# Patient Record
Sex: Male | Born: 1935
Health system: Southern US, Community
[De-identification: ages and names within clinical notes are randomized; demographics above are authoritative.]

## PROBLEM LIST (undated history)

## (undated) DIAGNOSIS — M1990 Unspecified osteoarthritis, unspecified site: Secondary | ICD-10-CM

## (undated) DIAGNOSIS — K219 Gastro-esophageal reflux disease without esophagitis: Secondary | ICD-10-CM

## (undated) DIAGNOSIS — J45909 Unspecified asthma, uncomplicated: Secondary | ICD-10-CM

## (undated) DIAGNOSIS — J189 Pneumonia, unspecified organism: Secondary | ICD-10-CM

## (undated) DIAGNOSIS — E785 Hyperlipidemia, unspecified: Secondary | ICD-10-CM

## (undated) DIAGNOSIS — Z7901 Long term (current) use of anticoagulants: Secondary | ICD-10-CM

## (undated) DIAGNOSIS — I824Y1 Acute embolism and thrombosis of unspecified deep veins of right proximal lower extremity: Secondary | ICD-10-CM

## (undated) DIAGNOSIS — I10 Essential (primary) hypertension: Secondary | ICD-10-CM

## (undated) DIAGNOSIS — Z87442 Personal history of urinary calculi: Secondary | ICD-10-CM

## (undated) HISTORY — DX: Long term (current) use of anticoagulants: Z79.01

## (undated) HISTORY — PX: ELBOW BURSA SURGERY: SHX615

## (undated) HISTORY — DX: Hyperlipidemia, unspecified: E78.5

## (undated) HISTORY — PX: TONSILLECTOMY: SUR1361

## (undated) HISTORY — PX: KIDNEY STONE SURGERY: SHX686

## (undated) HISTORY — DX: Gastro-esophageal reflux disease without esophagitis: K21.9

## (undated) HISTORY — DX: Essential (primary) hypertension: I10

## (undated) HISTORY — PX: JOINT REPLACEMENT: SHX530

## (undated) HISTORY — DX: Acute embolism and thrombosis of unspecified deep veins of right proximal lower extremity: I82.4Y1

---

## 1999-09-02 ENCOUNTER — Ambulatory Visit (HOSPITAL_COMMUNITY): Admission: RE | Admit: 1999-09-02 | Discharge: 1999-09-02 | Payer: Self-pay | Admitting: Internal Medicine

## 1999-09-02 ENCOUNTER — Encounter: Payer: Self-pay | Admitting: Internal Medicine

## 2001-04-01 ENCOUNTER — Encounter (INDEPENDENT_AMBULATORY_CARE_PROVIDER_SITE_OTHER): Payer: Self-pay | Admitting: Specialist

## 2001-04-01 ENCOUNTER — Other Ambulatory Visit: Admission: RE | Admit: 2001-04-01 | Discharge: 2001-04-01 | Payer: Self-pay | Admitting: Gastroenterology

## 2001-10-19 ENCOUNTER — Encounter: Payer: Self-pay | Admitting: Internal Medicine

## 2001-10-19 ENCOUNTER — Encounter: Admission: RE | Admit: 2001-10-19 | Discharge: 2001-10-19 | Payer: Self-pay | Admitting: Internal Medicine

## 2005-06-23 ENCOUNTER — Inpatient Hospital Stay (HOSPITAL_COMMUNITY): Admission: RE | Admit: 2005-06-23 | Discharge: 2005-06-24 | Payer: Self-pay | Admitting: Urology

## 2005-06-23 ENCOUNTER — Encounter (INDEPENDENT_AMBULATORY_CARE_PROVIDER_SITE_OTHER): Payer: Self-pay | Admitting: *Deleted

## 2006-03-23 ENCOUNTER — Ambulatory Visit (HOSPITAL_BASED_OUTPATIENT_CLINIC_OR_DEPARTMENT_OTHER): Admission: RE | Admit: 2006-03-23 | Discharge: 2006-03-23 | Payer: Self-pay | Admitting: Urology

## 2006-06-10 ENCOUNTER — Ambulatory Visit: Payer: Self-pay | Admitting: Internal Medicine

## 2006-06-16 ENCOUNTER — Ambulatory Visit: Payer: Self-pay | Admitting: Cardiology

## 2006-06-23 ENCOUNTER — Ambulatory Visit: Payer: Self-pay | Admitting: Internal Medicine

## 2006-06-23 ENCOUNTER — Encounter (INDEPENDENT_AMBULATORY_CARE_PROVIDER_SITE_OTHER): Payer: Self-pay | Admitting: Specialist

## 2006-07-02 ENCOUNTER — Ambulatory Visit: Payer: Self-pay | Admitting: Cardiology

## 2006-07-02 ENCOUNTER — Ambulatory Visit: Payer: Self-pay

## 2006-07-02 ENCOUNTER — Encounter: Payer: Self-pay | Admitting: Cardiology

## 2006-12-31 ENCOUNTER — Encounter: Admission: RE | Admit: 2006-12-31 | Discharge: 2006-12-31 | Payer: Self-pay | Admitting: Orthopedic Surgery

## 2007-02-06 ENCOUNTER — Ambulatory Visit: Payer: Self-pay | Admitting: *Deleted

## 2007-02-06 ENCOUNTER — Inpatient Hospital Stay (HOSPITAL_COMMUNITY): Admission: AD | Admit: 2007-02-06 | Discharge: 2007-02-11 | Payer: Self-pay | Admitting: Internal Medicine

## 2007-02-07 ENCOUNTER — Ambulatory Visit: Payer: Self-pay | Admitting: Infectious Disease

## 2008-06-01 HISTORY — PX: FOOT SURGERY: SHX648

## 2008-06-07 ENCOUNTER — Ambulatory Visit: Payer: Self-pay | Admitting: Cardiology

## 2008-06-08 ENCOUNTER — Ambulatory Visit: Payer: Self-pay

## 2008-06-18 ENCOUNTER — Encounter: Admission: RE | Admit: 2008-06-18 | Discharge: 2008-06-18 | Payer: Self-pay | Admitting: Orthopedic Surgery

## 2008-06-20 ENCOUNTER — Ambulatory Visit (HOSPITAL_BASED_OUTPATIENT_CLINIC_OR_DEPARTMENT_OTHER): Admission: RE | Admit: 2008-06-20 | Discharge: 2008-06-21 | Payer: Self-pay | Admitting: Orthopedic Surgery

## 2008-09-03 ENCOUNTER — Encounter: Admission: RE | Admit: 2008-09-03 | Discharge: 2008-09-18 | Payer: Self-pay | Admitting: Orthopedic Surgery

## 2008-10-15 ENCOUNTER — Ambulatory Visit: Payer: Self-pay | Admitting: Internal Medicine

## 2008-10-30 ENCOUNTER — Ambulatory Visit: Payer: Self-pay | Admitting: Internal Medicine

## 2008-11-27 ENCOUNTER — Ambulatory Visit: Payer: Self-pay | Admitting: Internal Medicine

## 2008-12-07 ENCOUNTER — Ambulatory Visit: Payer: Self-pay | Admitting: Internal Medicine

## 2009-01-07 ENCOUNTER — Ambulatory Visit: Payer: Self-pay | Admitting: Internal Medicine

## 2009-02-26 ENCOUNTER — Ambulatory Visit: Payer: Self-pay | Admitting: Internal Medicine

## 2009-04-01 HISTORY — PX: CATARACT EXTRACTION W/ INTRAOCULAR LENS  IMPLANT, BILATERAL: SHX1307

## 2009-04-05 ENCOUNTER — Ambulatory Visit: Payer: Self-pay | Admitting: Internal Medicine

## 2009-05-13 ENCOUNTER — Ambulatory Visit: Payer: Self-pay | Admitting: Internal Medicine

## 2009-05-22 ENCOUNTER — Encounter (INDEPENDENT_AMBULATORY_CARE_PROVIDER_SITE_OTHER): Payer: Self-pay | Admitting: *Deleted

## 2009-06-06 ENCOUNTER — Ambulatory Visit: Payer: Self-pay | Admitting: Internal Medicine

## 2009-06-21 ENCOUNTER — Ambulatory Visit: Payer: Self-pay | Admitting: Internal Medicine

## 2009-07-25 ENCOUNTER — Ambulatory Visit: Payer: Self-pay | Admitting: Internal Medicine

## 2009-08-27 ENCOUNTER — Ambulatory Visit: Payer: Self-pay | Admitting: Internal Medicine

## 2009-08-27 ENCOUNTER — Encounter: Admission: RE | Admit: 2009-08-27 | Discharge: 2009-08-27 | Payer: Self-pay | Admitting: Internal Medicine

## 2009-11-12 ENCOUNTER — Ambulatory Visit: Payer: Self-pay | Admitting: Internal Medicine

## 2010-01-13 ENCOUNTER — Telehealth: Payer: Self-pay | Admitting: Internal Medicine

## 2010-03-06 ENCOUNTER — Ambulatory Visit: Payer: Self-pay | Admitting: Internal Medicine

## 2010-03-18 ENCOUNTER — Ambulatory Visit: Payer: Self-pay | Admitting: Internal Medicine

## 2010-04-01 ENCOUNTER — Ambulatory Visit: Payer: Self-pay | Admitting: Internal Medicine

## 2010-07-01 NOTE — Progress Notes (Signed)
Summary: Schedule ECL  Phone Note Outgoing Call Call back at [facility] Phone [phone]   Call placed by: Bernita Buffy CMA Deborra Medina),  January 13, 2010 1:19 PM Call placed to: Patient Summary of Call: Left message on patients machine to call back. patient is due for a ECL Initial call taken by: Bernita Buffy CMA Deborra Medina),  January 13, 2010 1:19 PM  Follow-up for Phone Call        patient states that he is going to be out of town and will call back to schedule. Follow-up by: Bernita Buffy CMA Deborra Medina),  January 20, 2010 9:25 AM

## 2010-08-11 ENCOUNTER — Encounter: Payer: Self-pay | Admitting: Internal Medicine

## 2010-09-15 LAB — BASIC METABOLIC PANEL
BUN: 19 mg/dL (ref 6–23)
CO2: 28 mEq/L (ref 19–32)
GFR calc Af Amer: >60 mL/min (ref >60)
GFR calc Af Amer: >60 mL/min (ref >60)
GFR calc non Af Amer: >60 mL/min (ref >60)
Glucose, Bld: 179 mg/dL — ABNORMAL HIGH (ref 70–99)
Sodium: 135 mEq/L (ref 135–145)

## 2010-10-14 NOTE — Assessment & Plan Note (Signed)
[facility] OFFICE NOTE   MAHDI, HALTIWANGER                     MRN:          [id number]  DATE:06/07/2008                            DOB:          [birth year]    Mr. Foutch is here for a followup.  Specifically, he was scheduled to  have right first and second transmetatarsal fusion, bunion resection,  and hammer toe correction.  This procedure was scheduled as a 2-1/2 hour  procedure.  They obtained an electrocardiogram, which demonstrated right  bundle-branch block, a new finding from before.  They asked Dr. Caryl Comes  about it, and he told them to cancel the surgery and schedule up an  appointment to see me.  Other than the foot, the patient has been  getting along well.  He has had no major symptoms.  He has not had chest  pain.  He did have a brief episode of dizziness approximately 3 days  ago, but it quickly resolved.  The exact etiology of this was unknown.  The patient has been previously   INCOMPLETE DICTATION.     Loretha Brasil. Lia Foyer, MD, [facility]     TDS/MedQ  DD: 06/14/2008  DT: 06/14/2008  Job #: [id number]

## 2010-10-14 NOTE — Op Note (Signed)
Roberto Reid, Roberto Reid              ACCOUNT NO.:  [id number]   MEDICAL RECORD NO.:  [id number]          PATIENT TYPE:  AMB   LOCATION:  [place]                          FACILITY:  [facility]   PHYSICIAN:  Weber Cooks, M.D.     DATE OF BIRTH:  Jan 31, 1933   DATE OF PROCEDURE:  06/20/2008  DATE OF DISCHARGE:                               OPERATIVE REPORT   PREOPERATIVE DIAGNOSES:  1. 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  2. Right second tarsometatarsal joint posttraumatic arthritis.  3. Right tight gastroc.  4. Right hallux valgus.  5. Right second metatarsalgia.  6. Right second hammer toe.   POSTOPERATIVE DIAGNOSES:  1. 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  2. Right second tarsometatarsal joint posttraumatic arthritis.  3. Right tight gastroc.  4. Right hallux valgus.  5. Right second metatarsalgia.  6. Right second hammer toe.   OPERATIONS:  1. Right first TMT joint fusion with osteotomy.  2. Right local bone graft.  3. Stress x-rays, right foot.  4. Right second TMT joint fusion without osteotomy.  5. Right gastroc slide.  6. Right modified McBride bunionectomy.  7. Right great toe digital nerve neurolysis.  8. Right second Weil metatarsal shortening osteotomy.  9. Right second toe MTP joint capsulotomy with collateral release.  10.Right second toe proximal phalanx head resection.  11.Right second toe FDL to proximal phalanx tendon transfer.  12.Right second toe EDB to EDL tendon transfer.   ANESTHESIA:  General.   SURGEON:  Weber Cooks, MD   ASSISTANT:  Erskine Emery, PA-C.   TOURNIQUET TIME:  Two hours.   COMPLICATIONS:  None.   DISPOSITION:  Stable to the PR.   INDICATIONS:  This is a 77 year old male with longstanding right  forefoot pain and it was interfering with his life to the point of where  he could not  do what he wanted to do after being stepped on by a horse  several years ago.  He was consented to the above procedure.  All risks  including infection, vessel injury, nonunion, malunion, hardware tissue,  hardware failure, persistent pain, worse pain, prolonged recovery,  stiffness, arthritis, recurrence of hallux valgus, development of hallux  varus and development of cock-up toe deformity were all explained.  Questions were encouraged and answered.   OPERATION:  The patient was brought to the operating room, placed in  supine position.  After adequate general endotracheal anesthesia was  administered as well as Ancef 1 g IV piggyback, the right lower  extremity was then prepped, draped in sterile manner, proximally thigh  tourniquet.  A longitudinal incision was made over the medial aspect of  gastrocnemius muscle tendinous junction.  Dissection was carried down  through skin.  Hemostasis was obtained.  Fascia was opened in line with  the incision.  Conjoined region was then developed between the gastroc  soleus.  Soft tissue was then elevated off the posterior aspect of the  gastrocnemius.  Sural nerves identified and protected posteriorly.  Gastrocnemius then released with a curved Mayo scissors.  This had  excellent release of the gastrocnemius.  The areas copiously irrigated  with normal saline.  Subcu was closed with 3-0 Vicryl.  Skin was closed  4-0 nylon.  Sterile dressing was applied.  The wound was closed with 4-0  nylon stitch.  The limb was then gravity exsanguinated.  Tourniquet  elevated to 290 mmHg and a midline incision was made medial to the right  great toe MTP joint.  Dissection was carried down through skin.  Hemostasis was obtained.  The dorsomedial digital nerve of the great toe  was then carefully dissected out and a formal digital nerve neurolysis  then performed of the nerve.  Once this was retracted of harm's way  throughout the case, an L-shaped capsulotomy  was then made.  Simple  bunionectomy was then performed with a sagittal saw.  Yvone Neu Johnson's  ridge was then rounded off with a rongeur.  Lateral capsule was then  released with a curved Beaver blade protecting the cartilaginous surface  with a Soil scientist.  This had an external release of the tight  capsule.  We then copiously irrigated the joint area with normal saline,  and we will return back later to reconstruct the capsule, once the first  and second TMT joint fusions were performed.  We then made a  longitudinal incision midline between EHL and EHB.  Dissection was  carried down through the skin.  Hemostasis was obtained.  First and  second TMT joints were entered by a soft tissue elevation off the dorsal  surface and then entering the joints each respectively.  There was gross  deformity to the first TMT joint with a dysplastic surface to the medial  cuneiform.  I removed the cartilage were both articular surfaces of the  first and second TMT joints respectively using curved corners osteotome  curette as well as synovectomy rongeur.  Once this was completely  denuded of cartilage, we then using a sagittal saw create an osteotomy  closing wedge type of the medial cuneiform.  We also did osteotomy at  the base of the first metatarsal to create congruent joint surfaces.  This was a multiplantar osteotomy not only in valgus, but also for  slight plantar flexion as well.  Once this was done, multiple 2-mm drill  holes were placed on either side of the joints of the first and second  TMT joints respectively and the base of second medially as well as the  lateral aspect of the medial cuneiform.  We then reduced the first TMT  joint with a two-point reduction clamp, and we then used a burr to  create a notch of the base of first metatarsal.  We then placed a 3.5-mm  fully threaded cortical lag screw using a 2.5 mm drill hole  respectively, this had excellent purchase maintenance of the  correction.  We then placed cramp from the base of second metatarsal to the medial  cuneiform and reduced the second TMT joint and placed this under  advanced compression.  We then placed an incision over the medial aspect  of the medial cuneiform and then placed a 3.5-mm fully-threaded cortical  set screw using a 2.5-mm drill hole respectively, this had excellent  purchase and maintenance of the compression across the second TMT joint  fusion site.  We then made another incision at the base of first  metatarsal.  We then placed a 3.5-mm fully-threaded cortical D  rotational screw/compression  screw using a 2.5-mm hole respectively  after two-point reduction clamp was applied.  This had excellent  purchase and maintenance of the compression and alignment of the  corrected first TMT joint.  We then obtained stress x-rays in AP and  lateral planes.  No gross motion, fixation, proposition, excellent  alignment as well.  We then applied stress strain relieving local bone  graft at the first and second TMT joint using a burr as well as the  graft obtained throughout the procedure including from the spurs as well  as from the drill bits.  Once we completed the first and second TMT  joint fusion and applied the graft and performed a stress x-rays, we  then made a longitudinal incision on dorsal aspect of the right second  toe.  Dissection was carried down through the skin.  Hemostasis was  obtained.  EDB and EDL tendons were identified and EDL tendon was  tenotomized proximal medial and the brevis distal lateral and retracted  out of harm's way throughout the procedure.  MTP joint dorsal  capsulotomy with collateral release was then performed with a 15-blade  scalpel protecting the soft tissues both medial and laterally.  We then  skeletonized the distal aspect of the second proximal phalanx and then  with a rongeur followed by bone cutter performed a proximal phalanx head  resection.  This cut  was made perpendicular to long axis proximal  phalanx.  We then made a longitudinal incision into the base of plantar  plate.  We then identified the FPL tendon tenotomized this as distal as  possible.  We then plantar flexed this second toe and with stacked  sagittal saw blades performed a Weil metatarsal shortening osteotomy  osteotomizing the metatarsal head parallel to the plantar aspect of the  foot.  The head was then translated 3-4 mm proximally and fixed with a  1.5-mm fully-threaded cortical set screw using 1.1-mm drill hole  respectively.  This had excellent purchase and maintenance of the  correction.  Redundant bone ledge dorsally was trimmed off with a  rongeur.  Bone wax was applied to expose bone surfaces.  We then  transferred the FDL to proximal phalanx using 3-0 Maxon stitch.  This  had an Systems analyst.  We then placed a 0.054 K-wire antegrade  through the middle distal phalanx reduced PIP joint and fired this  retrograde across the MTP joint.  This held the toe in excellent  position.  We then transferred the EDB to EDL tendon dorsally using  again 3-0 PDS stitch.  This had an Systems analyst.  Tourniquet was  applied.  Hemostasis was obtained.  Toes pinked up nicely.  Skin  relieving incisions were made on either side of K-wire.  K-wire was  bent, cut, and capped.  Subcu was closed with 3-0 Vicryl.  The wounds  were copiously with normal saline.  Subcu was closed with 3-0 Vicryl.  Skin was closed with 4-0 nylon over all wounds.  Sterile dressing  applied, modified Jones dressing was applied.  The patient was stable to  the PR.      Weber Cooks, M.D.  Electronically Signed     PB/MEDQ  D:  06/20/2008  T:  06/21/2008  Job:  [id number]

## 2010-10-14 NOTE — Assessment & Plan Note (Signed)
Roberto Reid OFFICE NOTE   ARBOR, BOLOSAN                     MRN:          [id number]  DATE:06/07/2008                            DOB:          18-Jul-1932    Roberto Reid is here.  He was scheduled to undergo foot surgery  consisting of transmetatarsal fusions, bunion and hammertoe correction.  He had an EKG done at [facility], and this was faxed to  Dr. Caryl Reid, who was doctor of the day.  He encouraged them to cancel this  procedure, and have him see me in follow-up.  I last saw Roberto Reid in  January 2008.  The patient overall has done well.  He had very brief  episode of dizziness  a couple of days ago.  But actually has been  feeling well.  Has had no real syncope or presyncope.  The EKG  shows a  right bundle branch block.  Previous tracings done here in 2007 did not  demonstrate right bundle branch block in the past.  The patient has had  previous radionuclide exercise imaging here.  This also has not  demonstrated exercise-induced bundle branch block previously.  As a  result, he was brought back in for preoperative evaluation.  His overall  ability to do things is reasonably good.  He does not have chest pain.  He has no heart failure symptoms.  He has not had a significant murmur  in the past.  In fact, the patient last had 2-D echocardiography in  February 2008.  At that time he had a mildly calcified aortic valve with  normal regional wall motion.  There was mild increase in wall thickness.  There was fibrocalcific change in the aortic root and mild mitral  annular calcification, otherwise unremarkable tracings.   PAST MEDICAL HISTORY:  Past medical history is remarkable for:  1. Hypertension.  2. Hyperlipidemia.   ALLERGIES:  SULFA.   MEDICATIONS:  1. Lipitor 40 mg daily.  2. Amlodipine 5 mg daily.  3. Zetia 10 mg daily.  4. Nexium 40 mg daily.  5. Hyzaar 100/25 daily.  6.  Potassium 2 tablets three times a day.   FAMILY HISTORY:  His family history is remarkable for diabetes in his  father.  Otherwise is negative for heart disease.   SOCIAL HISTORY:  The patient is married.  He is two children.  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  He is not a current smoker.  He does not chew tobacco.  He only drinks socially.   REVIEW OF SYSTEMS:  The patient did have some shortness of breath  approximately 2 years ago.  That has largely resolved.   PHYSICAL EXAMINATION:  GENERAL:  Alert and oriented in no distress.  VITAL SIGNS:  The blood pressure is 150/78, pulse was 89.  NECK:  The jugular veins are not distended.  The neck is supple.  Carotid upstrokes are brisk.  LUNGS:  The lungs are clear to auscultation and percussion.  CARDIAC:  PMI  is nondisplaced.  There is an S4 gallop.  EXTREMITIES:  There is no obvious extremity edema.   EKG reveals sinus rhythm with right bundle branch block.   IMPRESSION:  1. New right bundle branch block by electrocardiography.  2. History of hypertension  3. Hypercholesterolemia.   RECOMMENDATIONS:  1. There are no specific contraindications to proceeding with surgery      at the present time.  He does have a new right bundle branch block      and has some concern.  2. It would be reasonable to obtain one additional radionuclide      imaging study.  I discussed this with him in detail, telling him      about the desire to limit long-term radiation with these findings.      However, it would be reasonable to consider this and he would like      to pursue this further.  This will be arranged for the next 1-2      days.     Roberto Reid. Roberto Foyer, MD, [facility]  Electronically Signed    TDS/MedQ  DD: 06/14/2008  DT: 06/14/2008  Job #: [id number]

## 2010-10-14 NOTE — Assessment & Plan Note (Signed)
[facility] OFFICE NOTE   Roberto Reid, Roberto Reid                       MRN:          [id number]  DATE:06/08/2008                            DOB:          [birth year]    [name] underwent radionuclide imaging study.  He exercised for 5  minutes and did not have significant EKG changes.  While he does have  right bundle-branch block at baseline, there was a rare premature atrial  contraction, no significant arrhythmias, no significant focal ST  depression was noted.  Radionuclide imaging revealed ejection fraction  of 79% what was thought to be normal perfusion and mild soft tissue  attenuation in the diaphragm.  No high-grade ischemia was noted.  The  patient should be stable for proceeding with his surgery.     Loretha Brasil. Lia Foyer, MD, [facility]  Electronically Signed    TDS/MedQ  DD: 06/14/2008  DT: 06/14/2008  Job #: [id number]

## 2010-10-17 NOTE — Op Note (Signed)
Roberto Reid, Roberto Reid              ACCOUNT NO.:  [id number]   MEDICAL RECORD NO.:  [id number]          PATIENT TYPE:  AMB   LOCATION:  [facility]                         FACILITY:  [facility]   PHYSICIAN:  Marshall Cork. Jeffie Pollock, M.D.    DATE OF BIRTH:  1932-06-18   DATE OF PROCEDURE:  03/23/2006  DATE OF DISCHARGE:                                 OPERATIVE REPORT   PREOPERATIVE DIAGNOSIS:  Uric acid renal and ureteropelvic junction stones  on the right.   POSTOPERATIVE DIAGNOSIS:  Uric acid right renal and midureteral stone.   PROCEDURES:  Cystoscopy, right retrograde pyelogram with interpretation,  ureteroscopy with holmium laser tripsy and stone removal, and placement of  right double-J stent.   SURGEON:  Marshall Cork. Jeffie Pollock, M.D.   ANESTHESIA:  General.   SPECIMEN:  Stone fragments.   DRAIN:  6-French x 24 cm double-J stent.   COMPLICATIONS:  None.   INDICATIONS:  Roberto Reid is a 77 year old white male with a history of uric  acid urolithiasis, who previously undergone a percutaneous nephrolithotomy  for the uric acid stones.  He was placed on alkalinization but had rapid  recurrence with a large stone volume noted only 8 months after his previous  percutaneous procedure.  There were both stones at the right UPJ and the  right lower pole.  It was felt that ureteroscopy with laser tripsy was  indicated.   FINDINGS AND PROCEDURE:  The patient was given Cipro.  He was taken to the  operating room, where a general anesthetic was induced.  He was placed in  lithotomy position.  His perineum and genitalia were prepped with Betadine  solution and he was draped in the usual sterile fashion.  Cystoscopy was  performed using a 22-French scope and the 12 and 70 degrees lenses.  Examination revealed a normal urethra.  The external sphincter was intact.  The prostatic urethra was short with bilobar hyperplasia and mild  obstruction.  Examination of the bladder revealed minimal to mild  trabeculation.  No  tumor, stones or inflammation were noted.  Ureteral  orifices were unremarkable.   The right ureteral orifice was cannulated with a 5-French open-end catheter  and contrast was instilled.   Retrograde pyelogram demonstrated a normal ureter to the level of the  vessels, where a filling defect was noted consistent with the stone that had  previously been at the UPJ.  Contrast did not readily flow beyond the stone.   At this point a guidewire was passed to the level of the stone and was  negotiated by to the kidney.  I then attempted to pass a 12-French  introducer sheath dilator to the level the kidney, but I was unable to get  it by the meatus.   The balloon dilation catheter, 10 cm 15-French, was then inserted to the  level of the stone and inflated.  There was a waist noted at the meatus that  dissipated with pressure.  Once the distal ureter been dilated, the 6-French  short ureteroscope was then passed.  The stone was visualized readily, but I  could not get  an angle on it to treat it with lithotripsy.   At this point I replaced the 12-French dilator all the way to the kidney  level.  I then reassembled it and replaced over the wire with the access  sheath in place.  I was unable to get it all the way into the kidney, so I  removed the dilator core and placed a guidewire to the level of kidney and  dilated the UPJ with a balloon dilator.  I was then able to advance the  introducer sheath to the kidney.   At this point a 6-French flexible ureteroscope was inserted.  The stones  were readily visualized in the lower pole of the kidney.  There were some  Randall plaques in the upper pole but no free-floating stones.  There were  approximately three large stones in the lower pole of the kidney.  These  were then engaged with the 0.275 laser fiber on 0.5 watts and 5 Hz.  The  stones were then fragmented into 2-3 mm fragments over a period of  approximately 40 minutes.  Once I felt that  I had accomplished as much  fragmentation is possible, the laser fiber was removed and a nitinol basket  was used to remove several representative fragments.  Once the fragments  were removed and I determined that the size was 2-3 mm, I felt that the  patient should be able to pass the remaining material.   At this point the ureteroscope was removed under direct vision along with  the access sheath to ensure that no ureteral fragments remained; none were  noted.  The guidewire was left at the kidney level.  A cystoscope was  reinserted over the guidewire, an open-end catheter was placed over the  wire, and the wire was removed.  Contrast was instilled and an image of the  kidney was obtained.  This revealed multiple fragments in the lower pole of  the kidney.  All were felt to be in an acceptable size range.  There was a  single filling defect in the renal pelvis that was either air or possibly a  stone fragment, although I felt air was more likely.   The open-end catheter was then removed and a 6-French 24, cm double-J stent  was inserted over the wire under fluoroscopic guidance.  The wire was  removed leaving a good coil in the kidney and a good coil in the bladder.  The bladder was then drained.  The patient was taken down from lithotomy  position.  His anesthetic was reversed.  He was moved to the recovery room  in stable condition.  There were no complications.      Marshall Cork. Jeffie Pollock, M.D.  Electronically Signed     JJW/MEDQ  D:  03/23/2006  T:  03/24/2006  Job:  [id number]   cc:   Cresenciano Lick. Renold Genta, M.D.  Fax: [phone]

## 2010-10-17 NOTE — Discharge Summary (Signed)
NAMESAAKETH, GOODLET              ACCOUNT NO.:  [id number]   MEDICAL RECORD NO.:  [id number]          PATIENT TYPE:  INP   LOCATION:  1009                         FACILITY:  [facility]   PHYSICIAN:  Marshall Cork. Jeffie Pollock, M.D.    DATE OF BIRTH:  1932/06/08   DATE OF ADMISSION:  06/22/2005  DATE OF DISCHARGE:  06/24/2005                                 DISCHARGE SUMMARY   DISCHARGE DIAGNOSES:  1.  Right pelvic kidney stones.  2.  History of gout.   PROCEDURE:  Right percutaneous nephrolithotomy.   SURGEON:  Marshall Cork. Jeffie Pollock, M.D.   CONSULTS:  Venetia Night. Kathlene Cote, M.D., interventional radiology.   HOSPITAL COURSE:  Patient was admitted and taken to the operating room on  June 22, 2005.  Underwent the above-named procedure, which he tolerated  without complication.  For a complete description of these procedures,  please consult the operative note.  Postoperatively, he was taken to the  urology floor in stable condition, where he remained throughout his  hospitalization.  His urinary tract was managed with a Foley catheter, which  was removed on postoperative day #1.  He successfully passed a trial of  void.  He also had percutaneous nephrostomy tubes, which were left to  straight drainage.   On postoperative day #1, he underwent a CT stone study, which demonstrated  no residual stone fragments in his right collecting system; however, because  of some nausea and right-sided flank pain, the patient was observed until  postoperative day #2.  His course was uncomplicated and consisted of  progressive toleration of ambulation, pain and diet.   On the morning of postoperative day #2, it was determined that he was in  stable condition to be discharged home.   PHYSICAL EXAMINATION AT DISCHARGE:  VITAL SIGNS:  Temperature 97 with stable  vital signs.  GENERAL:  This is a 77 year old Caucasian male in no acute distress.  ABDOMEN:  Abdomen is soft, nondistended, and nontender to palpation without  costovertebral angle tenderness.  There is a minimal amount of right flank  pain over his percutaneous nephrostomy tube site.  These tubes are in place  and are draining well.  They are draining light pink urine.  For the  remainder of the physical exam, please consult admission H&P, as it is  unchanged.   DISPOSITION:  To home.   DISCHARGE INSTRUCTIONS:  Patient is scheduled to follow up with Dr. Jeffie Pollock  for followup and removal of his nephrostomy tubes.  He is instructed to call  or return.  If he has acute decrease in output from his tubes or if he has  any drainage around his tubes, he is given instructions for overnight bag  and leg bag.  He understands and agrees with these instructions.   DISCHARGE MEDICATIONS:  1.  Vicodin.  2.  Colace.  3.  Ciprofloxacin.     ______________________________  Guillermina City, MD      Marshall Cork. Jeffie Pollock, M.D.  Electronically Signed    MT/MEDQ  D:  06/24/2005  T:  06/24/2005  Job:  [id number]

## 2010-10-17 NOTE — Assessment & Plan Note (Signed)
[place] OFFICE NOTE   ELEAN, MITTELMAN                     MRN:          [id number]  DATE:06/16/2006                            DOB:          [birth year]    Mr. Arcuri is in for followup.  In general he has been stable.  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  He does continue to work  however.  He has had some moderate shortness of breath associated  predominantly with after eating.  He denies significant chest pain  associated with this.   MEDICATIONS:  1. Nexium 40 mg daily.  2. Zetia 10 mg daily.  3. Lipitor 40 mg daily.  4. Amlodipine 5 mg daily.  5. Potassium twice daily.   EXAMINATION:  Blood pressure 146/92, pulse 74.  Blood pressure repeat by  me was 155/90.  LUNGS:  Fields are clear.  CARDIAC:  Rhythm was regular.   EKG reveals normal sinus rhythm with an occasional premature  supraventricular beat.   RECOMMENDATIONS:  1. I would recommend a stress test associated with mycoardial      perfusion imaging to assess overall myocardial perfusion.  2. I would recommend getting a 2D echocardiographic study with his      overall LV function to assess this.  3. Would recommend a regular exercise walking program.     Loretha Brasil. Lia Foyer, MD, [facility]  Electronically Signed    TDS/MedQ  DD: 07/12/2006  DT: 07/12/2006  Job #: [id number]

## 2010-10-17 NOTE — Op Note (Signed)
Roberto Reid, Roberto Reid              ACCOUNT NO.:  [id number]   MEDICAL RECORD NO.:  [id number]          PATIENT TYPE:  INP   LOCATION:  1009                         FACILITY:  [facility]   PHYSICIAN:  Marshall Cork. Jeffie Pollock, M.D.    DATE OF BIRTH:  Dec 06, 1932   DATE OF PROCEDURE:  06/22/2005  DATE OF DISCHARGE:                                 OPERATIVE REPORT   PREOPERATIVE DIAGNOSIS:  Right kidney stones.   POSTOPERATIVE DIAGNOSIS:  Right kidney stones.   PROCEDURE:  Right PCNL.   SURGEON:  Marshall Cork. Jeffie Pollock, M.D.   ASSISTANT:  1.  Venetia Night. Kathlene Cote, M.D.  2.  Guillermina City, M.D.   SPECIMENS:  Multiple renal stone fragments.   DRAINS:  1.  Two 24-French Ainsworth catheters.  2.  Angiographic safety catheter.   DESCRIPTION OF PROCEDURE:  Preprocedure the patient was seen in the  interventional fluoroscopy suite and two access points were obtained under  ultrasound guidance. The patient was then brought to room 10 where he  received preprocedure antibiotics and was given general anesthesia. He was  prepped and draped in the usual sterile fashion for PCNL taking great care  to minimize the risk of peripheral neuropathy or compartment syndrome. Next,  Dr. Kathlene Cote was able to dilate our lower pole access point. For a complete  description of this procedures as well as the access procedure, please  consult the operative note. Next, we inserted a rigid nephroscope through  the access sheath and we immediately visualized two large pelvic stones.  Using the lithoclast ultra, we used ultrasound and pneumatic modes to  fragment the two large stones into numerous smaller pieces which were  grasped with the grasping device and removed out the sheath. Gradually, we  were able to remove the majority of these fragments except for very small  less than 1-2 mm fragments. We next consulted our preprocedure imaging and  noted there were three stones, one being in the upper pole calix, we removed  the rigid  nephroscope and inserted a flexible nephroscope and were able to  locate a large 1-2 cm stone in the upper pole. Next, we turned the procedure  back over to Dr. Kathlene Cote who dilated the access point on thus upper pole  calix and for a description of this procedure, please consult the operative  note. Next, through the access sheath, we placed our nephroscope and  immediately visualized the aforementioned stone which was again fragmented  and removed with the lithoclast ultra and the grasping device as detailed  above. Next, we thoroughly inspected the entire collecting system with a  combination of rigid and flexible scopes. The only fragments left were  several small less than 1-2 mm fragments. Using an Asepto and sterile  saline, we irrigated through the upper pole access point with irrigant  coming down the ureter and out the lower pole point, we did this several  times  and this irrigated the entire system nicely and on reinspection no  fragments were visualized. Next, we placed a 24 [place] Ainsworth catheter  over our working wire on the lower pole  access point and inverted it under  direct fluoroscopic guidance into the renal pelvis. 3 mL of sterile water  were placed through the valve stem and we reconfirmed the position under  direct fluoroscopic guidance. Next, the access sheath was backed over the  catheter, split with scissors and removed in its entirety. Next, the Foley  catheter was reconfirmed by shooting a limited antegrade nephrostogram  confirming its placement in the renal pelvis. Next, we sutured it in place  with a 2-0 silk suture. Next using, the safety wire of the same lower pole  axis, we placed a angiograph catheter under fluoroscopic guidance to the  level of the urinary bladder. The wire was removed and the angiograph  catheter was secured into place to the level of the skin with a 2-0 silk  suture. Next, we turned our attention to the upper pole access point. We   removed the safety wire using the working wire through the sheath. We placed  a 24-French Ainsworth catheter to the level of the renal pelvis. Again, we  confirmed this under direct fluoroscopic guidance. 3 mL of sterile water  were put in the valve stem. We double checked the placement in the renal  pelvis by shooting a limited antegrade nephrostogram which showed the  catheter in the renal pelvis. Next, we back loaded the sheath over the  Foley, split it with a scissor and removed it in its entirety. Next we fixed  this catheter to the skin using 2-0 silk interrupted sutures. Next, the  catheters were attached to drainage bag, safety catheter was capped,  dressings were applied. The patient was reversed from his anesthesia which  he tolerated without complication. Please note Dr. Irine Seal was present  and participated in all aspects of this case.     ______________________________  Guillermina City, MD      Marshall Cork. Jeffie Pollock, M.D.  Electronically Signed    MT/MEDQ  D:  06/22/2005  T:  06/23/2005  Job:  [id number]

## 2010-10-17 NOTE — H&P (Signed)
NAMELIPA, GLEW              ACCOUNT NO.:  [id number]   MEDICAL RECORD NO.:  [id number]          PATIENT TYPE:  INP   LOCATION:  [place]                         FACILITY:  [facility]   PHYSICIAN:  Cresenciano Lick. Renold Genta, M.D.   DATE OF BIRTH:  07/16/1932   DATE OF ADMISSION:  02/06/2007  DATE OF DISCHARGE:  02/11/2007                              HISTORY & PHYSICAL   CHIEF COMPLAINT:  Painful right foot.   HISTORY OF PRESENT ILLNESS:  This 77 year old white male returned from a  beach trip in the late afternoon of February 02, 2007 and developed pain  and swelling of the right foot and ankle to the point he really could  not bear weight. He also developed a fever of 100.5 degrees orally with  shaking chills. I saw him in my office around 9:00 p.m. on February 02, 2007. He was given 1 gram IM Rocephin and started on Levaquin orally.  The patient defervesced after 48 hours but pain still persisted and he  was unable bear weight. Shaking chills, resolved. STAT CBC revealed a  white blood cell count of 9,700 on February 02, 2007. He has a history  of adult onset diabetes mellitus, hypertension, and hyperlipidemia. His  last hemoglobin A1C was 6.5% in July of this year. He was admitted today  for IV antibiotics, MRI of the foot, and possible debridement and  incision and drainage if needed, in addition to pain control.   ALLERGIES:  SULFA.   CURRENT MEDICATIONS:  Lipitor 40 mg daily, Nexium 40 mg daily for GE  reflux, Ambien CR 12.5 mg q.h.s. p.r.n. sleep, Hyzaar 100/25 1 tablet  daily, Zetia 10 mg daily, Norvasc 5 mg daily, p.r.n. Viagra 100 mg.   PAST MEDICAL HISTORY:  The patient had a colonoscopy earlier this year.  History of kidney stones, June 22, 2005 involving the right kidney.  The patient had surgery by Dr. Irine Seal and was diagnosed with uric  acid urolithiasis. The patient had surgery on the left knee for torn  cartilage by Dr. Onnie Graham of November 2005. Had tetanus  immunization  October 2004. Pneumovax immunization March 2003. Dr. Beola Cord saw him in  July of this year for right forefoot pain. 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 He had right hyper-mobile  hallux valgus. He had right second metatarsalgia secondary to Morton's  foot and a tight gastroc in second hammer toe. Therefore, surgery had  been planned for November 2008. The patient has a remote history of  cluster headaches, seen by Dr. Erling Cruz in 2001. The patient also had a  kidney stone in 1997 with a stone basket extraction. Fractured arm in  1942 with surgery to repair that fracture. Fracture of ankle in 1953  secondary to a football injury. Fractured ribs in 1954 and 1956.   SOCIAL HISTORY:  The patient has been married twice. He is a non-smoker  but formerly smoked and quit in 1990 after having smoked a pack a day  for 32 years.  Social alcohol consumption. 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 One  adult son and daughter. No children from second marriage.   FAMILY HISTORY:  Mother died at age 43, cause unknown. Father died at  age 22 of diabetes mellitus and Parkinsonism.   PHYSICAL EXAMINATION:  VITAL SIGNS:  Temperature 98.1 degrees orally.  Blood pressure 142/70, pulse 67, respiratory rate 20, O2 saturation on  room air 98%.  HEENT:  The patient has developed a herpetic lesion above his upper lip  today, complains of sores in his mouth. Does not look like obvious  aphthous lesion inside lower lip. TM's are clear.  NECK:  Supple. No carotid bruits or JVD.  CHEST:  Clear.  CARDIAC:  Regular rate and rhythm. Normal S1 and S2. Without murmurs.  ABDOMEN:  No hepatosplenomegaly, masses, or tenderness.  EXTREMITIES:  Right leg has developed a bullous lesion along the medial  malleolus, around 2 inches in length with surrounding  dark erythema,  extending to the foot and mid lower leg with edema.  NEUROLOGIC:  Examination is intact without focal deficits.   IMPRESSION:  1. Cellulitis right foot and lower leg with bullous lesion, medial      malleolus. This is very painful and he is unable to ambulate.  2. Adult onset diabetes mellitus.  3. Hypertension.  4. Hyperlipidemia.  5. Gastroesophageal reflux.  6. History of uric acid kidney stones.   PLAN:  Dr. Onnie Graham will see patient in consultation. Orthopedic  consultation. MRI and plain films will be obtained of the right foot and  leg. The patient will be put on DVT prophylaxis. He will also have a  Doppler of the right lower extremity, Infectious Disease consultation,  or antibiotic coverage.           ______________________________  Cresenciano Lick. Renold Genta, M.D.     MJB/MEDQ  D:  03/18/2007  T:  03/20/2007  Job:  [id number]   cc:   Cresenciano Lick. Renold Genta, M.D.  Fax: [phone]

## 2010-10-17 NOTE — Assessment & Plan Note (Signed)
Roberto Reid   Roberto Reid, Roberto Reid                     MRN:          [id number]  DATE:06/10/2006                            DOB:          07/18/1932    REFERRING PHYSICIAN:  Cresenciano Lick. Renold Genta, M.D.   REASON FOR CONSULTATION:  Dyspnea after eating.   ASSESSMENT:  74. A 77 year old white man with a longstanding history of postprandial      dyspnea on exertion. He does not seem to have this problem at other      times. Etiology is unclear.  2. There is a long history of acid reflux for over 20 years recently      controlled for the past three years on Nexium without dysphagia. He      is at risk of Barrett's esophagus.  3. History of adenomatous colon polyp removed by Dr. Lucio Edward      November 2002.   PLAN:  1. Schedule upper gastrointestinal endoscopy because of the chronic      reflux.  2. Schedule colonoscopy for polyp surveillance.  3. Regarding this dyspnea on exertion after eating, it is not clear      what to do. Possible causes would include coronary ischemia. I      doubt it would be a primary lung process. He did have a negative      stress test a few years ago and is due to see Dr. Addison Lank next      week. I wonder about the utility of a post prandial stress test to      try to determine if there is some sort of steal syndrome when blood      flow to the gut occurs such that he has compromised coronary      vascular flow and develops ischemia. This certainly would be      unusual and uncommon, but it is hard for me to really understand      the pathophysiology here. He does have some abdominal distention,      but he does not really have early satiety or significant bloating      or changes like that. Whatever this is it has been going on for a      long time and appears to be benign though he does Reid it as      worsening. He would like some relief.  4. Another thought would  be to consider trying nitroglycerin during      these episodes.  5. I will await Dr. Maren Beach opinion on this. I suspect he will      undergo some sort of stress testing or functional testing of the      heart given the symptoms, but we will see. Other possibilities do      include reflux causing this though I would expect it to occur      without exertion, and it does not seem to. The possibility of food      allergy again I would expect that to occur without dyspnea as well,  and he cannot pinpoint certain foods though he does notice it more      often when he goes on vacation at resorts where are hills, etc.,      and he has to walk up a hill.   HISTORY OF PRESENT ILLNESS:  This 77 year old white man has problems as  described above. He had a colonoscopy in 2002 and had a 5 mm adenoma  removed. He has not had followup since.   LABORATORY DATA:  Recently had laboratory testing June 03, 2006.  Hemoglobin 13.7, MCV 92, platelets 245. Glucose 141, BUN 28, creatinine  1.3; otherwise, normal.  CMET, cholesterol 156, triglycerides 198, HDL  46. Hemoglobin A1c was 6.1.   He had minimal voltage criteria for LVH on an EKG in October 2007.   MEDICATIONS:  1. Nexium 40 mg daily.  2. Zetia 10 mg daily.  3. Folbic daily.  4. Lipitor 40 mg daily.  5. Amlodipine 5 mg daily.  6. Potassium 108 mg daily.  7. Naprosyn p.r.n.   DRUG ALLERGIES:  SULFA.   PAST MEDICAL HISTORY:  1. Hypertension.  2. Obesity.  3. Dyslipidemia.  4. Colon polyp.  5. Uric acid nephrolithiasis followed by Dr. Jeffie Pollock.  6. Right elbow bursitis.   FAMILY HISTORY:  Father had diabetes. No colon cancer reported.   SOCIAL HISTORY:  He is married. He is a Teacher, English as a foreign language at [employer]. One son,  one daughter. Social alcohol. He quit smoking.   REVIEW OF SYSTEMS:  Eyeglasses or contact lenses are used. Allergies. He  has had some hematuria, problems with his kidney stones. He has had the  dyspnea problems as outlined.  All other systems are negative or as  reflected in my medical history form.   PHYSICAL EXAMINATION:  VITAL SIGNS:  Height 5 feet 10-1/2 inches, weight  224 pounds, blood pressure 130/60, pulse 88 regular.  HEENT:  Eyes anicteric. ENT:  Normal mouth, nose, and pharynx.  NECK:  Supple. No thyromegaly or mass.  CHEST:  Clear.  HEART:  S1, S2. I hear no rubs, murmurs or gallops. I see no jugular  venous distention.  ABDOMEN:  Obese, soft, nontender without organomegaly or mass.  RECTAL:  Deferred.  LYMPHATIC:  No neck or supraclavicular nodes.  EXTREMITIES:  No peripheral edema.  SKIN:  There are some signs of aging and sun damage but no acute rash.  It is warm and dry, otherwise.  PSYCHIATRIC:  He is alert and oriented x3.   I have reviewed the lab data sent by Dr. Renold Genta. Reid he had a CT of the  abdomen and pelvis. Follow up uric acid stones September 28, 2005 which  showed decrease in right renal calculi, a single gallstone but no other  significant findings.     Gatha Mayer, MD,FACG  Electronically Signed    CEG/MedQ  DD: 06/10/2006  DT: 06/10/2006  Job #: [id number]   cc:   Cresenciano Lick. Renold Genta, M.D.  Loretha Brasil. Lia Foyer, MD, [facility]

## 2010-10-17 NOTE — Discharge Summary (Signed)
Roberto Reid, Roberto Reid              ACCOUNT NO.:  [id number]   MEDICAL RECORD NO.:  [id number]          PATIENT TYPE:  INP   LOCATION:  [place]                         FACILITY:  [facility]   PHYSICIAN:  Cresenciano Lick. Renold Genta, M.D.   DATE OF BIRTH:  28-Dec-1932   DATE OF ADMISSION:  02/06/2007  DATE OF DISCHARGE:  02/11/2007                               DISCHARGE SUMMARY   FINAL DIAGNOSES:  1. Cellulitis, right leg and foot, possibly methicillin-resistant      Staphylococcus aureus.  2. Reactive right ankle effusion.  3. Hypertension.  4. Hyperlipidemia.  5. Adult-onset diabetes mellitus.  6. Mild volume depletion.  7. Intractable pain secondary to cellulitis.  8. Mild elevated liver functions, etiology unclear.   CONDITION:  Improved and stable.   DISCHARGE MEDICATIONS:  1. Zetia 10 mg daily.  2. Lipitor 40 mg daily.  3. Hyzaar 100/25 daily.  4. Norvasc 5 mg daily.  5. B6 50 mg daily while taking linezolid.  6. Nexium 40 mg daily.  7. Linezolid 600 mg p.o. q.12h. for 7 days for infection.  8. Lorcet 03/649 one p.o. q.6h. p.r.n. pain.  9. Ambien 10 mg h.s. p.r.n. sleep.   The patient is to dress the ankle wound twice a day with sterile  dressings and use Bactroban cream to that area b.i.d., return to see Dr.  Renold Genta February 17, 2007.   CONSULTANTS:  1. Dr. Onnie Graham, orthopedics.  2. Dr. Tommy Medal, infectious diseases.   BRIEF HISTORY:  This 77 year old white male returned from a beach trip  in the late afternoon of February 02, 2007, and developed pain and  swelling of the right foot and ankle to the point he really could not  bear weight.  He developed a fever of 100.5 degrees orally with shaking  chills.  I saw him in the office around 9 p.m. February 02, 2007, and  gave him a gram of IM Rocephin and started him on p.o. Levaquin.  The  patient defervesced after 48 hours but his pain still persisted and he  was unable to bear weight.  The shaking chills resolved.  On September  3,  a CBC revealed a white blood cell count of 9700.  He has a history of  adult-onset diabetes mellitus, hypertension, and hyperlipidemia.  His  diabetes is fairly well controlled with last hemoglobin A1c 6.5% in July  2008.  However, wife called on February 06, 2007, and said he was not  getting a lot better and continued to have trouble bearing weight and  finally agreed to go to the hospital.  He was admitted for IV  antibiotics, MRI of the foot and ankle, and possible debridement and  incision and drainage if needed.   For past medical history, social history, family history, and details of  physical exam please see dictated history and physical exam.   HOSPITAL COURSE:  The patient was admitted to [facility]  regular medical floor and was placed on IV fluids consisting of normal  saline at [facility], Dilaudid 2 mg IV q.4h. p.r.n. pain, Cipro 400 mg IV  q.12h. after  blood cultures had been drawn.  Dr. Onnie Graham saw the patient  in consultation.  He aspirated a bullous lesion from the right ankle and  sent it for culture and sensitivity.  I spoke with infectious disease  consultants and they advised changing from Cipro to [facility] 1 g IV q.8h.  and also using vancomycin per pharmacy protocol.  The patient was also  placed on Lovenox for DVT prophylaxis.  The patient was then seen in  consultation by Dr. Tommy Medal, infectious disease consultant.  There was  concern that the patient like perhaps have MRSA.  The patient thought he  perhaps could have had a spider bite as he had seen a lot of spiders  around his home during his beach trip.  The patient had a considerable  amount of pain Dilaudid had to be increased from 2 mg to 4 mg IV every 4  hours.  Phenergan was given for nausea.  The patient was unable to eat  and IV fluids were given consisting of normal saline at 75 mL per hour.  The patient was subsequently started on doxycycline on February 08, 2007, 100 mg p.o. q.12h. by infectious  disease consultant.  Vancomycin  levels were obtained per pharmacy protocol.  The patient did improve  significantly with IV antibiotics.  The right leg wound was treated with  peroxide followed by Bactroban to be covered by sterile gauze twice a  day.  The patient also developed upper respiratory infection symptoms  which were thought to be viral in nature and the patient was given  guaifenesin 600 mg p.o. b.i.d.  On February 09, 2007, Fortaz and  doxycycline were discontinued.  On February 10, 2007, vancomycin was  discontinued and the patient was begun on linezolid 600 mg p.o. q.12h.  as recommended by infectious disease consultant.  Also, vitamin B6 50 mg  daily was started with that medication.  The patient was changed from  Dilaudid to Tylox p.o. q.6h. p.r.n. pain.  The patient was very anxious  to go home.  The patient had a maximum temperature of 101 degrees on  September 8, after which time he defervesced and remained afebrile  throughout the duration of his hospital course.  His vital signs  remained stable throughout his entire hospital course and his O2  saturations were normal.  A Doppler study was done to rule out DVT.  The  patient had no evidence of DVT.  MRI of the ankle showed marked soft  tissue swelling with mild enhancement most consistent with cellulitis of  the foot.  No evidence of osteomyelitis.  The patient was also thought  to have infectious myositis of the plantar musculature.  The patient was  noted to have osteoarthritis of the first MTP joint.  The patient had  marked soft tissue swelling and mild enhancement with cellulitis of the  right ankle.  No evidence of abscess and no evidence of osteomyelitis.  There was a large ankle effusion thought to be reactive.  The patient's  white blood cell count remained essentially within normal limits  throughout his entire hospitalization between 8100 and 9800.  His Accu-  Cheks were stable and his serum glucose  ranged from 112-131.  PT and PTT  were stable and normal.  The patient had some mild elevation of SGOT at  72 and SGPT of 62 with an alkaline phosphatase of 159.  It was not clear  what the etiology of these elevated liver functions were but also  could  be related to the musculoskeletal inflammation the patient was having at  the time, although the elevated alkaline phosphatase would not  necessarily go along with that.  By February 09, 2007, his liver  functions had improved from the above values to an SGOT of 52, SGPT of  56, and an alkaline phosphatase of 146.  The patient was found to be  mildly iron deficient with a serum iron of 11, total iron binding  capacity of 162.  B12 level was normal.  RBC folate level was normal.  He was advised to take an iron supplement upon discharge.  It is not  clear why the patient should be iron deficient at this point in time but  that will be investigated further as an outpatient.  Blood cultures grew  nothing.  The bullous fluid from the right ankle that was sent for  culture showed rare white blood cells, predominantly polys, no organisms  seen on Gram's stain and no growth after 3 days.  The infectious disease  consultant thought that the patient probably had infection with MRSA.  However, the patient was convinced that all this was due to a spider  bite.  The patient was discharged home in stable condition with  appropriate followup scheduled for February 17, 2007.           ______________________________  Cresenciano Lick. Renold Genta, M.D.     MJB/MEDQ  D:  04/21/2007  T:  04/21/2007  Job:  [id number]

## 2010-11-17 ENCOUNTER — Telehealth: Payer: Self-pay | Admitting: Internal Medicine

## 2010-11-17 NOTE — Telephone Encounter (Signed)
This will be done right away.

## 2010-11-18 NOTE — Telephone Encounter (Signed)
Received results which were WNL.  Spoke with patient regarding results.  Pt stated he would come in for an OV if flank discomfort persists.

## 2010-12-09 ENCOUNTER — Encounter: Payer: Self-pay | Admitting: Internal Medicine

## 2010-12-09 DIAGNOSIS — Z0000 Encounter for general adult medical examination without abnormal findings: Secondary | ICD-10-CM

## 2010-12-09 NOTE — Telephone Encounter (Signed)
Please call in Sterapred DS 10 mg 12 day dose pack for him. He wants a different strength of Androgel. We had to prior authorize that before. Please call local [facility] to discuss what he wants 1.62?

## 2010-12-09 NOTE — Telephone Encounter (Deleted)
Called pt and spoke with husband. Advised that she could come in for titer if she wanted.  Advised of exposure to breakout time and husband stated he would let her know.

## 2010-12-10 ENCOUNTER — Other Ambulatory Visit: Payer: Self-pay | Admitting: Internal Medicine

## 2010-12-19 ENCOUNTER — Other Ambulatory Visit: Payer: Self-pay | Admitting: Internal Medicine

## 2010-12-20 DIAGNOSIS — Z0000 Encounter for general adult medical examination without abnormal findings: Secondary | ICD-10-CM

## 2010-12-21 ENCOUNTER — Other Ambulatory Visit: Payer: Self-pay | Admitting: Internal Medicine

## 2010-12-22 ENCOUNTER — Other Ambulatory Visit: Payer: Self-pay | Admitting: Internal Medicine

## 2011-01-12 ENCOUNTER — Other Ambulatory Visit: Payer: Self-pay | Admitting: Internal Medicine

## 2011-01-15 ENCOUNTER — Other Ambulatory Visit: Payer: Self-pay | Admitting: Internal Medicine

## 2011-01-16 ENCOUNTER — Other Ambulatory Visit: Payer: Self-pay | Admitting: Internal Medicine

## 2011-01-17 ENCOUNTER — Other Ambulatory Visit: Payer: Self-pay | Admitting: Internal Medicine

## 2011-01-18 ENCOUNTER — Other Ambulatory Visit: Payer: Self-pay | Admitting: Internal Medicine

## 2011-02-03 ENCOUNTER — Telehealth: Payer: Self-pay

## 2011-02-03 NOTE — Telephone Encounter (Signed)
Patient scheduled for appt on Sept 17 and would like a lab order faxed to [facility] in [place]. [phone]. Also, wants you to know that we will be receiving reaquest for a refill on his Hydrocodone before his appt.

## 2011-02-03 NOTE — Telephone Encounter (Signed)
Please get CBC with diff, Hgb AIC, C-met, TSH, Testosterone level, Fasting lipid panel. Order written Please fax to number he requested.

## 2011-02-04 NOTE — Telephone Encounter (Signed)
Order for lab tests faxed to patient's lab of choice

## 2011-02-12 ENCOUNTER — Other Ambulatory Visit: Payer: Self-pay | Admitting: Internal Medicine

## 2011-02-12 MED ORDER — HYDROCODONE-ACETAMINOPHEN 10-650 MG PO TABS: 60.0000 | ORAL_TABLET | Freq: Four times a day (QID) | ORAL | Status: DC | PRN

## 2011-02-12 NOTE — Telephone Encounter (Signed)
Please check and see when last refilled

## 2011-02-12 NOTE — Telephone Encounter (Signed)
Call in #[phone] with no refill

## 2011-02-16 ENCOUNTER — Ambulatory Visit: Payer: Self-pay | Admitting: Internal Medicine

## 2011-02-17 ENCOUNTER — Encounter: Payer: Self-pay | Admitting: Internal Medicine

## 2011-02-18 ENCOUNTER — Encounter: Payer: Self-pay | Admitting: Internal Medicine

## 2011-02-19 ENCOUNTER — Encounter: Payer: Self-pay | Admitting: Internal Medicine

## 2011-02-19 ENCOUNTER — Ambulatory Visit (INDEPENDENT_AMBULATORY_CARE_PROVIDER_SITE_OTHER): Payer: Medicare Other | Admitting: Internal Medicine

## 2011-02-19 VITALS — BP 128/66 | HR 92 | Resp 18 | Ht 70.0 in | Wt 207.0 lb

## 2011-02-19 DIAGNOSIS — I10 Essential (primary) hypertension: Secondary | ICD-10-CM

## 2011-02-19 DIAGNOSIS — Z23 Encounter for immunization: Secondary | ICD-10-CM

## 2011-02-19 DIAGNOSIS — E785 Hyperlipidemia, unspecified: Secondary | ICD-10-CM

## 2011-02-19 DIAGNOSIS — E119 Type 2 diabetes mellitus without complications: Secondary | ICD-10-CM

## 2011-02-19 DIAGNOSIS — N529 Male erectile dysfunction, unspecified: Secondary | ICD-10-CM

## 2011-02-19 DIAGNOSIS — M1990 Unspecified osteoarthritis, unspecified site: Secondary | ICD-10-CM

## 2011-02-19 DIAGNOSIS — K219 Gastro-esophageal reflux disease without esophagitis: Secondary | ICD-10-CM

## 2011-02-26 DIAGNOSIS — I10 Essential (primary) hypertension: Secondary | ICD-10-CM | POA: Insufficient documentation

## 2011-02-26 DIAGNOSIS — K219 Gastro-esophageal reflux disease without esophagitis: Secondary | ICD-10-CM | POA: Insufficient documentation

## 2011-02-26 DIAGNOSIS — E785 Hyperlipidemia, unspecified: Secondary | ICD-10-CM | POA: Insufficient documentation

## 2011-02-26 DIAGNOSIS — N529 Male erectile dysfunction, unspecified: Secondary | ICD-10-CM | POA: Insufficient documentation

## 2011-02-26 DIAGNOSIS — M1990 Unspecified osteoarthritis, unspecified site: Secondary | ICD-10-CM | POA: Insufficient documentation

## 2011-02-26 NOTE — Progress Notes (Signed)
  Subjective:    Patient ID: Roberto Reid, male    DOB: March 28, 1933, 77 y.o.   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 History of hypertension, hyperlipidemia, adult onset diabetes, osteoarthritis and hearing loss. History of GE reflux, erectile dysfunction, low testosterone. Had Zostavax vaccine December 2010, Pneumovax vaccine March 2003, tetanus immunization May 2010. History of insomnia for which he takes Ambien. Has chronic musculoskeletal pain due to osteoarthritis and a chronic foot problem for which he takes hydrocodone/APAP 10/650 twice daily. History of cataract extraction both eyes November 2010. Left knee surgery done by Dr. supple for torn cartilage November 2005 was hospitalized in [place] September 2011 with right leg cellulitis treated with IV antibiotics. History of right kidney stones 2007. History of cellulitis right foot 2008. Colonoscopy done in 2008. History of right second metatarsalgia secondary to Morton's foot status post surgery. History of old Lisfranc fracture subluxation with instability of first and second tarsal metatarsal joints causing chronic pain. Remote history of cluster headaches seen by Dr. Erling Cruz 2001. History of fractured ankle 1953 secondary to football injury. Fractured ribs 1950 09/18/1954.  Has been married twice. He is a nonsmoker presently but formerly smoked and quit in 1990 having smoked a pack a day for 32 years. Social alcohol consumption. 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  Had allergy skin testing 2010 showing positive test to cat and some moles. Minimal reactivity to dog. Spirometry was in normal range. History of dyshidrotic hand eczema. Followed by cardiologist. Planning to have left total knee replacement in the near future.    Review of Systems     Objective:   Physical Exam neck is supple no thyromegaly or carotid bruits; chest  clear to auscultation; cardiac exam regular rate and rhythm, normal S1 and S2; extremities without edema        Assessment & Plan:  Osteoarthritis with planned left total knee replacement in near future  Hypertension  Hyperlipidemia  Adult onset diabetes mellitus  Hearing loss  GE reflux  Erectile dysfunction  History of uric acid kidney stones  History of chronic foot pain  Insomnia  Remote history of cluster headache  Patient is to return in 6 months for physical exam.

## 2011-03-06 ENCOUNTER — Telehealth: Payer: Self-pay | Admitting: Internal Medicine

## 2011-03-06 NOTE — Telephone Encounter (Signed)
Call in Avelox 400 mg daily for 7 days and have pt take Sudafed PE OTC. See Monday if no better.

## 2011-03-06 NOTE — Telephone Encounter (Signed)
Rx called in and patient advised.  Verbalized understanding that he is to take for 7 days with Sudafed PE OTC and make appt with Dr. Renold Genta upon return if no better.

## 2011-03-11 ENCOUNTER — Other Ambulatory Visit: Payer: Self-pay | Admitting: Internal Medicine

## 2011-03-13 LAB — CBC
HCT: 33 — ABNORMAL LOW
Hemoglobin: 10.1 — ABNORMAL LOW
Hemoglobin: 10.5 — ABNORMAL LOW
MCV: 93.6
MCV: 94.3
Platelets: 274
Platelets: 437 — ABNORMAL HIGH
RBC: 3.09 — ABNORMAL LOW
RBC: 3.11 — ABNORMAL LOW
RDW: 12.5
RDW: 12.7
RDW: 13.1
WBC: 8.4
WBC: 9.5

## 2011-03-13 LAB — COMPREHENSIVE METABOLIC PANEL
ALT: 56 — ABNORMAL HIGH
ALT: 62 — ABNORMAL HIGH
AST: 52 — ABNORMAL HIGH
Albumin: 2.5 — ABNORMAL LOW
Alkaline Phosphatase: 159 — ABNORMAL HIGH
BUN: 14
CO2: 29
Calcium: 8.6
Chloride: 101
Creatinine, Ser: 1.27
GFR calc Af Amer: >60
GFR calc non Af Amer: 56 — ABNORMAL LOW
Potassium: 3.8
Sodium: 137
Total Bilirubin: 0.9
Total Protein: 6.6

## 2011-03-13 LAB — URINALYSIS, ROUTINE W REFLEX MICROSCOPIC
Nitrite: NEGATIVE
Specific Gravity, Urine: 1.023
Urobilinogen, UA: 0.2
pH: 5.5

## 2011-03-13 LAB — DIFFERENTIAL
Basophils Absolute: 0
Basophils Absolute: 0
Basophils Absolute: 0
Basophils Relative: 0
Eosinophils Absolute: 0.1
Eosinophils Absolute: 0.1
Eosinophils Relative: 1
Eosinophils Relative: 1
Lymphocytes Relative: 10 — ABNORMAL LOW
Lymphs Abs: 0.7
Lymphs Abs: 0.9
Lymphs Abs: 1.2
Monocytes Absolute: 0.5
Monocytes Absolute: 0.7
Monocytes Relative: 5
Monocytes Relative: 8
Neutro Abs: 7.7
Neutro Abs: 8.5 — ABNORMAL HIGH

## 2011-03-13 LAB — BASIC METABOLIC PANEL
BUN: 15
Calcium: 8.4
Chloride: 102
GFR calc Af Amer: 57 — ABNORMAL LOW
GFR calc non Af Amer: 47 — ABNORMAL LOW
Glucose, Bld: 131 — ABNORMAL HIGH
Potassium: 3.6
Potassium: 3.9
Sodium: 134 — ABNORMAL LOW

## 2011-03-13 LAB — CULTURE, BLOOD (ROUTINE X 2): Culture: NO GROWTH

## 2011-03-13 LAB — IRON AND TIBC: Iron: 11 — ABNORMAL LOW

## 2011-03-13 LAB — CULTURE, ROUTINE-ABSCESS: Culture: NO GROWTH

## 2011-03-13 LAB — URIC ACID: Uric Acid, Serum: 6.7

## 2011-03-20 ENCOUNTER — Encounter: Payer: Self-pay | Admitting: Cardiology

## 2011-03-20 ENCOUNTER — Ambulatory Visit (INDEPENDENT_AMBULATORY_CARE_PROVIDER_SITE_OTHER): Payer: Medicare Other | Admitting: Cardiology

## 2011-03-20 DIAGNOSIS — E785 Hyperlipidemia, unspecified: Secondary | ICD-10-CM

## 2011-03-20 DIAGNOSIS — I10 Essential (primary) hypertension: Secondary | ICD-10-CM

## 2011-03-20 DIAGNOSIS — N529 Male erectile dysfunction, unspecified: Secondary | ICD-10-CM

## 2011-03-20 DIAGNOSIS — Z01810 Encounter for preprocedural cardiovascular examination: Secondary | ICD-10-CM

## 2011-03-20 NOTE — Patient Instructions (Signed)
Your physician recommends that you continue on your current medications as directed. Please refer to the Current Medication list given to you today.   Your physician recommends that you schedule a follow-up appointment as needed  

## 2011-03-20 NOTE — Progress Notes (Signed)
HPI:  He actually is here for cardiac clearance for a knee surgery.  As he does not have significant cardiac disease, not sure that this was necessary, but it has been requested.  He gets along just fine.  No limitations. He is working again  (could not stand retirement), and is commuting to [place].  Wants to work with our local [place].  Dr. Ronnie Derby is scheduled to perform a left knee surgery.  The patient has had prior nuclear imaging.  He gets no chest pain.  He is able to walk pretty much without limitation, although he does not do a lot of walking.  Denies shortness of breath to any magnitude.  He has limited his activity in part due to his knee.  When questioned, however, he thought he could do most things without problems.  Echo study in 2008 revealed normal LV systolic function, with mild fibrocalcific change of the aortic root.  There were no major abnormalities.  My note from 2008 revealed a nuclear study demonstrating 5 minutes of exercise, with RBBB.  There were no ST changes, and EF 79% with mild attenuation.  He has not had change in any symptoms since that time.   Current Outpatient Prescriptions  Medication Sig Dispense Refill  . amLODipine (NORVASC) 5 MG tablet Take 5 mg by mouth daily.        . ANDROGEL PUMP 1.25 GM/ACT (1%) GEL APPLY 6 PUMPS TO SKIN DAILY  150 g  0  . atorvastatin (LIPITOR) 80 MG tablet TAKE ONE TABLET BY MOUTH ONCE DAILY  90 tablet  PRN  . buPROPion (WELLBUTRIN XL) 300 MG 24 hr tablet Take 300 mg by mouth daily.        Marland Kitchen esomeprazole (NEXIUM) 40 MG capsule Take 40 mg by mouth 2 (two) times daily.       Marland Kitchen HYDROcodone-acetaminophen (LORCET) 10-650 MG per tablet TAKE 1 TABLET BY MOUTH EVERY 6 HOURS AS NEEDED  60 tablet  2  . losartan-hydrochlorothiazide (HYZAAR) 100-25 MG per tablet Take 1 tablet by mouth daily.        . metFORMIN (GLUCOPHAGE) 500 MG tablet TAKE 1 TABLET BY MOUTH EVERY MORNING  90 tablet  PRN  . VIAGRA 100 MG tablet TAKE 1 TABLET BY MOUTH AS DIRECTED  30  tablet  PRN  . zolpidem (AMBIEN) 10 MG tablet TAKE 1 TABLET BY MOUTH EVERY NIGHT AT BEDTIME AS NEEDED  30 tablet  5    Allergies  Allergen Reactions  . Percocet (Oxycodone-Acetaminophen)   . Sulfa Antibiotics     Past Medical History  Diagnosis Date  . GERD (gastroesophageal reflux disease)   . Hyperlipidemia   . Hypertension   . Diabetes mellitus   . Hearing loss     Past Surgical History  Procedure Date  . Eye surgery     cataract, bilateral 11/10  . Repair torn knee cartilage     No family history on file.  History   Social History  . Marital Status: Married    Spouse Name: N/A    Number of Children: N/A  . Years of Education: N/A   Occupational History  . Not on file.   Social History Main Topics  . Smoking status: Former Smoker    Types: Cigarettes  . Smokeless tobacco: Never Used  . Alcohol Use: Yes     social  . Drug Use: Not on file  . Sexually Active: Not on file   Other Topics Concern  . Not on  file   Social History Narrative  . No narrative on file    ROS: Please see the HPI.  All other systems reviewed and negative.  PHYSICAL EXAM:  BP 120/76  Pulse 86  Ht 5\' 11"  (1.803 m)  Wt 209 lb (94.802 kg)  BMI 29.15 kg/m2  General: Well developed, well nourished, in no acute distress. Head:  Normocephalic and atraumatic. Neck: no JVD Lungs: Clear to auscultation and percussion. Heart: Normal S1 and S2.  No murmur, rubs or gallops.  Abdomen:  Normal bowel sounds; soft; non tender; no organomegaly Pulses: Pulses normal in all 4 extremities. Extremities: No clubbing or cyanosis. No edema. Neurologic: Alert and oriented x 3.  EKG:  NSR.  RBBB.  ASSESSMENT AND PLAN:

## 2011-03-23 ENCOUNTER — Other Ambulatory Visit: Payer: Self-pay | Admitting: Internal Medicine

## 2011-04-09 ENCOUNTER — Encounter: Payer: Self-pay | Admitting: Internal Medicine

## 2011-04-09 ENCOUNTER — Ambulatory Visit (INDEPENDENT_AMBULATORY_CARE_PROVIDER_SITE_OTHER): Payer: Medicare Other | Admitting: Internal Medicine

## 2011-04-09 VITALS — BP 130/76 | HR 94 | Temp 97.1°F | Wt 200.0 lb

## 2011-04-09 DIAGNOSIS — L0390 Cellulitis, unspecified: Secondary | ICD-10-CM

## 2011-04-09 DIAGNOSIS — I872 Venous insufficiency (chronic) (peripheral): Secondary | ICD-10-CM

## 2011-04-09 DIAGNOSIS — I10 Essential (primary) hypertension: Secondary | ICD-10-CM

## 2011-04-09 DIAGNOSIS — S90811A Abrasion, right foot, initial encounter: Secondary | ICD-10-CM

## 2011-04-09 DIAGNOSIS — E119 Type 2 diabetes mellitus without complications: Secondary | ICD-10-CM

## 2011-04-09 DIAGNOSIS — L0291 Cutaneous abscess, unspecified: Secondary | ICD-10-CM

## 2011-04-09 LAB — CBC WITH DIFFERENTIAL/PLATELET
Basophils Absolute: 0 10*3/uL (ref 0.0–0.1)
Basophils Relative: 0 % (ref 0–1)
Eosinophils Relative: 1 % (ref 0–5)
Lymphocytes Relative: 12 % (ref 12–46)
MCV: 92.5 fL (ref 78.0–100.0)
Platelets: 219 10*3/uL (ref 150–400)
RDW: 12.5 % (ref 11.5–15.5)
WBC: 9.9 10*3/uL (ref 4.0–10.5)

## 2011-04-09 MED ORDER — CEFTRIAXONE SODIUM 1 G IJ SOLR
1.0000 g | Freq: Once | INTRAMUSCULAR | Status: AC
  Administered 2011-04-09: 1 g via INTRAMUSCULAR

## 2011-04-09 NOTE — Progress Notes (Signed)
  Subjective:    Patient ID: Roberto Reid, male    DOB: 1932-10-20, 78 y.o.   MRN: [id number]  HPI patient was on vacation in [place] October 31. He played golf and unfortunately developed a blister on right foot plantar aspect. 48-72 hours later came down with malaise fatigue, erythema right lower extremity and chills. Similar presentation that he had when he was hospitalized in [place]. Unfortunately he had left his antibiotics at home. He was not able to return home on Sunday, November 4 as he had planned because he was to deal. He did not seek medical attention there. Was able to return home a couple of days ago. He started on Avelox 400 mg daily and has improved.    Review of Systems     Objective:   Physical Exam erythema with mild amount of swelling right lower extremity extending to mid lower leg. Increased warmth right lower leg. Has open abraded area plantar aspect right foot with callus associated with it MTP joint.        Assessment & Plan:  Cellulitis right lower extremity  Abrasion right foot plantar aspect  Plan: Rocephin 1 g IM. Continue with Avelox 400 mg daily to complete a 10 day course. CBC with differential drawn. 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 Call if not better in 48-72 hours or sooner if worse

## 2011-04-09 NOTE — Patient Instructions (Addendum)
Complete a ten-day course of Avelox 400 mg daily. Keep right leg elevated and off of it as much as possible. Call if not better in 48-72 hours or sooner if worse

## 2011-04-14 ENCOUNTER — Encounter: Payer: Self-pay | Admitting: Cardiology

## 2011-04-14 NOTE — Assessment & Plan Note (Signed)
Patient is able, without difficulty, to complete four mets or greater of activity.  He has no chest pain, or major limitations.  He has had echo and nuc four years ago which were not revealing, and no interval cardiac events.  He has multiple risks for CAD, but is class I at this time.  No other preop workup is indicated at this time as he can do most things.  He is aware that there are some risks with surgery, and that his history and clinical exam do not raise him to high risk.  The nature of the procedure is of intermediate risk, certainly not high risk.  We will be glad to follow him through surgery, although he does not have defined cardiac disease.

## 2011-04-14 NOTE — Assessment & Plan Note (Signed)
Followed by Dr. Renold Genta.  Is on high dose atorvastatin.

## 2011-04-14 NOTE — Assessment & Plan Note (Signed)
Appears controlled at present.  Continues on medications.

## 2011-04-14 NOTE — Assessment & Plan Note (Signed)
multliple risk factors----on therapy prescribed by his primary MD.

## 2011-05-25 ENCOUNTER — Telehealth: Payer: Self-pay | Admitting: Internal Medicine

## 2011-05-25 MED ORDER — HYDROCODONE-HOMATROPINE 5-1.5 MG/5ML PO SYRP: 5.0000 mL | ORAL_SOLUTION | Freq: Four times a day (QID) | ORAL | Status: DC | PRN

## 2011-05-25 NOTE — Telephone Encounter (Signed)
Please call in Hycodan Syrup 8oz   One tsp po q 6 hours prn cough.

## 2011-05-29 ENCOUNTER — Other Ambulatory Visit: Payer: Self-pay | Admitting: Dermatology

## 2011-06-01 ENCOUNTER — Other Ambulatory Visit: Payer: Self-pay | Admitting: Internal Medicine

## 2011-06-03 ENCOUNTER — Ambulatory Visit
Admission: RE | Admit: 2011-06-03 | Discharge: 2011-06-03 | Disposition: A | Discharge status: Home / Self Care | Payer: Medicare Other | Source: Ambulatory Visit | Attending: Internal Medicine | Admitting: Internal Medicine

## 2011-06-03 ENCOUNTER — Telehealth: Payer: Self-pay

## 2011-06-03 ENCOUNTER — Ambulatory Visit (INDEPENDENT_AMBULATORY_CARE_PROVIDER_SITE_OTHER): Payer: Medicare Other | Admitting: Internal Medicine

## 2011-06-03 DIAGNOSIS — J40 Bronchitis, not specified as acute or chronic: Secondary | ICD-10-CM

## 2011-06-03 MED ORDER — CEFTRIAXONE SODIUM 1 G IJ SOLR
1.0000 g | Freq: Once | INTRAMUSCULAR | Status: AC
  Administered 2011-06-03: 1 g via INTRAMUSCULAR

## 2011-06-03 MED ORDER — HYDROCODONE-HOMATROPINE 5-1.5 MG/5ML PO SYRP: 5.0000 mL | ORAL_SOLUTION | Freq: Four times a day (QID) | ORAL | Status: DC | PRN

## 2011-06-03 NOTE — Telephone Encounter (Signed)
Still has an URI, wanted to come in to see you around 3:00 today, told him you are unavailable

## 2011-06-08 ENCOUNTER — Encounter: Payer: Self-pay | Admitting: Internal Medicine

## 2011-06-08 NOTE — Progress Notes (Signed)
  Subjective:    Patient ID: Roberto Reid, male    DOB: February 12, 1933, 78 y.o.   MRN: [id number]  HPI  patient had a Christmas party at his home on December 15. A number of people there came down with respiratory infection shortly thereafter. He has been ill since that time. He called requesting some hydrocodone cough syrup which we called in for him. However he has not gotten better. He's got a congested cough which is nonproductive. He is going to loss latest tomorrow to a convention. Recently completed a project for [employer] in [place] and is returned home. He was to have knee replacement surgery in January but thinks he may postpone it. Doesn't want to be house bound for the winter months. Feels a bit depressed now that he doesn't have a full-time job.    Review of Systems     Objective:   Physical Exam HEENT exam: TMs and pharynx are clear; neck supple; chest bilateral rhonchi and wheezing        Assessment & Plan:  Bronchitis  Postretirement boredom  Plan: Patient will lead to find some activity to keep him busy since he is now retired. It is likely he will find another project to work on. He finds it difficult to stay at home and not be busy. Previously we tried Wellbutrin for this and it worked well so we have started him on Wellbutrin XL 300 mg daily. Have refilled Hycodan cough syrup 8 ounces 1 teaspoon by mouth every 6 hours when necessary cough. Was given 1 g IM Rocephin since he is going out-of-town. His pulse oximetry is 96% on room air. Started on Avelox 400 mg daily for 10 days. Order chest x-ray to rule out pneumonia  Chest x-ray shows no pneumonia

## 2011-06-08 NOTE — Patient Instructions (Signed)
Start Wellbutrin XL 300 mg daily. This will help with energy and motivation. Hycodan has been refilled for cough. Start Avelox 400 mg daily for 10 days. Call if not better in one week or sooner if worse. Chest x-ray will be ordered.

## 2011-06-09 ENCOUNTER — Other Ambulatory Visit: Payer: Self-pay | Admitting: Internal Medicine

## 2011-06-15 ENCOUNTER — Telehealth: Payer: Self-pay

## 2011-06-15 MED ORDER — ZOLPIDEM TARTRATE ER 12.5 MG PO TBCR: 12.5000 mg | EXTENDED_RELEASE_TABLET | Freq: Every evening | ORAL | Status: DC | PRN

## 2011-06-15 NOTE — Telephone Encounter (Signed)
Patient requesting new rx for Ambien CR 12.5mg , i think. Says he awakens at 4:00 am when  He takes the 10 mg Ambien.

## 2011-06-18 ENCOUNTER — Other Ambulatory Visit (HOSPITAL_COMMUNITY): Payer: Medicare Other

## 2011-06-29 ENCOUNTER — Ambulatory Visit (HOSPITAL_COMMUNITY): Admission: RE | Admit: 2011-06-29 | Payer: Medicare Other | Source: Ambulatory Visit | Admitting: Orthopedic Surgery

## 2011-06-29 ENCOUNTER — Encounter (HOSPITAL_COMMUNITY): Admission: RE | Payer: Self-pay | Source: Ambulatory Visit

## 2011-06-29 SURGERY — ARTHROPLASTY, KNEE, TOTAL
Anesthesia: Regional | Laterality: Left

## 2011-07-07 ENCOUNTER — Other Ambulatory Visit: Payer: Self-pay | Admitting: Internal Medicine

## 2011-07-07 NOTE — Telephone Encounter (Signed)
Please check with drug store. i thought this was refillable. Refill for one year.

## 2011-07-21 ENCOUNTER — Other Ambulatory Visit: Payer: Self-pay | Admitting: Internal Medicine

## 2011-07-21 NOTE — Telephone Encounter (Signed)
This is a request for different strength than what was given Jan 14th. Please clarify what pt wants. On Jan 14th we filled Ambien CR 12.5 mg.

## 2011-07-23 ENCOUNTER — Other Ambulatory Visit: Payer: Self-pay

## 2011-07-23 ENCOUNTER — Other Ambulatory Visit: Payer: Self-pay | Admitting: Internal Medicine

## 2011-07-23 MED ORDER — ZOLPIDEM TARTRATE 10 MG PO TABS: 10.0000 mg | ORAL_TABLET | Freq: Every evening | ORAL | Status: DC | PRN

## 2011-08-24 DIAGNOSIS — R6882 Decreased libido: Secondary | ICD-10-CM

## 2011-09-23 ENCOUNTER — Other Ambulatory Visit: Payer: Self-pay | Admitting: Internal Medicine

## 2011-10-07 ENCOUNTER — Other Ambulatory Visit: Payer: Self-pay | Admitting: Internal Medicine

## 2011-10-07 NOTE — Telephone Encounter (Signed)
Call in one month supply with one refill please.

## 2011-11-04 ENCOUNTER — Other Ambulatory Visit: Payer: Self-pay | Admitting: Internal Medicine

## 2011-11-05 NOTE — Telephone Encounter (Signed)
Why does he need refill? Please call him.

## 2011-11-05 NOTE — Telephone Encounter (Signed)
Why does he need this?

## 2011-11-06 ENCOUNTER — Other Ambulatory Visit: Payer: Self-pay

## 2011-11-06 MED ORDER — CLINDAMYCIN HCL 300 MG PO CAPS: 300.0000 mg | ORAL_CAPSULE | Freq: Three times a day (TID) | ORAL | Status: AC

## 2011-11-07 ENCOUNTER — Other Ambulatory Visit: Payer: Self-pay | Admitting: Internal Medicine

## 2011-11-09 ENCOUNTER — Ambulatory Visit (INDEPENDENT_AMBULATORY_CARE_PROVIDER_SITE_OTHER): Payer: Medicare Other | Admitting: Internal Medicine

## 2011-11-09 ENCOUNTER — Encounter: Payer: Self-pay | Admitting: Internal Medicine

## 2011-11-09 VITALS — BP 130/80

## 2011-11-09 DIAGNOSIS — T148XXA Other injury of unspecified body region, initial encounter: Secondary | ICD-10-CM

## 2011-11-09 DIAGNOSIS — IMO0002 Reserved for concepts with insufficient information to code with codable children: Secondary | ICD-10-CM

## 2011-11-09 DIAGNOSIS — R5383 Other fatigue: Secondary | ICD-10-CM

## 2011-11-09 DIAGNOSIS — M179 Osteoarthritis of knee, unspecified: Secondary | ICD-10-CM

## 2011-11-09 DIAGNOSIS — R5381 Other malaise: Secondary | ICD-10-CM

## 2011-11-09 DIAGNOSIS — M1710 Unilateral primary osteoarthritis, unspecified knee: Secondary | ICD-10-CM

## 2011-11-09 LAB — APTT: aPTT: 31 seconds (ref 24–37)

## 2011-11-09 LAB — PROTIME-INR
INR: 1.01 (ref <1.50)
Prothrombin Time: 13.7 seconds (ref 11.6–15.2)

## 2011-11-09 NOTE — Progress Notes (Signed)
  Subjective:    Patient ID: Roberto Reid, male    DOB: [birth year], 78 y.o.   MRN: [id number]  HPI 78 year old white male who is to have left knee replacement surgery on July 15 by Dr. Evert Kohl today discuss several issues. He recently asked for refill on Cleocin. Sometimes has cellulitis of right leg related to an apparent brown  Recluse spider bite several years ago. This recurred several times with minimal trauma. He also has a right foot deformity status post surgery a few years ago. Right great toe crosses over second toe. He says he has a broken PN in that foot and that it did not heal well postoperatively. He needs a form signed to have cold therapy device for knee replacement surgery postoperatively. This was completed. He also has some concern about his testosterone level. He wonders if he should take an injection rather than using AndroGel. He wants his testosterone level checked. He has some discoloration on the dorsal aspect of his hands. Orthopedist wants PT and PTT checked.    Review of Systems     Objective:   Physical Exam chest clear to auscultation; cardiac exam regular rate and rhythm; extremities without edema; has discoloration dorsal aspect of both hands        Assessment & Plan:  Discoloration of hands  Osteoarthritis left knee  Deformity right foot  History of low testosterone and fatigue  History of hyperlipidemia  History of hypertension  Plan: Testosterone level drawn, PT and PTT drawn. May need to see urologist regarding testosterone therapy.

## 2011-11-09 NOTE — Patient Instructions (Addendum)
Knee replacement surgery has been scheduled for July 15. Return as needed. May need to see urologist regarding testosterone therapy.

## 2011-11-11 LAB — TESTOSTERONE, FREE, TOTAL, SHBG: Testosterone: 652.01 ng/dL (ref 300–890)

## 2011-11-26 ENCOUNTER — Other Ambulatory Visit: Payer: Self-pay | Admitting: Orthopedic Surgery

## 2011-12-07 ENCOUNTER — Ambulatory Visit (HOSPITAL_COMMUNITY)
Admission: RE | Admit: 2011-12-07 | Discharge: 2011-12-07 | Disposition: A | Discharge status: Home / Self Care | Payer: Medicare Other | Source: Ambulatory Visit | Attending: Orthopedic Surgery | Admitting: Orthopedic Surgery

## 2011-12-07 ENCOUNTER — Encounter (HOSPITAL_COMMUNITY): Payer: Self-pay

## 2011-12-07 ENCOUNTER — Encounter (HOSPITAL_COMMUNITY)
Admission: RE | Admit: 2011-12-07 | Discharge: 2011-12-07 | Disposition: A | Discharge status: Home / Self Care | Payer: Medicare Other | Source: Ambulatory Visit | Attending: Orthopedic Surgery | Admitting: Orthopedic Surgery

## 2011-12-07 ENCOUNTER — Encounter (HOSPITAL_COMMUNITY): Payer: Self-pay | Admitting: Respiratory Therapy

## 2011-12-07 DIAGNOSIS — I10 Essential (primary) hypertension: Secondary | ICD-10-CM | POA: Insufficient documentation

## 2011-12-07 DIAGNOSIS — I4510 Unspecified right bundle-branch block: Secondary | ICD-10-CM | POA: Insufficient documentation

## 2011-12-07 DIAGNOSIS — Z01810 Encounter for preprocedural cardiovascular examination: Secondary | ICD-10-CM | POA: Insufficient documentation

## 2011-12-07 DIAGNOSIS — Z01811 Encounter for preprocedural respiratory examination: Secondary | ICD-10-CM | POA: Insufficient documentation

## 2011-12-07 DIAGNOSIS — Z01812 Encounter for preprocedural laboratory examination: Secondary | ICD-10-CM | POA: Insufficient documentation

## 2011-12-07 HISTORY — DX: Unspecified osteoarthritis, unspecified site: M19.90

## 2011-12-07 LAB — COMPREHENSIVE METABOLIC PANEL
Alkaline Phosphatase: 41 U/L (ref 39–117)
BUN: 28 mg/dL — ABNORMAL HIGH (ref 6–23)
CO2: 28 mEq/L (ref 19–32)
Calcium: 9.7 mg/dL (ref 8.4–10.5)
GFR calc Af Amer: 59 mL/min — ABNORMAL LOW (ref >90)
GFR calc non Af Amer: 51 mL/min — ABNORMAL LOW (ref >90)
Glucose, Bld: 102 mg/dL — ABNORMAL HIGH (ref 70–99)
Potassium: 4.2 mEq/L (ref 3.5–5.1)
Total Protein: 7.2 g/dL (ref 6.0–8.3)

## 2011-12-07 LAB — URINALYSIS, ROUTINE W REFLEX MICROSCOPIC
Bilirubin Urine: NEGATIVE
Hgb urine dipstick: NEGATIVE
Ketones, ur: NEGATIVE mg/dL
Nitrite: NEGATIVE
Protein, ur: NEGATIVE mg/dL
Specific Gravity, Urine: 1.023 (ref 1.005–1.030)
Urobilinogen, UA: 0.2 mg/dL (ref 0.0–1.0)

## 2011-12-07 LAB — DIFFERENTIAL
Basophils Absolute: 0 10*3/uL (ref 0.0–0.1)
Eosinophils Absolute: 0.1 10*3/uL (ref 0.0–0.7)
Lymphs Abs: 1.8 10*3/uL (ref 0.7–4.0)
Neutrophils Relative %: 66 % (ref 43–77)

## 2011-12-07 LAB — CBC
HCT: 41.6 % (ref 39.0–52.0)
Hemoglobin: 14.4 g/dL (ref 13.0–17.0)
MCH: 32.4 pg (ref 26.0–34.0)
MCHC: 34.6 g/dL (ref 30.0–36.0)

## 2011-12-07 LAB — SURGICAL PCR SCREEN
MRSA, PCR: NEGATIVE
Staphylococcus aureus: POSITIVE — AB

## 2011-12-07 LAB — PROTIME-INR
INR: 0.99 (ref 0.00–1.49)
Prothrombin Time: 13.3 seconds (ref 11.6–15.2)

## 2011-12-07 LAB — TYPE AND SCREEN
ABO/RH(D): O POS
Antibody Screen: NEGATIVE

## 2011-12-07 NOTE — Pre-Procedure Instructions (Signed)
[place]  12/07/2011   Your procedure is scheduled on: Monday 12/14/11   Report to [facility] at 530 AM.  Call this number if you have problems the morning of surgery: [phone]   Remember:   Do not eat food OR DRINK :After Midnight.  Take these medicines the morning of surgery with A SIP OF WATER: NORVASC(AMLODIPINE), WELLBUTRIN, HYDROCODONE  IF NEEDED, NEXIUM   Do not wear jewelry, make-up or nail polish.  Do not wear lotions, powders, or perfumes. You may wear deodorant.  Do not shave 48 hours prior to surgery. Men may shave face and neck.  Do not bring valuables to the hospital.  Contacts, dentures or bridgework may not be worn into surgery.  Leave suitcase in the car. After surgery it may be brought to your room.  For patients admitted to the hospital, checkout time is 11:00 AM the day of discharge.   Patients discharged the day of surgery will not be allowed to drive home.  Name and phone number of your driver:   Special Instructions: CHG Shower Use Special Wash: 1/2 bottle night before surgery and 1/2 bottle morning of surgery.   Please read over the following fact sheets that you were given: Pain Booklet, Coughing and Deep Breathing, Blood Transfusion Information, Total Joint Packet, MRSA Information and Surgical Site Infection Prevention

## 2011-12-08 LAB — URINE CULTURE: Culture: NO GROWTH

## 2011-12-10 ENCOUNTER — Other Ambulatory Visit: Payer: Self-pay | Admitting: Internal Medicine

## 2011-12-13 MED ORDER — CEFAZOLIN SODIUM-DEXTROSE 2-3 GM-% IV SOLR
2.0000 g | INTRAVENOUS | Status: AC
  Administered 2011-12-14: 2 g via INTRAVENOUS
  Filled 2011-12-13: qty 50

## 2011-12-14 ENCOUNTER — Ambulatory Visit (HOSPITAL_COMMUNITY): Payer: Medicare Other | Admitting: Certified Registered"

## 2011-12-14 ENCOUNTER — Inpatient Hospital Stay (HOSPITAL_COMMUNITY)
Admission: RE | Admit: 2011-12-14 | Discharge: 2011-12-16 | DRG: 470 | Disposition: A | Discharge status: Home Health | Payer: Medicare Other | Source: Ambulatory Visit | Attending: Orthopedic Surgery | Admitting: Orthopedic Surgery

## 2011-12-14 ENCOUNTER — Encounter (HOSPITAL_COMMUNITY): Payer: Self-pay | Admitting: General Practice

## 2011-12-14 ENCOUNTER — Encounter (HOSPITAL_COMMUNITY): Payer: Self-pay | Admitting: Surgery

## 2011-12-14 ENCOUNTER — Encounter (HOSPITAL_COMMUNITY)
Admission: RE | Disposition: A | Discharge status: Home Health | Payer: Self-pay | Source: Ambulatory Visit | Attending: Orthopedic Surgery

## 2011-12-14 ENCOUNTER — Encounter (HOSPITAL_COMMUNITY): Payer: Self-pay | Admitting: Certified Registered"

## 2011-12-14 DIAGNOSIS — M1710 Unilateral primary osteoarthritis, unspecified knee: Principal | ICD-10-CM | POA: Diagnosis present

## 2011-12-14 DIAGNOSIS — K219 Gastro-esophageal reflux disease without esophagitis: Secondary | ICD-10-CM | POA: Diagnosis present

## 2011-12-14 DIAGNOSIS — M1712 Unilateral primary osteoarthritis, left knee: Secondary | ICD-10-CM

## 2011-12-14 DIAGNOSIS — Z79899 Other long term (current) drug therapy: Secondary | ICD-10-CM

## 2011-12-14 DIAGNOSIS — Z8701 Personal history of pneumonia (recurrent): Secondary | ICD-10-CM

## 2011-12-14 DIAGNOSIS — E119 Type 2 diabetes mellitus without complications: Secondary | ICD-10-CM | POA: Diagnosis present

## 2011-12-14 DIAGNOSIS — Z87891 Personal history of nicotine dependence: Secondary | ICD-10-CM

## 2011-12-14 DIAGNOSIS — H9190 Unspecified hearing loss, unspecified ear: Secondary | ICD-10-CM | POA: Diagnosis present

## 2011-12-14 DIAGNOSIS — I10 Essential (primary) hypertension: Secondary | ICD-10-CM | POA: Diagnosis present

## 2011-12-14 DIAGNOSIS — D62 Acute posthemorrhagic anemia: Secondary | ICD-10-CM | POA: Diagnosis not present

## 2011-12-14 DIAGNOSIS — E785 Hyperlipidemia, unspecified: Secondary | ICD-10-CM | POA: Diagnosis present

## 2011-12-14 DIAGNOSIS — Z882 Allergy status to sulfonamides status: Secondary | ICD-10-CM

## 2011-12-14 HISTORY — PX: TOTAL KNEE ARTHROPLASTY: SHX125

## 2011-12-14 HISTORY — DX: Pneumonia, unspecified organism: J18.9

## 2011-12-14 LAB — GLUCOSE, CAPILLARY

## 2011-12-14 LAB — HEMOGLOBIN A1C: Hgb A1c MFr Bld: 5.6 % (ref <5.7)

## 2011-12-14 SURGERY — ARTHROPLASTY, KNEE, TOTAL
Anesthesia: General | Site: Knee | Laterality: Left | Wound class: Clean

## 2011-12-14 MED ORDER — HYDROMORPHONE HCL PF 1 MG/ML IJ SOLN
INTRAMUSCULAR | Status: AC
  Filled 2011-12-14: qty 2

## 2011-12-14 MED ORDER — SODIUM CHLORIDE 0.9 % IR SOLN
Status: DC | PRN
  Administered 2011-12-14: 3000 mL

## 2011-12-14 MED ORDER — HYDROCODONE-ACETAMINOPHEN 10-325 MG PO TABS
1.0000 | ORAL_TABLET | ORAL | Status: DC | PRN
  Administered 2011-12-14 – 2011-12-15 (×6): 2 via ORAL
  Administered 2011-12-15: 1 via ORAL
  Administered 2011-12-16 (×4): 2 via ORAL
  Filled 2011-12-14 (×11): qty 2

## 2011-12-14 MED ORDER — INSULIN ASPART 100 UNIT/ML ~~LOC~~ SOLN: 0.0000 [IU] | Freq: Three times a day (TID) | SUBCUTANEOUS | Status: DC

## 2011-12-14 MED ORDER — PROPOFOL 10 MG/ML IV EMUL
INTRAVENOUS | Status: DC | PRN
  Administered 2011-12-14: 150 mg via INTRAVENOUS
  Administered 2011-12-14: 50 mg via INTRAVENOUS

## 2011-12-14 MED ORDER — METOCLOPRAMIDE HCL 5 MG/ML IJ SOLN: 5.0000 mg | Freq: Three times a day (TID) | INTRAMUSCULAR | Status: DC | PRN

## 2011-12-14 MED ORDER — ACETAMINOPHEN 10 MG/ML IV SOLN
1000.0000 mg | Freq: Four times a day (QID) | INTRAVENOUS | Status: DC
  Administered 2011-12-14: 1000 mg via INTRAVENOUS

## 2011-12-14 MED ORDER — MENTHOL 3 MG MT LOZG: 1.0000 | LOZENGE | OROMUCOSAL | Status: DC | PRN

## 2011-12-14 MED ORDER — HYDROMORPHONE HCL PF 1 MG/ML IJ SOLN
0.2500 mg | INTRAMUSCULAR | Status: DC | PRN
  Administered 2011-12-14 (×4): 0.5 mg via INTRAVENOUS

## 2011-12-14 MED ORDER — TESTOSTERONE 12.5 MG/ACT (1%) TD GEL: 3.0000 | Freq: Every day | TRANSDERMAL | Status: DC

## 2011-12-14 MED ORDER — BUPIVACAINE-EPINEPHRINE PF 0.25-1:200000 % IJ SOLN
INTRAMUSCULAR | Status: AC
  Filled 2011-12-14: qty 30

## 2011-12-14 MED ORDER — ACETAMINOPHEN 10 MG/ML IV SOLN
1000.0000 mg | Freq: Four times a day (QID) | INTRAVENOUS | Status: AC
  Administered 2011-12-14 (×2): 1000 mg via INTRAVENOUS
  Filled 2011-12-14 (×4): qty 100

## 2011-12-14 MED ORDER — METHOCARBAMOL 100 MG/ML IJ SOLN
500.0000 mg | Freq: Four times a day (QID) | INTRAVENOUS | Status: DC | PRN
  Filled 2011-12-14: qty 5

## 2011-12-14 MED ORDER — PANTOPRAZOLE SODIUM 40 MG PO TBEC
80.0000 mg | DELAYED_RELEASE_TABLET | Freq: Every day | ORAL | Status: DC
  Administered 2011-12-14 – 2011-12-16 (×3): 80 mg via ORAL
  Filled 2011-12-14 (×3): qty 2

## 2011-12-14 MED ORDER — ACETAMINOPHEN 10 MG/ML IV SOLN
INTRAVENOUS | Status: AC
  Filled 2011-12-14: qty 100

## 2011-12-14 MED ORDER — OXYCODONE HCL 10 MG PO TB12
10.0000 mg | ORAL_TABLET | Freq: Two times a day (BID) | ORAL | Status: DC
  Administered 2011-12-14 – 2011-12-16 (×5): 10 mg via ORAL
  Filled 2011-12-14 (×5): qty 1

## 2011-12-14 MED ORDER — AMLODIPINE BESYLATE 5 MG PO TABS
5.0000 mg | ORAL_TABLET | Freq: Every day | ORAL | Status: DC
  Administered 2011-12-14 – 2011-12-16 (×3): 5 mg via ORAL
  Filled 2011-12-14 (×3): qty 1

## 2011-12-14 MED ORDER — ONDANSETRON HCL 4 MG/2ML IJ SOLN: 4.0000 mg | Freq: Once | INTRAMUSCULAR | Status: DC | PRN

## 2011-12-14 MED ORDER — METOCLOPRAMIDE HCL 10 MG PO TABS: 5.0000 mg | ORAL_TABLET | Freq: Three times a day (TID) | ORAL | Status: DC | PRN

## 2011-12-14 MED ORDER — ENOXAPARIN SODIUM 30 MG/0.3ML ~~LOC~~ SOLN
30.0000 mg | Freq: Two times a day (BID) | SUBCUTANEOUS | Status: DC
  Administered 2011-12-15 – 2011-12-16 (×3): 30 mg via SUBCUTANEOUS
  Filled 2011-12-14 (×5): qty 0.3

## 2011-12-14 MED ORDER — BISACODYL 5 MG PO TBEC: 5.0000 mg | DELAYED_RELEASE_TABLET | Freq: Every day | ORAL | Status: DC | PRN

## 2011-12-14 MED ORDER — DIPHENHYDRAMINE HCL 12.5 MG/5ML PO ELIX: 12.5000 mg | ORAL_SOLUTION | ORAL | Status: DC | PRN

## 2011-12-14 MED ORDER — LIDOCAINE HCL (CARDIAC) 20 MG/ML IV SOLN
INTRAVENOUS | Status: DC | PRN
  Administered 2011-12-14: 100 mg via INTRAVENOUS

## 2011-12-14 MED ORDER — METHOCARBAMOL 500 MG PO TABS
500.0000 mg | ORAL_TABLET | Freq: Four times a day (QID) | ORAL | Status: DC | PRN
  Administered 2011-12-14 – 2011-12-16 (×4): 500 mg via ORAL
  Filled 2011-12-14 (×4): qty 1

## 2011-12-14 MED ORDER — SODIUM CHLORIDE 0.9 % IV SOLN
INTRAVENOUS | Status: DC
  Administered 2011-12-14 – 2011-12-15 (×2): via INTRAVENOUS

## 2011-12-14 MED ORDER — LOSARTAN POTASSIUM 50 MG PO TABS
100.0000 mg | ORAL_TABLET | Freq: Every day | ORAL | Status: DC
  Administered 2011-12-14 – 2011-12-16 (×3): 100 mg via ORAL
  Filled 2011-12-14 (×3): qty 2

## 2011-12-14 MED ORDER — BUPIVACAINE-EPINEPHRINE PF 0.5-1:200000 % IJ SOLN
INTRAMUSCULAR | Status: DC | PRN
  Administered 2011-12-14: 30 mL

## 2011-12-14 MED ORDER — ACETAMINOPHEN 650 MG RE SUPP: 650.0000 mg | Freq: Four times a day (QID) | RECTAL | Status: DC | PRN

## 2011-12-14 MED ORDER — ONDANSETRON HCL 4 MG/2ML IJ SOLN: 4.0000 mg | Freq: Four times a day (QID) | INTRAMUSCULAR | Status: DC | PRN

## 2011-12-14 MED ORDER — LACTATED RINGERS IV SOLN
INTRAVENOUS | Status: DC | PRN
  Administered 2011-12-14 (×2): via INTRAVENOUS

## 2011-12-14 MED ORDER — SENNOSIDES-DOCUSATE SODIUM 8.6-50 MG PO TABS: 1.0000 | ORAL_TABLET | Freq: Every evening | ORAL | Status: DC | PRN

## 2011-12-14 MED ORDER — MIDAZOLAM HCL 5 MG/5ML IJ SOLN
INTRAMUSCULAR | Status: DC | PRN
  Administered 2011-12-14: 2 mg via INTRAVENOUS
  Administered 2011-12-14 (×2): 1 mg via INTRAVENOUS

## 2011-12-14 MED ORDER — FENTANYL CITRATE 0.05 MG/ML IJ SOLN
INTRAMUSCULAR | Status: DC | PRN
  Administered 2011-12-14 (×4): 50 ug via INTRAVENOUS
  Administered 2011-12-14: 75 ug via INTRAVENOUS
  Administered 2011-12-14: 50 ug via INTRAVENOUS
  Administered 2011-12-14: 100 ug via INTRAVENOUS
  Administered 2011-12-14: 25 ug via INTRAVENOUS
  Administered 2011-12-14: 50 ug via INTRAVENOUS

## 2011-12-14 MED ORDER — ONDANSETRON HCL 4 MG/2ML IJ SOLN
INTRAMUSCULAR | Status: DC | PRN
  Administered 2011-12-14: 4 mg via INTRAVENOUS

## 2011-12-14 MED ORDER — ALUM & MAG HYDROXIDE-SIMETH 200-200-20 MG/5ML PO SUSP: 30.0000 mL | ORAL | Status: DC | PRN

## 2011-12-14 MED ORDER — FLEET ENEMA 7-19 GM/118ML RE ENEM: 1.0000 | ENEMA | Freq: Once | RECTAL | Status: AC | PRN

## 2011-12-14 MED ORDER — CHLORHEXIDINE GLUCONATE 4 % EX LIQD: 60.0000 mL | Freq: Once | CUTANEOUS | Status: DC

## 2011-12-14 MED ORDER — PHENOL 1.4 % MT LIQD: 1.0000 | OROMUCOSAL | Status: DC | PRN

## 2011-12-14 MED ORDER — HYDROMORPHONE HCL PF 1 MG/ML IJ SOLN
1.0000 mg | INTRAMUSCULAR | Status: DC | PRN
  Administered 2011-12-14 – 2011-12-15 (×4): 1 mg via INTRAVENOUS
  Filled 2011-12-14 (×4): qty 1

## 2011-12-14 MED ORDER — ATORVASTATIN CALCIUM 80 MG PO TABS
80.0000 mg | ORAL_TABLET | Freq: Every day | ORAL | Status: DC
  Administered 2011-12-14 – 2011-12-15 (×2): 80 mg via ORAL
  Filled 2011-12-14 (×3): qty 1

## 2011-12-14 MED ORDER — DOCUSATE SODIUM 100 MG PO CAPS
100.0000 mg | ORAL_CAPSULE | Freq: Two times a day (BID) | ORAL | Status: DC
  Administered 2011-12-14 – 2011-12-16 (×5): 100 mg via ORAL
  Filled 2011-12-14 (×7): qty 1

## 2011-12-14 MED ORDER — CEFAZOLIN SODIUM 1-5 GM-% IV SOLN
1.0000 g | Freq: Four times a day (QID) | INTRAVENOUS | Status: AC
  Administered 2011-12-14 (×2): 1 g via INTRAVENOUS
  Filled 2011-12-14 (×2): qty 50

## 2011-12-14 MED ORDER — BUPIVACAINE 0.25 % ON-Q PUMP SINGLE CATH 300ML
300.0000 mL | INJECTION | Status: DC
  Filled 2011-12-14: qty 300

## 2011-12-14 MED ORDER — ACETAMINOPHEN 325 MG PO TABS: 650.0000 mg | ORAL_TABLET | Freq: Four times a day (QID) | ORAL | Status: DC | PRN

## 2011-12-14 MED ORDER — ONDANSETRON HCL 4 MG PO TABS: 4.0000 mg | ORAL_TABLET | Freq: Four times a day (QID) | ORAL | Status: DC | PRN

## 2011-12-14 MED ORDER — BUPIVACAINE 0.25 % ON-Q PUMP SINGLE CATH 300ML
INJECTION | Status: DC | PRN
  Administered 2011-12-14: 300 mL

## 2011-12-14 MED ORDER — SODIUM CHLORIDE 0.9 % IV SOLN: INTRAVENOUS | Status: DC

## 2011-12-14 MED ORDER — ZOLPIDEM TARTRATE 5 MG PO TABS: 5.0000 mg | ORAL_TABLET | Freq: Every evening | ORAL | Status: DC | PRN

## 2011-12-14 MED ORDER — BUPIVACAINE-EPINEPHRINE 0.25% -1:200000 IJ SOLN
INTRAMUSCULAR | Status: DC | PRN
  Administered 2011-12-14: 20 mL

## 2011-12-14 MED ORDER — PHENYLEPHRINE HCL 10 MG/ML IJ SOLN
INTRAMUSCULAR | Status: DC | PRN
  Administered 2011-12-14: 80 ug via INTRAVENOUS
  Administered 2011-12-14 (×4): 40 ug via INTRAVENOUS

## 2011-12-14 MED ORDER — BUPIVACAINE ON-Q PAIN PUMP (FOR ORDER SET NO CHG)
INJECTION | Status: DC
  Filled 2011-12-14: qty 1

## 2011-12-14 SURGICAL SUPPLY — 57 items
BANDAGE ESMARK 6X9 LF (GAUZE/BANDAGES/DRESSINGS) ×1 IMPLANT
BLADE SAGITTAL 13X1.27X60 (BLADE) ×2 IMPLANT
BLADE SAW SGTL 83.5X18.5 (BLADE) ×2 IMPLANT
BNDG CMPR 9X6 STRL LF SNTH (GAUZE/BANDAGES/DRESSINGS) ×1
BNDG ESMARK 6X9 LF (GAUZE/BANDAGES/DRESSINGS) ×2
BOWL SMART MIX CTS (DISPOSABLE) ×2 IMPLANT
CATH KIT ON Q 5IN SLV (PAIN MANAGEMENT) ×2 IMPLANT
CEMENT BONE SIMPLEX SPEEDSET (Cement) ×3 IMPLANT
CLOTH BEACON ORANGE TIMEOUT ST (SAFETY) ×2 IMPLANT
COVER BACK TABLE 24X17X13 BIG (DRAPES) IMPLANT
COVER SURGICAL LIGHT HANDLE (MISCELLANEOUS) ×2 IMPLANT
CUFF TOURNIQUET SINGLE 34IN LL (TOURNIQUET CUFF) ×2 IMPLANT
DRAPE EXTREMITY T 121X128X90 (DRAPE) ×2 IMPLANT
DRAPE INCISE IOBAN 66X45 STRL (DRAPES) ×4 IMPLANT
DRAPE PROXIMA HALF (DRAPES) ×2 IMPLANT
DRAPE U-SHAPE 47X51 STRL (DRAPES) ×2 IMPLANT
DRSG ADAPTIC 3X8 NADH LF (GAUZE/BANDAGES/DRESSINGS) ×2 IMPLANT
DRSG PAD ABDOMINAL 8X10 ST (GAUZE/BANDAGES/DRESSINGS) ×2 IMPLANT
DURAPREP 26ML APPLICATOR (WOUND CARE) ×4 IMPLANT
ELECT REM PT RETURN 9FT ADLT (ELECTROSURGICAL) ×2
ELECTRODE REM PT RTRN 9FT ADLT (ELECTROSURGICAL) ×1 IMPLANT
EVACUATOR 1/8 PVC DRAIN (DRAIN) ×2 IMPLANT
GLOVE BIOGEL M 7.0 STRL (GLOVE) IMPLANT
GLOVE BIOGEL PI IND STRL 7.5 (GLOVE) IMPLANT
GLOVE BIOGEL PI IND STRL 8.5 (GLOVE) ×2 IMPLANT
GLOVE BIOGEL PI INDICATOR 7.5 (GLOVE)
GLOVE BIOGEL PI INDICATOR 8.5 (GLOVE) ×2
GLOVE SURG ORTHO 8.0 STRL STRW (GLOVE) ×4 IMPLANT
GOWN PREVENTION PLUS XLARGE (GOWN DISPOSABLE) ×4 IMPLANT
GOWN STRL NON-REIN LRG LVL3 (GOWN DISPOSABLE) ×4 IMPLANT
HANDPIECE INTERPULSE COAX TIP (DISPOSABLE) ×2
HOOD PEEL AWAY FACE SHEILD DIS (HOOD) ×8 IMPLANT
KIT BASIN OR (CUSTOM PROCEDURE TRAY) ×2 IMPLANT
KIT ROOM TURNOVER OR (KITS) ×2 IMPLANT
MANIFOLD NEPTUNE II (INSTRUMENTS) ×2 IMPLANT
NEEDLE 22X1 1/2 (OR ONLY) (NEEDLE) IMPLANT
NS IRRIG 1000ML POUR BTL (IV SOLUTION) ×2 IMPLANT
PACK TOTAL JOINT (CUSTOM PROCEDURE TRAY) ×2 IMPLANT
PAD ARMBOARD 7.5X6 YLW CONV (MISCELLANEOUS) ×4 IMPLANT
PADDING CAST COTTON 6X4 STRL (CAST SUPPLIES) ×2 IMPLANT
POSITIONER HEAD PRONE TRACH (MISCELLANEOUS) ×2 IMPLANT
SET HNDPC FAN SPRY TIP SCT (DISPOSABLE) ×1 IMPLANT
SPONGE GAUZE 4X4 12PLY (GAUZE/BANDAGES/DRESSINGS) ×2 IMPLANT
STAPLER VISISTAT 35W (STAPLE) ×2 IMPLANT
SUCTION FRAZIER TIP 10 FR DISP (SUCTIONS) ×2 IMPLANT
SUT BONE WAX W31G (SUTURE) ×2 IMPLANT
SUT VIC AB 0 CTB1 27 (SUTURE) ×4 IMPLANT
SUT VIC AB 1 CT1 27 (SUTURE) ×2
SUT VIC AB 1 CT1 27XBRD ANBCTR (SUTURE) ×1 IMPLANT
SUT VIC AB 2-0 CT1 27 (SUTURE) ×4
SUT VIC AB 2-0 CT1 TAPERPNT 27 (SUTURE) ×2 IMPLANT
SUT VLOC 180 0 24IN GS25 (SUTURE) ×2 IMPLANT
SYR CONTROL 10ML LL (SYRINGE) IMPLANT
TOWEL OR 17X24 6PK STRL BLUE (TOWEL DISPOSABLE) ×2 IMPLANT
TOWEL OR 17X26 10 PK STRL BLUE (TOWEL DISPOSABLE) ×2 IMPLANT
TRAY FOLEY CATH 14FR (SET/KITS/TRAYS/PACK) ×2 IMPLANT
WATER STERILE IRR 1000ML POUR (IV SOLUTION) ×6 IMPLANT

## 2011-12-14 NOTE — Progress Notes (Signed)
Orthopedic Tech Progress Note Patient Details:  Roberto Reid 1933-01-03 [id number]  CPM Left Knee CPM Left Knee: On Left Knee Flexion (Degrees): 90  Left Knee Extension (Degrees): 0    Betzayda Braxton T 12/14/2011, 10:07 AM

## 2011-12-14 NOTE — Anesthesia Procedure Notes (Addendum)
Anesthesia Regional Block:  Femoral nerve block  Pre-Anesthetic Checklist: ,, timeout performed, Correct Patient, Correct Site, Correct Laterality, Correct Procedure, Correct Position, site marked, Risks and benefits discussed,  Surgical consent,  Pre-op evaluation,  At surgeon's request and post-op pain management  Laterality: Left  Prep: chloraprep       Needles:  Injection technique: Single-shot  Needle Type: Echogenic Stimulator Needle     Needle Length: 5cm 5 cm     Additional Needles:  Procedures: ultrasound guided and nerve stimulator Femoral nerve block  Nerve Stimulator or Paresthesia:  Response: 0.4 mA,   Additional Responses:   Narrative:  Start time: 12/14/2011 7:10 AM End time: 12/14/2011 7:25 AM Injection made incrementally with aspirations every 5 mL.  Performed by: Personally  Anesthesiologist: Lillia Abed MD  Additional Notes: Monitors applied. Patient sedated. Sterile prep and drape,hand hygiene and sterile gloves were used. Relevant anatomy identified.Needle position confirmed.Local anesthetic injected incrementally after negative aspiration. Local anesthetic spread visualized around nerve(s). Vascular puncture avoided. No complications. Image printed for medical record.The patient tolerated the procedure well.       Femoral nerve block Procedure Name: LMA Insertion Date/Time: 12/14/2011 7:45 AM Performed by: Sampson Si E Pre-anesthesia Checklist: Patient identified, Timeout performed, Emergency Drugs available, Suction available and Patient being monitored Patient Re-evaluated:Patient Re-evaluated prior to inductionOxygen Delivery Method: Circle system utilized Preoxygenation: Pre-oxygenation with 100% oxygen Intubation Type: IV induction Ventilation: Mask ventilation without difficulty LMA: LMA inserted LMA Size: 4.0 Number of attempts: 1 Placement Confirmation: ETT inserted through vocal cords under direct vision,  breath sounds checked-  equal and bilateral and positive ETCO2 Tube secured with: Tape Dental Injury: Teeth and Oropharynx as per pre-operative assessment

## 2011-12-14 NOTE — Preoperative (Signed)
Beta Blockers   Reason not to administer Beta Blockers:Not Applicable 

## 2011-12-14 NOTE — Anesthesia Preprocedure Evaluation (Addendum)
Anesthesia Evaluation  Patient identified by MRN, date of birth, ID band Patient awake    Reviewed: Allergy & Precautions, H&P , NPO status , Patient's Chart, lab work & pertinent test results  Airway Mallampati: I TM Distance: >3 FB Neck ROM: Full    Dental  (+) Teeth Intact and Dental Advisory Given   Pulmonary asthma ,  breath sounds clear to auscultation        Cardiovascular hypertension, Pt. on medications Rhythm:Regular Rate:Normal     Neuro/Psych PSYCHIATRIC DISORDERS    GI/Hepatic GERD-  Medicated and Controlled,  Endo/Other  Type 2, Oral Hypoglycemic Agents  Renal/GU      Musculoskeletal   Abdominal   Peds  Hematology   Anesthesia Other Findings   Reproductive/Obstetrics                           Anesthesia Physical Anesthesia Plan  ASA: II  Anesthesia Plan: General   Post-op Pain Management:    Induction: Intravenous  Airway Management Planned: LMA  Additional Equipment:   Intra-op Plan:   Post-operative Plan: Extubation in OR  Informed Consent: I have reviewed the patients History and Physical, chart, labs and discussed the procedure including the risks, benefits and alternatives for the proposed anesthesia with the patient or authorized representative who has indicated his/her understanding and acceptance.     Plan Discussed with: Surgeon and CRNA  Anesthesia Plan Comments:         Anesthesia Quick Evaluation

## 2011-12-14 NOTE — Transfer of Care (Signed)
Immediate Anesthesia Transfer of Care Note  Patient: Roberto Reid  Procedure(s) Performed: Procedure(s) (LRB): TOTAL KNEE ARTHROPLASTY (Left)  Patient Location: PACU  Anesthesia Type: General  Level of Consciousness: oriented, patient cooperative and lethargic  Airway & Oxygen Therapy: Patient Spontanous Breathing and Patient connected to nasal cannula oxygen  Post-op Assessment: Report given to PACU RN  Post vital signs: Reviewed and stable  Complications: No apparent anesthesia complications

## 2011-12-14 NOTE — H&P (Signed)
Roberto Reid MRN:  [id number] DOB/SEX:  1933/02/15/male  CHIEF COMPLAINT:  Painful left Knee  HISTORY: Patient is a 78 y.o. male presented with a history of pain in the left knee. Onset of symptoms was gradual starting 9 years ago with gradually worsening course since that time. The patient noted no past surgery on the left knee. Prior procedures on the knee include arthroscopy. Patient has been treated conservatively with over-the-counter NSAIDs and activity modification. Patient currently rates pain in the knee at 9 out of 10 with activity. There is pain at night.  PAST MEDICAL HISTORY: Patient Active Problem List   Diagnosis Date Noted  . Preop cardiovascular exam 04/14/2011  . Hypertension 02/26/2011  . Hyperlipidemia 02/26/2011  . Diabetes mellitus 02/26/2011  . Osteoarthritis 02/26/2011  . GE reflux 02/26/2011  . Erectile dysfunction 02/26/2011   Past Medical History  Diagnosis Date  . GERD (gastroesophageal reflux disease)   . Hyperlipidemia   . Diabetes mellitus   . Hearing loss   . Hypertension     DR Jeoffrey Massed  . Asthma     AS CHILD ONLY   . Cellulitis   . Kidney stone   . Arthritis    Past Surgical History  Procedure Date  . Repair torn knee cartilage   . Eye surgery     cataract, bilateral 11/10  . Elbow surgery     RIGHT  . Tonsillectomy      MEDICATIONS:   Prescriptions prior to admission  Medication Sig Dispense Refill  . amLODipine (NORVASC) 5 MG tablet Take 5 mg by mouth daily.      Marland Kitchen atorvastatin (LIPITOR) 80 MG tablet Take 80 mg by mouth daily.      Marland Kitchen esomeprazole (NEXIUM) 40 MG capsule Take 40 mg by mouth daily before breakfast.      . HYDROcodone-acetaminophen (NORCO) 10-325 MG per tablet Take 1 tablet by mouth every 6 (six) hours as needed.      Marland Kitchen losartan (COZAAR) 100 MG tablet Take 100 mg by mouth daily.      . metFORMIN (GLUCOPHAGE) 500 MG tablet Take 500 mg by mouth daily with breakfast.      . NEXIUM 40 MG capsule TAKE ONE  CAPSULE BY MOUTH TWICE DAILY FOR SEVERE REFLUX  60 capsule  0  . Testosterone (ANDROGEL PUMP) 1.25 GM/ACT (1%) GEL Place 3 sprays onto the skin daily.      Marland Kitchen zolpidem (AMBIEN) 10 MG tablet Take 1 tablet (10 mg total) by mouth at bedtime as needed for sleep.  30 tablet  5  . Coenzyme Q10 (CO Q-10 PO) Take 1 tablet by mouth daily.      . sildenafil (VIAGRA) 100 MG tablet Take 100 mg by mouth daily as needed.        ALLERGIES:   Allergies  Allergen Reactions  . Percocet (Oxycodone-Acetaminophen)   . Sulfa Antibiotics     REVIEW OF SYSTEMS:  Pertinent items are noted in HPI.   FAMILY HISTORY:  History reviewed. No pertinent family history.  SOCIAL HISTORY:   History  Substance Use Topics  . Smoking status: Former Smoker    Types: Cigarettes    Quit date: 12/14/1991  . Smokeless tobacco: Never Used  . Alcohol Use: Yes     social     EXAMINATION:  Vital signs in last 24 hours: Temp:  [98.1 F (36.7 C)] 98.1 F (36.7 C) (07/15 0603) Pulse Rate:  [84] 84  (07/15 0603) Resp:  [  20] 20  (07/15 0603) BP: (133)/(81) 133/81 mmHg (07/15 0603) SpO2:  [96 %] 96 % (07/15 0603)  General appearance: alert, cooperative and no distress Lungs: clear to auscultation bilaterally Heart: regular rate and rhythm, S1, S2 normal, no murmur, click, rub or gallop Abdomen: soft, non-tender; bowel sounds normal; no masses,  no organomegaly Extremities: Homans sign is negative, no sign of DVT Pulses: 2+ and symmetric Skin: Skin color, texture, turgor normal. No rashes or lesions Neurologic: Alert and oriented X 3, normal strength and tone. Normal symmetric reflexes. Normal coordination and gait  Musculoskeletal:  ROM 0-115, Ligaments intact,  Imaging Review Plain radiographs demonstrate severe degenerative joint disease of the left knee. The overall alignment is mild valgus. The bone quality appears to be excellent for age and reported activity level.  Assessment/Plan: End stage arthritis, left  knee   The patient history, physical examination and imaging studies are consistent with advanced degenerative joint disease of the left knee. The patient has failed conservative treatment.  The clearance notes were reviewed.  After discussion with the patient it was felt that Total Knee Replacement was indicated. The procedure,  risks, and benefits of total knee arthroplasty were presented and reviewed. The risks including but not limited to aseptic loosening, infection, blood clots, vascular injury, stiffness, patella tracking problems complications among others were discussed. The patient acknowledged the explanation, agreed to proceed with the plan.  Roberto Reid 12/14/2011, 7:03 AM

## 2011-12-14 NOTE — Anesthesia Postprocedure Evaluation (Signed)
Anesthesia Post Note  Patient: Roberto Reid  Procedure(s) Performed: Procedure(s) (LRB): TOTAL KNEE ARTHROPLASTY (Left)  Anesthesia type: general  Patient location: PACU  Post pain: Pain level controlled  Post assessment: Patient's Cardiovascular Status Stable  Last Vitals:  Filed Vitals:   12/14/11 0950  BP: 159/85  Pulse: 95  Temp:   Resp: 13    Post vital signs: Reviewed and stable  Level of consciousness: sedated  Complications: No apparent anesthesia complications

## 2011-12-15 ENCOUNTER — Encounter (HOSPITAL_COMMUNITY): Payer: Self-pay | Admitting: Orthopedic Surgery

## 2011-12-15 LAB — CBC
Hemoglobin: 11 g/dL — ABNORMAL LOW (ref 13.0–17.0)
MCHC: 34 g/dL (ref 30.0–36.0)
RBC: 3.43 MIL/uL — ABNORMAL LOW (ref 4.22–5.81)

## 2011-12-15 LAB — GLUCOSE, CAPILLARY
Glucose-Capillary: 118 mg/dL — ABNORMAL HIGH (ref 70–99)
Glucose-Capillary: 115 mg/dL — ABNORMAL HIGH (ref 70–99)

## 2011-12-15 LAB — BASIC METABOLIC PANEL
GFR calc non Af Amer: 43 mL/min — ABNORMAL LOW (ref >90)
Glucose, Bld: 127 mg/dL — ABNORMAL HIGH (ref 70–99)
Potassium: 3.8 mEq/L (ref 3.5–5.1)
Sodium: 137 mEq/L (ref 135–145)

## 2011-12-15 NOTE — Op Note (Signed)
TOTAL KNEE REPLACEMENT OPERATIVE NOTE:  12/14/2011  3:59 PM  PATIENT:  Roberto Reid  78 y.o. male  PRE-OPERATIVE DIAGNOSIS:  osteoarthritis left knee  POST-OPERATIVE DIAGNOSIS:  osteoarthritis left knee  PROCEDURE:  Procedure(s): TOTAL KNEE ARTHROPLASTY  SURGEON:  Surgeon(s): Rudean Haskell, MD  PHYSICIAN ASSISTANT: Carlynn Spry, [facility]  ANESTHESIA:   general  DRAINS: Hemovac and On-Q Marcaine Pain Pump  SPECIMEN: None  COUNTS:  Correct  TOURNIQUET:   Total Tourniquet Time Documented: Thigh (Left) - 52 minutes  DICTATION:  Indication for procedure:    The patient is a 78 y.o. male who has failed conservative treatment for osteoarthritis left knee.  Informed consent was obtained prior to anesthesia. The risks versus benefits of the operation were explain and in a way the patient can, and did, understand.   Description of procedure:     The patient was taken to the operating room and placed under anesthesia.  The patient was positioned in the usual fashion taking care that all body parts were adequately padded and/or protected.  I foley catheter was not placed.  A tourniquet was applied and the leg prepped and draped in the usual sterile fashion.  The extremity was exsanguinated with the esmarch and tourniquet inflated to 350 mmHg.  Pre-operative range of motion was normal.  The knee was in 5 degree of neutral.  A midline incision approximately 6-7 inches long was made with a #10 blade.  A new blade was used to make a parapatellar arthrotomy going 2-3 cm into the quadriceps tendon, over the patella, and alongside the medial aspect of the patellar tendon.  A synovectomy was then performed with the #10 blade and forceps. I then elevated the deep MCL off the medial tibial metaphysis subperiosteally around to the semimembranosus attachment.    I everted the patella and used calipers to measure patellar thickness.  I used the reamer to ream down to appropriate thickness to  recreate the native thickness.  I then removed excess bone with the rongeur and sagittal saw.  I used the appropriately sized template and drilled the three lug holes.  I then put the trial in place and measured the thickness with the calipers to ensure recreation of the native thickness.  The trial was then removed and the patella subluxed and the knee brought into flexion.  A homan retractor was place to retract and protect the patella and lateral structures.  A Z-retractor was place medially to protect the medial structures.  The extra-medullary alignment system was used to make cut the tibial articular surface perpendicular to the anamotic axis of the tibia and in 3 degrees of posterior slope.  The cut surface and alignment jig was removed.  I then used the intramedullary alignment guide to make a 6 valgus cut on the distal femur.  I then marked out the epicondylar axis on the distal femur.  The posterior condylar axis measured 3 degrees.  I then used the anterior referencing sizer and measured the femur to be a size E.  The 4-In-1 cutting block was screwed into place in external rotation matching the posterior condylar angle, making our cuts perpendicular to the epicondylar axis.  Anterior, posterior and chamfer cuts were made with the sagittal saw.  The cutting block and cut pieces were removed.  A lamina spreader was placed in 90 degrees of flexion.  The ACL, PCL, menisci, and posterior condylar osteophytes were removed.  A 10 mm spacer blocked was found to offer good flexion  and extension gap balance after minimal in degree releasing.   The scoop retractor was then placed and the femoral finishing block was pinned in place.  The small sagittal saw was used as well as the lug drill to finish the femur.  The block and cut surfaces were removed and the medullary canal hole filled with autograft bone from the cut pieces.  The tibia was delivered forward in deep flexion and external rotation.  A size 5  tray was selected and pinned into place centered on the medial 1/3 of the tibial tubercle.  The reamer and keel was used to prepare the tibia through the tray.    I then trialed with the size E femur, size 5 tibia, a 10 mm insert and the 35 patella.  I had excellent flexion/extension gap balance, excellent patella tracking.  Flexion was full and beyond 120 degrees; extension was zero.  These components were chosen and the staff opened them to me on the back table while the knee was lavaged copiously and the cement mixed.  I cemented in the components and removed all excess cement.  The polyethylene tibial component was snapped into place and the knee placed in extension while cement was hardening.  The capsule was infilltrated with 20cc of .25% Marcaine with epinephrine.  A hemovac was place in the joint exiting superolaterally.  A pain pump was place superomedially superficial to the arthrotomy.  Once the cement was hard, the tourniquet was let down.  Hemostasis was obtained.  The arthrotomy was closed with figure-8 #1 vicryl sutures.  The deep soft tissues were closed with #0 vicryls and the subcuticular layer closed with a running #2-0 vicryl.  The skin was reapproximated and closed with skin staples.  The wound was dressed with xeroform, 4 x4's, 2 ABD sponges, a single layer of webril and a TED stocking.   The patient was then awakened, extubated, and taken to the recovery room in stable condition.  BLOOD LOSS:  300cc DRAINS: 1 hemovac, 1 pain catheter COMPLICATIONS:  None.  PLAN OF CARE: Admit to inpatient   PATIENT DISPOSITION:  PACU - hemodynamically stable.   Delay start of Pharmacological VTE agent (>24hrs) due to surgical blood loss or risk of bleeding:  not applicable  Please fax a copy of this op note to my office at [phone] (please only include page 1 and 2 of the Case Information op note)

## 2011-12-15 NOTE — Evaluation (Signed)
Physical Therapy Evaluation Patient Details Name: Roberto Reid MRN: [id number] DOB: [birth year] Today's Date: 12/15/2011 Time:  -     PT Assessment / Plan / Recommendation Clinical Impression  Pt is 78 y/o male admitted for s/p left TKA.  Pt limited by pain but able to ambulate this session.  Pt will benefit from acute PT services to improve overall mobility.    PT Assessment  Patient needs continued PT services    Follow Up Recommendations  Home health PT    Barriers to Discharge        Equipment Recommendations  None recommended by PT    Recommendations for Other Services     Frequency 7X/week    Precautions / Restrictions Restrictions Weight Bearing Restrictions: Yes LLE Weight Bearing: Weight bearing as tolerated   Pertinent Vitals/Pain 7/10 left knee pain      Mobility  Bed Mobility Bed Mobility: Supine to Sit Supine to Sit: 4: Min assist Details for Bed Mobility Assistance: (A) with Left LE OOB with cues for proper technique  Transfers Transfers: Sit to Stand;Stand to Sit Sit to Stand: 4: Min assist;From bed Stand to Sit: 4: Min assist;To chair/3-in-1 Details for Transfer Assistance: (A) to initiate and slowly descend to recliner with max cues for hand and left LE placement during transfers Ambulation/Gait Ambulation/Gait Assistance: 4: Min assist Ambulation Distance (Feet): 15 Feet Assistive device: Rolling walker Ambulation/Gait Assistance Details: (A) to maintain balance and manage RW.  VCs for proper step sequence.  Limited ambulation due to pain Gait Pattern: Step-to pattern;Decreased stride length;Shuffle;Antalgic    Exercises Total Joint Exercises Ankle Circles/Pumps: AAROM;Both;10 reps Quad Sets: AAROM;Strengthening;Left;10 reps Heel Slides: AAROM;Left;10 reps   PT Diagnosis: Difficulty walking;Generalized weakness;Acute pain  PT Problem List: Decreased strength;Decreased range of motion;Decreased activity tolerance;Decreased knowledge of use of  DME;Pain PT Treatment Interventions: DME instruction;Gait training;Stair training;Functional mobility training;Therapeutic activities;Therapeutic exercise;Patient/family education   PT Goals Acute Rehab PT Goals PT Goal Formulation: With patient Time For Goal Achievement: 12/22/11 Potential to Achieve Goals: Good Pt will go Supine/Side to Sit: with modified independence PT Goal: Supine/Side to Sit - Progress: Goal set today Pt will go Sit to Stand: with modified independence PT Goal: Sit to Stand - Progress: Goal set today Pt will go Stand to Sit: with modified independence PT Goal: Stand to Sit - Progress: Goal set today Pt will Ambulate: >150 feet;with modified independence;with rolling walker PT Goal: Ambulate - Progress: Goal set today Pt will Go Up / Down Stairs: with min assist;with least restrictive assistive device;3-5 stairs PT Goal: Up/Down Stairs - Progress: Goal set today  Visit Information  Last PT Received On: 12/15/11    Subjective Data      Prior Functioning  Home Living Lives With: Spouse Available Help at Discharge: Family Type of Home: House Home Access: Stairs to enter [employer] of Steps: 5 Entrance Stairs-Rails: Right;Left Home Layout: Two level;Full bath on main level Bathroom Shower/Tub: Walk-in shower;Door [employer] Toilet: Programmer, systems: Yes Home Adaptive Equipment: Bedside commode/3-in-1;Walker - rolling Prior Function Level of Independence: Independent Able to Take Stairs?: Yes Driving: Yes 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 difficulties Dominant Hand: Right    Cognition  Overall Cognitive Status: Appears within functional limits for tasks assessed/performed Arousal/Alertness: Awake/alert Orientation Level: Appears intact for tasks assessed Behavior During Session: [facility] for tasks performed    Extremity/Trunk Assessment Right Lower Extremity Assessment RLE ROM/Strength/Tone: Within functional  levels Left Lower Extremity Assessment LLE ROM/Strength/Tone: Unable to fully assess;Due to pain;Deficits LLE  ROM/Strength/Tone Deficits: AAROM knee flexion 5 -45 degrees   Balance    End of Session PT - End of Session Equipment Utilized During Treatment: Gait belt Activity Tolerance: Patient tolerated treatment well Patient left: in chair;with call bell/phone within reach Nurse Communication: Mobility status CPM Left Knee CPM Left Knee: Off  GP     Shilpa Bushee 12/15/2011, 10:03 AM Antoine Poche, PT DPT [phone]

## 2011-12-15 NOTE — Progress Notes (Signed)
PT progress NOTE   12/15/11 1500  PT Visit Information  Last PT Received On 12/15/11  Assistance Needed +1  PT Time Calculation  PT Start Time 1342  PT Stop Time 1409  PT Time Calculation (min) 27 min  Precautions  Precautions Knee  Restrictions  Weight Bearing Restrictions Yes  LLE Weight Bearing WBAT  Cognition  Overall Cognitive Status Appears within functional limits for tasks assessed/performed  Arousal/Alertness Awake/alert  Orientation Level Appears intact for tasks assessed  Behavior During Session [facility] for tasks performed  Bed Mobility  Bed Mobility Supine to Sit;Sit to Supine  Supine to Sit 4: Min assist;With rails;HOB elevated  Sit to Supine 4: Min assist;With rail  Details for Bed Mobility Assistance (A) with left LE OOB with cues for proper technique and (A) with LLE into bed   Transfers  Transfers Sit to Stand;Stand to Sit  Sit to Stand 4: Min assist;From bed  Stand to Sit 4: Min assist;To bed  Details for Transfer Assistance (A) to initiate and slowly descend to recliner with max cues for hand and left LE placement during transfers  Ambulation/Gait  Ambulation/Gait Assistance 4: Min assist  Ambulation Distance (Feet) 40 Feet  Assistive device Rolling walker  Ambulation/Gait Assistance Details (A) to maintain balance and cues for proper step sequence.  Cues to stand upright with proper RW placement.  Gait Pattern Step-to pattern;Decreased stride length;Shuffle;Antalgic  PT - End of Session  Equipment Utilized During Treatment Gait belt  Activity Tolerance Patient tolerated treatment well  Patient left in bed;with call bell/phone within reach  Nurse Communication Mobility status  PT - Assessment/Plan  Comments on Treatment Session Pt able to increase ambulation distance.  Continues to be limited due to pain.  Pt will need to perform stair negotiation next session.  PT Plan Discharge plan remains appropriate;Frequency remains appropriate  PT Frequency 7X/week    Follow Up Recommendations Home health PT  Equipment Recommended None recommended by PT  Acute Rehab PT Goals  PT Goal Formulation With patient  Time For Goal Achievement 12/22/11  Potential to Achieve Goals Good  Pt will go Supine/Side to Sit with modified independence  PT Goal: Supine/Side to Sit - Progress Progressing toward goal  Pt will go Sit to Stand with modified independence  PT Goal: Sit to Stand - Progress Progressing toward goal  Pt will go Stand to Sit with modified independence  PT Goal: Stand to Sit - Progress Progressing toward goal  Pt will Ambulate >150 feet;with modified independence;with rolling walker  PT Goal: Ambulate - Progress Progressing toward goal  PT General Charges  $$ ACUTE PT VISIT 1 Procedure  PT Treatments  $Gait Training 8-22 mins  $Therapeutic Activity 8-22 mins    [place], [place] DPT [phone]

## 2011-12-15 NOTE — Progress Notes (Signed)
Lara Mulch, MD   Carlynn Spry, PA-C [place], [place], [place]  [address]                             [phone]   PROGRESS NOTE  Subjective:  negative for Chest Pain  negative for Shortness of Breath  negative for Nausea/Vomiting   negative for Calf Pain  negative for Bowel Movement   Tolerating Diet: yes         Patient reports pain as 5 on 0-10 scale.    Objective: Vital signs in last 24 hours:   Patient Vitals for the past 24 hrs:  BP Temp Pulse Resp SpO2 Height Weight  12/15/11 0615 131/62 mmHg 98.4 F (36.9 C) 87  18  94 % - -  12/14/11 2054 123/53 mmHg 98.3 F (36.8 C) 88  18  92 % - -  12/14/11 2020 - - - - - 5\' 10"  (1.778 m) 87.998 kg (194 lb)  12/14/11 1538 - - - 18  - - -  12/14/11 1348 107/59 mmHg 97.8 F (36.6 C) 83  16  94 % - -  12/14/11 1200 - - - 16  - - -  12/14/11 1047 154/79 mmHg 97.5 F (36.4 C) 92  16  95 % - -  12/14/11 1030 - - 90  14  97 % - -  12/14/11 1015 - - 92  10  96 % - -  12/14/11 1005 153/82 mmHg - 93  13  92 % - -  12/14/11 1000 - - 95  18  93 % - -  12/14/11 0950 159/85 mmHg - 95  13  95 % - -  12/14/11 0948 - - 96  17  94 % - -  12/14/11 0944 - 98.1 F (36.7 C) - - - - -    @flow {1959:LAST@   Intake/Output from previous day:   07/15 0701 - 07/16 0700 In: 3040 [P.O.:840; I.V.:2200] Out: 1700 [Urine:900; Drains:750]   Intake/Output this shift:       Intake/Output      07/15 0701 - 07/16 0700 07/16 0701 - 07/17 0700   P.O. 840    I.V. (mL/kg) 2200 (25)    Total Intake(mL/kg) 3040 (34.5)    Urine (mL/kg/hr) 900 (0.4)    Drains 750    Blood 50    Total Output 1700    Net +1340            LABORATORY DATA:  Basename 12/15/11 0600  WBC 7.4  HGB 11.0*  HCT 32.4*  PLT 160    Basename 12/15/11 0600  NA 137  K 3.8  CL 103  CO2 26  BUN 22  CREATININE 1.51*  GLUCOSE 127*  CALCIUM 7.5*   Lab Results  Component Value Date   INR 0.99 12/07/2011   INR 1.01 11/09/2011   INR 1.2 02/06/2007     Examination:  General appearance: alert, cooperative and no distress Extremities: extremities normal, atraumatic, no cyanosis or edema and Homans sign is negative, no sign of DVT  Wound Exam: clean, dry, intact   Drainage:  None: wound tissue dry  Motor Exam: EHL and FHL Intact  Sensory Exam: Deep Peroneal normal  Vascular Exam:    Assessment:    1 Day Post-Op  Procedure(s) (LRB): TOTAL KNEE ARTHROPLASTY (Left)  ADDITIONAL DIAGNOSIS:  Active Problems:  * No active hospital problems. *   Acute Blood Loss  Anemia   Plan: Physical Therapy as ordered Weight Bearing as Tolerated (WBAT)  DVT Prophylaxis:  Lovenox  DISCHARGE PLAN: Home  DISCHARGE NEEDS: HHPT, CPM, Walker and 3-in-1 comode seat         Roberto Reid 12/15/2011, 8:16 AM

## 2011-12-15 NOTE — Care Management Note (Signed)
    Page 1 of 1   12/15/2011     10:01:48 AM   CARE MANAGEMENT NOTE 12/15/2011  Patient:  Roberto Reid, Roberto Reid   Account Number:  [id number]  Date Initiated:  12/14/2011  Documentation initiated by:  Rozanna Boer  Subjective/Objective Assessment:   Operative day for left TKA  needs [facility] services and DME     Action/Plan:   Home with [facility] services  Has DME at home  [facility] services arranged by MD office.   Anticipated DC Date:  12/16/2011   Anticipated DC Plan:  [place]  CM consult      Choice offered to / List presented to:             Status of service:   Medicare Important Message given?   (If response is "NO", the following Medicare IM given date fields will be blank) Date Medicare IM given:   Date Additional Medicare IM given:    Discharge Disposition:    Per UR Regulation:  Reviewed for med. necessity/level of care/duration of stay  If discussed at [place] of Stay Meetings, dates discussed:    Comments:  12/15/11   09:59  Rozanna Boer RN/CM Patient agreeable to [facility] for [facility] services Per Mr. Pridgeon he has 3n1/RW and CPM at home. Wife will assist at home.   12/14/11  15:43  Rozanna Boer RN/CM [facility] services pre-arranged by MD office with [facility].

## 2011-12-15 NOTE — Progress Notes (Signed)
Patient has refused to use the yellow foam block under his heel all night. Benefits of using this was explained to this as well as the strict use of this by Dr. Ronnie Derby. Pt continued to refuse use, even with pain meds given. Pt would only allow a thin foam under his heel. Will continue to monitor.

## 2011-12-15 NOTE — Progress Notes (Signed)
Utilization review completed. Inola Lisle, RN, BSN. 

## 2011-12-15 NOTE — Progress Notes (Signed)
Occupational Therapy Evaluation Patient Details Name: Roberto Reid MRN: [id number] DOB: [birth year] Today's Date: 12/15/2011 Time: 1422-     OT Assessment / Plan / Recommendation Clinical Impression  78 yo s/p TKA. Will see in am to continue to assess in order to increase level of independence with ADL and g=funcitonal mobility for ADL to faciliate safe D/C home. Pt's wife will be able to provide 24/7 A after D/C per pt.    OT Assessment  Patient needs continued OT Services    Follow Up Recommendations  No OT follow up    Barriers to Discharge None    Equipment Recommendations  None recommended by PT    Recommendations for Other Services    Frequency  Min 2X/week    Precautions / Restrictions Precautions Precautions: Knee Restrictions Weight Bearing Restrictions: Yes LLE Weight Bearing: Weight bearing as tolerated   Pertinent Vitals/Pain 3    ADL  Grooming: Simulated;Set up Upper Body Bathing: Simulated;Set up Where Assessed - Upper Body Bathing: Supported sitting Lower Body Bathing: Simulated;Minimal assistance Where Assessed - Lower Body Bathing: Supported sitting Upper Body Dressing: Simulated;Set up Lower Body Dressing: Simulated;Moderate assistance ADL Comments: Pt refused to get OOB to completet eval. will complete mobility section in am.    OT Diagnosis: Generalized weakness;Acute pain  OT Problem List: Decreased strength;Decreased range of motion;Decreased activity tolerance;Decreased knowledge of use of DME or AE;Decreased knowledge of precautions;Pain OT Treatment Interventions: Self-care/ADL training;Energy conservation;DME and/or AE instruction;Therapeutic activities;Patient/family education   OT Goals Acute Rehab OT Goals OT Goal Formulation: With patient Time For Goal Achievement: 12/22/11 Potential to Achieve Goals: Good ADL Goals Pt Will Perform Lower Body Bathing: with supervision;with caregiver independent in assisting;Sit to stand from  chair;Unsupported ADL Goal: Lower Body Bathing - Progress: Goal set today Pt Will Perform Lower Body Dressing: with supervision;Sit to stand from chair;Unsupported;with caregiver independent in assisting ADL Goal: Lower Body Dressing - Progress: Goal set today Pt Will Transfer to Toilet: with modified independence;Ambulation;Comfort height toilet;3-in-1 ADL Goal: Toilet Transfer - Progress: Goal set today Pt Will Perform Toileting - Clothing Manipulation: with modified independence;Standing ADL Goal: Toileting - Clothing Manipulation - Progress: Goal set today Pt Will Perform Toileting - Hygiene: with modified independence;Sit to stand from 3-in-1/toilet ADL Goal: Toileting - Hygiene - Progress: Goal set today Pt Will Perform Tub/Shower Transfer: with supervision;with caregiver independent in assisting ADL Goal: Tub/Shower Transfer - Progress: Goal set today  Visit Information  Last OT Received On: 12/15/11 Assistance Needed: +1    Subjective Data      Prior Functioning  Vision/Perception  Home Living Lives With: Spouse Available Help at Discharge: Family Type of Home: House Home Access: Stairs to enter [employer] of Steps: 5 (2 STA at back - R rail) Entrance Stairs-Rails: Right;Left Home Layout: Two level;Full bath on main level Bathroom Shower/Tub: Walk-in shower;Door [employer] Toilet: Programmer, systems: Yes How Accessible: Accessible via walker Home Adaptive Equipment: Bedside commode/3-in-1;Walker - rolling Prior Function Level of Independence: Independent Able to Take Stairs?: Yes Driving: Yes 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 difficulties Dominant Hand: Right      Cognition  Overall Cognitive Status: Appears within functional limits for tasks assessed/performed Arousal/Alertness: Awake/alert Orientation Level: Appears intact for tasks assessed Behavior During Session: [facility] for tasks performed    Extremity/Trunk  Assessment Right Upper Extremity Assessment RUE ROM/Strength/Tone: Within functional levels Left Upper Extremity Assessment LUE ROM/Strength/Tone: WFL for tasks assessed (Soreness in l shoulder) Trunk Assessment Trunk Assessment: Normal   Mobility Bed Mobility  Bed Mobility: Supine to Sit;Sit to Supine Supine to Sit: 4: Min assist;With rails;HOB elevated Sit to Supine: 4: Min assist;With rail Details for Bed Mobility Assistance: (A) with left LE OOB with cues for proper technique and (A) with LLE into bed  Transfers Sit to Stand: 4: Min assist;From bed Stand to Sit: 4: Min assist;To bed Details for Transfer Assistance: (A) to initiate and slowly descend to recliner with max cues for hand and left LE placement during transfers   Exercise    Balance    End of Session OT - End of Session Activity Tolerance: Patient tolerated treatment well Patient left: in bed;with call bell/phone within reach  GO     [facility] 12/15/2011, 3:57 PM [facility], OTR/L  [phone] 12/15/2011

## 2011-12-15 NOTE — Plan of Care (Signed)
Problem: Consults Goal: Diagnosis- Total Joint Replacement Outcome: Completed/Met Date Met:  12/15/11 Primary Total Knee

## 2011-12-16 LAB — CBC
HCT: 31.5 % — ABNORMAL LOW (ref 39.0–52.0)
MCHC: 34.6 g/dL (ref 30.0–36.0)
MCV: 94.9 fL (ref 78.0–100.0)
Platelets: 162 10*3/uL (ref 150–400)
RDW: 12.7 % (ref 11.5–15.5)

## 2011-12-16 LAB — BASIC METABOLIC PANEL
BUN: 15 mg/dL (ref 6–23)
Calcium: 7.4 mg/dL — ABNORMAL LOW (ref 8.4–10.5)
Creatinine, Ser: 1.33 mg/dL (ref 0.50–1.35)
GFR calc Af Amer: 58 mL/min — ABNORMAL LOW (ref >90)
GFR calc non Af Amer: 50 mL/min — ABNORMAL LOW (ref >90)

## 2011-12-16 LAB — GLUCOSE, CAPILLARY: Glucose-Capillary: 135 mg/dL — ABNORMAL HIGH (ref 70–99)

## 2011-12-16 MED ORDER — METHOCARBAMOL 500 MG PO TABS: 500.0000 mg | ORAL_TABLET | Freq: Four times a day (QID) | ORAL | Status: AC | PRN

## 2011-12-16 MED ORDER — ENOXAPARIN SODIUM 40 MG/0.4ML ~~LOC~~ SOLN: 40.0000 mg | Freq: Every day | SUBCUTANEOUS | Status: DC

## 2011-12-16 MED ORDER — OXYCODONE HCL 10 MG PO TB12: 10.0000 mg | ORAL_TABLET | Freq: Two times a day (BID) | ORAL | Status: DC

## 2011-12-16 MED ORDER — HYDROCODONE-ACETAMINOPHEN 10-325 MG PO TABS: 1.0000 | ORAL_TABLET | ORAL | Status: AC | PRN

## 2011-12-16 NOTE — Discharge Summary (Signed)
Lara Mulch, MD   Carlynn Spry, PA-C [place], [place], [place]  [address]                             [phone]  PATIENT ID: Roberto Reid        MRN:  [id number]          DOB/AGE: 76/20/1934 / 76 y.o.    DISCHARGE SUMMARY  ADMISSION DATE:    12/14/2011 DISCHARGE DATE:   12/16/2011   ADMISSION DIAGNOSIS: osteoarthritis left knee    DISCHARGE DIAGNOSIS:  osteoarthritis left knee    ADDITIONAL DIAGNOSIS: Active Problems:  * No active hospital problems. *   Past Medical History  Diagnosis Date  . GERD (gastroesophageal reflux disease)   . Hyperlipidemia   . Hearing loss   . Hypertension     DR Jeoffrey Massed  . Asthma     AS CHILD ONLY   . Cellulitis   . Kidney stone   . Arthritis   . Pneumonia     hx of PNA  . Diabetes mellitus     PROCEDURE: Procedure(s): TOTAL KNEE ARTHROPLASTY on 12/14/2011  CONSULTS:     HISTORY:  See H&P in chart  HOSPITAL COURSE:  JAYDYN SCHWARTZENBERGE is a 78 y.o. admitted on 12/14/2011 and found to have a diagnosis of osteoarthritis left knee.  After appropriate laboratory studies were obtained  they were taken to the operating room on 12/14/2011 and underwent Procedure(s): TOTAL KNEE ARTHROPLASTY.   They were given perioperative antibiotics:  Anti-infectives     Start     Dose/Rate Route Frequency Ordered Stop   12/14/11 1400   ceFAZolin (ANCEF) IVPB 1 g/50 mL premix        1 g 100 mL/hr over 30 Minutes Intravenous Every 6 hours 12/14/11 1053 12/14/11 2049   12/13/11 1248   ceFAZolin (ANCEF) IVPB 2 g/50 mL premix        2 g 100 mL/hr over 30 Minutes Intravenous 60 min pre-op 12/13/11 1248 12/14/11 0746        .  Tolerated the procedure well.  Placed with a foley intraoperatively.  Given Ofirmev at induction and for 48 hours.    POD #1, allowed out of bed to a chair.  PT for ambulation and exercise program.  Foley D/C'd in morning.  IV saline locked.  O2 discontionued.  POD #2, continued PT and ambulation.    Hemovac pulled. .  The remainder of the hospital course was dedicated to ambulation and strengthening.   The patient was discharged on 2 Days Post-Op in  Good condition.  Blood products given:none  DIAGNOSTIC STUDIES: Recent vital signs: Patient Vitals for the past 24 hrs:  BP Temp Pulse Resp SpO2  12/16/11 1023 141/65 mmHg - - - -  12/16/11 0621 132/52 mmHg 98.6 F (37 C) 80  20  100 %  January 12, 2012 2257 126/49 mmHg 98.5 F (36.9 C) 88  20  100 %  2012-01-12 2000 - - - 18  94 %  12-Jan-2012 1600 - - - 20  94 %       Recent laboratory studies:  Basename 12/16/11 0615 2012-01-12 0600  WBC 6.1 7.4  HGB 10.9* 11.0*  HCT 31.5* 32.4*  PLT 162 160    Basename 12/16/11 0615 01/12/2012 0600  NA 136 137  K 3.7 3.8  CL 100 103  CO2 26 26  BUN 15  22  CREATININE 1.33 1.51*  GLUCOSE 117* 127*  CALCIUM 7.4* 7.5*   Lab Results  Component Value Date   INR 0.99 12/07/2011   INR 1.01 11/09/2011   INR 1.2 02/06/2007     Recent Radiographic Studies :  Dg Chest 2 View  12/07/2011  *RADIOLOGY REPORT*  Clinical Data: 78 year old male preoperative study for knee replacement.  Hypertension.  CHEST - 2 VIEW  Comparison: 06/03/2011 and earlier.  Findings: Stable lung volumes.  Cardiac size and mediastinal contours are within normal limits.  Visualized tracheal air column is within normal limits.  No pneumothorax, pulmonary edema, pleural effusion or acute pulmonary opacity. No acute osseous abnormality identified.  IMPRESSION: No acute cardiopulmonary abnormality.  Original Report Authenticated By: Randall An, M.D.    DISCHARGE INSTRUCTIONS: Discharge Orders    Future Orders Please Complete By Expires   Diet - low sodium heart healthy      Call MD / Call 911      Comments:   If you experience chest pain or shortness of breath, CALL 911 and be transported to the hospital emergency room.  If you develope a fever above 101 F, pus (white drainage) or increased drainage or redness at the wound, or calf pain,  call your surgeon's office.   Constipation Prevention      Comments:   Drink plenty of fluids.  Prune juice may be helpful.  You may use a stool softener, such as Colace (over the counter) 100 mg twice a day.  Use MiraLax (over the counter) for constipation as needed.   Increase activity slowly as tolerated      Driving restrictions      Comments:   No driving for 6 weeks   Lifting restrictions      Comments:   No lifting for 6 weeks   CPM      Comments:   Continuous passive motion machine (CPM):      Use the CPM from 0 to 90 for 6-8 hours per day.      You may increase by 10 per day.  You may break it up into 2 or 3 sessions per day.      Use CPM for 2 weeks or until you are told to stop.   TED hose      Comments:   Use stockings (TED hose) for 3 weeks on both leg(s).  You may remove them at night for sleeping.   Change dressing      Comments:   Change dressing on thursday, then change the dressing daily with sterile 4 x 4 inch gauze dressing and apply TED hose.  You may clean the incision with alcohol prior to redressing.   Do not put a pillow under the knee. Place it under the heel.         DISCHARGE MEDICATIONS:   Medication List  As of 12/16/2011  1:59 PM   TAKE these medications         amLODipine 5 MG tablet   Commonly known as: NORVASC   Take 5 mg by mouth daily.      ANDROGEL PUMP 1.25 GM/ACT (1%) Gel   Generic drug: Testosterone   Place 3 sprays onto the skin daily.      atorvastatin 80 MG tablet   Commonly known as: LIPITOR   Take 80 mg by mouth daily.      CO Q-10 PO   Take 1 tablet by mouth daily.  enoxaparin 40 MG/0.4ML injection   Commonly known as: LOVENOX   Inject 0.4 mLs (40 mg total) into the skin daily.      NEXIUM 40 MG capsule   Generic drug: esomeprazole   TAKE ONE CAPSULE BY MOUTH TWICE DAILY FOR SEVERE REFLUX      esomeprazole 40 MG capsule   Commonly known as: NEXIUM   Take 40 mg by mouth daily before breakfast.       HYDROcodone-acetaminophen 10-325 MG per tablet   Commonly known as: NORCO   Take 1-2 tablets by mouth every 4 (four) hours as needed for pain (breakthrough pain).      losartan 100 MG tablet   Commonly known as: COZAAR   Take 100 mg by mouth daily.      metFORMIN 500 MG tablet   Commonly known as: GLUCOPHAGE   Take 500 mg by mouth daily with breakfast.      methocarbamol 500 MG tablet   Commonly known as: ROBAXIN   Take 1-2 tablets (500-1,000 mg total) by mouth every 6 (six) hours as needed.      oxyCODONE 10 MG 12 hr tablet   Commonly known as: OXYCONTIN   Take 1 tablet (10 mg total) by mouth every 12 (twelve) hours.      sildenafil 100 MG tablet   Commonly known as: VIAGRA   Take 100 mg by mouth daily as needed.      zolpidem 10 MG tablet   Commonly known as: AMBIEN   Take 1 tablet (10 mg total) by mouth at bedtime as needed for sleep.            FOLLOW UP VISIT:   Follow-up Information    Follow up with Rudean Haskell, MD. Call on 12/29/2011.   Contact information:   [place] [phone]          DISPOSITION:  Home    CONDITION:  Good   Dorlisa Savino 12/16/2011, 1:59 PM

## 2011-12-16 NOTE — Progress Notes (Signed)
  Lara Mulch, MD   Carlynn Spry, PA-C [place], [place], [place]  [address]                             [phone]   PROGRESS NOTE  Subjective:  negative for Chest Pain  negative for Shortness of Breath  negative for Nausea/Vomiting   negative for Calf Pain  negative for Bowel Movement   Tolerating Diet: yes         Patient reports pain as 4 on 0-10 scale.    Objective: Vital signs in last 24 hours:   Patient Vitals for the past 24 hrs:  BP Temp Pulse Resp SpO2  12/16/11 1023 141/65 mmHg - - - -  12/16/11 0621 132/52 mmHg 98.6 F (37 C) 80  20  100 %  12/15/11 2257 126/49 mmHg 98.5 F (36.9 C) 88  20  100 %  12/15/11 2000 - - - 18  94 %  12/15/11 1600 - - - 20  94 %    @flow {1959:LAST@   Intake/Output from previous day:   07/16 0701 - 07/17 0700 In: 720 [P.O.:720] Out: 651 [Urine:351; Drains:300]   Intake/Output this shift:       Intake/Output      07/16 0701 - 07/17 0700 07/17 0701 - 07/18 0700   P.O. 720    I.V. (mL/kg)     Total Intake(mL/kg) 720 (8.2)    Urine (mL/kg/hr) 351 (0.2)    Drains 300    Blood     Total Output 651    Net +69         Urine Occurrence 1 x       LABORATORY DATA:  Basename 12/16/11 0615 12/15/11 0600  WBC 6.1 7.4  HGB 10.9* 11.0*  HCT 31.5* 32.4*  PLT 162 160    Basename 12/16/11 0615 12/15/11 0600  NA 136 137  K 3.7 3.8  CL 100 103  CO2 26 26  BUN 15 22  CREATININE 1.33 1.51*  GLUCOSE 117* 127*  CALCIUM 7.4* 7.5*   Lab Results  Component Value Date   INR 0.99 12/07/2011   INR 1.01 11/09/2011   INR 1.2 02/06/2007    Examination:  General appearance: alert, cooperative and no distress Extremities: extremities normal, atraumatic, no cyanosis or edema and Homans sign is negative, no sign of DVT  Wound Exam: clean, dry, intact   Drainage:  None: wound tissue dry  Motor Exam: EHL and FHL Intact  Sensory Exam: Deep Peroneal normal  Vascular Exam:    Assessment:    2 Days Post-Op   Procedure(s) (LRB): TOTAL KNEE ARTHROPLASTY (Left)  ADDITIONAL DIAGNOSIS:  Active Problems:  * No active hospital problems. *   Acute Blood Loss Anemia   Plan: Physical Therapy as ordered Weight Bearing as Tolerated (WBAT)  DVT Prophylaxis:  Lovenox  DISCHARGE PLAN: Home  DISCHARGE NEEDS: HHPT, CPM, Walker and 3-in-1 comode seat         Christyanna Mckeon 12/16/2011, 1:47 PM

## 2011-12-16 NOTE — Progress Notes (Signed)
PT progress NOTE   12/16/11 1200  PT Visit Information  Last PT Received On 12/16/11  Assistance Needed +1  PT Time Calculation  PT Start Time 1117  PT Stop Time 1145  PT Time Calculation (min) 28 min  Subjective Data  Subjective "It just hurts sitting  Precautions  Precautions Knee  Restrictions  Weight Bearing Restrictions Yes  LLE Weight Bearing WBAT  Cognition  Overall Cognitive Status Appears within functional limits for tasks assessed/performed  Arousal/Alertness Awake/alert  Orientation Level Appears intact for tasks assessed  Behavior During Session [facility] for tasks performed  Bed Mobility  Bed Mobility Supine to Sit;Sit to Supine  Supine to Sit 4: Min assist;With rails;HOB elevated  Details for Bed Mobility Assistance (A) with left LE OOB with cues for proper technique pt continues to use rail to assist OOB  Transfers  Transfers Sit to Stand;Stand to Sit  Sit to Stand 4: Min guard;From bed  Stand to Sit 4: Min guard;To chair/3-in-1  Details for Transfer Assistance Minguard for safety with cues for hand placement and RW placement.  Pt tends to sit down without RW in proper placement  Ambulation/Gait  Ambulation/Gait Assistance 5: Supervision  Ambulation Distance (Feet) 40 Feet  Assistive device Rolling walker  Ambulation/Gait Assistance Details Minguard for safety with cues for proper step sequence and encourage to step through gait. Pt continues to ambulate with antalgic gait with decrease knee extension  Gait Pattern Step-to pattern;Decreased stride length;Shuffle;Antalgic  Stairs Yes  Stairs Assistance 4: Min assist  Stairs Assistance Details (indicate cue type and reason) (A) with RW management and stabilize RW during stair negotiation.  Max cues for step sequence with present and able to assist pt.  Stair Management Technique Backwards;With walker  Number of Stairs 3  (x2)  PT - End of Session  Equipment Utilized During Treatment Gait belt  Activity Tolerance  Patient tolerated treatment well  Patient left in chair;with call bell/phone within reach;with family/visitor present  Nurse Communication Mobility status  PT - Assessment/Plan  Comments on Treatment Session Pt able to perform stair negotiation with cues for proper technique.  Pt continues to be limited due to pain with antaglic gait however met most goals.  Pt safe to d/c home with family assistance.  PT Plan Discharge plan remains appropriate;Frequency remains appropriate  PT Frequency 7X/week  Follow Up Recommendations Home health PT  Equipment Recommended None recommended by PT  Acute Rehab PT Goals  PT Goal Formulation With patient  Time For Goal Achievement 12/22/11  Potential to Achieve Goals Good  Pt will go Supine/Side to Sit with modified independence  PT Goal: Supine/Side to Sit - Progress Progressing toward goal  Pt will go Sit to Stand with modified independence  PT Goal: Sit to Stand - Progress Progressing toward goal  Pt will go Stand to Sit with modified independence  PT Goal: Stand to Sit - Progress Progressing toward goal  Pt will Ambulate >150 feet;with modified independence;with rolling walker  PT Goal: Ambulate - Progress Progressing toward goal  Pt will Go Up / Down Stairs with min assist;with least restrictive assistive device;3-5 stairs  PT Goal: Up/Down Stairs - Progress Met  PT General Charges  $$ ACUTE PT VISIT 1 Procedure  PT Treatments  $Gait Training 23-37 mins    [place], [place] DPT [phone]

## 2011-12-16 NOTE — Progress Notes (Signed)
Physical Therapy Treatment Patient Details Name: Roberto Reid MRN: [id number] DOB: Nov 24, 1932 Today's Date: 12/16/2011 Time: [id number] PT Time Calculation (min): 31 min  PT Assessment / Plan / Recommendation Comments on Treatment Session  Pt able to increase ambulation distance with decrease pain.  Pt continues to be limited with knee extesion with 5 to 50 degrees knee flexion AAROM.  Pt will need to perform stair negotiation next session.      Follow Up Recommendations  Home health PT    Barriers to Discharge        Equipment Recommendations  None recommended by PT    Recommendations for Other Services    Frequency 7X/week   Plan Discharge plan remains appropriate;Frequency remains appropriate    Precautions / Restrictions Precautions Precautions: Knee Restrictions Weight Bearing Restrictions: Yes LLE Weight Bearing: Weight bearing as tolerated   Pertinent Vitals/Pain 4/10 left knee pain    Mobility  Bed Mobility Bed Mobility: Supine to Sit;Sit to Supine Supine to Sit: 4: Min assist;With rails;HOB elevated Details for Bed Mobility Assistance: (A) with left LE OOB with cues for proper technique pt continues to use rail to assist OOB Transfers Transfers: Sit to Stand;Stand to Sit Sit to Stand: 4: Min guard;From bed Stand to Sit: 4: Min guard;To chair/3-in-1 Details for Transfer Assistance: Minguard for safety with cues for hand placement and RW placement.  Pt tends to sit down without RW in proper placement Ambulation/Gait Ambulation/Gait Assistance: 4: Min guard Ambulation Distance (Feet): 60 Feet Assistive device: Rolling walker Ambulation/Gait Assistance Details: Minguard for safety with cues for proper step sequence and encourage to step through gait.  Pt continues to ambulate with antalgic gait with decrease knee extension Gait Pattern: Step-to pattern;Decreased stride length;Shuffle;Antalgic    Exercises Total Joint Exercises Ankle Circles/Pumps: AAROM;Both;10  reps Quad Sets: AAROM;Strengthening;Left;10 reps Short Arc Quad: Strengthening;Left;5 reps Heel Slides: AAROM;Left;10 reps Hip ABduction/ADduction: Left;10 reps;Strengthening Straight Leg Raises: Strengthening;5 reps;Left   PT Diagnosis:    PT Problem List:   PT Treatment Interventions:     PT Goals Acute Rehab PT Goals PT Goal Formulation: With patient Time For Goal Achievement: 12/22/11 Potential to Achieve Goals: Good Pt will go Supine/Side to Sit: with modified independence PT Goal: Supine/Side to Sit - Progress: Progressing toward goal Pt will go Sit to Stand: with modified independence PT Goal: Sit to Stand - Progress: Progressing toward goal Pt will go Stand to Sit: with modified independence PT Goal: Stand to Sit - Progress: Progressing toward goal Pt will Ambulate: >150 feet;with modified independence;with rolling walker PT Goal: Ambulate - Progress: Progressing toward goal  Visit Information  Last PT Received On: 12/16/11 Assistance Needed: +1    Subjective Data  Subjective: "I'm doing better today.  Not sure I can sit in the chair for long.  It hurts my leg."   Cognition  Overall Cognitive Status: Appears within functional limits for tasks assessed/performed Arousal/Alertness: Awake/alert Orientation Level: Appears intact for tasks assessed Behavior During Session: [place] [facility] for tasks performed    Balance     End of Session PT - End of Session Equipment Utilized During Treatment: Gait belt Activity Tolerance: Patient tolerated treatment well Patient left: in chair;with call bell/phone within reach;with family/visitor present Nurse Communication: Mobility status CPM Left Knee CPM Left Knee: On CPM Right Knee CPM Right Knee: On Right Knee Flexion (Degrees): 90  Right Knee Extension (Degrees): 0    GP     Leela Vanbrocklin 12/16/2011, 9:49 AM Antoine Poche, PT DPT [phone]

## 2012-01-05 ENCOUNTER — Ambulatory Visit: Payer: Medicare Other | Attending: Orthopedic Surgery | Admitting: Physical Therapy

## 2012-01-05 DIAGNOSIS — M25569 Pain in unspecified knee: Secondary | ICD-10-CM | POA: Insufficient documentation

## 2012-01-05 DIAGNOSIS — IMO0001 Reserved for inherently not codable concepts without codable children: Secondary | ICD-10-CM | POA: Insufficient documentation

## 2012-01-05 DIAGNOSIS — Z96659 Presence of unspecified artificial knee joint: Secondary | ICD-10-CM | POA: Insufficient documentation

## 2012-01-05 DIAGNOSIS — M25669 Stiffness of unspecified knee, not elsewhere classified: Secondary | ICD-10-CM | POA: Insufficient documentation

## 2012-01-06 ENCOUNTER — Ambulatory Visit: Payer: Medicare Other | Admitting: Physical Therapy

## 2012-01-11 ENCOUNTER — Ambulatory Visit: Payer: Medicare Other | Admitting: Physical Therapy

## 2012-01-11 ENCOUNTER — Other Ambulatory Visit: Payer: Self-pay

## 2012-01-11 MED ORDER — ESOMEPRAZOLE MAGNESIUM 40 MG PO CPDR: 40.0000 mg | DELAYED_RELEASE_CAPSULE | Freq: Two times a day (BID) | ORAL | Status: DC

## 2012-01-14 ENCOUNTER — Ambulatory Visit: Payer: Medicare Other | Admitting: Physical Therapy

## 2012-01-18 ENCOUNTER — Ambulatory Visit: Payer: Medicare Other | Admitting: Physical Therapy

## 2012-01-18 ENCOUNTER — Other Ambulatory Visit: Payer: Self-pay

## 2012-01-18 MED ORDER — SILDENAFIL CITRATE 100 MG PO TABS: 100.0000 mg | ORAL_TABLET | Freq: Every day | ORAL | Status: DC | PRN

## 2012-01-18 MED ORDER — TESTOSTERONE 12.5 MG/ACT (1%) TD GEL: 3.0000 | Freq: Every day | TRANSDERMAL | Status: DC

## 2012-01-19 ENCOUNTER — Other Ambulatory Visit: Payer: Self-pay

## 2012-01-19 MED ORDER — ZOLPIDEM TARTRATE 10 MG PO TABS: 10.0000 mg | ORAL_TABLET | Freq: Every evening | ORAL | Status: DC | PRN

## 2012-01-20 ENCOUNTER — Ambulatory Visit: Payer: Medicare Other | Admitting: Physical Therapy

## 2012-01-25 ENCOUNTER — Ambulatory Visit: Payer: Medicare Other | Admitting: Physical Therapy

## 2012-01-26 ENCOUNTER — Ambulatory Visit: Payer: Medicare Other | Admitting: Physical Therapy

## 2012-02-02 ENCOUNTER — Ambulatory Visit: Payer: Medicare Other | Admitting: Physical Therapy

## 2012-02-02 ENCOUNTER — Other Ambulatory Visit: Payer: Self-pay

## 2012-02-02 MED ORDER — HYDROCODONE-ACETAMINOPHEN 10-650 MG PO TABS: 1.0000 | ORAL_TABLET | Freq: Four times a day (QID) | ORAL | Status: DC | PRN

## 2012-02-04 ENCOUNTER — Ambulatory Visit: Payer: Medicare Other | Admitting: Physical Therapy

## 2012-02-08 ENCOUNTER — Other Ambulatory Visit: Payer: Self-pay

## 2012-02-08 MED ORDER — HYDROCODONE-HOMATROPINE 5-1.5 MG/5ML PO SYRP: 5.0000 mL | ORAL_SOLUTION | Freq: Four times a day (QID) | ORAL | Status: AC | PRN

## 2012-02-09 ENCOUNTER — Ambulatory Visit: Payer: Medicare Other | Attending: Orthopedic Surgery | Admitting: Physical Therapy

## 2012-02-09 DIAGNOSIS — IMO0001 Reserved for inherently not codable concepts without codable children: Secondary | ICD-10-CM | POA: Insufficient documentation

## 2012-02-09 DIAGNOSIS — M25569 Pain in unspecified knee: Secondary | ICD-10-CM | POA: Insufficient documentation

## 2012-02-09 DIAGNOSIS — M25669 Stiffness of unspecified knee, not elsewhere classified: Secondary | ICD-10-CM | POA: Insufficient documentation

## 2012-02-09 DIAGNOSIS — Z96659 Presence of unspecified artificial knee joint: Secondary | ICD-10-CM | POA: Insufficient documentation

## 2012-02-11 ENCOUNTER — Ambulatory Visit: Payer: Medicare Other | Admitting: Physical Therapy

## 2012-02-26 ENCOUNTER — Other Ambulatory Visit: Payer: Self-pay

## 2012-02-26 MED ORDER — ESOMEPRAZOLE MAGNESIUM 40 MG PO CPDR: 40.0000 mg | DELAYED_RELEASE_CAPSULE | Freq: Two times a day (BID) | ORAL | Status: AC

## 2012-02-27 ENCOUNTER — Other Ambulatory Visit: Payer: Self-pay | Admitting: Internal Medicine

## 2012-02-29 ENCOUNTER — Other Ambulatory Visit: Payer: Self-pay

## 2012-02-29 MED ORDER — LOSARTAN POTASSIUM 100 MG PO TABS: 100.0000 mg | ORAL_TABLET | Freq: Every day | ORAL | Status: DC

## 2012-03-10 ENCOUNTER — Telehealth: Payer: Self-pay | Admitting: Internal Medicine

## 2012-03-10 NOTE — Telephone Encounter (Signed)
Please refill as requested.

## 2012-03-11 NOTE — Telephone Encounter (Signed)
[facility] pharmacy contacted and approval given for vacation overide refill of Ambien 10mg 

## 2012-03-25 ENCOUNTER — Other Ambulatory Visit: Payer: Self-pay | Admitting: Internal Medicine

## 2012-03-25 NOTE — Telephone Encounter (Signed)
Please refill for 2 months.

## 2012-03-30 ENCOUNTER — Other Ambulatory Visit: Payer: Self-pay | Admitting: Internal Medicine

## 2012-04-25 ENCOUNTER — Other Ambulatory Visit: Payer: Self-pay | Admitting: Internal Medicine

## 2012-04-25 ENCOUNTER — Other Ambulatory Visit: Payer: Self-pay

## 2012-04-25 MED ORDER — ATORVASTATIN CALCIUM 80 MG PO TABS: 80.0000 mg | ORAL_TABLET | Freq: Every day | ORAL | Status: DC

## 2012-05-13 ENCOUNTER — Telehealth: Payer: Self-pay | Admitting: Internal Medicine

## 2012-05-13 DIAGNOSIS — J069 Acute upper respiratory infection, unspecified: Secondary | ICD-10-CM

## 2012-05-13 NOTE — Telephone Encounter (Signed)
Patient calls today with chest congestion and cough. No fever, chills or discolored drainage. Requests rx cough syrup. Ok per Dr. Renold Genta for Hycodan syrup, as dir prescription for sent to [facility] [address]

## 2012-05-19 ENCOUNTER — Other Ambulatory Visit: Payer: Self-pay | Admitting: Internal Medicine

## 2012-05-19 NOTE — Telephone Encounter (Signed)
We just called in 8 oz over the weekend? Does he need to come in?

## 2012-05-20 ENCOUNTER — Other Ambulatory Visit: Payer: Self-pay

## 2012-06-03 DIAGNOSIS — J40 Bronchitis, not specified as acute or chronic: Secondary | ICD-10-CM

## 2012-06-21 ENCOUNTER — Other Ambulatory Visit: Payer: Self-pay

## 2012-06-21 ENCOUNTER — Other Ambulatory Visit: Payer: Self-pay | Admitting: Internal Medicine

## 2012-06-21 MED ORDER — HYDROCODONE-ACETAMINOPHEN 10-325 MG PO TABS: 1.0000 | ORAL_TABLET | Freq: Three times a day (TID) | ORAL | Status: DC | PRN

## 2012-07-19 ENCOUNTER — Other Ambulatory Visit: Payer: Self-pay | Admitting: Internal Medicine

## 2012-07-20 ENCOUNTER — Other Ambulatory Visit: Payer: Self-pay

## 2012-07-20 MED ORDER — ZOLPIDEM TARTRATE 10 MG PO TABS: 10.0000 mg | ORAL_TABLET | Freq: Every evening | ORAL | Status: DC | PRN

## 2012-07-24 ENCOUNTER — Other Ambulatory Visit: Payer: Self-pay | Admitting: Internal Medicine

## 2012-07-25 ENCOUNTER — Other Ambulatory Visit: Payer: Self-pay

## 2012-07-25 MED ORDER — TESTOSTERONE 12.5 MG/ACT (1%) TD GEL: 3.0000 | Freq: Every day | TRANSDERMAL | Status: DC

## 2012-07-25 NOTE — Telephone Encounter (Signed)
Please refill for one year  

## 2012-07-26 DIAGNOSIS — Z96659 Presence of unspecified artificial knee joint: Secondary | ICD-10-CM | POA: Insufficient documentation

## 2012-08-04 ENCOUNTER — Other Ambulatory Visit: Payer: Self-pay | Admitting: Internal Medicine

## 2012-08-08 ENCOUNTER — Other Ambulatory Visit: Payer: Self-pay

## 2012-09-08 ENCOUNTER — Other Ambulatory Visit: Payer: Self-pay | Admitting: Internal Medicine

## 2012-09-08 ENCOUNTER — Other Ambulatory Visit: Payer: Self-pay

## 2012-09-08 NOTE — Telephone Encounter (Signed)
Give #100 with no refill

## 2012-09-14 ENCOUNTER — Ambulatory Visit (INDEPENDENT_AMBULATORY_CARE_PROVIDER_SITE_OTHER): Payer: Medicare Other | Admitting: Internal Medicine

## 2012-09-14 ENCOUNTER — Encounter: Payer: Self-pay | Admitting: Internal Medicine

## 2012-09-14 VITALS — BP 128/74 | HR 76 | Temp 97.7°F

## 2012-09-14 DIAGNOSIS — E119 Type 2 diabetes mellitus without complications: Secondary | ICD-10-CM

## 2012-09-14 DIAGNOSIS — L089 Local infection of the skin and subcutaneous tissue, unspecified: Secondary | ICD-10-CM

## 2012-09-14 MED ORDER — CEFTRIAXONE SODIUM 1 G IJ SOLR
1.0000 g | Freq: Once | INTRAMUSCULAR | Status: AC
  Administered 2012-09-14: 1 g via INTRAMUSCULAR

## 2012-09-14 NOTE — Progress Notes (Signed)
  Subjective:    Patient ID: Roberto Reid, male    DOB: 30-Jul-1932, 79 y.o.   MRN: [id number]  HPI Developed swelling and pain right fourth toe recently after [place]. Started himself on Clindamycin but it is causing GI side effects. Hx of diet controlled DM.    Review of Systems     Objective:   Physical Exam swollen right fourth toe tender to palpation. Calluses on feet that are not infected. No drainage.        Assessment & Plan:  Infected right fourth toe  Plan:Rocephin one gram IM. Levaquin 750 mg daily x 2 weeks. Return in one week. Consider X-ray next OV.

## 2012-09-14 NOTE — Patient Instructions (Addendum)
Take Levaquin 750 mg daily x 2 weeks. Return in one week. Given one gram IM Rocephin in office today.

## 2012-09-19 DIAGNOSIS — E291 Testicular hypofunction: Secondary | ICD-10-CM

## 2012-09-26 ENCOUNTER — Encounter: Payer: Self-pay | Admitting: Internal Medicine

## 2012-09-26 ENCOUNTER — Ambulatory Visit (INDEPENDENT_AMBULATORY_CARE_PROVIDER_SITE_OTHER): Payer: Medicare Other | Admitting: Internal Medicine

## 2012-09-26 VITALS — BP 124/78 | Temp 98.6°F | Wt 203.0 lb

## 2012-09-26 DIAGNOSIS — M2060 Acquired deformities of toe(s), unspecified, unspecified foot: Secondary | ICD-10-CM

## 2012-09-26 DIAGNOSIS — M21619 Bunion of unspecified foot: Secondary | ICD-10-CM

## 2012-09-26 DIAGNOSIS — M21611 Bunion of right foot: Secondary | ICD-10-CM

## 2012-09-26 DIAGNOSIS — M2061 Acquired deformities of toe(s), unspecified, right foot: Secondary | ICD-10-CM

## 2012-09-26 NOTE — Patient Instructions (Addendum)
Recommend consultation with foot specialist.

## 2012-09-26 NOTE — Progress Notes (Signed)
  Subjective:    Patient ID: Roberto Reid, male    DOB: [birth year], 79 y.o.   MRN: [id number]  HPI In for followup today of infected right second toe. At last visit was treated with Levaquin and has improved. Still has some pain distal toe but no evidence of drainage or pus. Spoke with him today about  deformity of the great toe and bunion. He is previously had surgery on his foot several years ago. Says there is a broken pin in right great toe. Says do to cellulitis in his right lower extremity several years ago presumably from a spider bite, foot no longer heals well do to some venous insufficiency and likely disruption of effective lymphatic drainage. He's entertained the idea of seeing a foot specialist in [place]. Told him I was concerned he could possibly have osteomyelitis in second toe. Declines x-ray today.    Review of Systems     Objective:   Physical Exam he has callus formation distal aspect of right second toe. The right second toe toe tends to curl downward. He has deformity of right great toe pointing medially towards second toe and bunion.        Assessment & Plan:  Deformity right great toe  Bunion  Deformity right second toe with callus formation status post recent distal infection which has healed with antibiotic therapy  Plan: Strongly advise him to see foot specialist for further evaluation.

## 2012-10-04 ENCOUNTER — Other Ambulatory Visit: Payer: Self-pay | Admitting: Internal Medicine

## 2012-10-04 ENCOUNTER — Other Ambulatory Visit: Payer: Self-pay

## 2012-10-04 MED ORDER — HYDROCODONE-ACETAMINOPHEN 10-325 MG PO TABS: 1.0000 | ORAL_TABLET | Freq: Two times a day (BID) | ORAL | Status: DC | PRN

## 2012-10-04 NOTE — Telephone Encounter (Signed)
Give #60 with no refill. 

## 2012-10-06 ENCOUNTER — Other Ambulatory Visit: Payer: Self-pay

## 2012-11-04 ENCOUNTER — Other Ambulatory Visit: Payer: Self-pay

## 2012-11-04 ENCOUNTER — Other Ambulatory Visit: Payer: Self-pay | Admitting: Internal Medicine

## 2012-11-04 NOTE — Telephone Encounter (Signed)
Please take care of thid refill #90 one po q 8 hours prn pain

## 2012-11-08 ENCOUNTER — Telehealth: Payer: Self-pay | Admitting: Internal Medicine

## 2012-11-08 NOTE — Telephone Encounter (Signed)
Erroneous entry...was a personal call to Dr. Renold Genta regarding business, not medical question.

## 2012-11-25 ENCOUNTER — Ambulatory Visit (INDEPENDENT_AMBULATORY_CARE_PROVIDER_SITE_OTHER): Payer: Medicare Other | Admitting: Internal Medicine

## 2012-11-25 ENCOUNTER — Encounter: Payer: Self-pay | Admitting: Internal Medicine

## 2012-11-25 DIAGNOSIS — L209 Atopic dermatitis, unspecified: Secondary | ICD-10-CM

## 2012-11-25 DIAGNOSIS — L309 Dermatitis, unspecified: Secondary | ICD-10-CM

## 2012-11-25 DIAGNOSIS — L259 Unspecified contact dermatitis, unspecified cause: Secondary | ICD-10-CM

## 2012-11-25 DIAGNOSIS — L2089 Other atopic dermatitis: Secondary | ICD-10-CM

## 2012-11-25 MED ORDER — METHYLPREDNISOLONE ACETATE 80 MG/ML IJ SUSP
80.0000 mg | Freq: Once | INTRAMUSCULAR | Status: AC
  Administered 2012-11-25: 80 mg via INTRAMUSCULAR

## 2012-11-25 NOTE — Patient Instructions (Addendum)
You have been given Depo-Medrol 80 mg IM today. Followup the dermatologist.

## 2012-11-25 NOTE — Progress Notes (Signed)
  Subjective:    Patient ID: Roberto Reid, male    DOB: 1932-09-06, 79 y.o.   MRN: [id number]  HPI  History of atopic dermatitis for which Detmatologist usually gives him Kenalog 40 mg IM injections. However, he cannot get appointment to see dermatologist for several weeks. He uses betamethasone 0.05% topical ointment topically. He says injections usually clear him up for about 3 months.  History of squamous cell carcinoma left hand 2012. History of basal cell carcinoma left lower leg May 2005.    Review of Systems     Objective:   Physical Exam irritated scaly patches and plaques both the        Assessment & Plan:  Atopic dermatitis  Plan: Depo-Medrol 80 mg IM which should temporize until he can see dermatologist. Continue betamethasone ointment twice a day

## 2012-12-08 ENCOUNTER — Other Ambulatory Visit: Payer: Self-pay | Admitting: Internal Medicine

## 2012-12-08 NOTE — Telephone Encounter (Signed)
Refill once 

## 2013-01-15 ENCOUNTER — Other Ambulatory Visit: Payer: Self-pay | Admitting: Internal Medicine

## 2013-01-15 NOTE — Telephone Encounter (Signed)
Please refill x 6 months 

## 2013-01-16 ENCOUNTER — Other Ambulatory Visit: Payer: Self-pay

## 2013-01-16 MED ORDER — ZOLPIDEM TARTRATE 10 MG PO TABS: 10.0000 mg | ORAL_TABLET | Freq: Every evening | ORAL | Status: DC | PRN

## 2013-01-18 ENCOUNTER — Other Ambulatory Visit: Payer: Self-pay

## 2013-01-18 MED ORDER — HYDROCODONE-ACETAMINOPHEN 10-325 MG PO TABS: 1.0000 | ORAL_TABLET | Freq: Three times a day (TID) | ORAL | Status: DC | PRN

## 2013-01-23 ENCOUNTER — Other Ambulatory Visit: Payer: Self-pay | Admitting: Internal Medicine

## 2013-01-23 NOTE — Telephone Encounter (Signed)
I thought this had been refilled and prior auth obtained

## 2013-02-16 ENCOUNTER — Other Ambulatory Visit: Payer: Self-pay

## 2013-02-27 ENCOUNTER — Other Ambulatory Visit: Payer: Self-pay

## 2013-02-27 MED ORDER — HYDROCODONE-ACETAMINOPHEN 10-325 MG PO TABS: 1.0000 | ORAL_TABLET | Freq: Two times a day (BID) | ORAL | Status: DC | PRN

## 2013-03-28 ENCOUNTER — Telehealth: Payer: Self-pay | Admitting: Internal Medicine

## 2013-03-28 ENCOUNTER — Other Ambulatory Visit: Payer: Self-pay | Admitting: *Deleted

## 2013-03-28 MED ORDER — HYDROCODONE-ACETAMINOPHEN 10-325 MG PO TABS: 1.0000 | ORAL_TABLET | Freq: Two times a day (BID) | ORAL | Status: DC | PRN

## 2013-03-28 NOTE — Telephone Encounter (Signed)
Print up # separate Rxs 10/325 #100 each dated 30 days apart

## 2013-03-28 NOTE — Telephone Encounter (Signed)
[employer].

## 2013-04-30 ENCOUNTER — Other Ambulatory Visit: Payer: Self-pay | Admitting: Internal Medicine

## 2013-05-08 ENCOUNTER — Other Ambulatory Visit: Payer: Self-pay | Admitting: Internal Medicine

## 2013-05-30 ENCOUNTER — Other Ambulatory Visit: Payer: Self-pay

## 2013-05-30 MED ORDER — AMLODIPINE BESYLATE 5 MG PO TABS: 5.0000 mg | ORAL_TABLET | Freq: Every day | ORAL | Status: DC

## 2013-05-30 MED ORDER — ATORVASTATIN CALCIUM 80 MG PO TABS: 80.0000 mg | ORAL_TABLET | Freq: Every day | ORAL | Status: DC

## 2013-06-15 ENCOUNTER — Other Ambulatory Visit: Payer: Self-pay

## 2013-07-11 ENCOUNTER — Other Ambulatory Visit: Payer: Self-pay | Admitting: Internal Medicine

## 2013-07-11 ENCOUNTER — Other Ambulatory Visit: Payer: Self-pay

## 2013-07-11 NOTE — Telephone Encounter (Signed)
Please refill x 6 months 

## 2013-07-25 ENCOUNTER — Telehealth: Payer: Self-pay

## 2013-08-07 ENCOUNTER — Telehealth: Payer: Self-pay | Admitting: Internal Medicine

## 2013-08-07 NOTE — Telephone Encounter (Signed)
Please refill.

## 2013-08-08 ENCOUNTER — Other Ambulatory Visit: Payer: Self-pay

## 2013-08-08 MED ORDER — HYDROCODONE-ACETAMINOPHEN 10-325 MG PO TABS: 1.0000 | ORAL_TABLET | Freq: Two times a day (BID) | ORAL | Status: DC | PRN

## 2013-08-25 ENCOUNTER — Other Ambulatory Visit: Payer: Self-pay | Admitting: Internal Medicine

## 2013-08-25 MED ORDER — TESTOSTERONE 12.5 MG/ACT (1%) TD GEL: 3.0000 | Freq: Every day | TRANSDERMAL | Status: DC

## 2013-08-25 NOTE — Telephone Encounter (Signed)
Please call pharmacy. Pt wants to take 5- 20 mg generic tabs  instead of 100 mg Viagra. I doubt it will be as effective but i guess he can try it.

## 2013-08-25 NOTE — Telephone Encounter (Signed)
I thought we had this refillable for one year.

## 2013-10-02 ENCOUNTER — Other Ambulatory Visit: Payer: Self-pay

## 2013-10-02 MED ORDER — ATORVASTATIN CALCIUM 80 MG PO TABS: 80.0000 mg | ORAL_TABLET | Freq: Every day | ORAL | Status: DC

## 2013-10-19 ENCOUNTER — Other Ambulatory Visit: Payer: Self-pay | Admitting: Internal Medicine

## 2013-10-30 ENCOUNTER — Telehealth: Payer: Self-pay | Admitting: Internal Medicine

## 2013-10-30 ENCOUNTER — Other Ambulatory Visit: Payer: Self-pay

## 2013-10-30 MED ORDER — HYDROCODONE-ACETAMINOPHEN 10-325 MG PO TABS: 1.0000 | ORAL_TABLET | Freq: Two times a day (BID) | ORAL | Status: DC | PRN

## 2013-10-30 NOTE — Telephone Encounter (Signed)
Please refill for 90 days

## 2013-11-27 ENCOUNTER — Other Ambulatory Visit: Payer: Self-pay

## 2013-11-27 ENCOUNTER — Ambulatory Visit (INDEPENDENT_AMBULATORY_CARE_PROVIDER_SITE_OTHER): Payer: Medicare Other | Admitting: Internal Medicine

## 2013-11-27 ENCOUNTER — Encounter: Payer: Self-pay | Admitting: Internal Medicine

## 2013-11-27 VITALS — BP 124/72 | HR 80 | Temp 97.9°F | Wt 201.0 lb

## 2013-11-27 DIAGNOSIS — S8010XA Contusion of unspecified lower leg, initial encounter: Secondary | ICD-10-CM

## 2013-11-27 DIAGNOSIS — S8011XA Contusion of right lower leg, initial encounter: Secondary | ICD-10-CM

## 2013-11-27 MED ORDER — LEVOFLOXACIN 500 MG PO TABS: 500.0000 mg | ORAL_TABLET | Freq: Every day | ORAL | Status: DC

## 2013-11-27 MED ORDER — ZOLPIDEM TARTRATE 10 MG PO TABS: 10.0000 mg | ORAL_TABLET | Freq: Every evening | ORAL | Status: DC | PRN
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
## 2013-11-27 NOTE — Patient Instructions (Signed)
Keep appointment with orthopedist to evaluate for possible incision and drainage. Keep leg elevated and keep ice applied. Take Levaquin 500 milligrams daily for 10 days

## 2013-12-12 ENCOUNTER — Ambulatory Visit (INDEPENDENT_AMBULATORY_CARE_PROVIDER_SITE_OTHER): Payer: Medicare Other | Admitting: Internal Medicine

## 2013-12-12 ENCOUNTER — Encounter: Payer: Self-pay | Admitting: Internal Medicine

## 2013-12-12 VITALS — BP 118/66 | Temp 99.0°F | Wt 196.0 lb

## 2013-12-12 DIAGNOSIS — L02419 Cutaneous abscess of limb, unspecified: Secondary | ICD-10-CM

## 2013-12-12 DIAGNOSIS — L03119 Cellulitis of unspecified part of limb: Secondary | ICD-10-CM

## 2013-12-12 DIAGNOSIS — S8011XA Contusion of right lower leg, initial encounter: Secondary | ICD-10-CM

## 2013-12-12 DIAGNOSIS — L03116 Cellulitis of left lower limb: Secondary | ICD-10-CM

## 2013-12-12 MED ORDER — LEVOFLOXACIN 500 MG PO TABS: 500.0000 mg | ORAL_TABLET | Freq: Every day | ORAL | Status: DC

## 2013-12-12 MED ORDER — CEFTRIAXONE SODIUM 1 G IJ SOLR
1.0000 g | Freq: Once | INTRAMUSCULAR | Status: AC
  Administered 2013-12-12: 1 g via INTRAMUSCULAR

## 2013-12-12 NOTE — Patient Instructions (Addendum)
Levaquin 500 milligrams daily for an additional 10 days. Given Rocephin 1 g IM.

## 2014-01-23 ENCOUNTER — Other Ambulatory Visit: Payer: Self-pay

## 2014-01-23 MED ORDER — HYDROCODONE-ACETAMINOPHEN 10-325 MG PO TABS: 1.0000 | ORAL_TABLET | Freq: Two times a day (BID) | ORAL | Status: DC | PRN

## 2014-01-24 ENCOUNTER — Other Ambulatory Visit: Payer: Self-pay

## 2014-01-24 MED ORDER — TESTOSTERONE 12.5 MG/ACT (1%) TD GEL: 3.0000 | Freq: Every day | TRANSDERMAL | Status: DC

## 2014-02-17 ENCOUNTER — Encounter: Payer: Self-pay | Admitting: Internal Medicine

## 2014-02-17 NOTE — Progress Notes (Signed)
   Subjective:    Patient ID: Roberto Reid, male    DOB: Apr 04, 1933, 80 y.o.   MRN: [id number]  HPI  Was in June 29 with acute traumatic hematoma right lower leg. Was referred to orthopedist to incise and drain the hematoma. It is healing up fairly well but patient has noticed some surrounding redness and tenderness recently and he is getting ready to go out-of-town .  He is concerned about recurrent infection.    Review of Systems     Objective:   Physical Exam  Mild erythema about healing area of hematoma. Tender to touch.      Assessment & Plan:  Resolving traumatic hematoma with possible early bacterial infection  Plan: Rocephin 1 g IM. Levaquin 500 milligrams daily for an additional 10 days.

## 2014-02-23 ENCOUNTER — Telehealth: Payer: Self-pay

## 2014-02-23 MED ORDER — LEVOFLOXACIN 500 MG PO TABS: 500.0000 mg | ORAL_TABLET | Freq: Every day | ORAL | Status: DC

## 2014-02-23 NOTE — Telephone Encounter (Signed)
Patients wife called regarding patient.  She would like an antibiotic for him.  Patient had fever and chills last night.  They are out of town and would like something called into [facility] [phone].  Advised her she should take him to urgent care but she wanted to know what provider wanted her to do.

## 2014-02-23 NOTE — Telephone Encounter (Signed)
Per Daleen Snook patients leg does not feel warmer than normal.  Advised her levaquin was sent to pharmacy.  Dr Renold Genta aware.

## 2014-03-08 ENCOUNTER — Encounter: Payer: Self-pay | Admitting: Internal Medicine

## 2014-03-08 ENCOUNTER — Ambulatory Visit (INDEPENDENT_AMBULATORY_CARE_PROVIDER_SITE_OTHER): Payer: Medicare Other | Admitting: Internal Medicine

## 2014-03-08 VITALS — BP 122/80 | HR 75 | Temp 97.2°F | Ht 70.5 in | Wt 193.0 lb

## 2014-03-08 DIAGNOSIS — Z23 Encounter for immunization: Secondary | ICD-10-CM

## 2014-03-08 DIAGNOSIS — L03115 Cellulitis of right lower limb: Secondary | ICD-10-CM

## 2014-03-08 LAB — CBC WITH DIFFERENTIAL/PLATELET
BASOS ABS: 0.1 10*3/uL (ref 0.0–0.1)
Basophils Relative: 1 % (ref 0–1)
Eosinophils Absolute: 0.1 10*3/uL (ref 0.0–0.7)
Eosinophils Relative: 1 % (ref 0–5)
HCT: 40.3 % (ref 39.0–52.0)
Hemoglobin: 14.1 g/dL (ref 13.0–17.0)
Lymphocytes Relative: 21 % (ref 12–46)
Lymphs Abs: 1.6 10*3/uL (ref 0.7–4.0)
MCH: 32.6 pg (ref 26.0–34.0)
MCHC: 35 g/dL (ref 30.0–36.0)
MCV: 93.3 fL (ref 78.0–100.0)
MONOS PCT: 7 % (ref 3–12)
Monocytes Absolute: 0.5 10*3/uL (ref 0.1–1.0)
Neutro Abs: 5.4 10*3/uL (ref 1.7–7.7)
Neutrophils Relative %: 70 % (ref 43–77)
Platelets: 278 10*3/uL (ref 150–400)
RBC: 4.32 MIL/uL (ref 4.22–5.81)
RDW: 13.8 % (ref 11.5–15.5)
WBC: 7.7 10*3/uL (ref 4.0–10.5)

## 2014-03-09 ENCOUNTER — Telehealth: Payer: Self-pay

## 2014-03-09 NOTE — Telephone Encounter (Signed)
Message copied by Amado Coe on Fri Mar 09, 2014 10:29 AM ------      Message from: Elby Showers      Created: Fri Mar 09, 2014 10:22 AM       Call patient WBC is  Normal. ------

## 2014-03-09 NOTE — Telephone Encounter (Signed)
Patient informed of normal WBC.

## 2014-03-14 ENCOUNTER — Other Ambulatory Visit: Payer: Self-pay

## 2014-03-14 MED ORDER — METFORMIN HCL 500 MG PO TABS: ORAL_TABLET | ORAL | Status: DC

## 2014-03-14 MED ORDER — LOSARTAN POTASSIUM 100 MG PO TABS: ORAL_TABLET | ORAL | Status: DC

## 2014-04-02 ENCOUNTER — Telehealth: Payer: Self-pay | Admitting: Internal Medicine

## 2014-04-02 MED ORDER — HYDROCODONE-ACETAMINOPHEN 10-325 MG PO TABS: 1.0000 | ORAL_TABLET | Freq: Three times a day (TID) | ORAL | Status: DC

## 2014-04-02 NOTE — Telephone Encounter (Signed)
New Norco 10/325 rx printed and increased to TID.  Patient aware.

## 2014-04-02 NOTE — Telephone Encounter (Signed)
Patient is calling for refill on his Norco 10-325.  Currently takes 1 by mouth 2 times daily as needed for pain.  States that "he needs to up his dosage by 1 more pill."  States that when he plays golf, his foot bothers him and he needs to take a 3rd pill on those days.  Advised I wasn't sure if you would go for that without seeing him, but I would certainly put the request in.    Please call patient when prescription is ready for pick up.

## 2014-04-02 NOTE — Telephone Encounter (Signed)
Increase to tid. Give one month supply

## 2014-04-17 ENCOUNTER — Telehealth: Payer: Self-pay | Admitting: Internal Medicine

## 2014-04-17 MED ORDER — LEVOFLOXACIN 500 MG PO TABS: 500.0000 mg | ORAL_TABLET | Freq: Every day | ORAL | Status: DC

## 2014-04-17 NOTE — Telephone Encounter (Signed)
Call in [place] 500 mg daily x 10 days

## 2014-04-17 NOTE — Telephone Encounter (Signed)
Patient states that you have given him an antibiotic in the past to take with him out of town while traveling in the event that he gets sick.  He wants to know if you will renew that Rx for him?     [facility] is who they use.

## 2014-04-17 NOTE — Telephone Encounter (Signed)
Rx sent to pharmacy. Patient aware. 

## 2014-04-23 ENCOUNTER — Other Ambulatory Visit: Payer: Self-pay

## 2014-04-23 MED ORDER — AMLODIPINE BESYLATE 5 MG PO TABS: 5.0000 mg | ORAL_TABLET | Freq: Every day | ORAL | Status: DC

## 2014-04-28 NOTE — Progress Notes (Signed)
   Subjective:    Patient ID: Roberto Reid, male    DOB: December 15, 1932, 80 y.o.   MRN: [id number]  HPI  Patient here today to follow-up on cellulitis of right leg. He has had a recurrence recently on a trip. History of recurrent cellulitis in that leg ever since he was hospitalized for saline as related possible spider bite. He was away on September 25 and antibiotic had to be called in for him. He needs to keep Levaquin on hand because he travels out of town frequently.   Review of Systems     Objective:   Physical Exam  Stasis changes and right lower extremity. No evidence of cellulitis today.      Assessment & Plan:  Recurrent cellulitis right lower extremity  Plan: Make sure patient has Levaquin 500 milligrams daily for 10 days on hand to take with him when he travels

## 2014-04-28 NOTE — Patient Instructions (Signed)
Keep refill on Levaquin 500 milligrams daily  for 10 days on hand and take this medication with you when you travel

## 2014-05-03 ENCOUNTER — Telehealth: Payer: Self-pay | Admitting: Internal Medicine

## 2014-05-03 MED ORDER — HYDROCODONE-ACETAMINOPHEN 10-325 MG PO TABS: 1.0000 | ORAL_TABLET | Freq: Three times a day (TID) | ORAL | Status: DC

## 2014-05-03 NOTE — Telephone Encounter (Signed)
Patient would like refill on his Norco Rx 10-325 mg.  Would like to pick up today.  Please call him when ready.

## 2014-05-03 NOTE — Telephone Encounter (Signed)
Rx Refill Printed & Patient Notified

## 2014-05-29 ENCOUNTER — Other Ambulatory Visit: Payer: Self-pay | Admitting: Internal Medicine

## 2014-06-04 ENCOUNTER — Other Ambulatory Visit: Payer: Self-pay | Admitting: *Deleted

## 2014-06-04 ENCOUNTER — Telehealth: Payer: Self-pay | Admitting: Internal Medicine

## 2014-06-04 MED ORDER — HYDROCODONE-ACETAMINOPHEN 10-325 MG PO TABS: 1.0000 | ORAL_TABLET | Freq: Three times a day (TID) | ORAL | Status: DC

## 2014-06-04 NOTE — Telephone Encounter (Signed)
Patient would like his Norco refilled.  10-325.  Last filled 05/03/14.  Please call patient when ready to pick up.    Thanks.

## 2014-06-04 NOTE — Telephone Encounter (Signed)
Please refill for 30 days 

## 2014-06-04 NOTE — Telephone Encounter (Signed)
Patient notified Norco script is ready for pick up

## 2014-06-05 ENCOUNTER — Other Ambulatory Visit: Payer: Self-pay | Admitting: *Deleted

## 2014-06-05 MED ORDER — ZOLPIDEM TARTRATE 10 MG PO TABS: 10.0000 mg | ORAL_TABLET | Freq: Every evening | ORAL | Status: DC | PRN

## 2014-07-04 ENCOUNTER — Telehealth: Payer: Self-pay | Admitting: *Deleted

## 2014-07-04 NOTE — Telephone Encounter (Signed)
Spoke with patient regarding pre authorization of his AndroGel patient has not had recent evaluation of testosterone level. Patient advised to schedule appt with urology for evaluation and need for AndoGel.

## 2014-07-05 ENCOUNTER — Other Ambulatory Visit: Payer: Self-pay | Admitting: *Deleted

## 2014-07-05 ENCOUNTER — Telehealth: Payer: Self-pay | Admitting: Internal Medicine

## 2014-07-05 MED ORDER — HYDROCODONE-ACETAMINOPHEN 10-325 MG PO TABS: 1.0000 | ORAL_TABLET | Freq: Three times a day (TID) | ORAL | Status: DC

## 2014-07-05 NOTE — Telephone Encounter (Signed)
Please see if time for refill and advise me

## 2014-07-05 NOTE — Telephone Encounter (Signed)
Script for hydrocodone printed for Dr Renold Genta to sign

## 2014-07-05 NOTE — Telephone Encounter (Signed)
Notified patient script for hydrocodone ready for pick up

## 2014-07-05 NOTE — Telephone Encounter (Signed)
Pt called would like refill on HYDROcodone-acetaminophen (NORCO) 10-325 MG per tablet Best number to call pt [phone] / lt

## 2014-07-18 ENCOUNTER — Other Ambulatory Visit: Payer: Self-pay | Admitting: *Deleted

## 2014-07-18 ENCOUNTER — Telehealth: Payer: Self-pay | Admitting: *Deleted

## 2014-07-18 MED ORDER — TESTOSTERONE 20.25 MG/ACT (1.62%) TD GEL: TRANSDERMAL | Status: DC

## 2014-07-18 NOTE — Telephone Encounter (Signed)
Patient called and states his appt for Urology isn't till middle of March wants to know if you will refill his Andro Gel for 1 more month.Please advise

## 2014-07-18 NOTE — Telephone Encounter (Signed)
Please refill. It may require prior authorization

## 2014-07-18 NOTE — Telephone Encounter (Signed)
Patient given 1 refill on Androgel . Explained to patient this might require prior authorization from his insurance company which is why he needs to follow up with urology for current information to get this approved . Explained to patient he may have to pay out of pocket for this medication.

## 2014-08-03 ENCOUNTER — Telehealth: Payer: Self-pay | Admitting: *Deleted

## 2014-08-03 MED ORDER — HYDROCODONE-ACETAMINOPHEN 10-325 MG PO TABS: 1.0000 | ORAL_TABLET | Freq: Three times a day (TID) | ORAL | Status: DC

## 2014-08-03 NOTE — Telephone Encounter (Signed)
So give him one Rx dated March 4 and another one dated April 4 for 30 days each of Norco. He needs CPE

## 2014-08-03 NOTE — Telephone Encounter (Signed)
Patient would like his Hydrocodone refilled to pick up today also he states he would like a script printed for April because he is leaving out of the country 09/01/2014 and needs to get it filled before he leaves. Please advise.

## 2014-08-07 ENCOUNTER — Other Ambulatory Visit: Payer: Medicare Other | Admitting: Internal Medicine

## 2014-08-07 DIAGNOSIS — R5383 Other fatigue: Secondary | ICD-10-CM

## 2014-08-07 DIAGNOSIS — E119 Type 2 diabetes mellitus without complications: Secondary | ICD-10-CM

## 2014-08-07 DIAGNOSIS — E785 Hyperlipidemia, unspecified: Secondary | ICD-10-CM

## 2014-08-07 DIAGNOSIS — R7989 Other specified abnormal findings of blood chemistry: Secondary | ICD-10-CM

## 2014-08-07 DIAGNOSIS — Z79899 Other long term (current) drug therapy: Secondary | ICD-10-CM

## 2014-08-07 DIAGNOSIS — Z125 Encounter for screening for malignant neoplasm of prostate: Secondary | ICD-10-CM

## 2014-08-07 LAB — COMPLETE METABOLIC PANEL WITH GFR
ALBUMIN: 3.7 g/dL (ref 3.5–5.2)
ALT: 17 U/L (ref 0–53)
AST: 16 U/L (ref 0–37)
Alkaline Phosphatase: 27 U/L — ABNORMAL LOW (ref 39–117)
BUN: 23 mg/dL (ref 6–23)
CALCIUM: 9 mg/dL (ref 8.4–10.5)
CHLORIDE: 104 meq/L (ref 96–112)
CO2: 26 meq/L (ref 19–32)
Creat: 1.32 mg/dL (ref 0.50–1.35)
GFR, Est African American: 58 mL/min — ABNORMAL LOW
GFR, Est Non African American: 51 mL/min — ABNORMAL LOW
Glucose, Bld: 127 mg/dL — ABNORMAL HIGH (ref 70–99)
POTASSIUM: 4.5 meq/L (ref 3.5–5.3)
Sodium: 141 mEq/L (ref 135–145)
Total Bilirubin: 1.2 mg/dL (ref 0.2–1.2)
Total Protein: 6 g/dL (ref 6.0–8.3)

## 2014-08-07 LAB — LIPID PANEL
Cholesterol: 120 mg/dL (ref 0–200)
HDL: 41 mg/dL (ref >=40)
LDL Cholesterol: 50 mg/dL (ref 0–99)
Total CHOL/HDL Ratio: 2.9 Ratio
Triglycerides: 145 mg/dL (ref <150)
VLDL: 29 mg/dL (ref 0–40)

## 2014-08-07 LAB — CBC WITH DIFFERENTIAL/PLATELET
Basophils Absolute: 0 10*3/uL (ref 0.0–0.1)
Basophils Relative: 1 % (ref 0–1)
Eosinophils Absolute: 0.1 10*3/uL (ref 0.0–0.7)
Eosinophils Relative: 2 % (ref 0–5)
HCT: 39.2 % (ref 39.0–52.0)
HEMOGLOBIN: 13 g/dL (ref 13.0–17.0)
LYMPHS ABS: 1.1 10*3/uL (ref 0.7–4.0)
LYMPHS PCT: 25 % (ref 12–46)
MCH: 32 pg (ref 26.0–34.0)
MCHC: 33.2 g/dL (ref 30.0–36.0)
MCV: 96.6 fL (ref 78.0–100.0)
MPV: 9.8 fL (ref 8.6–12.4)
Monocytes Absolute: 0.4 10*3/uL (ref 0.1–1.0)
Monocytes Relative: 10 % (ref 3–12)
NEUTROS PCT: 62 % (ref 43–77)
Neutro Abs: 2.7 10*3/uL (ref 1.7–7.7)
PLATELETS: 216 10*3/uL (ref 150–400)
RBC: 4.06 MIL/uL — AB (ref 4.22–5.81)
RDW: 14.6 % (ref 11.5–15.5)
WBC: 4.4 10*3/uL (ref 4.0–10.5)

## 2014-08-07 NOTE — Addendum Note (Signed)
Addended by: Leota Jacobsen on: 08/07/2014 10:12 AM   Modules accepted: Orders

## 2014-08-07 NOTE — Addendum Note (Signed)
Addended by: Leota Jacobsen on: 08/07/2014 10:17 AM   Modules accepted: Orders

## 2014-08-08 LAB — HEMOGLOBIN A1C
Hgb A1c MFr Bld: 6.4 % — ABNORMAL HIGH (ref <5.7)
Mean Plasma Glucose: 137 mg/dL — ABNORMAL HIGH (ref <117)

## 2014-08-08 LAB — PSA, MEDICARE: PSA: 0.5 ng/mL (ref <=4.00)

## 2014-08-08 LAB — TESTOSTERONE: TESTOSTERONE: 454 ng/dL (ref 300–890)

## 2014-08-17 ENCOUNTER — Other Ambulatory Visit: Payer: Self-pay | Admitting: Internal Medicine

## 2014-08-20 ENCOUNTER — Ambulatory Visit
Admission: RE | Admit: 2014-08-20 | Discharge: 2014-08-20 | Disposition: A | Discharge status: Home / Self Care | Payer: Medicare Other | Source: Ambulatory Visit | Attending: Internal Medicine | Admitting: Internal Medicine

## 2014-08-20 ENCOUNTER — Ambulatory Visit (INDEPENDENT_AMBULATORY_CARE_PROVIDER_SITE_OTHER): Payer: Medicare Other | Admitting: Internal Medicine

## 2014-08-20 ENCOUNTER — Telehealth: Payer: Self-pay | Admitting: *Deleted

## 2014-08-20 ENCOUNTER — Encounter: Payer: Self-pay | Admitting: Internal Medicine

## 2014-08-20 VITALS — BP 142/80 | HR 84 | Temp 97.6°F | Wt 200.0 lb

## 2014-08-20 DIAGNOSIS — Z720 Tobacco use: Secondary | ICD-10-CM

## 2014-08-20 DIAGNOSIS — H903 Sensorineural hearing loss, bilateral: Secondary | ICD-10-CM

## 2014-08-20 DIAGNOSIS — L089 Local infection of the skin and subcutaneous tissue, unspecified: Secondary | ICD-10-CM

## 2014-08-20 DIAGNOSIS — N529 Male erectile dysfunction, unspecified: Secondary | ICD-10-CM

## 2014-08-20 DIAGNOSIS — Z87891 Personal history of nicotine dependence: Secondary | ICD-10-CM

## 2014-08-20 DIAGNOSIS — F5104 Psychophysiologic insomnia: Secondary | ICD-10-CM

## 2014-08-20 DIAGNOSIS — L03115 Cellulitis of right lower limb: Secondary | ICD-10-CM

## 2014-08-20 DIAGNOSIS — S90414A Abrasion, right lesser toe(s), initial encounter: Secondary | ICD-10-CM

## 2014-08-20 DIAGNOSIS — E785 Hyperlipidemia, unspecified: Secondary | ICD-10-CM

## 2014-08-20 DIAGNOSIS — E291 Testicular hypofunction: Secondary | ICD-10-CM | POA: Diagnosis not present

## 2014-08-20 DIAGNOSIS — Z86010 Personal history of colonic polyps: Secondary | ICD-10-CM | POA: Diagnosis not present

## 2014-08-20 DIAGNOSIS — M79671 Pain in right foot: Secondary | ICD-10-CM

## 2014-08-20 DIAGNOSIS — Z8719 Personal history of other diseases of the digestive system: Secondary | ICD-10-CM

## 2014-08-20 DIAGNOSIS — Z0000 Encounter for general adult medical examination without abnormal findings: Secondary | ICD-10-CM | POA: Diagnosis not present

## 2014-08-20 DIAGNOSIS — G8929 Other chronic pain: Secondary | ICD-10-CM

## 2014-08-20 DIAGNOSIS — G4700 Insomnia, unspecified: Secondary | ICD-10-CM

## 2014-08-20 DIAGNOSIS — K219 Gastro-esophageal reflux disease without esophagitis: Secondary | ICD-10-CM | POA: Diagnosis not present

## 2014-08-20 DIAGNOSIS — E119 Type 2 diabetes mellitus without complications: Secondary | ICD-10-CM | POA: Diagnosis not present

## 2014-08-20 DIAGNOSIS — Z87442 Personal history of urinary calculi: Secondary | ICD-10-CM

## 2014-08-20 DIAGNOSIS — R7989 Other specified abnormal findings of blood chemistry: Secondary | ICD-10-CM

## 2014-08-20 LAB — POCT URINALYSIS DIPSTICK
Bilirubin, UA: NEGATIVE
Blood, UA: NEGATIVE
Glucose, UA: NEGATIVE
Ketones, UA: NEGATIVE
LEUKOCYTES UA: NEGATIVE
Nitrite, UA: NEGATIVE
PROTEIN UA: NEGATIVE
SPEC GRAV UA: 1.01
Urobilinogen, UA: NEGATIVE
pH, UA: 6

## 2014-08-20 MED ORDER — CEFTRIAXONE SODIUM 1 G IJ SOLR
1.0000 g | Freq: Once | INTRAMUSCULAR | Status: AC
  Administered 2014-08-20: 1 g via INTRAMUSCULAR

## 2014-08-20 NOTE — Telephone Encounter (Signed)
Reviewed x ray results with patient

## 2014-08-25 NOTE — Progress Notes (Signed)
Subjective:    Patient ID: Roberto Reid, male    DOB: Oct 07, 1932, 81 y.o.   MRN: [id number]  HPI  81 year old White male in today for health maintenance exam and evaluation of medical issues. New issue today is fever and apparent infection of right third toe. Seemed to start after [place].  History of hypertension, controlled type 2 diabetes mellitus, low testosterone, erectile dysfunction. History of left knee replacement July 2013. History of recurrent cellulitis right leg related to an apparent spider bite 2008. Right toe crosses over second toe. He says he has a broken pin and in that foot that did not heal well postoperatively.  History of right second metatarsalgia secondary to Morton's foot.History of old Lisfranc fracture subluxation with instability of first and second tarsal metatarsal joints causing chronic pain. Had consultation in [place] regarding further surgery on foot and none was recommended.   Fractured ankle 1953 secondary to football injury. Fractured ribs 1950 and 1956.  Remote history of cluster headaches seen by Dr. Erling Cruz 2001.  Allergy skin testing done 2010 showing positive test to cat and some molds. Minimal reactivity to dog. Spirometry was normal.  History of dyshidrotic hand eczema.  Has been evaluated in the past by Dr. Lia Foyer, cardiologist. Cardiolite study 2008 negative.  History of hearing loss. Hearing tested in 2010 revealing mild to moderate high frequency hearing loss bilaterally.  History of Barrett's esophagus and adenomatous colon polyp 2008. Right kidney stones 2007 and 1997. Surgery  To repair fractured arm 1942. Kidney stone removed in 1997. Reportedly history of uric acid stones 2007 treated by Dr. Jeffie Pollock.  History of GE reflux, osteoarthritis, hearing loss. He has chronic musculoskeletal pain due to osteoarthritis and chronic foot problem treated with hydrocodone/APAP 10/650 twice a day.  Has had cataract  extraction both eyes November 2010. Left knee surgery done by Dr. supple for torn cartilage November 2005. He was hospitalized in [place] September 2011 with right leg cellulitis and treated with IV antibiotics.  Social history: He has been married twice. He is a nonsmoker presently but formerly smoked and quit in 1990 having smoked a pack a day for 32 years. Social alcohol consumption. 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  Family history: Father died at age 30 with Parkinson's disease. Father had history of diabetes. Mother died at age 82 of unknown causes. 2 sisters in good health. A son and a daughter in good health.  Review of Systems Issue today is infection of right third toe. Chronic foot pain requiring narcotic pain medication. Recurrent cellulitis right leg. Controls diabetes with diet. Plays golf for exercise.    Objective:   Physical Exam  Constitutional: He is oriented to person, place, and time. He appears well-developed and well-nourished. No distress.  HENT:  Head: Normocephalic and atraumatic.  Right Ear: External ear normal.  Left Ear: External ear normal.  Mouth/Throat: Oropharynx is clear and moist. No oropharyngeal exudate.  Eyes: Conjunctivae and EOM are normal. Pupils are equal, round, and reactive to light. Right eye exhibits no discharge. Left eye exhibits no discharge.  Neck: Neck supple. No JVD present. No thyromegaly present.  Cardiovascular: Normal rate, regular rhythm, normal heart sounds and intact distal pulses.   No murmur heard. Pulmonary/Chest: Effort normal and breath sounds normal. No respiratory distress. He has no wheezes. He has no rales. He exhibits no tenderness.  Abdominal: Soft. Bowel sounds are normal. He exhibits no distension  and no mass. There is no tenderness. There is no rebound and no guarding.  Musculoskeletal: Normal range of motion. He exhibits no edema.  Erythema and swelling right third toe. Right foot  deformity unchanged. Postinflammatory hyperpigmentation right lower leg  Lymphadenopathy:    He has no cervical adenopathy.  Neurological: He is alert and oriented to person, place, and time. He has normal reflexes. No cranial nerve deficit. Coordination normal.  Skin: Skin is warm and dry. No rash noted. He is not diaphoretic.  Psychiatric: He has a normal mood and affect. His behavior is normal. Judgment and thought content normal.  Vitals reviewed.         Assessment & Plan:  Infected right third toe-C treatment below  Chronic recurrent cellulitis right lower extremity probably related to some lymphatic insufficiency after initial episode 2008  Essential hypertension  Hyperlipidemia  Type 2 diabetes mellitus without complications  History of kidney stones  Chronic foot deformity requiring chronic narcotic pain medication  Hearing loss  GE reflux  History of Barrett's esophagus  History of adenomatous colon polyps   Chronic insomnia for which he takes Ambien nightly  Plan: Continue same medications and return in 6 months. Diabetes control with metformin at present time. 1 g IM Rocephin given today in the office. Restart Levaquin 500 milligrams daily for 10 days for toe infection.    Subjective:   Patient presents for Medicare Annual/Subsequent preventive examination.  Review Past Medical/Family/Social:   Risk Factors  Current exercise habits:  Dietary issues discussed:   Cardiac risk factors:  Depression Screen  (Note: if answer to either of the following is "Yes", a more complete depression screening is indicated)   Over the past two weeks, have you felt down, depressed or hopeless? No  Over the past two weeks, have you felt little interest or pleasure in doing things? No Have you lost interest or pleasure in daily life? No Do you often feel hopeless? No Do you cry easily over simple problems? No   Activities of Daily Living  In your present state of  health, do you have any difficulty performing the following activities?:   Driving? No  Managing money? No  Feeding yourself? No  Getting from bed to chair? No  Climbing a flight of stairs? No  Preparing food and eating?: No  Bathing or showering? No  Getting dressed: No  Getting to the toilet? No  Using the toilet:No  Moving around from place to place: No  In the past year have you fallen or had a near fall?:No  Are you sexually active? yes Do you have more than one partner? No   Hearing Difficulties: No  Do you often ask people to speak up or repeat themselves? No  Do you experience ringing or noises in your ears? No  Do you have difficulty understanding soft or whispered voices? yes Do you feel that you have a problem with memory? occasional Do you often misplace items? No    Home Safety:  Do you have a smoke alarm at your residence? Yes Do you have grab bars in the bathroom? no Do you have throw rugs in your house? yes   Cognitive Testing  Alert? Yes Normal Appearance?Yes  Oriented to person? Yes Place? Yes  Time? Yes  Recall of three objects? Yes  Can perform simple calculations? Yes  Displays appropriate judgment?Yes  Can read the correct time from a watch face?Yes   List the Names of Other Physician/Practitioners you currently use:  See referral list for the physicians patient is currently seeing.  Dr. Jeffie Pollock, urologist Dr. Ronnie Derby orthopedist   Review of Systems: See above   Objective:     General appearance: Appears stated age and mildly obese  Head: Normocephalic, without obvious abnormality, atraumatic  Eyes: conj clear, EOMi PEERLA  Ears: normal TM's and external ear canals both ears  Nose: Nares normal. Septum midline. Mucosa normal. No drainage or sinus tenderness.  Throat: lips, mucosa, and tongue normal; teeth and gums normal  Neck: no adenopathy, no carotid bruit, no JVD, supple, symmetrical, trachea midline and thyroid not enlarged, symmetric,  no tenderness/mass/nodules  No CVA tenderness.  Lungs: clear to auscultation bilaterally  Breasts: normal appearance, no masses or tenderness Heart: regular rate and rhythm, S1, S2 normal, no murmur, click, rub or gallop  Abdomen: soft, non-tender; bowel sounds normal; no masses, no organomegaly  Musculoskeletal: ROM normal in all joints, no crepitus, no deformity, Normal muscle strengthen. Back  is symmetric, no curvature. Skin: Skin color, texture, turgor normal. No rashes or lesions  Lymph nodes: Cervical, supraclavicular, and axillary nodes normal.  Neurologic: CN 2 -12 Normal, Normal symmetric reflexes. Normal coordination and gait  Psych: Alert & Oriented x 3, Mood appear stable.    Assessment:    Annual wellness medicare exam   Plan:    During the course of the visit the patient was educated and counseled about appropriate screening and preventive services including:  Prevnar 13-discussed  Tetanus immunization is up-to-date      Patient Instructions (the written plan) was given to the patient.  Medicare Attestation  I have personally reviewed:  The patient's medical and social history  Their use of alcohol, tobacco or illicit drugs  Their current medications and supplements  The patient's functional ability including ADLs,fall risks, home safety risks, cognitive, and hearing and visual impairment  Diet and physical activities  Evidence for depression or mood disorders  The patient's weight, height, BMI, and visual acuity have been recorded in the chart. I have made referrals, counseling, and provided education to the patient based on review of the above and I have provided the patient with a written personalized care plan for preventive services.

## 2014-09-24 ENCOUNTER — Ambulatory Visit: Payer: Medicare Other | Admitting: Internal Medicine

## 2014-09-26 ENCOUNTER — Telehealth: Payer: Self-pay | Admitting: Internal Medicine

## 2014-09-26 NOTE — Telephone Encounter (Signed)
Refill x 6 months 

## 2014-09-27 ENCOUNTER — Other Ambulatory Visit (HOSPITAL_COMMUNITY): Payer: Self-pay | Admitting: Orthopedic Surgery

## 2014-09-27 ENCOUNTER — Other Ambulatory Visit: Payer: Self-pay | Admitting: *Deleted

## 2014-09-27 ENCOUNTER — Other Ambulatory Visit: Payer: Self-pay | Admitting: Orthopedic Surgery

## 2014-09-27 DIAGNOSIS — M2560 Stiffness of unspecified joint, not elsewhere classified: Secondary | ICD-10-CM

## 2014-09-27 DIAGNOSIS — M25561 Pain in right knee: Secondary | ICD-10-CM

## 2014-09-27 DIAGNOSIS — M25569 Pain in unspecified knee: Secondary | ICD-10-CM | POA: Insufficient documentation

## 2014-09-27 MED ORDER — ZOLPIDEM TARTRATE 10 MG PO TABS: 10.0000 mg | ORAL_TABLET | Freq: Every evening | ORAL | Status: DC | PRN

## 2014-09-27 NOTE — Telephone Encounter (Signed)
ambien refill called into pharmacy

## 2014-09-28 ENCOUNTER — Other Ambulatory Visit: Payer: Self-pay | Admitting: *Deleted

## 2014-09-28 MED ORDER — HYDROCODONE-ACETAMINOPHEN 10-325 MG PO TABS: 1.0000 | ORAL_TABLET | Freq: Three times a day (TID) | ORAL | Status: DC

## 2014-09-28 NOTE — Telephone Encounter (Signed)
Script for hydrocodone printed for Dr Renold Genta to sign

## 2014-09-28 NOTE — Telephone Encounter (Signed)
Pt called and would like refill on HYDROcodone-acetaminophen (NORCO) 10-325 MG per tablet.  Best number to call pt is [phone] / lt

## 2014-09-28 NOTE — Telephone Encounter (Signed)
Give #90 with no refill 

## 2014-09-30 ENCOUNTER — Ambulatory Visit
Admission: RE | Admit: 2014-09-30 | Discharge: 2014-09-30 | Disposition: A | Discharge status: Home / Self Care | Payer: Medicare Other | Source: Ambulatory Visit | Attending: Orthopedic Surgery | Admitting: Orthopedic Surgery

## 2014-09-30 DIAGNOSIS — M25561 Pain in right knee: Secondary | ICD-10-CM

## 2014-09-30 DIAGNOSIS — M2560 Stiffness of unspecified joint, not elsewhere classified: Secondary | ICD-10-CM

## 2014-10-02 ENCOUNTER — Other Ambulatory Visit: Payer: Self-pay | Admitting: Orthopedic Surgery

## 2014-10-04 ENCOUNTER — Encounter (HOSPITAL_COMMUNITY)
Admission: RE | Admit: 2014-10-04 | Discharge: 2014-10-04 | Disposition: A | Discharge status: Home / Self Care | Payer: Medicare Other | Source: Ambulatory Visit | Attending: Orthopedic Surgery | Admitting: Orthopedic Surgery

## 2014-10-04 ENCOUNTER — Other Ambulatory Visit (HOSPITAL_COMMUNITY): Payer: Self-pay

## 2014-10-04 ENCOUNTER — Encounter (HOSPITAL_COMMUNITY): Payer: Self-pay

## 2014-10-04 DIAGNOSIS — S76111A Strain of right quadriceps muscle, fascia and tendon, initial encounter: Secondary | ICD-10-CM | POA: Diagnosis not present

## 2014-10-04 DIAGNOSIS — K219 Gastro-esophageal reflux disease without esophagitis: Secondary | ICD-10-CM | POA: Diagnosis not present

## 2014-10-04 DIAGNOSIS — E785 Hyperlipidemia, unspecified: Secondary | ICD-10-CM | POA: Diagnosis not present

## 2014-10-04 DIAGNOSIS — E119 Type 2 diabetes mellitus without complications: Secondary | ICD-10-CM | POA: Diagnosis not present

## 2014-10-04 DIAGNOSIS — I10 Essential (primary) hypertension: Secondary | ICD-10-CM | POA: Diagnosis not present

## 2014-10-04 DIAGNOSIS — Z87891 Personal history of nicotine dependence: Secondary | ICD-10-CM | POA: Diagnosis not present

## 2014-10-04 DIAGNOSIS — J45909 Unspecified asthma, uncomplicated: Secondary | ICD-10-CM | POA: Diagnosis not present

## 2014-10-04 DIAGNOSIS — S83241A Other tear of medial meniscus, current injury, right knee, initial encounter: Secondary | ICD-10-CM | POA: Diagnosis not present

## 2014-10-04 DIAGNOSIS — Z96652 Presence of left artificial knee joint: Secondary | ICD-10-CM | POA: Diagnosis not present

## 2014-10-04 DIAGNOSIS — W19XXXA Unspecified fall, initial encounter: Secondary | ICD-10-CM | POA: Diagnosis not present

## 2014-10-04 LAB — BASIC METABOLIC PANEL
ANION GAP: 9 (ref 5–15)
BUN: 19 mg/dL (ref 6–20)
CO2: 27 mmol/L (ref 22–32)
CREATININE: 1.32 mg/dL — AB (ref 0.61–1.24)
Calcium: 8.9 mg/dL (ref 8.9–10.3)
Chloride: 103 mmol/L (ref 101–111)
GFR calc Af Amer: 57 mL/min — ABNORMAL LOW (ref >60)
GFR calc non Af Amer: 49 mL/min — ABNORMAL LOW (ref >60)
GLUCOSE: 116 mg/dL — AB (ref 70–99)
Potassium: 4.4 mmol/L (ref 3.5–5.1)
Sodium: 139 mmol/L (ref 135–145)

## 2014-10-04 LAB — CBC
HCT: 41.5 % (ref 39.0–52.0)
HEMOGLOBIN: 14.1 g/dL (ref 13.0–17.0)
MCH: 32.3 pg (ref 26.0–34.0)
MCHC: 34 g/dL (ref 30.0–36.0)
MCV: 95.2 fL (ref 78.0–100.0)
Platelets: 270 10*3/uL (ref 150–400)
RBC: 4.36 MIL/uL (ref 4.22–5.81)
RDW: 12.4 % (ref 11.5–15.5)
WBC: 9.3 10*3/uL (ref 4.0–10.5)

## 2014-10-04 MED ORDER — CHLORHEXIDINE GLUCONATE 4 % EX LIQD
60.0000 mL | Freq: Once | CUTANEOUS | Status: DC
  Filled 2014-10-04: qty 60

## 2014-10-04 NOTE — Progress Notes (Signed)
This patient scored at an elevated risk for obstructive sleep apnea using the STOP BANG TOOL during a pre surgical testing 

## 2014-10-04 NOTE — Progress Notes (Signed)
Pt and spouse stated BP meds not taken today.  Both instructed to be sure to take as directed and AM of surgery. Pt and spouse verbalize understanding

## 2014-10-04 NOTE — Pre-Procedure Instructions (Addendum)
Roberto Reid  10/04/2014   Your procedure is scheduled on:  Oct 08, 2014  Report to [facility] Admitting at 9:10 AM.  Call this number if you have problems the morning of surgery: [phone]   Remember:   Do not eat food or drink liquids after midnight.   Take these medicines the morning of surgery with A SIP OF WATER: amLODipine (NORVASC) esomeprazole (NEXIUM)  STOP ASPIRIN, NSAIDS (ALEVE, ADVIL, IBUPROFEN), FISH OIL, HERBAL SUPPLEMENTS   Do not wear jewelry.  Do not wear lotions, powders, or colognes. You may wear deodorant.  Men may shave face and neck.  Do not bring valuables to the hospital.  [facility] is not responsible for any belongings or valuables.               Contacts, dentures or bridgework may not be worn into surgery.  Leave suitcase in the car. After surgery it may be brought to your room.  For patients admitted to the hospital, discharge time is determined by your                treatment team.               Patients discharged the day of surgery will not be allowed to drive  home.  Name and phone number of your driver: family/friend  Special Instructions: "PREPARING FOR SURGERY"   Please read over the following fact sheets that you were given: Pain Booklet, Coughing and Deep Breathing and Surgical Site Infection Prevention

## 2014-10-05 MED ORDER — CEFAZOLIN SODIUM-DEXTROSE 2-3 GM-% IV SOLR
2.0000 g | INTRAVENOUS | Status: AC
  Administered 2014-10-08: 2 g via INTRAVENOUS
  Filled 2014-10-05: qty 50

## 2014-10-05 NOTE — Progress Notes (Signed)
Anesthesia Chart Review:  Pt is 81 year old male scheduled for R knee arthroscopy on 10/08/2014 with Dr. Ronnie Derby.   PMH includes: HTN, hyperlipidemia, DM, asthma, GERD. Former smoker. BMI 27. S/p L TKA 12/14/11.   BP at PAT was 188/98. Pt reportedly did not take his BP meds that day. RN instructed pt to take meds as directed including DOS.   EKG: Sinus rhythm with Premature supraventricular complexes. RBBB.   Echo 07/02/2006: - Overall left ventricular systolic function was normal. There were no left ventricular regional wall motion abnormalities. Left ventricular wall thickness was mildly increased. - The aortic valve was mildly calcified. - There was mild fibrocalcific change of the aortic root. - There was mild mitral annular calcification. - Left atrial size was at the upper limits of normal.  If BP acceptable DOS, I anticipate pt can proceed as scheduled.   Willeen Cass, FNP-BC [facility]/Anesthesiology Phone: [phone] 10/05/2014 2:00 PM

## 2014-10-08 ENCOUNTER — Telehealth: Payer: Self-pay | Admitting: Internal Medicine

## 2014-10-08 ENCOUNTER — Ambulatory Visit (HOSPITAL_COMMUNITY)
Admission: RE | Admit: 2014-10-08 | Discharge: 2014-10-08 | Disposition: A | Discharge status: Home / Self Care | Payer: Medicare Other | Source: Ambulatory Visit | Attending: Orthopedic Surgery | Admitting: Orthopedic Surgery

## 2014-10-08 ENCOUNTER — Ambulatory Visit (HOSPITAL_COMMUNITY): Payer: Medicare Other | Admitting: Anesthesiology

## 2014-10-08 ENCOUNTER — Encounter (HOSPITAL_COMMUNITY)
Admission: RE | Disposition: A | Discharge status: Home / Self Care | Payer: Self-pay | Source: Ambulatory Visit | Attending: Orthopedic Surgery

## 2014-10-08 ENCOUNTER — Ambulatory Visit (HOSPITAL_COMMUNITY): Payer: Medicare Other | Admitting: Emergency Medicine

## 2014-10-08 ENCOUNTER — Encounter (HOSPITAL_COMMUNITY): Payer: Self-pay | Admitting: *Deleted

## 2014-10-08 DIAGNOSIS — E119 Type 2 diabetes mellitus without complications: Secondary | ICD-10-CM | POA: Insufficient documentation

## 2014-10-08 DIAGNOSIS — Z87891 Personal history of nicotine dependence: Secondary | ICD-10-CM | POA: Insufficient documentation

## 2014-10-08 DIAGNOSIS — S76111A Strain of right quadriceps muscle, fascia and tendon, initial encounter: Secondary | ICD-10-CM | POA: Diagnosis not present

## 2014-10-08 DIAGNOSIS — S83241A Other tear of medial meniscus, current injury, right knee, initial encounter: Secondary | ICD-10-CM | POA: Insufficient documentation

## 2014-10-08 DIAGNOSIS — E785 Hyperlipidemia, unspecified: Secondary | ICD-10-CM | POA: Diagnosis not present

## 2014-10-08 DIAGNOSIS — J45909 Unspecified asthma, uncomplicated: Secondary | ICD-10-CM | POA: Insufficient documentation

## 2014-10-08 DIAGNOSIS — Z96652 Presence of left artificial knee joint: Secondary | ICD-10-CM | POA: Insufficient documentation

## 2014-10-08 DIAGNOSIS — I10 Essential (primary) hypertension: Secondary | ICD-10-CM | POA: Diagnosis not present

## 2014-10-08 DIAGNOSIS — W19XXXA Unspecified fall, initial encounter: Secondary | ICD-10-CM | POA: Insufficient documentation

## 2014-10-08 DIAGNOSIS — K219 Gastro-esophageal reflux disease without esophagitis: Secondary | ICD-10-CM | POA: Insufficient documentation

## 2014-10-08 HISTORY — PX: QUADRICEPS TENDON REPAIR: SHX756

## 2014-10-08 HISTORY — PX: KNEE ARTHROSCOPY: SHX127

## 2014-10-08 LAB — GLUCOSE, CAPILLARY
Glucose-Capillary: 120 mg/dL — ABNORMAL HIGH (ref 70–99)
Glucose-Capillary: 128 mg/dL — ABNORMAL HIGH (ref 70–99)

## 2014-10-08 SURGERY — ARTHROSCOPY, KNEE
Anesthesia: General | Site: Knee | Laterality: Right

## 2014-10-08 MED ORDER — HYDROMORPHONE HCL 1 MG/ML IJ SOLN
0.5000 mg | INTRAMUSCULAR | Status: AC | PRN
  Administered 2014-10-08 (×4): 0.5 mg via INTRAVENOUS

## 2014-10-08 MED ORDER — METHOCARBAMOL 500 MG PO TABS
ORAL_TABLET | ORAL | Status: AC
  Filled 2014-10-08: qty 2

## 2014-10-08 MED ORDER — PROPOFOL 10 MG/ML IV BOLUS
INTRAVENOUS | Status: DC | PRN
Start: 2014-10-08 — End: 2014-10-08
  Administered 2014-10-08: 20 mg via INTRAVENOUS
  Administered 2014-10-08: 170 mg via INTRAVENOUS

## 2014-10-08 MED ORDER — HYDROMORPHONE HCL 1 MG/ML IJ SOLN
INTRAMUSCULAR | Status: AC
  Administered 2014-10-08: 0.5 mg via INTRAVENOUS
  Filled 2014-10-08: qty 1

## 2014-10-08 MED ORDER — IBUPROFEN 800 MG PO TABS: 800.0000 mg | ORAL_TABLET | Freq: Three times a day (TID) | ORAL | Status: DC | PRN

## 2014-10-08 MED ORDER — HYDROCODONE-ACETAMINOPHEN 5-325 MG PO TABS
ORAL_TABLET | ORAL | Status: AC
  Administered 2014-10-08: 2 via ORAL
  Filled 2014-10-08: qty 2

## 2014-10-08 MED ORDER — ONDANSETRON HCL 4 MG/2ML IJ SOLN: 4.0000 mg | Freq: Once | INTRAMUSCULAR | Status: DC | PRN

## 2014-10-08 MED ORDER — SUCCINYLCHOLINE CHLORIDE 20 MG/ML IJ SOLN
INTRAMUSCULAR | Status: DC | PRN
  Administered 2014-10-08: 80 mg via INTRAVENOUS

## 2014-10-08 MED ORDER — FENTANYL CITRATE (PF) 100 MCG/2ML IJ SOLN
INTRAMUSCULAR | Status: AC
  Administered 2014-10-08: 100 ug via INTRAVENOUS
  Filled 2014-10-08: qty 2

## 2014-10-08 MED ORDER — FENTANYL CITRATE (PF) 100 MCG/2ML IJ SOLN
INTRAMUSCULAR | Status: DC | PRN
  Administered 2014-10-08 (×4): 25 ug via INTRAVENOUS
  Administered 2014-10-08: 100 ug via INTRAVENOUS
  Administered 2014-10-08 (×4): 25 ug via INTRAVENOUS
  Administered 2014-10-08: 50 ug via INTRAVENOUS

## 2014-10-08 MED ORDER — HYDROCODONE-ACETAMINOPHEN 5-325 MG PO TABS
2.0000 | ORAL_TABLET | Freq: Once | ORAL | Status: AC
  Administered 2014-10-08: 2 via ORAL

## 2014-10-08 MED ORDER — FENTANYL CITRATE (PF) 100 MCG/2ML IJ SOLN
50.0000 ug | INTRAMUSCULAR | Status: DC | PRN
  Administered 2014-10-08: 100 ug via INTRAVENOUS

## 2014-10-08 MED ORDER — ONDANSETRON HCL 4 MG/2ML IJ SOLN
INTRAMUSCULAR | Status: DC | PRN
  Administered 2014-10-08: 4 mg via INTRAVENOUS

## 2014-10-08 MED ORDER — LIDOCAINE HCL 4 % MT SOLN
OROMUCOSAL | Status: DC | PRN
  Administered 2014-10-08: 4 mL via TOPICAL

## 2014-10-08 MED ORDER — SODIUM CHLORIDE 0.9 % IR SOLN
Status: DC | PRN
  Administered 2014-10-08: 3000 mL

## 2014-10-08 MED ORDER — LACTATED RINGERS IV SOLN
INTRAVENOUS | Status: DC
  Administered 2014-10-08 (×3): via INTRAVENOUS

## 2014-10-08 MED ORDER — FENTANYL CITRATE (PF) 250 MCG/5ML IJ SOLN
INTRAMUSCULAR | Status: AC
  Filled 2014-10-08: qty 5

## 2014-10-08 MED ORDER — LIDOCAINE HCL (CARDIAC) 20 MG/ML IV SOLN
INTRAVENOUS | Status: DC | PRN
  Administered 2014-10-08: 85 mg via INTRAVENOUS

## 2014-10-08 MED ORDER — METHOCARBAMOL 500 MG PO TABS
1000.0000 mg | ORAL_TABLET | Freq: Once | ORAL | Status: AC
  Administered 2014-10-08: 1000 mg via ORAL

## 2014-10-08 MED ORDER — ARTIFICIAL TEARS OP OINT
TOPICAL_OINTMENT | OPHTHALMIC | Status: DC | PRN
Start: 2014-10-08 — End: 2014-10-08
  Administered 2014-10-08: 1 via OPHTHALMIC

## 2014-10-08 MED ORDER — PHENYLEPHRINE HCL 10 MG/ML IJ SOLN
INTRAMUSCULAR | Status: DC | PRN
  Administered 2014-10-08 (×3): 40 ug via INTRAVENOUS

## 2014-10-08 MED ORDER — MIDAZOLAM HCL 2 MG/2ML IJ SOLN
INTRAMUSCULAR | Status: AC
  Filled 2014-10-08: qty 2

## 2014-10-08 MED ORDER — HYDROCODONE-ACETAMINOPHEN 5-325 MG PO TABS: 1.0000 | ORAL_TABLET | Freq: Four times a day (QID) | ORAL | Status: DC | PRN

## 2014-10-08 SURGICAL SUPPLY — 64 items
BANDAGE ELASTIC 4 VELCRO ST LF (GAUZE/BANDAGES/DRESSINGS) ×1 IMPLANT
BANDAGE ELASTIC 6 VELCRO ST LF (GAUZE/BANDAGES/DRESSINGS) ×2 IMPLANT
BANDAGE ESMARK 6X9 LF (GAUZE/BANDAGES/DRESSINGS) IMPLANT
BLADE GREAT WHITE 4.2 (BLADE) ×2 IMPLANT
BNDG CMPR 9X6 STRL LF SNTH (GAUZE/BANDAGES/DRESSINGS)
BNDG ESMARK 6X9 LF (GAUZE/BANDAGES/DRESSINGS)
CUFF TOURNIQUET SINGLE 34IN LL (TOURNIQUET CUFF) IMPLANT
DRAPE ARTHROSCOPY W/POUCH 114 (DRAPES) ×2 IMPLANT
DRAPE PROXIMA HALF (DRAPES) ×2 IMPLANT
DRAPE U-SHAPE 47X51 STRL (DRAPES) ×2 IMPLANT
DRSG ADAPTIC 3X8 NADH LF (GAUZE/BANDAGES/DRESSINGS) ×1 IMPLANT
DRSG PAD ABDOMINAL 8X10 ST (GAUZE/BANDAGES/DRESSINGS) ×1 IMPLANT
ELECT CAUTERY BLADE 6.4 (BLADE) IMPLANT
ELECT REM PT RETURN 9FT ADLT (ELECTROSURGICAL) ×2
ELECTRODE REM PT RTRN 9FT ADLT (ELECTROSURGICAL) IMPLANT
GAUZE SPONGE 4X4 12PLY STRL (GAUZE/BANDAGES/DRESSINGS) ×2 IMPLANT
GAUZE XEROFORM 1X8 LF (GAUZE/BANDAGES/DRESSINGS) ×2 IMPLANT
GLOVE BIOGEL PI IND STRL 6.5 (GLOVE) IMPLANT
GLOVE BIOGEL PI IND STRL 7.5 (GLOVE) IMPLANT
GLOVE BIOGEL PI IND STRL 8.5 (GLOVE) ×2 IMPLANT
GLOVE BIOGEL PI INDICATOR 6.5 (GLOVE) ×1
GLOVE BIOGEL PI INDICATOR 7.5 (GLOVE)
GLOVE BIOGEL PI INDICATOR 8.5 (GLOVE) ×2
GLOVE ECLIPSE 6.5 STRL STRAW (GLOVE) ×1 IMPLANT
GLOVE SURG ORTHO 7.0 STRL STRW (GLOVE) IMPLANT
GLOVE SURG ORTHO 8.0 STRL STRW (GLOVE) ×4 IMPLANT
GOWN STRL REUS W/ TWL LRG LVL3 (GOWN DISPOSABLE) ×3 IMPLANT
GOWN STRL REUS W/TWL LRG LVL3 (GOWN DISPOSABLE) ×6
GOWN STRL REUS W/TWL XL LVL3 (GOWN DISPOSABLE) ×4 IMPLANT
IMMOBILIZER KNEE 22 UNIV (SOFTGOODS) ×1 IMPLANT
KIT ROOM TURNOVER OR (KITS) ×2 IMPLANT
MANIFOLD NEPTUNE II (INSTRUMENTS) ×2 IMPLANT
PACK ARTHROSCOPY DSU (CUSTOM PROCEDURE TRAY) ×2 IMPLANT
PACK ORTHO EXTREMITY (CUSTOM PROCEDURE TRAY) ×2 IMPLANT
PAD ARMBOARD 7.5X6 YLW CONV (MISCELLANEOUS) ×4 IMPLANT
PADDING CAST COTTON 6X4 STRL (CAST SUPPLIES) ×1 IMPLANT
PASSER SUT SWANSON 36MM LOOP (INSTRUMENTS) IMPLANT
PENCIL BUTTON HOLSTER BLD 10FT (ELECTRODE) ×1 IMPLANT
SET ARTHROSCOPY TUBING (MISCELLANEOUS) ×2
SET ARTHROSCOPY TUBING LN (MISCELLANEOUS) ×1 IMPLANT
SPONGE GAUZE 4X4 12PLY STER LF (GAUZE/BANDAGES/DRESSINGS) ×1 IMPLANT
SPONGE LAP 4X18 X RAY DECT (DISPOSABLE) ×2 IMPLANT
STAPLER VISISTAT 35W (STAPLE) ×1 IMPLANT
SUT ETHILON 4 0 PS 2 18 (SUTURE) ×2 IMPLANT
SUT FIBERWIRE #2 38 REV NDL BL (SUTURE) ×8
SUT FIBERWIRE #2 38 T-5 BLUE (SUTURE)
SUT MNCRL AB 3-0 PS2 18 (SUTURE) IMPLANT
SUT VIC AB 0 CT1 27 (SUTURE) ×2
SUT VIC AB 0 CT1 27XBRD ANBCTR (SUTURE) ×1 IMPLANT
SUT VIC AB 1 CT1 27 (SUTURE) ×6
SUT VIC AB 1 CT1 27XBRD ANBCTR (SUTURE) IMPLANT
SUT VIC AB 2-0 CT1 27 (SUTURE) ×2
SUT VIC AB 2-0 CT1 TAPERPNT 27 (SUTURE) ×1 IMPLANT
SUTURE FIBERWR #2 38 T-5 BLUE (SUTURE) IMPLANT
SUTURE FIBERWR#2 38 REV NDL BL (SUTURE) IMPLANT
SYR 30ML LL (SYRINGE) ×4 IMPLANT
SYR BULB IRRIGATION 50ML (SYRINGE) ×1 IMPLANT
TAPE FIBER 2MM 7IN #2 BLUE (SUTURE) ×2 IMPLANT
TOWEL OR 17X24 6PK STRL BLUE (TOWEL DISPOSABLE) ×2 IMPLANT
TOWEL OR 17X26 10 PK STRL BLUE (TOWEL DISPOSABLE) ×2 IMPLANT
TUBE CONNECTING 12X1/4 (SUCTIONS) ×2 IMPLANT
WAND HAND CNTRL MULTIVAC 90 (MISCELLANEOUS) IMPLANT
WATER STERILE IRR 1000ML POUR (IV SOLUTION) ×2 IMPLANT
YANKAUER SUCT BULB TIP NO VENT (SUCTIONS) ×4 IMPLANT

## 2014-10-08 NOTE — Anesthesia Postprocedure Evaluation (Signed)
  Anesthesia Post-op Note  Patient: Roberto Reid  Procedure(s) Performed: Procedure(s): ARTHROSCOPY KNEE (Right) REPAIR QUADRICEP TENDON (Right)  Patient Location: PACU  Anesthesia Type:General and GA combined with regional for post-op pain  Level of Consciousness: awake, alert , oriented and patient cooperative  Airway and Oxygen Therapy: Patient Spontanous Breathing  Post-op Pain: mild  Post-op Assessment: Post-op Vital signs reviewed, Patient's Cardiovascular Status Stable, Respiratory Function Stable, Patent Airway, No signs of Nausea or vomiting and Pain level controlled  Post-op Vital Signs: stable  Last Vitals:  Filed Vitals:   10/08/14 1325  BP: 157/78  Pulse: 89  Temp:   Resp: 12    Complications: No apparent anesthesia complications

## 2014-10-08 NOTE — Telephone Encounter (Signed)
Pt's wife called, patient fell last week and "tore his quad tendon" which was repaired today 10/08/14 by Dr. Ronnie Derby.   They have followup appointments with Dr. Ronnie Derby and have not been released as of yet.  Best number to call spouse if needed is [phone]/lt

## 2014-10-08 NOTE — Anesthesia Preprocedure Evaluation (Signed)
Anesthesia Evaluation  Patient identified by MRN, date of birth, ID band Patient awake    Reviewed: Allergy & Precautions, NPO status , Patient's Chart, lab work & pertinent test results  Airway Mallampati: I       Dental   Pulmonary former smoker,    Pulmonary exam normal       Cardiovascular hypertension, Rhythm:Regular Rate:Normal     Neuro/Psych    GI/Hepatic GERD-  ,  Endo/Other  diabetes, Type 2  Renal/GU Renal disease     Musculoskeletal  (+) Arthritis -,   Abdominal   Peds  Hematology   Anesthesia Other Findings   Reproductive/Obstetrics                             Anesthesia Physical Anesthesia Plan  ASA: III  Anesthesia Plan: General   Post-op Pain Management:    Induction: Intravenous  Airway Management Planned: Oral ETT  Additional Equipment:   Intra-op Plan:   Post-operative Plan: Extubation in OR  Informed Consent: I have reviewed the patients History and Physical, chart, labs and discussed the procedure including the risks, benefits and alternatives for the proposed anesthesia with the patient or authorized representative who has indicated his/her understanding and acceptance.     Plan Discussed with: CRNA, Anesthesiologist and Surgeon  Anesthesia Plan Comments:         Anesthesia Quick Evaluation

## 2014-10-08 NOTE — Discharge Instructions (Signed)
Diet: As you were doing prior to hospitalization   Activity:  Increase activity slowly as tolerated                  No lifting or driving for 6 weeks  Shower:  May shower without a dressing once there is no drainage from your wound.                 Do NOT wash over the wound.                 Dressing:  You may change your dressing on Wednesday                    Then change the dressing daily with sterile 4"x4"s gauze dressing                     And TED hose for knees.  Weight Bearing:  Weight bearing as tolerated as taught in physical therapy.  Use a                                walker or Crutches as instructed.  To prevent constipation: you may use a stool softener such as -               Colace ( over the counter) 100 mg by mouth twice a day                Drink plenty of fluids ( prune juice may be helpful) and high fiber foods                Miralax ( over the counter) for constipation as needed.    Precautions:  If you experience chest pain or shortness of breath - call 911 immediately               For transfer to the hospital emergency department!!               If you develop a fever greater that 101 F, purulent drainage from wound,                             increased redness or drainage from wound, or calf pain -- Call the office.  Follow- Up Appointment:  Please call for an appointment to be seen on 10/11/14                                               [facility] office:  [phone]            [address] [place], [place] [address]

## 2014-10-08 NOTE — Transfer of Care (Signed)
Immediate Anesthesia Transfer of Care Note  Patient: Roberto Reid  Procedure(s) Performed: Procedure(s): ARTHROSCOPY KNEE (Right) REPAIR QUADRICEP TENDON (Right)  Patient Location: PACU  Anesthesia Type:GA combined with regional for post-op pain  Level of Consciousness: awake and alert   Airway & Oxygen Therapy: Patient Spontanous Breathing and Patient connected to nasal cannula oxygen  Post-op Assessment: Report given to RN and Post -op Vital signs reviewed and stable  Post vital signs: Reviewed and stable  Last Vitals:  Filed Vitals:   10/08/14 1100  BP: 169/62  Pulse: 78  Temp:   Resp: 12    Complications: No apparent anesthesia complications

## 2014-10-08 NOTE — Anesthesia Procedure Notes (Addendum)
Anesthesia Regional Block:  Femoral nerve block  Pre-Anesthetic Checklist: ,, timeout performed, Correct Patient, Correct Site, Correct Laterality, Correct Procedure, Correct Position, site marked, Risks and benefits discussed,  Surgical consent,  Pre-op evaluation,  At surgeon's request and post-op pain management  Laterality: Right  Prep: chloraprep and alcohol swabs       Needles:  Injection technique: Single-shot  Needle Type: Stimulator Needle - 80          Additional Needles:  Procedures: Doppler guided Femoral nerve block Narrative:  Start time: 10/08/2014 10:50 AM End time: 10/08/2014 10:56 AM Injection made incrementally with aspirations every 5 mL.  Performed by: Personally  Anesthesiologist: Kate Sable  Additional Notes: Pt accepts procedure w/ risks. 20cc 0.5% Marcaine w/ epi. W/o difficulty or discomfort. GES   Procedure Name: Intubation Date/Time: 10/08/2014 11:34 AM Performed by: Suzy Bouchard Pre-anesthesia Checklist: Patient identified, Emergency Drugs available, Suction available, Patient being monitored and Timeout performed Patient Re-evaluated:Patient Re-evaluated prior to inductionOxygen Delivery Method: Circle system utilized Preoxygenation: Pre-oxygenation with 100% oxygen Intubation Type: IV induction Ventilation: Mask ventilation without difficulty Laryngoscope Size: Miller and 2 Grade View: Grade I Tube type: Oral Tube size: 7.5 mm Number of attempts: 1 Airway Equipment and Method: Stylet and LTA kit utilized Secured at: 22 cm Tube secured with: Tape Dental Injury: Teeth and Oropharynx as per pre-operative assessment

## 2014-10-08 NOTE — Progress Notes (Signed)
Skin tear noted to left elbow area cleaned with Ns and Telfa dressing applied with paper tape. Pt states he hit the wall as he was coming in and caused a skin tear.

## 2014-10-08 NOTE — H&P (Signed)
NAME: Roberto Reid MRN:   [id number] DOB:   10-15-1932     HISTORY AND PHYSICAL  CHIEF COMPLAINT:  Reid knee pai  HISTORY:   Roberto Reid a 81 y.o. male  C/O pain and weakness in the Reid knee [place].  Pain worsening.  Xray and MRI completed.  PAST MEDICAL HISTORY:   Past Medical History  Diagnosis Date  . GERD (gastroesophageal reflux disease)   . Hyperlipidemia   . Hearing loss   . Hypertension     DR Jeoffrey Massed  . Asthma     AS CHILD ONLY   . Cellulitis   . Kidney stone   . Arthritis   . Pneumonia     hx of PNA  . Diabetes mellitus     PAST SURGICAL HISTORY:   Past Surgical History  Procedure Laterality Date  . Repair torn knee cartilage    . Eye surgery      cataract, bilateral 11/10  . Elbow surgery      Reid  . Tonsillectomy    . Knee arthroplasty  12/14/2011    LEFT KNEE  . Total knee arthroplasty  12/14/2011    Procedure: TOTAL KNEE ARTHROPLASTY;  Surgeon: Rudean Haskell, MD;  Location: [facility];  Service: Orthopedics;  Laterality: Left;  . Foot surgery Reid 2010    Reid foot "attempted reconstruction" per pt    MEDICATIONS:   No prescriptions prior to admission    ALLERGIES:   Allergies  Allergen Reactions  . Percocet [Oxycodone-Acetaminophen]     Wild hallucinations. He states he has tolerated plain oxycodone in the past.   . Sulfa Antibiotics     REVIEW OF SYSTEMS:   Negative except HPI  FAMILY HISTORY:   Family History  Problem Relation Age of Onset  . Parkinson's disease Father     SOCIAL HISTORY:   reports that he quit smoking about 22 years ago. His smoking use included Cigarettes. He has never used smokeless tobacco. He reports that he drinks alcohol. He reports that he does not use illicit drugs.  PHYSICAL EXAM:  General appearance: alert, cooperative and no distress Resp: clear to auscultation bilaterally Cardio: regular rate and rhythm, S1, S2 normal, no murmur, click,  rub or gallop GI: soft, non-tender; bowel sounds normal; no masses,  no organomegaly Extremities: extremities normal, atraumatic, no cyanosis or edema and Homans sign is negative, no sign of DVT Pulses: 2+ and symmetric Skin: Skin color, texture, turgor normal. No rashes or lesions Neurologic: Alert and oriented X 3, normal strength and tone. Normal symmetric reflexes. Normal coordination and gait    LABORATORY STUDIES: No results for input(s): WBC, HGB, HCT, PLT in the last 72 hours.  No results for input(s): NA, K, CL, CO2, GLUCOSE, BUN, CREATININE, CALCIUM in the last 72 hours.  STUDIES/RESULTS:  Roberto Reid Wo Contrast  09/30/2014   CLINICAL DATA:  Reid knee pain after falling up stairs 2 weeks ago.  EXAM: MRI OF THE Reid KNEE WITHOUT CONTRAST  TECHNIQUE: Multiplanar, multisequence Roberto imaging of the knee was performed. No intravenous contrast was administered.  COMPARISON:  None.  FINDINGS: MENISCI  Medial meniscus: Oblique tear of the posterior horn extending to the inferior meniscal surface, with diminutive size of the posterior horn concerning for a flap type tear. I do not see a well-defined bucket-handle.  Lateral meniscus:  Unremarkable  LIGAMENTS  Cruciates:  Unremarkable  Collaterals:  Unremarkable  CARTILAGE  Patellofemoral: Mild chondral thinning particularly along the lateral patellar facet.  Medial: Severe degenerative chondral thinning, full-thickness along much of the articular surface.  Lateral: Mild chondral thinning with some areas of grade 2 and possibly grade 3 chondromalacia medially in the lateral tibial plateau.  Joint: Large knee effusion, communicating through the suprapatellar bursa with the subcutaneous tissues.  Popliteal Fossa:  Small Baker's cyst.  Extensor Mechanism: Total rupture of the quadriceps tendon at the patella. Lax patellar tendon. Upper portions of the patellar retinacula are torn.  Bones:  Moderate medial compartmental spurring.  IMPRESSION: 1. Total  rupture of the distal quadriceps tendon, allowing communication of the suprapatellar bursa with the subcutaneous tissues/prepatellar bursa. 2. Lax patellar tendon. 3. Oblique tear of the posterior horn medial meniscus, probably with flap component given the diminutive size of the posterior horn. I do not see a well-defined bucket-handle. 4. Variable degree of degenerative chondral thinning and chondromalacia. Chondral thinning most severe in the medial compartment.   Electronically Signed   By: Van Clines M.D.   On: 09/30/2014 18:24    ASSESSMENT:  Reid quad tear        Active Problems:   * No active hospital problems. *    PLAN:  Reid quad repair Sareen Randon 10/08/2014. 7:10 AM

## 2014-10-08 NOTE — Op Note (Signed)
Dictation Number [id number]

## 2014-10-09 ENCOUNTER — Encounter: Payer: Self-pay | Admitting: Internal Medicine

## 2014-10-09 ENCOUNTER — Other Ambulatory Visit: Payer: Self-pay | Admitting: *Deleted

## 2014-10-09 ENCOUNTER — Telehealth: Payer: Self-pay | Admitting: Internal Medicine

## 2014-10-09 MED ORDER — CLONAZEPAM 0.5 MG PO TABS: 0.5000 mg | ORAL_TABLET | Freq: Two times a day (BID) | ORAL | Status: DC | PRN

## 2014-10-09 NOTE — Telephone Encounter (Signed)
Please call and see why pt needs Klonopin. He has pain meds and sleep meds. At his age, may not need more medication

## 2014-10-09 NOTE — Op Note (Signed)
NAMEANISH, LAESSIG              ACCOUNT NO.:  [id number]  MEDICAL RECORD NO.:  [id number]  LOCATION:  [facility]                         FACILITY:  [facility]  PHYSICIAN:  Estill Bamberg. Ronnie Derby, M.D. DATE OF BIRTH:  06/01/1933  DATE OF PROCEDURE:  10/08/2014 DATE OF DISCHARGE:  10/08/2014                              OPERATIVE REPORT   SURGEON:  Estill Bamberg. Ronnie Derby, M.D.  ASSISTANTLowell Guitar. Mancel Bale, PA-C.  ANESTHESIA:  General.  PREOPERATIVE DIAGNOSIS:  Right knee quadriceps tendon rupture and medial meniscal tear.  POSTOPERATIVE DIAGNOSIS:  Right knee quadriceps tendon rupture and medial meniscal tear.  PROCEDURE:  Right knee arthroscopy with partial medial meniscectomy and open quadriceps tendon repair.  INDICATION FOR PROCEDURE:  The patient is an 81 year old, who injured himself falling while in [place].  The MRI scan upon return to see me showed a full-thickness quadriceps rupture as well as a medial meniscus tear.  Informed consent was obtained.  DESCRIPTION OF PROCEDURE:  The patient was laid supine, administered general anesthesia.  The right leg was prepped and draped in usual fashion.  Inferolateral and inferomedial portals were created with #11 blade, blunt trocar and cannula.  Diagnostic arthroscopy revealed really minimal chondromalacia in the knee, except in the medial compartment. In the medial compartment, there were kissing lesions, grade 3-4 chondromalacia as well as there was significant degenerative tear, which was cleaned up with straight basket forceps and great White shaver.  I then lavaged, removed the trocar and camera, and put 4-0 nylon in those portals.  I then exsanguinated the extremity and elevated the tourniquet to 350 mmHg.  I made a midline incision approximately 4 cm proximal to the proximal pole of the patella down to the anterior pole of the patella.  I created nice planes with peritenon.  Peritenon exposing the tendon.  I washed out the joint  and created a bony trough in the patella.  I then placed two Krackow stitches with #2 FiberWire in the quad tendon. I then oversewed the retinaculum medially and laterally with figure-of-eight #1 Vicryl and 2-0 FiberWire sutures. This afforded excellent repair.  I had absolutely no tension.  I then lavaged, closed with buried 0 and 2-0 Vicryl sutures.  Skin staples, dressed with Xeroform, dressing sponges, sterile Webril, and Ace wrap.  COMPLICATIONS:  None.  DRAINS:  None.          ______________________________ Estill Bamberg. Ronnie Derby, M.D.     SDL/MEDQ  D:  10/08/2014  T:  10/09/2014  Job:  [id number]

## 2014-10-09 NOTE — Telephone Encounter (Addendum)
Pt called and wants refill (from years ago he stated) on Clonopin .5 mg.  Please send to [facility]  Best number to call pt is [phone]. / lt Spoke with patient regarding recent accident. Accident occurred in [place]. He tripped coming down some marble steps and ruptured quadriceps tendon. Had surgery yesterday. Is having some difficulty resting at home with some anxiety. Call in  Klonopin 0.5 mg #60 one by mouth twice  all outside thatdaily with no refill.

## 2014-10-09 NOTE — Telephone Encounter (Signed)
Klonopin called in to patient pharmacy

## 2014-10-16 ENCOUNTER — Other Ambulatory Visit: Payer: Self-pay | Admitting: *Deleted

## 2014-10-16 MED ORDER — ATORVASTATIN CALCIUM 80 MG PO TABS: 80.0000 mg | ORAL_TABLET | Freq: Every day | ORAL | Status: DC

## 2014-10-16 NOTE — Telephone Encounter (Signed)
Atorvastatin refilled.  

## 2014-10-26 ENCOUNTER — Other Ambulatory Visit: Payer: Self-pay | Admitting: Internal Medicine

## 2014-10-27 ENCOUNTER — Encounter: Payer: Self-pay | Admitting: Internal Medicine

## 2014-10-27 NOTE — Patient Instructions (Signed)
Treated with IM Rocephin and Levaquin today for an infant infection of right third toe. Call if not improving in the next 24-48 hours or sooner if worse. Otherwise Continue same medications and return in 6 months.

## 2014-10-30 ENCOUNTER — Telehealth: Payer: Self-pay | Admitting: Internal Medicine

## 2014-10-30 NOTE — Telephone Encounter (Signed)
Please check strength....looks like patient was given both strength 5-325 and 10-325 during May.  ??    Please call patient when ready for pick up.

## 2014-10-30 NOTE — Telephone Encounter (Signed)
Need to inquire if orthopedist has given him other stronger pain med recently. Please call Dr. Ruel Favors office today. I am sure we increased the strength with recent fracture and other foot problems

## 2014-10-31 ENCOUNTER — Telehealth: Payer: Self-pay | Admitting: *Deleted

## 2014-10-31 MED ORDER — HYDROCODONE-ACETAMINOPHEN 10-325 MG PO TABS: 1.0000 | ORAL_TABLET | Freq: Three times a day (TID) | ORAL | Status: DC

## 2014-10-31 NOTE — Telephone Encounter (Signed)
Patients Norco refilled

## 2014-11-23 ENCOUNTER — Other Ambulatory Visit: Payer: Self-pay | Admitting: Internal Medicine

## 2014-11-23 ENCOUNTER — Encounter: Payer: Self-pay | Admitting: Physical Therapy

## 2014-11-23 ENCOUNTER — Ambulatory Visit: Payer: Medicare Other | Attending: Orthopedic Surgery | Admitting: Physical Therapy

## 2014-11-23 DIAGNOSIS — M25661 Stiffness of right knee, not elsewhere classified: Secondary | ICD-10-CM | POA: Diagnosis not present

## 2014-11-23 DIAGNOSIS — R262 Difficulty in walking, not elsewhere classified: Secondary | ICD-10-CM

## 2014-11-23 NOTE — Therapy (Signed)
[place] [address] [place], [place], [address] Phone: [phone]   Fax:  [phone]  Physical Therapy Evaluation  Patient Details  Name: Roberto Reid MRN: [id number] Date of Birth: March 06, 1933 Referring Provider:  Vickey Huger, MD  Encounter Date: 11/23/2014      PT End of Session - 11/23/14 0828    Visit Number 1   Date for PT Re-Evaluation 01/23/15   PT Start Time 0805   PT Stop Time [id number]   PT Time Calculation (min) 50 min   Activity Tolerance Patient tolerated treatment well      Past Medical History  Diagnosis Date  . GERD (gastroesophageal reflux disease)   . Hyperlipidemia   . Hearing loss   . Hypertension     DR Jeoffrey Massed  . Asthma     AS CHILD ONLY   . Cellulitis   . Kidney stone   . Arthritis   . Pneumonia     hx of PNA  . Diabetes mellitus     Past Surgical History  Procedure Laterality Date  . Repair torn knee cartilage    . Eye surgery      cataract, bilateral 11/10  . Elbow surgery      RIGHT  . Tonsillectomy    . Knee arthroplasty  12/14/2011    LEFT KNEE  . Total knee arthroplasty  12/14/2011    Procedure: TOTAL KNEE ARTHROPLASTY;  Surgeon: Rudean Haskell, MD;  Location: [facility];  Service: Orthopedics;  Laterality: Left;  . Foot surgery Right 2010    right foot "attempted reconstruction" per pt  . Knee arthroscopy Right 10/08/2014    Procedure: ARTHROSCOPY KNEE;  Surgeon: Vickey Huger, MD;  Location: [facility];  Service: Orthopedics;  Laterality: Right;  . Quadriceps tendon repair Right 10/08/2014    Procedure: REPAIR QUADRICEP TENDON;  Surgeon: Vickey Huger, MD;  Location: [facility];  Service: Orthopedics;  Laterality: Right;    There were no vitals filed for this visit.  Visit Diagnosis:  Difficulty walking - Plan: PT plan of care cert/re-cert  Knee stiffness, right - Plan: PT plan of care cert/re-cert      Subjective Assessment - 11/23/14 0809    Subjective patient  reports that he was in [place] in April and fell on some stairs and suffered a quad tendon rupture.  He underwent a quad tendon repair 6 weeks ago.   Limitations Lifting;Standing;Walking   How long can you walk comfortably? 5 minutes   Patient Stated Goals walk normally   Currently in Pain? Yes   Pain Score 2    Pain Location Knee   Pain Orientation Right   Pain Descriptors / Indicators Aching   Pain Onset More than a month ago   Pain Frequency Intermittent   Aggravating Factors  worse "low level" pain at night   Pain Relieving Factors rest   Effect of Pain on Daily Activities difficulty walking            [facility] PT Assessment - 11/23/14 0001    Assessment   Medical Diagnosis s/p right quad tendon repair   Onset Date/Surgical Date 10/08/14   Next MD Visit 3 weeks   Prior Therapy none   Precautions   Precaution Comments has brace on that is locked at 70 degrees right now and in a few weeks will go to 90 degrees   Required Braces or Orthoses Other Brace/Splint   Other Brace/Splint donjoy  brace   Restrictions   Weight Bearing Restrictions No   Balance Screen   Has the patient fallen in the past 6 months Yes   How many times? 1   Has the patient had a decrease in activity level because of a fear of falling?  No   Is the patient reluctant to leave their home because of a fear of falling?  No   Home [employer] residence   Prior Function   Level of Independence Independent   Leisure plays golf 1x /week, exercises 2x/week prior to this injury   AROM   Right Knee Extension 30   Right Knee Flexion 85   PROM   Overall PROM Comments PROM 5-85 degree, did not push as he is in brace that is locked at 70 degrees   Palpation   Palpation comment scar is well healed he is none tender, patella mobility is good, he has very poor quads and could not activate at less than 30 degrees of extension   Ambulation/Gait   Gait Comments ambulates with a SPC, slow with  brace on, antalgic on the right                   [facility] Adult PT Treatment/Exercise - 11/23/14 0001    Knee/Hip Exercises: Aerobic   Nustep Level 3 x 6 minutes   Knee/Hip Exercises: Supine   Quad Sets Right;3 sets;10 reps   Short Arc Quad Sets Right;3 sets;10 reps   Terminal Knee Extension Right;3 sets;10 reps                PT Education - 11/23/14 0827    Education provided Yes   Education Details HEP for quad sets and SAQ's   Person(s) Educated Patient   Methods Explanation;Demonstration;Handout   Comprehension Verbalized understanding;Returned demonstration;Verbal cues required;Tactile cues required          PT Short Term Goals - 11/23/14 0829    PT SHORT TERM GOAL #1   Title independent with initial HEP   Time 2   Period Weeks   Status New           PT Long Term Goals - 11/23/14 [id number]    PT LONG TERM GOAL #1   Title play 9 holes of golf   Time 8   Period Weeks   Status New   PT LONG TERM GOAL #2   Title increase AROM of the right knee to 5-115 degrees flexion   Time 8   Period Weeks   Status New   PT LONG TERM GOAL #3   Title able to do SLR without quad lag   Time 8   Period Weeks   Status New   PT LONG TERM GOAL #4   Title walk all distances without assistive device   Time 8   Period Weeks   Status New   PT LONG TERM GOAL #5   Title go up and down stairs reciprocally   Time 8   Period Weeks               Plan - 11/23/14 [id number]    Clinical Impression Statement Patient with right quad tendon repair on May 9th.  He is in brace locked at 70 degrees currently.  Is active and would like to return to golf and exercise   Pt will benefit from skilled therapeutic intervention in order to improve on the following deficits Abnormal gait;Decreased endurance;Decreased range of motion;Decreased mobility;Decreased  strength;Difficulty walking;Pain   Rehab Potential Good   PT Frequency 2x / week   PT Duration 8 weeks   PT  Treatment/Interventions Electrical Stimulation;Cryotherapy;Ultrasound;Gait training;Balance training;Therapeutic exercise;Functional mobility training;Stair training;Patient/family education;Manual techniques   PT Next Visit Plan add exercises, focus on getting quad back   Consulted and Agree with Plan of Care Patient          G-Codes - 2014-12-22 0844    Functional Assessment Tool Used FOTO   Functional Limitation Mobility: Walking and moving around   Mobility: Walking and Moving Around Current Status [phone]) At least 40 percent but less than 60 percent impaired, limited or restricted   Mobility: Walking and Moving Around Goal Status [phone]) At least 40 percent but less than 60 percent impaired, limited or restricted       Problem List Patient Active Problem List   Diagnosis Date Noted  . Atopic dermatitis 11/25/2012  . Preop cardiovascular exam 04/14/2011  . Hypertension 02/26/2011  . Hyperlipidemia 02/26/2011  . Diabetes mellitus 02/26/2011  . Osteoarthritis 02/26/2011  . GE reflux 02/26/2011  . Erectile dysfunction 02/26/2011    Sumner Boast., PT Dec 22, 2014, 8:47 AM  [place] [address] [place], [place], [address] Phone: [phone]   Fax:  [phone]

## 2014-11-26 ENCOUNTER — Ambulatory Visit: Payer: Medicare Other | Admitting: Physical Therapy

## 2014-11-26 DIAGNOSIS — R262 Difficulty in walking, not elsewhere classified: Secondary | ICD-10-CM | POA: Diagnosis not present

## 2014-11-26 DIAGNOSIS — M25661 Stiffness of right knee, not elsewhere classified: Secondary | ICD-10-CM

## 2014-11-26 NOTE — Therapy (Signed)
[place], [place], [address] Phone: [phone]   Fax:  [phone]  Physical Therapy Treatment  Patient Details  Name: Roberto Reid MRN: [id number] Date of Birth: 10-05-1932 Referring Provider:  Vickey Huger, MD  Encounter Date: 11/26/2014      PT End of Session - 11/26/14 1738    Visit Number 2   Date for PT Re-Evaluation 01/23/15   PT Start Time [id number]   PT Stop Time 1748   PT Time Calculation (min) 42 min   Activity Tolerance Patient tolerated treatment well      Past Medical History  Diagnosis Date  . GERD (gastroesophageal reflux disease)   . Hyperlipidemia   . Hearing loss   . Hypertension     DR Jeoffrey Massed  . Asthma     AS CHILD ONLY   . Cellulitis   . Kidney stone   . Arthritis   . Pneumonia     hx of PNA  . Diabetes mellitus     Past Surgical History  Procedure Laterality Date  . Repair torn knee cartilage    . Eye surgery      cataract, bilateral 11/10  . Elbow surgery      RIGHT  . Tonsillectomy    . Knee arthroplasty  12/14/2011    LEFT KNEE  . Total knee arthroplasty  12/14/2011    Procedure: TOTAL KNEE ARTHROPLASTY;  Surgeon: Rudean Haskell, MD;  Location: [facility];  Service: Orthopedics;  Laterality: Left;  . Foot surgery Right 2010    right foot "attempted reconstruction" per pt  . Knee arthroscopy Right 10/08/2014    Procedure: ARTHROSCOPY KNEE;  Surgeon: Vickey Huger, MD;  Location: [facility];  Service: Orthopedics;  Laterality: Right;  . Quadriceps tendon repair Right 10/08/2014    Procedure: REPAIR QUADRICEP TENDON;  Surgeon: Vickey Huger, MD;  Location: [facility];  Service: Orthopedics;  Laterality: Right;    There were no vitals filed for this visit.  Visit Diagnosis:  Difficulty walking  Knee stiffness, right      Subjective Assessment - 11/26/14 1712    Subjective Did some chippiing and putting over the weekend, noticed some swelling, no pain  though.  Pt states his brace is still set at 70 degrees and on Thursday they will move to 90 degrees   Pertinent History some limitations due to hesitancy, no pain limitations but strength limitations.   How long can you walk comfortably? 10 min   Patient Stated Goals walk normally, play golf   Currently in Pain? No/denies   Aggravating Factors  pain at night and the end of the day   Pain Relieving Factors rest                         [facility] Adult PT Treatment/Exercise - 11/26/14 0001    Knee/Hip Exercises: Aerobic   Recumbent Bike 6 min    Nustep Level 4 6 minutes   Knee/Hip Exercises: Supine   Quad Sets Right;3 sets;10 reps  wth [place] Stim 4/12 on/off cycle   Short Arc Quad Sets Right;3 sets;10 reps  with [place] Stim 4/12 on/off cycle   Terminal Knee Extension Right;3 sets;10 reps  ball behind knee                  PT Short Term Goals - 11/23/14 0829    PT SHORT TERM GOAL #1  Title independent with initial HEP   Time 2   Period Weeks   Status New           PT Long Term Goals - 11/23/14 [id number]    PT LONG TERM GOAL #1   Title play 9 holes of golf   Time 8   Period Weeks   Status New   PT LONG TERM GOAL #2   Title increase AROM of the right knee to 5-115 degrees flexion   Time 8   Period Weeks   Status New   PT LONG TERM GOAL #3   Title able to do SLR without quad lag   Time 8   Period Weeks   Status New   PT LONG TERM GOAL #4   Title walk all distances without assistive device   Time 8   Period Weeks   Status New   PT LONG TERM GOAL #5   Title go up and down stairs reciprocally   Time 8   Period Weeks               Plan - 11/26/14 1747    Clinical Impression Statement Patient with very poor quads and having difficulty getting them to fire, today used [place], verbal and tactile cues and some assist to work on [employer]   PT Next Visit Plan add exercises, focus on getting quad back   Consulted and Agree with Plan of Care  Patient        Problem List Patient Active Problem List   Diagnosis Date Noted  . Atopic dermatitis 11/25/2012  . Preop cardiovascular exam 04/14/2011  . Hypertension 02/26/2011  . Hyperlipidemia 02/26/2011  . Diabetes mellitus 02/26/2011  . Osteoarthritis 02/26/2011  . GE reflux 02/26/2011  . Erectile dysfunction 02/26/2011    Sumner Boast., PT 11/26/2014, 5:49 PM  [address] [place], [place], [address] Phone: [phone]   Fax:  [phone]

## 2014-11-29 ENCOUNTER — Encounter: Payer: Self-pay | Admitting: Physical Therapy

## 2014-11-29 ENCOUNTER — Telehealth: Payer: Self-pay | Admitting: *Deleted

## 2014-11-29 ENCOUNTER — Ambulatory Visit: Payer: Medicare Other | Admitting: Physical Therapy

## 2014-11-29 DIAGNOSIS — R262 Difficulty in walking, not elsewhere classified: Secondary | ICD-10-CM

## 2014-11-29 DIAGNOSIS — M25661 Stiffness of right knee, not elsewhere classified: Secondary | ICD-10-CM

## 2014-11-29 MED ORDER — HYDROCODONE-ACETAMINOPHEN 10-325 MG PO TABS: 1.0000 | ORAL_TABLET | Freq: Three times a day (TID) | ORAL | Status: DC

## 2014-11-29 NOTE — Telephone Encounter (Signed)
Patient would like a refill on his Hydrocodone

## 2014-11-29 NOTE — Therapy (Signed)
[place], [place], [address] Phone: [phone]   Fax:  [phone]  Physical Therapy Treatment  Patient Details  Name: Roberto Reid MRN: [id number] Date of Birth: [birth year] Referring Provider:  Vickey Huger, MD  Encounter Date: 11/29/2014      PT End of Session - 11/29/14 1000    Visit Number 3   Date for PT Re-Evaluation 01/23/15   PT Start Time 0903   PT Stop Time 0942   PT Time Calculation (min) 39 min   Activity Tolerance Patient tolerated treatment well   Behavior During Therapy [facility] for tasks assessed/performed      Past Medical History  Diagnosis Date  . GERD (gastroesophageal reflux disease)   . Hyperlipidemia   . Hearing loss   . Hypertension     DR Jeoffrey Massed  . Asthma     AS CHILD ONLY   . Cellulitis   . Kidney stone   . Arthritis   . Pneumonia     hx of PNA  . Diabetes mellitus     Past Surgical History  Procedure Laterality Date  . Repair torn knee cartilage    . Eye surgery      cataract, bilateral 11/10  . Elbow surgery      RIGHT  . Tonsillectomy    . Knee arthroplasty  12/14/2011    LEFT KNEE  . Total knee arthroplasty  12/14/2011    Procedure: TOTAL KNEE ARTHROPLASTY;  Surgeon: Rudean Haskell, MD;  Location: [facility];  Service: Orthopedics;  Laterality: Left;  . Foot surgery Right 2010    right foot "attempted reconstruction" per pt  . Knee arthroscopy Right 10/08/2014    Procedure: ARTHROSCOPY KNEE;  Surgeon: Vickey Huger, MD;  Location: [facility];  Service: Orthopedics;  Laterality: Right;  . Quadriceps tendon repair Right 10/08/2014    Procedure: REPAIR QUADRICEP TENDON;  Surgeon: Vickey Huger, MD;  Location: [facility];  Service: Orthopedics;  Laterality: Right;    There were no vitals filed for this visit.  Visit Diagnosis:  Knee stiffness, right  Difficulty walking      Subjective Assessment - 11/29/14 0904    Subjective Fell good after the last  treatment, pt states he moved his brace to 90 degrees today.     Pertinent History some limitations due to hesitancy, no pain limitations but strength limitations.   How long can you walk comfortably? 10 min   Patient Stated Goals walk normally, play golf   Currently in Pain? No/denies                         [facility] Adult PT Treatment/Exercise - 11/29/14 0001    Ambulation/Gait   Gait Comments slow amb today entering therapy area, bent over at waist   Knee/Hip Exercises: Aerobic   Recumbent Bike 6 min    Nustep Level 6 6 minutes   Knee/Hip Exercises: Supine   Quad Sets Right;3 sets;10 reps   Short Arc Quad Sets Right;2 sets;10 reps  stopped at 2nd set due to fatigue   Terminal Knee Extension Right;3 sets;10 reps                  PT Short Term Goals - 11/29/14 1010    PT SHORT TERM GOAL #1   Title independent with initial HEP   Status On-going  PT Long Term Goals - 11/23/14 [id number]    PT LONG TERM GOAL #1   Title play 9 holes of golf   Time 8   Period Weeks   Status New   PT LONG TERM GOAL #2   Title increase AROM of the right knee to 5-115 degrees flexion   Time 8   Period Weeks   Status New   PT LONG TERM GOAL #3   Title able to do SLR without quad lag   Time 8   Period Weeks   Status New   PT LONG TERM GOAL #4   Title walk all distances without assistive device   Time 8   Period Weeks   Status New   PT LONG TERM GOAL #5   Title go up and down stairs reciprocally   Time 8   Period Weeks               Plan - 11/29/14 1002    Clinical Impression Statement Pt continues to have poor quad activation, continued with [place] stim for quad sets and SAQ's.  Verbal and tactile cues required for max contraction as well as assistance for SAQ's.  Pt seemed fatigued today and an increase in assistance was required for set 2 of SAQ's so we stopped there.    Pt will benefit from skilled therapeutic intervention in order to improve  on the following deficits Abnormal gait;Decreased endurance;Decreased range of motion;Decreased mobility;Decreased strength;Difficulty walking;Pain   Rehab Potential Good   PT Frequency 2x / week   PT Duration 8 weeks   PT Treatment/Interventions Electrical Stimulation;Cryotherapy;Ultrasound;Gait training;Balance training;Therapeutic exercise;Functional mobility training;Stair training;Patient/family education;Manual techniques   PT Next Visit Plan add closed chain exercises and continue to work on quad activation   Consulted and Agree with Plan of Care Patient        Problem List Patient Active Problem List   Diagnosis Date Noted  . Atopic dermatitis 11/25/2012  . Preop cardiovascular exam 04/14/2011  . Hypertension 02/26/2011  . Hyperlipidemia 02/26/2011  . Diabetes mellitus 02/26/2011  . Osteoarthritis 02/26/2011  . GE reflux 02/26/2011  . Erectile dysfunction 02/26/2011    [name], PTA 11/29/2014, 10:12 AM  [place] [address] [place], [place], [address] Phone: [phone]   Fax:  [phone]

## 2014-11-29 NOTE — Telephone Encounter (Signed)
Refill once 

## 2014-11-29 NOTE — Telephone Encounter (Signed)
Hydrocodone refilled.  

## 2014-12-05 ENCOUNTER — Ambulatory Visit: Payer: Medicare Other | Attending: Orthopedic Surgery | Admitting: Rehabilitation

## 2014-12-05 DIAGNOSIS — M25661 Stiffness of right knee, not elsewhere classified: Secondary | ICD-10-CM | POA: Diagnosis present

## 2014-12-05 DIAGNOSIS — R262 Difficulty in walking, not elsewhere classified: Secondary | ICD-10-CM | POA: Diagnosis not present

## 2014-12-05 DIAGNOSIS — Z7409 Other reduced mobility: Secondary | ICD-10-CM | POA: Diagnosis present

## 2014-12-05 DIAGNOSIS — R29898 Other symptoms and signs involving the musculoskeletal system: Secondary | ICD-10-CM | POA: Insufficient documentation

## 2014-12-05 NOTE — Therapy (Signed)
[place], [place], [address] Phone: [phone]   Fax:  [phone]  Physical Therapy Treatment  Patient Details  Name: Roberto Reid MRN: [id number] Date of Birth: 1932-08-15 Referring Provider:  Vickey Huger, MD  Encounter Date: 12/05/2014      PT End of Session - 12/05/14 0914    Visit Number 4   PT Start Time 0900   PT Stop Time 0945   PT Time Calculation (min) 45 min      Past Medical History  Diagnosis Date  . GERD (gastroesophageal reflux disease)   . Hyperlipidemia   . Hearing loss   . Hypertension     DR Jeoffrey Massed  . Asthma     AS CHILD ONLY   . Cellulitis   . Kidney stone   . Arthritis   . Pneumonia     hx of PNA  . Diabetes mellitus     Past Surgical History  Procedure Laterality Date  . Repair torn knee cartilage    . Eye surgery      cataract, bilateral 11/10  . Elbow surgery      RIGHT  . Tonsillectomy    . Knee arthroplasty  12/14/2011    LEFT KNEE  . Total knee arthroplasty  12/14/2011    Procedure: TOTAL KNEE ARTHROPLASTY;  Surgeon: Rudean Haskell, MD;  Location: [facility];  Service: Orthopedics;  Laterality: Left;  . Foot surgery Right 2010    right foot "attempted reconstruction" per pt  . Knee arthroscopy Right 10/08/2014    Procedure: ARTHROSCOPY KNEE;  Surgeon: Vickey Huger, MD;  Location: [facility];  Service: Orthopedics;  Laterality: Right;  . Quadriceps tendon repair Right 10/08/2014    Procedure: REPAIR QUADRICEP TENDON;  Surgeon: Vickey Huger, MD;  Location: [facility];  Service: Orthopedics;  Laterality: Right;    There were no vitals filed for this visit.  Visit Diagnosis:  Difficulty walking  Weakness of right lower extremity      Subjective Assessment - 12/05/14 1237    Subjective Pt. reports no c/o.   Tired of wearing long leg brace.    States sees MD next Th and expects the brace to be d/c   Currently in Pain? No/denies   Pain Score 0-No  pain   Aggravating Factors  still a small amount of pain end of day 1-2/10   Effect of Pain on Daily Activities still with difficulty walking.    cane dependent.    not steady without cane, and L knee is not stable                         [facility] Adult PT Treatment/Exercise - 12/05/14 0001    Exercises   Exercises Knee/Hip   Knee/Hip Exercises: Aerobic   Recumbent Bike 6'   Nustep 6' L6   Knee/Hip Exercises: Machines for Strengthening   Cybex Leg Press 40# BLE x 30   Other Machine 5# 3-way hip x 20 ea   Knee/Hip Exercises: Standing   Other Standing Knee Exercises resisted gait forward only with SPC   Knee/Hip Exercises: Supine   Quad Sets Right   Quad Sets Limitations x30   Short Arc Quad Sets Right   Short Arc Quad Sets Limitations x 30   Modalities   Modalities Retail buyer Location R knee  R knee  Electrical Stimulation Parameters VMS 2 channel 10 on/60 off x 15' with QS.   Intensity of 50-60 mA both   Electrical Stimulation Goals Neuromuscular facilitation                  PT Short Term Goals - 11/29/14 1010    PT SHORT TERM GOAL #1   Title independent with initial HEP   Status On-going           PT Long Term Goals - 11/23/14 [id number]    PT LONG TERM GOAL #1   Title play 9 holes of golf   Time 8   Period Weeks   Status New   PT LONG TERM GOAL #2   Title increase AROM of the right knee to 5-115 degrees flexion   Time 8   Period Weeks   Status New   PT LONG TERM GOAL #3   Title able to do SLR without quad lag   Time 8   Period Weeks   Status New   PT LONG TERM GOAL #4   Title walk all distances without assistive device   Time 8   Period Weeks   Status New   PT LONG TERM GOAL #5   Title go up and down stairs reciprocally   Time 8   Period Weeks               Problem List Patient Active Problem List   Diagnosis Date Noted  . Atopic dermatitis 11/25/2012   . Preop cardiovascular exam 04/14/2011  . Hypertension 02/26/2011  . Hyperlipidemia 02/26/2011  . Diabetes mellitus 02/26/2011  . Osteoarthritis 02/26/2011  . GE reflux 02/26/2011  . Erectile dysfunction 02/26/2011    Volney American, PT 12/05/2014, 12:43 PM  [place], [place], [address] Phone: [phone]   Fax:  [phone]

## 2014-12-07 ENCOUNTER — Ambulatory Visit: Payer: Medicare Other | Admitting: Rehabilitation

## 2014-12-07 DIAGNOSIS — R29898 Other symptoms and signs involving the musculoskeletal system: Secondary | ICD-10-CM

## 2014-12-07 DIAGNOSIS — R262 Difficulty in walking, not elsewhere classified: Secondary | ICD-10-CM | POA: Diagnosis not present

## 2014-12-07 NOTE — Therapy (Signed)
[place], [place], [address] Phone: [phone]   Fax:  [phone]  Physical Therapy Treatment  Patient Details  Name: Roberto Reid MRN: [id number] Date of Birth: [birth year] Referring Provider:  Vickey Huger, MD  Encounter Date: 12/07/2014      PT End of Session - 12/07/14 0929    Visit Number 5   PT Start Time 0850   PT Stop Time 0940   PT Time Calculation (min) 50 min      Past Medical History  Diagnosis Date  . GERD (gastroesophageal reflux disease)   . Hyperlipidemia   . Hearing loss   . Hypertension     DR Jeoffrey Massed  . Asthma     AS CHILD ONLY   . Cellulitis   . Kidney stone   . Arthritis   . Pneumonia     hx of PNA  . Diabetes mellitus     Past Surgical History  Procedure Laterality Date  . Repair torn knee cartilage    . Eye surgery      cataract, bilateral 11/10  . Elbow surgery      RIGHT  . Tonsillectomy    . Knee arthroplasty  12/14/2011    LEFT KNEE  . Total knee arthroplasty  12/14/2011    Procedure: TOTAL KNEE ARTHROPLASTY;  Surgeon: Rudean Haskell, MD;  Location: [facility];  Service: Orthopedics;  Laterality: Left;  . Foot surgery Right 2010    right foot "attempted reconstruction" per pt  . Knee arthroscopy Right 10/08/2014    Procedure: ARTHROSCOPY KNEE;  Surgeon: Vickey Huger, MD;  Location: [facility];  Service: Orthopedics;  Laterality: Right;  . Quadriceps tendon repair Right 10/08/2014    Procedure: REPAIR QUADRICEP TENDON;  Surgeon: Vickey Huger, MD;  Location: [facility];  Service: Orthopedics;  Laterality: Right;    There were no vitals filed for this visit.  Visit Diagnosis:  Weakness of right lower extremity      Subjective Assessment - 12/07/14 0907    Subjective Pt. reports fell yesterday getting off lawn mower after he was here last for PT (2 day ago).  Landed on R shoulder per his report and lacerated his R forearm/elbow which has a bandage in  place.    However, he also lacerated /split the distal end of his R knee incision which has no bandage in place today.    There is a little drainage serosangiunous dripping out on his arrival so this is covered with a bandage.    Patient is advised to keep clean, dry, covered.   He states he did have his long leg brace in place when he fell and states he feels uninjured.    He can flex/ext the R knee without difficulty and is ambulating at his baseline.     He does come to clinic today without his brace, and the importance of the brace is reiterated to him.   Called Dr Ruel Favors office an L/M regarding the above infor and requested the f/u with patient about what dressing to use  and if he needs to come in.    Currently in Pain? Yes   Pain Score 0-No pain   Pain Location Knee   Pain Orientation Right                         [facility] Adult PT Treatment/Exercise - 12/07/14 0001  Knee/Hip Exercises: Aerobic   Recumbent Bike 6'   Nustep 6' L2   Knee/Hip Exercises: Machines for Strengthening   Cybex Leg Press 40# BLE   Knee/Hip Exercises: Standing   Other Standing Knee Exercises resisted gait forward only x 4 laps   Other Standing Knee Exercises red TB 3 way hip x 30   Knee/Hip Exercises: Seated   Long Arc Quad 3 sets   Long Arc Quad Limitations x10    Knee/Hip Exercises: Supine   Careers information officer Limitations 30   Short Arc Quad Sets Right   Short Arc Quad Sets Limitations 30   Terminal Knee Extension Right   Terminal Knee Extension Limitations Red TB x 30   Modalities   Modalities Retail buyer Location R knee   Electrical Stimulation Parameters Russian 10/50 2 channel x 15' with long arc quads   Electrical Stimulation Goals Neuromuscular facilitation                PT Education - 12/07/14 [phone]    Education provided Yes   Education Details Keep wound covere, clean, dry   Person(s) Educated  Patient   Methods Explanation   Comprehension Verbalized understanding          PT Short Term Goals - 11/29/14 1010    PT SHORT TERM GOAL #1   Title independent with initial HEP   Status On-going           PT Long Term Goals - 11/23/14 0842    PT LONG TERM GOAL #1   Title play 9 holes of golf   Time 8   Period Weeks   Status New   PT LONG TERM GOAL #2   Title increase AROM of the right knee to 5-115 degrees flexion   Time 8   Period Weeks   Status New   PT LONG TERM GOAL #3   Title able to do SLR without quad lag   Time 8   Period Weeks   Status New   PT LONG TERM GOAL #4   Title walk all distances without assistive device   Time 8   Period Weeks   Status New   PT LONG TERM GOAL #5   Title go up and down stairs reciprocally   Time 8   Period Weeks               Plan - 12/07/14 1208    Clinical Impression Statement Quad set is much improved.    However, he still has 20-30 degree quad lag with SLR, cannot fully extend r knee antigravity, 2/5 knee extensor strength.   Continued [place] estim but with LAQ today.   PT Next Visit Plan Hold on closed chain until patient has better strength, continue to work toward 3/5 strength open chain        Problem List Patient Active Problem List   Diagnosis Date Noted  . Atopic dermatitis 11/25/2012  . Preop cardiovascular exam 04/14/2011  . Hypertension 02/26/2011  . Hyperlipidemia 02/26/2011  . Diabetes mellitus 02/26/2011  . Osteoarthritis 02/26/2011  . GE reflux 02/26/2011  . Erectile dysfunction 02/26/2011    Volney American, PT 12/07/2014, 12:10 PM  [place], [place], [address] Phone: [phone]   Fax:  [phone]

## 2014-12-11 ENCOUNTER — Ambulatory Visit: Payer: Medicare Other | Admitting: Physical Therapy

## 2014-12-11 DIAGNOSIS — R29898 Other symptoms and signs involving the musculoskeletal system: Secondary | ICD-10-CM

## 2014-12-11 DIAGNOSIS — R262 Difficulty in walking, not elsewhere classified: Secondary | ICD-10-CM | POA: Diagnosis not present

## 2014-12-11 DIAGNOSIS — M25661 Stiffness of right knee, not elsewhere classified: Secondary | ICD-10-CM

## 2014-12-11 NOTE — Therapy (Signed)
[place], [place], [address] Phone: [phone]   Fax:  [phone]  Physical Therapy Treatment  Patient Details  Name: Roberto Reid MRN: [id number] Date of Birth: Nov 11, 1932 Referring Provider:  Vickey Huger, MD  Encounter Date: 12/11/2014      PT End of Session - 12/11/14 1004    Visit Number 6   Date for PT Re-Evaluation 01/23/15   PT Start Time 0852   PT Stop Time 0955   PT Time Calculation (min) 63 min   Activity Tolerance Patient tolerated treatment well   Behavior During Therapy [facility] for tasks assessed/performed      Past Medical History  Diagnosis Date  . GERD (gastroesophageal reflux disease)   . Hyperlipidemia   . Hearing loss   . Hypertension     DR Jeoffrey Massed  . Asthma     AS CHILD ONLY   . Cellulitis   . Kidney stone   . Arthritis   . Pneumonia     hx of PNA  . Diabetes mellitus     Past Surgical History  Procedure Laterality Date  . Repair torn knee cartilage    . Eye surgery      cataract, bilateral 11/10  . Elbow surgery      RIGHT  . Tonsillectomy    . Knee arthroplasty  12/14/2011    LEFT KNEE  . Total knee arthroplasty  12/14/2011    Procedure: TOTAL KNEE ARTHROPLASTY;  Surgeon: Rudean Haskell, MD;  Location: [facility];  Service: Orthopedics;  Laterality: Left;  . Foot surgery Right 2010    right foot "attempted reconstruction" per pt  . Knee arthroscopy Right 10/08/2014    Procedure: ARTHROSCOPY KNEE;  Surgeon: Vickey Huger, MD;  Location: [facility];  Service: Orthopedics;  Laterality: Right;  . Quadriceps tendon repair Right 10/08/2014    Procedure: REPAIR QUADRICEP TENDON;  Surgeon: Vickey Huger, MD;  Location: [facility];  Service: Orthopedics;  Laterality: Right;    There were no vitals filed for this visit.  Visit Diagnosis:  Weakness of right lower extremity  Difficulty walking  Knee stiffness, right      Subjective Assessment - 12/11/14 0854    Subjective Pt states he is doing ok.  Denies any falls over the weekend.     Currently in Pain? No/denies                         [facility] Adult PT Treatment/Exercise - 12/11/14 0001    Knee/Hip Exercises: Aerobic   Recumbent Bike 6'   Nustep 6' L2   Knee/Hip Exercises: Machines for Strengthening   Cybex Leg Press 40# BLE  2 sets, 15 reps    Knee/Hip Exercises: Standing   Other Standing Knee Exercises red TB 3 way hip x 30  standing on Left leg   Knee/Hip Exercises: Seated   Long Arc Quad 3 sets;15 reps   Knee/Hip Exercises: Supine   Quad Sets Right   Quad Sets Limitations 30  fatigue   Short Arc Quad Sets Right   Short Arc Quad Sets Limitations 2 sets, 12 reps  to fatigue   Terminal Knee Extension Right   Terminal Knee Extension Limitations Red TB x 30   Modalities   Modalities Retail buyer Location R knee   Electrical Stimulation Parameters russian, 1 channel, LAQ 15 min  Electrical Stimulation Goals Neuromuscular facilitation                  PT Short Term Goals - 11/29/14 1010    PT SHORT TERM GOAL #1   Title independent with initial HEP   Status On-going           PT Long Term Goals - 11/23/14 0842    PT LONG TERM GOAL #1   Title play 9 holes of golf   Time 8   Period Weeks   Status New   PT LONG TERM GOAL #2   Title increase AROM of the right knee to 5-115 degrees flexion   Time 8   Period Weeks   Status New   PT LONG TERM GOAL #3   Title able to do SLR without quad lag   Time 8   Period Weeks   Status New   PT LONG TERM GOAL #4   Title walk all distances without assistive device   Time 8   Period Weeks   Status New   PT LONG TERM GOAL #5   Title go up and down stairs reciprocally   Time 8   Period Weeks               Plan - 12/11/14 1005    Clinical Impression Statement Pt continues to improve with quad activtion. However, pt needs verbal and  tactile cues on form and mechanics with 3-way hip.  Continue russian estim with LAQ but considering change to VMS as he is limited by pain before strong contraction.   Pt will benefit from skilled therapeutic intervention in order to improve on the following deficits Abnormal gait;Decreased endurance;Decreased range of motion;Decreased mobility;Decreased strength;Difficulty walking;Pain   Rehab Potential Good   PT Frequency 2x / week   PT Duration 8 weeks   PT Treatment/Interventions Electrical Stimulation;Cryotherapy;Ultrasound;Gait training;Balance training;Therapeutic exercise;Functional mobility training;Stair training;Patient/family education;Manual techniques   PT Next Visit Plan Continue to work on quad strength and decreasing quad lag.  Resisted gait forward/backward/side to side   Consulted and Agree with Plan of Care Patient        Problem List Patient Active Problem List   Diagnosis Date Noted  . Atopic dermatitis 11/25/2012  . Preop cardiovascular exam 04/14/2011  . Hypertension 02/26/2011  . Hyperlipidemia 02/26/2011  . Diabetes mellitus 02/26/2011  . Osteoarthritis 02/26/2011  . GE reflux 02/26/2011  . Erectile dysfunction 02/26/2011    PAYSEUR,ANGIE PTA 12/11/2014, 10:29 AM  [place], [place], [address] Phone: [phone]   Fax:  [phone]

## 2014-12-13 ENCOUNTER — Ambulatory Visit: Payer: Medicare Other | Admitting: Physical Therapy

## 2014-12-13 ENCOUNTER — Encounter: Payer: Self-pay | Admitting: Physical Therapy

## 2014-12-13 DIAGNOSIS — Z7409 Other reduced mobility: Secondary | ICD-10-CM

## 2014-12-13 DIAGNOSIS — R262 Difficulty in walking, not elsewhere classified: Secondary | ICD-10-CM

## 2014-12-13 DIAGNOSIS — M25661 Stiffness of right knee, not elsewhere classified: Secondary | ICD-10-CM

## 2014-12-13 DIAGNOSIS — R29898 Other symptoms and signs involving the musculoskeletal system: Secondary | ICD-10-CM

## 2014-12-13 NOTE — Therapy (Signed)
[place], [place], [address] Phone: [phone]   Fax:  [phone]  Physical Therapy Treatment  Patient Details  Name: Roberto Reid MRN: [id number] Date of Birth: 14-Nov-1932 Referring Provider:  Vickey Huger, MD  Encounter Date: 12/13/2014      PT End of Session - 12/13/14 0949    Visit Number 7   Date for PT Re-Evaluation 01/23/15   PT Start Time 0853   PT Stop Time 0940   PT Time Calculation (min) 47 min   Equipment Utilized During Treatment Other (comment)  assistance required with resisted gait   Activity Tolerance Patient tolerated treatment well   Behavior During Therapy [facility] for tasks assessed/performed       Past Surgical History  Procedure Laterality Date  . Repair torn knee cartilage    . Eye surgery      cataract, bilateral 11/10  . Elbow surgery      RIGHT  . Tonsillectomy    . Knee arthroplasty  12/14/2011    LEFT KNEE  . Total knee arthroplasty  12/14/2011    Procedure: TOTAL KNEE ARTHROPLASTY;  Surgeon: Rudean Haskell, MD;  Location: [facility];  Service: Orthopedics;  Laterality: Left;  . Foot surgery Right 2010    right foot "attempted reconstruction" per pt  . Knee arthroscopy Right 10/08/2014    Procedure: ARTHROSCOPY KNEE;  Surgeon: Vickey Huger, MD;  Location: [facility];  Service: Orthopedics;  Laterality: Right;  . Quadriceps tendon repair Right 10/08/2014    Procedure: REPAIR QUADRICEP TENDON;  Surgeon: Vickey Huger, MD;  Location: [facility];  Service: Orthopedics;  Laterality: Right;     Visit Diagnosis:  Weakness of right lower extremity  Difficulty walking  Knee stiffness, right  Decreased mobility and endurance      Subjective Assessment - 12/13/14 0851    Subjective Pt reports no new changes    Currently in Pain? No/denies   Pain Score 0-No pain   Pain Location Knee   Pain Orientation Right        Patient has AROM of the knee at the edge of bed to 30  degrees from full extension, Flexion is > 90 degrees without pain.  We have been using estim to activate the VMO and quad for QS and SAQ's, he is able to achieve a good contraction on own but it fades quickly, unable to lift with a long lever arm.               [facility] Adult PT Treatment/Exercise - 12/13/14 0001    Knee/Hip Exercises: Aerobic   Recumbent Bike 6'   Nustep 4' L5   Knee/Hip Exercises: Standing   Gait Training resisted gait forward/backward/side to side 41f x 2  supervision on forward/sidestep, stand by assist w/backward   Modalities   Modalities Electrical Stimulation   Electrical Stimulation   Electrical Stimulation Location R knee   Electrical Stimulation Parameters combo focusing on motor point of VMO, 1:5 ratio, SAQ   Electrical Stimulation Goals Neuromuscular facilitation                  PT Short Term Goals - 11/29/14 1010    PT SHORT TERM GOAL #1   Title independent with initial HEP   Status On-going           PT Long Term Goals - 12/13/14 1025    PT LONG TERM GOAL #1   Title play 9  holes of golf   Status Partially Met   PT LONG TERM GOAL #2   Title increase AROM of the right knee to 5-115 degrees flexion   Status On-going   PT LONG TERM GOAL #3   Title able to do SLR without quad lag   Status On-going   PT LONG TERM GOAL #4   Title walk all distances without assistive device   Status Partially Met               Plan - 12/13/14 0950    Clinical Impression Statement Pt tolerated treatment well, states he continues to have some stability and balance issues with the knee.  He has a Dr. appt later today.  Pt continues to display an extension lag with LAQ but improved from 35 to 27 degrees of lag from beginning to end of treatment.  Pt  displays lack of endurance with resisted gait in side step and backwards.  Assistance needed for safety on backward walking due to R knee fatigue.  Combination stimulation to the R VMO elicited  good contraction when instructing patient on proper mechanics.    Pt will benefit from skilled therapeutic intervention in order to improve on the following deficits Abnormal gait;Decreased endurance;Decreased range of motion;Decreased mobility;Decreased strength;Difficulty walking;Pain   Rehab Potential Good   PT Frequency 2x / week   PT Duration 8 weeks   PT Treatment/Interventions Electrical Stimulation;Cryotherapy;Ultrasound;Gait training;Balance training;Therapeutic exercise;Functional mobility training;Stair training;Patient/family education;Manual techniques   PT Next Visit Plan Continue to work on quad strength and decreasing quad lag.  Resisted gait forward/backward/side to side with 2nd assist on resisted gait.  Start balance training and stairs.   Consulted and Agree with Plan of Care Patient          Sumner Boast., PT 12/13/2014, 10:28 AM  [place], [place], [address] Phone: [phone]   Fax:  [phone]

## 2014-12-18 ENCOUNTER — Ambulatory Visit: Payer: Medicare Other | Admitting: Physical Therapy

## 2014-12-18 ENCOUNTER — Encounter: Payer: Self-pay | Admitting: Physical Therapy

## 2014-12-18 DIAGNOSIS — R262 Difficulty in walking, not elsewhere classified: Secondary | ICD-10-CM

## 2014-12-18 DIAGNOSIS — R29898 Other symptoms and signs involving the musculoskeletal system: Secondary | ICD-10-CM

## 2014-12-18 DIAGNOSIS — M25661 Stiffness of right knee, not elsewhere classified: Secondary | ICD-10-CM

## 2014-12-18 NOTE — Therapy (Signed)
[place], [place], [address] Phone: [phone]   Fax:  [phone]  Physical Therapy Treatment  Patient Details  Name: Roberto Reid MRN: [id number] Date of Birth: [birth year] Referring Provider:  Vickey Huger, MD  Encounter Date: 12/18/2014      PT End of Session - 12/18/14 0926    Visit Number 8   Date for PT Re-Evaluation 01/23/15   PT Start Time 0851   PT Stop Time 0930   PT Time Calculation (min) 39 min      Past Medical History  Diagnosis Date  . GERD (gastroesophageal reflux disease)   . Hyperlipidemia   . Hearing loss   . Hypertension     DR Jeoffrey Massed  . Asthma     AS CHILD ONLY   . Cellulitis   . Kidney stone   . Arthritis   . Pneumonia     hx of PNA  . Diabetes mellitus     Past Surgical History  Procedure Laterality Date  . Repair torn knee cartilage    . Eye surgery      cataract, bilateral 11/10  . Elbow surgery      RIGHT  . Tonsillectomy    . Knee arthroplasty  12/14/2011    LEFT KNEE  . Total knee arthroplasty  12/14/2011    Procedure: TOTAL KNEE ARTHROPLASTY;  Surgeon: Rudean Haskell, MD;  Location: [facility];  Service: Orthopedics;  Laterality: Left;  . Foot surgery Right 2010    right foot "attempted reconstruction" per pt  . Knee arthroscopy Right 10/08/2014    Procedure: ARTHROSCOPY KNEE;  Surgeon: Vickey Huger, MD;  Location: [facility];  Service: Orthopedics;  Laterality: Right;  . Quadriceps tendon repair Right 10/08/2014    Procedure: REPAIR QUADRICEP TENDON;  Surgeon: Vickey Huger, MD;  Location: [facility];  Service: Orthopedics;  Laterality: Right;    There were no vitals filed for this visit.  Visit Diagnosis:  Difficulty walking  Knee stiffness, right  Weakness of right lower extremity      Subjective Assessment - 12/18/14 0851    Subjective Pt enters clinic with [facility] without brace. Pt reports no new changes, Pt stated that he has the wear the  brace for one more month    Pertinent History some limitations due to hesitancy, no pain limitations but strength limitations.   Limitations Lifting;Standing;Walking   How long can you walk comfortably? 10 min   Currently in Pain? No/denies   Pain Score 0-No pain                         [facility] Adult PT Treatment/Exercise - 12/18/14 0001    Knee/Hip Exercises: Aerobic   Recumbent Bike 6'   Knee/Hip Exercises: Machines for Strengthening   Cybex Leg Press 30# RLE 5 reps 4 sets    Knee/Hip Exercises: Standing   Gait Training resisted gait forward/backward/side to side 84f x 2   Knee/Hip Exercises: Seated   Long Arc Quad AROM;AAROM;Strengthening;Right;2 sets;10 reps  AAROM at terminal range    Sit to Sand 2 sets;10 reps;without UE support  yellow weighted ball    Knee/Hip Exercises: Supine   Quad Sets Right;Strengthening;AROM;3 sets;10 reps  manual resistance                   PT Short Term Goals - 12/18/14 0928    PT SHORT TERM GOAL #  1   Title independent with initial HEP   Status Achieved           PT Long Term Goals - 12/18/14 0928    PT LONG TERM GOAL #1   Title play 9 holes of golf   Status Partially Met   PT LONG TERM GOAL #2   Status On-going               Plan - 12/18/14 0927    Clinical Impression Statement Pt tolerated treatment well. Pt able to progress to isolated strengthen exercises with RLE only. Pt does show increased strength with quad sets against manual resistance. Pt also demos good strength with leg press with RLE only    Pt will benefit from skilled therapeutic intervention in order to improve on the following deficits Abnormal gait;Decreased endurance;Decreased range of motion;Decreased mobility;Decreased strength;Difficulty walking;Pain   PT Frequency 2x / week   PT Duration 8 weeks   PT Next Visit Plan Continue to work on quad strength and decreasing quad lag.  Resisted gait forward/backward/side to side with 2nd  assist on resisted gait.  Start balance training and stairs.        Problem List Patient Active Problem List   Diagnosis Date Noted  . Atopic dermatitis 11/25/2012  . Preop cardiovascular exam 04/14/2011  . Hypertension 02/26/2011  . Hyperlipidemia 02/26/2011  . Diabetes mellitus 02/26/2011  . Osteoarthritis 02/26/2011  . GE reflux 02/26/2011  . Erectile dysfunction 02/26/2011    [name], PTA 12/18/2014, 9:29 AM  [place], [place], [address] Phone: [phone]   Fax:  [phone]

## 2014-12-20 ENCOUNTER — Ambulatory Visit: Payer: Medicare Other | Admitting: Physical Therapy

## 2014-12-20 DIAGNOSIS — Z7409 Other reduced mobility: Secondary | ICD-10-CM

## 2014-12-20 DIAGNOSIS — M25661 Stiffness of right knee, not elsewhere classified: Secondary | ICD-10-CM

## 2014-12-20 DIAGNOSIS — R262 Difficulty in walking, not elsewhere classified: Secondary | ICD-10-CM | POA: Diagnosis not present

## 2014-12-20 DIAGNOSIS — R29898 Other symptoms and signs involving the musculoskeletal system: Secondary | ICD-10-CM

## 2014-12-20 NOTE — Therapy (Signed)
[place] [address], [place], [address] Phone: [phone]   Fax:  [phone]  Physical Therapy Treatment  Patient Details  Name: Roberto Reid MRN: [id number] Date of Birth: Jul 30, 1932 Referring Provider:  Vickey Huger, MD  Encounter Date: 12/20/2014      PT End of Session - 12/20/14 0935    Visit Number 9   Date for PT Re-Evaluation 01/23/15   PT Start Time 0846   PT Stop Time 0928   PT Time Calculation (min) 42 min   Equipment Utilized During Treatment Other (comment)  assist w/resisted gait, standing hip thrusts   Activity Tolerance Patient tolerated treatment well   Behavior During Therapy [facility] for tasks assessed/performed      Past Medical History  Diagnosis Date  . GERD (gastroesophageal reflux disease)   . Hyperlipidemia   . Hearing loss   . Hypertension     DR Jeoffrey Massed  . Asthma     AS CHILD ONLY   . Cellulitis   . Kidney stone   . Arthritis   . Pneumonia     hx of PNA  . Diabetes mellitus     Past Surgical History  Procedure Laterality Date  . Repair torn knee cartilage    . Eye surgery      cataract, bilateral 11/10  . Elbow surgery      RIGHT  . Tonsillectomy    . Knee arthroplasty  12/14/2011    LEFT KNEE  . Total knee arthroplasty  12/14/2011    Procedure: TOTAL KNEE ARTHROPLASTY;  Surgeon: Rudean Haskell, MD;  Location: [place];  Service: Orthopedics;  Laterality: Left;  . Foot surgery Right 2010    right foot "attempted reconstruction" per pt  . Knee arthroscopy Right 10/08/2014    Procedure: ARTHROSCOPY KNEE;  Surgeon: Vickey Huger, MD;  Location: [place];  Service: Orthopedics;  Laterality: Right;  . Quadriceps tendon repair Right 10/08/2014    Procedure: REPAIR QUADRICEP TENDON;  Surgeon: Vickey Huger, MD;  Location: [place];  Service: Orthopedics;  Laterality: Right;    There were no vitals filed for this visit.  Visit Diagnosis:  Difficulty walking  Knee  stiffness, right  Weakness of right lower extremity  Decreased mobility and endurance      Subjective Assessment - 12/20/14 0847    Subjective Doing well no increased pain or soreness                         [facility] Adult PT Treatment/Exercise - 12/20/14 0001    Knee/Hip Exercises: Aerobic   Recumbent Bike 6'   Knee/Hip Exercises: Machines for Strengthening   Cybex Leg Press 30# RLE 8 reps 3 sets    Knee/Hip Exercises: Standing   Gait Training resisted gait forward/backward/side to side 65f x 2   Other Standing Knee Exercises Hip thrusts/knee ext with 5lb belt in barco with scap rows-red band   Knee/Hip Exercises: Seated   Long Arc Quad AROM;AAROM;Strengthening;Right;2 sets;10 reps   Sit to S[employer] Electric2 sets;10 reps;without UE support   Knee/Hip Exercises: Supine   Quad Sets Right;Strengthening;AROM;2 sets;10 reps  added into LAQ-assist at end of LAQ to get full ext   QTarget CorporationLimitations assist at end range                PT Education - 12/20/14 0934    Education provided Yes   Education Details  gym equipment use-bike, nustep, UE exercises, leg press with less weight and good , HEP   Person(s) Educated Patient   Methods Explanation;Demonstration;Tactile cues;Verbal cues   Comprehension Verbalized understanding          PT Short Term Goals - 12/18/14 0928    PT SHORT TERM GOAL #1   Title independent with initial HEP   Status Achieved           PT Long Term Goals - 12/18/14 0928    PT LONG TERM GOAL #1   Title play 9 holes of golf   Status Partially Met   PT LONG TERM GOAL #2   Status On-going               Plan - 12/20/14 0936    Clinical Impression Statement Pt did well today.  Asked about the point of his brace now that it is unlocked. He complained of it being cumbersome during putting and chipping.  Advised pt to speak with his physician about a possible shorter/smaller brace.  Pt still has 25 degree quad lag but is improving.   Still lacks endurance in resisted gait and sit to stands.    Rehab Potential Good   PT Frequency 2x / week   PT Duration 8 weeks   PT Treatment/Interventions Electrical Stimulation;Cryotherapy;Ultrasound;Gait training;Balance training;Therapeutic exercise;Functional mobility training;Stair training;Patient/family education;Manual techniques   PT Next Visit Plan Continue to work on quad strength and decreasing quad lag.  Resisted gait forward/backward/side to side with 2nd assist on resisted gait.  Start balance training and stairs.   Consulted and Agree with Plan of Care Patient        Problem List Patient Active Problem List   Diagnosis Date Noted  . Atopic dermatitis 11/25/2012  . Preop cardiovascular exam 04/14/2011  . Hypertension 02/26/2011  . Hyperlipidemia 02/26/2011  . Diabetes mellitus 02/26/2011  . Osteoarthritis 02/26/2011  . GE reflux 02/26/2011  . Erectile dysfunction 02/26/2011    [name], PTA 12/20/2014, 10:18 AM  [address] [place], [place], [address] Phone: [phone]   Fax:  [phone]

## 2014-12-25 ENCOUNTER — Ambulatory Visit: Payer: Medicare Other | Admitting: Physical Therapy

## 2014-12-26 ENCOUNTER — Encounter: Payer: Self-pay | Admitting: Physical Therapy

## 2014-12-26 ENCOUNTER — Ambulatory Visit: Payer: Medicare Other | Admitting: Physical Therapy

## 2014-12-26 DIAGNOSIS — R262 Difficulty in walking, not elsewhere classified: Secondary | ICD-10-CM | POA: Diagnosis not present

## 2014-12-26 DIAGNOSIS — Z7409 Other reduced mobility: Secondary | ICD-10-CM

## 2014-12-26 DIAGNOSIS — R29898 Other symptoms and signs involving the musculoskeletal system: Secondary | ICD-10-CM

## 2014-12-26 NOTE — Therapy (Signed)
[place], [place], [address] Phone: [phone]   Fax:  [phone]  Physical Therapy Treatment  Patient Details  Name: Roberto Reid MRN: [id number] Date of Birth: 1933/02/18 Referring Provider:  Vickey Huger, MD  Encounter Date: 12/26/2014      PT End of Session - 12/26/14 1012    Visit Number 10   Date for PT Re-Evaluation 01/23/15   PT Start Time 0934   PT Stop Time 1013   PT Time Calculation (min) 39 min   Activity Tolerance Patient tolerated treatment well;Patient limited by fatigue   Behavior During Therapy [facility] for tasks assessed/performed      Past Medical History  Diagnosis Date  . GERD (gastroesophageal reflux disease)   . Hyperlipidemia   . Hearing loss   . Hypertension     DR Jeoffrey Massed  . Asthma     AS CHILD ONLY   . Cellulitis   . Kidney stone   . Arthritis   . Pneumonia     hx of PNA  . Diabetes mellitus     Past Surgical History  Procedure Laterality Date  . Repair torn knee cartilage    . Eye surgery      cataract, bilateral 11/10  . Elbow surgery      RIGHT  . Tonsillectomy    . Knee arthroplasty  12/14/2011    LEFT KNEE  . Total knee arthroplasty  12/14/2011    Procedure: TOTAL KNEE ARTHROPLASTY;  Surgeon: Rudean Haskell, MD;  Location: [place];  Service: Orthopedics;  Laterality: Left;  . Foot surgery Right 2010    right foot "attempted reconstruction" per pt  . Knee arthroscopy Right 10/08/2014    Procedure: ARTHROSCOPY KNEE;  Surgeon: Vickey Huger, MD;  Location: [place];  Service: Orthopedics;  Laterality: Right;  . Quadriceps tendon repair Right 10/08/2014    Procedure: REPAIR QUADRICEP TENDON;  Surgeon: Vickey Huger, MD;  Location: [place];  Service: Orthopedics;  Laterality: Right;    There were no vitals filed for this visit.  Visit Diagnosis:  Weakness of right lower extremity  Decreased mobility and endurance      Subjective Assessment -  12/26/14 0938    Subjective Pt reports not change and no new issues    Pertinent History some limitations due to hesitancy, no pain limitations but strength limitations.   Currently in Pain? No/denies   Pain Score 0-No pain                         [facility] Adult PT Treatment/Exercise - 12/26/14 0001    Knee/Hip Exercises: Aerobic   Recumbent Bike x6' L2   Knee/Hip Exercises: Machines for Strengthening   Cybex Knee Extension #10 10 reps 3 sets    Cybex Leg Press 30# RLE 10 reps, #40 8 reps 2 sets      Knee/Hip Exercises: Seated   Sit to Sand 1 set;10 reps;with UE support  With ball squeeze, 2nd set 7 reps holding yellow weighted ball   Knee/Hip Exercises: Supine   Quad Sets Right;Strengthening;AROM;2 sets;15 reps  with manual resistance    Bridges with Diona Foley Squeeze 3 sets;10 reps;Both;Strengthening   Straight Leg Raises 1 set;10 reps;Right   Straight Leg Raise with External Rotation 10 reps;Right;2 sets                  PT Short Term Goals -  12/18/14 0928    PT SHORT TERM GOAL #1   Title independent with initial HEP   Status Achieved           PT Long Term Goals - 12/26/14 1017    PT LONG TERM GOAL #2   Title increase AROM of the right knee to 5-115 degrees flexion   Status On-going   PT LONG TERM GOAL #3   Title able to do SLR without quad lag   Status On-going   PT LONG TERM GOAL #4   Title walk all distances without assistive device   Status Partially Met               Plan - 12/26/14 1014    Clinical Impression Statement Pt did well today and showed great effort. Pt continues to lack full R knee extension ROM. Pt still demo extensor lag with SLR    Pt will benefit from skilled therapeutic intervention in order to improve on the following deficits Abnormal gait;Decreased endurance;Decreased range of motion;Decreased mobility;Decreased strength;Difficulty walking;Pain   Rehab Potential Good   PT Frequency 2x / week   PT Duration 8  weeks   PT Treatment/Interventions Electrical Stimulation;Cryotherapy;Ultrasound;Gait training;Balance training;Therapeutic exercise;Functional mobility training;Stair training;Patient/family education;Manual techniques   PT Next Visit Plan Continue to work on quad strength and decreasing quad lag.  Resisted gait forward/backward/side to side with 2nd assist on resisted gait.  Start balance training and stairs.        Problem List Patient Active Problem List   Diagnosis Date Noted  . Atopic dermatitis 11/25/2012  . Preop cardiovascular exam 04/14/2011  . Hypertension 02/26/2011  . Hyperlipidemia 02/26/2011  . Diabetes mellitus 02/26/2011  . Osteoarthritis 02/26/2011  . GE reflux 02/26/2011  . Erectile dysfunction 02/26/2011    [name], PTA 12/26/2014, 10:18 AM  [address] [place], [place], [address] Phone: [phone]   Fax:  [phone]

## 2014-12-27 ENCOUNTER — Encounter: Payer: Self-pay | Admitting: Physical Therapy

## 2014-12-27 ENCOUNTER — Ambulatory Visit: Payer: Medicare Other | Admitting: Physical Therapy

## 2014-12-27 DIAGNOSIS — R262 Difficulty in walking, not elsewhere classified: Secondary | ICD-10-CM | POA: Diagnosis not present

## 2014-12-27 DIAGNOSIS — Z7409 Other reduced mobility: Secondary | ICD-10-CM

## 2014-12-27 DIAGNOSIS — R29898 Other symptoms and signs involving the musculoskeletal system: Secondary | ICD-10-CM

## 2014-12-27 NOTE — Therapy (Signed)
[place], [place], [address] Phone: [phone]   Fax:  [phone]  Physical Therapy Treatment  Patient Details  Name: Roberto Reid MRN: [id number] Date of Birth: 04-25-1933 Referring Provider:  Vickey Huger, MD  Encounter Date: 12/27/2014      PT End of Session - 12/27/14 0926    Visit Number 11   PT Start Time 8185   PT Stop Time 0926   PT Time Calculation (min) 32 min   Activity Tolerance Patient tolerated treatment well;Patient limited by fatigue   Behavior During Therapy [facility] for tasks assessed/performed      Past Medical History  Diagnosis Date  . GERD (gastroesophageal reflux disease)   . Hyperlipidemia   . Hearing loss   . Hypertension     DR Jeoffrey Massed  . Asthma     AS CHILD ONLY   . Cellulitis   . Kidney stone   . Arthritis   . Pneumonia     hx of PNA  . Diabetes mellitus     Past Surgical History  Procedure Laterality Date  . Repair torn knee cartilage    . Eye surgery      cataract, bilateral 11/10  . Elbow surgery      RIGHT  . Tonsillectomy    . Knee arthroplasty  12/14/2011    LEFT KNEE  . Total knee arthroplasty  12/14/2011    Procedure: TOTAL KNEE ARTHROPLASTY;  Surgeon: Rudean Haskell, MD;  Location: [place];  Service: Orthopedics;  Laterality: Left;  . Foot surgery Right 2010    right foot "attempted reconstruction" per pt  . Knee arthroscopy Right 10/08/2014    Procedure: ARTHROSCOPY KNEE;  Surgeon: Vickey Huger, MD;  Location: [place];  Service: Orthopedics;  Laterality: Right;  . Quadriceps tendon repair Right 10/08/2014    Procedure: REPAIR QUADRICEP TENDON;  Surgeon: Vickey Huger, MD;  Location: [place];  Service: Orthopedics;  Laterality: Right;    There were no vitals filed for this visit.  Visit Diagnosis:  Weakness of right lower extremity  Difficulty walking  Decreased mobility and endurance      Subjective Assessment - 12/27/14 0854     Subjective Pt reports that he is tired because he had a very long day yesterday. Pt does report that he R knee is a little sore.    Currently in Pain? Yes   Pain Score 2    Pain Location Knee   Pain Orientation Right   Pain Descriptors / Indicators Sore                         [facility] Adult PT Treatment/Exercise - 12/27/14 0001    Knee/Hip Exercises: Aerobic   Recumbent Bike 6 min    Knee/Hip Exercises: Machines for Strengthening   Cybex Knee Extension #5 10 reps 2 sets    Knee/Hip Exercises: Standing   Forward Step Up Right;10 reps;3 sets;Step Height: 4";Step Height: 6"   Knee/Hip Exercises: Seated   Sit to Sand 1 set;10 reps;with UE support  On airex pad    Knee/Hip Exercises: Supine   Bridges with Ball Squeeze 2 sets;10 reps   Straight Leg Raise with External Rotation 10 reps;Right;2 sets   Other Supine Knee/Hip Exercises SLR eccentrics 1.5Lb 10 reps 2 sets                   PT Short  Term Goals - 12/18/14 4081    PT SHORT TERM GOAL #1   Title independent with initial HEP   Status Achieved           PT Long Term Goals - 01-01-15 1017    PT LONG TERM GOAL #2   Title increase AROM of the right knee to 5-115 degrees flexion   Status On-going   PT LONG TERM GOAL #3   Title able to do SLR without quad lag   Status On-going   PT LONG TERM GOAL #4   Title walk all distances without assistive device   Status Partially Met               Plan - 12/27/14 0926    Clinical Impression Statement Pt 10 min late for PT treatment. Pt tolerated treatment fair. Pt dis show signs of fatigue could be due to having a therapy session yesterday. Continues to have significant extensor lag with SLR   Pt will benefit from skilled therapeutic intervention in order to improve on the following deficits Abnormal gait;Decreased endurance;Decreased range of motion;Decreased mobility;Decreased strength;Difficulty walking;Pain   Rehab Potential Good   PT Frequency  2x / week   PT Duration 8 weeks   PT Treatment/Interventions Electrical Stimulation;Cryotherapy;Ultrasound;Gait training;Balance training;Therapeutic exercise;Functional mobility training;Stair training;Patient/family education;Manual techniques   PT Next Visit Plan Stair training           G-Codes - Jan 01, 2015 1103    Functional Assessment Tool Used FOTO   Functional Limitation Mobility: Walking and moving around   Mobility: Walking and Moving Around Current Status [phone]) At least 40 percent but less than 60 percent impaired, limited or restricted   Mobility: Walking and Moving Around Goal Status [phone]) At least 40 percent but less than 60 percent impaired, limited or restricted      Problem List Patient Active Problem List   Diagnosis Date Noted  . Atopic dermatitis 11/25/2012  . Preop cardiovascular exam 04/14/2011  . Hypertension 02/26/2011  . Hyperlipidemia 02/26/2011  . Diabetes mellitus 02/26/2011  . Osteoarthritis 02/26/2011  . GE reflux 02/26/2011  . Erectile dysfunction 02/26/2011    [name], PTA 12/27/2014, 9:29 AM  [address] [place], [place], [address] Phone: [phone]   Fax:  [phone]

## 2014-12-31 ENCOUNTER — Telehealth: Payer: Self-pay | Admitting: *Deleted

## 2014-12-31 ENCOUNTER — Ambulatory Visit: Payer: Medicare Other | Attending: Orthopedic Surgery | Admitting: Physical Therapy

## 2014-12-31 DIAGNOSIS — R29898 Other symptoms and signs involving the musculoskeletal system: Secondary | ICD-10-CM | POA: Insufficient documentation

## 2014-12-31 DIAGNOSIS — M25661 Stiffness of right knee, not elsewhere classified: Secondary | ICD-10-CM | POA: Diagnosis present

## 2014-12-31 DIAGNOSIS — R262 Difficulty in walking, not elsewhere classified: Secondary | ICD-10-CM

## 2014-12-31 DIAGNOSIS — Z7409 Other reduced mobility: Secondary | ICD-10-CM | POA: Diagnosis present

## 2014-12-31 NOTE — Therapy (Signed)
[place], [place], [address] Phone: [phone]   Fax:  [phone]  Physical Therapy Treatment  Patient Details  Name: Roberto Reid MRN: [id number] Date of Birth: 1933/03/09 Referring Provider:  Vickey Huger, MD  Encounter Date: 12/31/2014      PT End of Session - 12/31/14 0858    Visit Number 12   Date for PT Re-Evaluation 01/23/15   PT Start Time 0854   PT Stop Time 0930   PT Time Calculation (min) 36 min   Equipment Utilized During Treatment Other (comment)   Activity Tolerance Patient tolerated treatment well;Patient limited by fatigue   Behavior During Therapy [facility] for tasks assessed/performed      Past Medical History  Diagnosis Date  . GERD (gastroesophageal reflux disease)   . Hyperlipidemia   . Hearing loss   . Hypertension     DR Jeoffrey Massed  . Asthma     AS CHILD ONLY   . Cellulitis   . Kidney stone   . Arthritis   . Pneumonia     hx of PNA  . Diabetes mellitus     Past Surgical History  Procedure Laterality Date  . Repair torn knee cartilage    . Eye surgery      cataract, bilateral 11/10  . Elbow surgery      RIGHT  . Tonsillectomy    . Knee arthroplasty  12/14/2011    LEFT KNEE  . Total knee arthroplasty  12/14/2011    Procedure: TOTAL KNEE ARTHROPLASTY;  Surgeon: Rudean Haskell, MD;  Location: [place];  Service: Orthopedics;  Laterality: Left;  . Foot surgery Right 2010    right foot "attempted reconstruction" per pt  . Knee arthroscopy Right 10/08/2014    Procedure: ARTHROSCOPY KNEE;  Surgeon: Vickey Huger, MD;  Location: [place];  Service: Orthopedics;  Laterality: Right;  . Quadriceps tendon repair Right 10/08/2014    Procedure: REPAIR QUADRICEP TENDON;  Surgeon: Vickey Huger, MD;  Location: [place];  Service: Orthopedics;  Laterality: Right;    There were no vitals filed for this visit.  Visit Diagnosis:  Weakness of right lower extremity  Difficulty  walking  Decreased mobility and endurance  Knee stiffness, right      Subjective Assessment - 12/31/14 0859    Subjective pt doing good. He is not very communicative this morning. R knee getting better slowly.   Pertinent History some limitations due to hesitancy, no pain limitations but strength limitations.   Limitations Lifting;Standing;Walking   How long can you walk comfortably? 10 min   Patient Stated Goals walk normally, play golf   Currently in Pain? No/denies   Multiple Pain Sites No                         [facility] Adult PT Treatment/Exercise - 12/31/14 0001    Ambulation/Gait   Ambulation/Gait Yes   Ambulation/Gait Assistance 7: Independent   Gait Pattern Decreased step length - right;Decreased step length - left   Ambulation Surface Level   Gait Comments eyes down, forwrad posture   Knee/Hip Exercises: Aerobic   Recumbent Bike L5X6   Knee/Hip Exercises: Standing   Heel Raises Both;1 set;10 reps   Forward Lunges 1 set;5 reps  lunge with L to reverse to R   Terminal Knee Extension Limitations ball push to wall   Knee/Hip Exercises: Seated   Sit to [employer]  10 reps;2 sets;Other (comment)  change foot positions   Knee/Hip Exercises: Supine   Quad Sets Right;1 set;10 reps                PT Education - 12/31/14 0923    Education provided Yes   Education Details standing up posture, walking with eyes forward, increase step length   Methods Explanation;Demonstration;Verbal cues   Comprehension Verbalized understanding;Returned demonstration          PT Short Term Goals - 12/18/14 6834    PT SHORT TERM GOAL #1   Title independent with initial HEP   Status Achieved           PT Long Term Goals - 12/26/14 1017    PT LONG TERM GOAL #2   Title increase AROM of the right knee to 5-115 degrees flexion   Status On-going   PT LONG TERM GOAL #3   Title able to do SLR without quad lag   Status On-going   PT LONG TERM GOAL #4   Title walk  all distances without assistive device   Status Partially Met               Plan - 12/31/14 0925    Clinical Impression Statement Pt late for apt today by almost 15 minutes. tolertaed treat well. worked well with new extension exercises   Pt will benefit from skilled therapeutic intervention in order to improve on the following deficits Abnormal gait;Decreased endurance;Decreased range of motion;Decreased mobility;Decreased strength;Difficulty walking;Pain   Rehab Potential Good   PT Frequency 2x / week   PT Duration 8 weeks   PT Treatment/Interventions Electrical Stimulation;Cryotherapy;Ultrasound;Gait training;Balance training;Therapeutic exercise;Functional mobility training;Stair training;Patient/family education;Manual techniques   PT Next Visit Plan Stair training , did not get to this on last visit.    Consulted and Agree with Plan of Care Patient        Problem List Patient Active Problem List   Diagnosis Date Noted  . Atopic dermatitis 11/25/2012  . Preop cardiovascular exam 04/14/2011  . Hypertension 02/26/2011  . Hyperlipidemia 02/26/2011  . Diabetes mellitus 02/26/2011  . Osteoarthritis 02/26/2011  . GE reflux 02/26/2011  . Erectile dysfunction 02/26/2011     Natividad Brood, PTA  12/31/2014, 9:27 AM  [address] [place], [place], [address] Phone: [phone]   Fax:  [phone]

## 2014-12-31 NOTE — Telephone Encounter (Signed)
Refill once if appropriate 

## 2014-12-31 NOTE — Telephone Encounter (Signed)
Patient would like a refill on his hydrocodone. Please advise

## 2015-01-01 MED ORDER — HYDROCODONE-ACETAMINOPHEN 10-325 MG PO TABS: 1.0000 | ORAL_TABLET | Freq: Three times a day (TID) | ORAL | Status: DC

## 2015-01-01 NOTE — Telephone Encounter (Signed)
[facility] refilled

## 2015-01-02 ENCOUNTER — Encounter: Payer: Self-pay | Admitting: Physical Therapy

## 2015-01-02 ENCOUNTER — Ambulatory Visit: Payer: Medicare Other | Admitting: Physical Therapy

## 2015-01-02 DIAGNOSIS — R29898 Other symptoms and signs involving the musculoskeletal system: Secondary | ICD-10-CM | POA: Diagnosis not present

## 2015-01-02 DIAGNOSIS — Z7409 Other reduced mobility: Secondary | ICD-10-CM

## 2015-01-02 DIAGNOSIS — R262 Difficulty in walking, not elsewhere classified: Secondary | ICD-10-CM

## 2015-01-02 NOTE — Therapy (Signed)
[place], [place], [address] Phone: [phone]   Fax:  [phone]  Physical Therapy Treatment  Patient Details  Name: Roberto Reid MRN: [id number] Date of Birth: 11/28/1932 Referring Provider:  Vickey Huger, MD  Encounter Date: 01/02/2015      PT End of Session - 01/02/15 0928    Visit Number 13   Date for PT Re-Evaluation 01/23/15   PT Start Time 0853   PT Stop Time 0928   PT Time Calculation (min) 35 min   Activity Tolerance Patient tolerated treatment well;Patient limited by fatigue   Behavior During Therapy [facility] for tasks assessed/performed      Past Medical History  Diagnosis Date  . GERD (gastroesophageal reflux disease)   . Hyperlipidemia   . Hearing loss   . Hypertension     DR Jeoffrey Massed  . Asthma     AS CHILD ONLY   . Cellulitis   . Kidney stone   . Arthritis   . Pneumonia     hx of PNA  . Diabetes mellitus     Past Surgical History  Procedure Laterality Date  . Repair torn knee cartilage    . Eye surgery      cataract, bilateral 11/10  . Elbow surgery      RIGHT  . Tonsillectomy    . Knee arthroplasty  12/14/2011    LEFT KNEE  . Total knee arthroplasty  12/14/2011    Procedure: TOTAL KNEE ARTHROPLASTY;  Surgeon: Rudean Haskell, MD;  Location: [place];  Service: Orthopedics;  Laterality: Left;  . Foot surgery Right 2010    right foot "attempted reconstruction" per pt  . Knee arthroscopy Right 10/08/2014    Procedure: ARTHROSCOPY KNEE;  Surgeon: Vickey Huger, MD;  Location: [place];  Service: Orthopedics;  Laterality: Right;  . Quadriceps tendon repair Right 10/08/2014    Procedure: REPAIR QUADRICEP TENDON;  Surgeon: Vickey Huger, MD;  Location: [place];  Service: Orthopedics;  Laterality: Right;    There were no vitals filed for this visit.  Visit Diagnosis:  Weakness of right lower extremity  Decreased mobility and endurance  Difficulty walking       Subjective Assessment - 01/02/15 0853    Subjective Pt ambulates in clinic without AD. Pt reports no new changes    Pertinent History some limitations due to hesitancy, no pain limitations but strength limitations.   Limitations Lifting;Standing;Walking   How long can you walk comfortably? 10 min   Patient Stated Goals walk normally, play golf   Currently in Pain? No/denies   Pain Score 0-No pain   Pain Location Knee   Pain Orientation Right                         [facility] Adult PT Treatment/Exercise - 01/02/15 0001    Knee/Hip Exercises: Aerobic   Recumbent Bike x8 min    Knee/Hip Exercises: Machines for Strengthening   Cybex Knee Extension #5 10 reps 2 sets    Cybex Leg Press #40 6 reps 4 sets      Knee/Hip Exercises: Supine   Quad Sets Right;10 reps;3 sets   Bridges with Ball Squeeze 2 sets;10 reps   Straight Leg Raise with External Rotation 10 reps;Right;2 sets;15 reps   Other Supine Knee/Hip Exercises SLR eccentrics 10 reps 2 sets  PT Short Term Goals - 12/18/14 1583    PT SHORT TERM GOAL #1   Title independent with initial HEP   Status Achieved           PT Long Term Goals - 01/02/15 0932    PT LONG TERM GOAL #2   Title increase AROM of the right knee to 5-115 degrees flexion   Status On-going   PT LONG TERM GOAL #3   Title able to do SLR without quad lag   Status On-going   PT LONG TERM GOAL #4   Title walk all distances without assistive device   Status Partially Met               Plan - 01/02/15 0929    Clinical Impression Statement Pt 10 minutes late for PT session. Pt tolerated strengthen exercises well but is limited by fatigue.    Pt will benefit from skilled therapeutic intervention in order to improve on the following deficits Abnormal gait;Decreased endurance;Decreased range of motion;Decreased mobility;Decreased strength;Difficulty walking;Pain   PT Frequency 2x / week   PT Duration 8 weeks   PT  Treatment/Interventions Electrical Stimulation;Cryotherapy;Ultrasound;Gait training;Balance training;Therapeutic exercise;Functional mobility training;Stair training;Patient/family education;Manual techniques   PT Next Visit Plan possible stair training         Problem List Patient Active Problem List   Diagnosis Date Noted  . Atopic dermatitis 11/25/2012  . Preop cardiovascular exam 04/14/2011  . Hypertension 02/26/2011  . Hyperlipidemia 02/26/2011  . Diabetes mellitus 02/26/2011  . Osteoarthritis 02/26/2011  . GE reflux 02/26/2011  . Erectile dysfunction 02/26/2011    [name], PTA 01/02/2015, 9:33 AM  [address] [place], [place], [address] Phone: [phone]   Fax:  [phone]

## 2015-01-04 ENCOUNTER — Other Ambulatory Visit: Payer: Self-pay | Admitting: *Deleted

## 2015-01-04 NOTE — Telephone Encounter (Signed)
Refilled viagra

## 2015-01-07 ENCOUNTER — Ambulatory Visit: Payer: Medicare Other | Admitting: Physical Therapy

## 2015-01-07 DIAGNOSIS — R29898 Other symptoms and signs involving the musculoskeletal system: Secondary | ICD-10-CM

## 2015-01-07 DIAGNOSIS — R262 Difficulty in walking, not elsewhere classified: Secondary | ICD-10-CM

## 2015-01-07 DIAGNOSIS — M25661 Stiffness of right knee, not elsewhere classified: Secondary | ICD-10-CM

## 2015-01-07 DIAGNOSIS — Z7409 Other reduced mobility: Secondary | ICD-10-CM

## 2015-01-07 NOTE — Therapy (Signed)
[place], [place], [address] Phone: [phone]   Fax:  [phone]  Physical Therapy Treatment  Patient Details  Name: Roberto Reid MRN: [id number] Date of Birth: [birth year] Referring Provider:  Vickey Huger, MD  Encounter Date: 01/07/2015      PT End of Session - 01/07/15 1501    Visit Number 14   Date for PT Re-Evaluation 01/23/15   PT Start Time 4765   PT Stop Time 1450   PT Time Calculation (min) 45 min   Activity Tolerance Patient tolerated treatment well;Patient limited by fatigue   Behavior During Therapy [facility] for tasks assessed/performed      Past Medical History  Diagnosis Date  . GERD (gastroesophageal reflux disease)   . Hyperlipidemia   . Hearing loss   . Hypertension     DR Jeoffrey Massed  . Asthma     AS CHILD ONLY   . Cellulitis   . Kidney stone   . Arthritis   . Pneumonia     hx of PNA  . Diabetes mellitus     Past Surgical History  Procedure Laterality Date  . Repair torn knee cartilage    . Eye surgery      cataract, bilateral 11/10  . Elbow surgery      RIGHT  . Tonsillectomy    . Knee arthroplasty  12/14/2011    LEFT KNEE  . Total knee arthroplasty  12/14/2011    Procedure: TOTAL KNEE ARTHROPLASTY;  Surgeon: Rudean Haskell, MD;  Location: [place];  Service: Orthopedics;  Laterality: Left;  . Foot surgery Right 2010    right foot "attempted reconstruction" per pt  . Knee arthroscopy Right 10/08/2014    Procedure: ARTHROSCOPY KNEE;  Surgeon: Vickey Huger, MD;  Location: [place];  Service: Orthopedics;  Laterality: Right;  . Quadriceps tendon repair Right 10/08/2014    Procedure: REPAIR QUADRICEP TENDON;  Surgeon: Vickey Huger, MD;  Location: [place];  Service: Orthopedics;  Laterality: Right;    There were no vitals filed for this visit.  Visit Diagnosis:  Weakness of right lower extremity  Decreased mobility and endurance  Difficulty walking  Knee  stiffness, right      Subjective Assessment - 01/07/15 1406    Subjective Pt reports no new changes, walks the majority of the time without the cane but does use ACE wrap for support.  Occasionally the knee will "buckle"   Limitations Lifting;Standing;Walking   How long can you walk comfortably? 10 min   Patient Stated Goals walk normally, play golf   Currently in Pain? No/denies                         [facility] Adult PT Treatment/Exercise - 01/07/15 0001    Knee/Hip Exercises: Aerobic   Recumbent Bike x8 min, L 2    Knee/Hip Exercises: Machines for Strengthening   Cybex Leg Press 50# BIL, green ball squeeze 3 x 12 reps, 30# SL 4 x 6 reps   Knee/Hip Exercises: Standing   Hip Extension Stengthening;Right;2 sets  12 reps, w/pulley around ankle, push out w/hip ext/knee ext   Extension Limitations 1 set 10#, 1 set 15#  knee flex then push back into hip/knee ext   Forward Step Up Both;2 sets;Hand Hold: 1  12 reps x 2, knee lock out at top, hesitant at first   Forward Step Up Limitations 6" box  Stairs 2 x 1 flight, step-to going down, alternating going up  HHA x 1 plus railing, assist in front stabilizing R knee   Other Standing Knee Exercises step up 4" box with R hip/knee ext near wall, HHA x 1   Other Standing Knee Exercises cross-over step up R knee w/wall support and HHA x 1  pt very weak in R glute on this exercise but improve w/reps                PT Education - 01/07/15 1501    Education provided Yes   Education Details wall squats at home, stair navigation, HEP with quad exercises, biking   Person(s) Educated Patient   Methods Explanation   Comprehension Verbalized understanding          PT Short Term Goals - 12/18/14 0928    PT SHORT TERM GOAL #1   Title independent with initial HEP   Status Achieved           PT Long Term Goals - 01/02/15 0932    PT LONG TERM GOAL #2   Title increase AROM of the right knee to 5-115 degrees flexion    Status On-going   PT LONG TERM GOAL #3   Title able to do SLR without quad lag   Status On-going   PT LONG TERM GOAL #4   Title walk all distances without assistive device   Status Partially Met               Plan - 01/07/15 1502    Clinical Impression Statement Pt navigated stairs well.  Very hesitant to lead with left leg going down.  Railing and HHA x 1, as well as having someone below supporting L knee.  improved with practice.  Tried crossover step ups today and pt was initially very weak but improved with practice.  Pt is hesitant and slighty scared to try new and challenging exercises but when he does he improves quickly and gains confidence.  Pt still displays quad/glute weakness and decrease in balance/stability but improves within the session.  Pt has Dr. appt next Tues where he needs written report   Rehab Potential Good   PT Frequency 2x / week   PT Duration 8 weeks   PT Treatment/Interventions Electrical Stimulation;Cryotherapy;Ultrasound;Gait training;Balance training;Therapeutic exercise;Functional mobility training;Stair training;Patient/family education;Manual techniques   PT Next Visit Plan continue stair training, glute strengthening, quad strengthening, balance training   Consulted and Agree with Plan of Care Patient        Problem List Patient Active Problem List   Diagnosis Date Noted  . Atopic dermatitis 11/25/2012  . Preop cardiovascular exam 04/14/2011  . Hypertension 02/26/2011  . Hyperlipidemia 02/26/2011  . Diabetes mellitus 02/26/2011  . Osteoarthritis 02/26/2011  . GE reflux 02/26/2011  . Erectile dysfunction 02/26/2011    Sumner Boast., PT 01/07/2015, 4:03 PM  [address] [place], [place], [address] Phone: [phone]   Fax:  [phone]

## 2015-01-09 ENCOUNTER — Ambulatory Visit: Payer: Medicare Other | Admitting: Physical Therapy

## 2015-01-10 ENCOUNTER — Ambulatory Visit: Payer: Medicare Other | Admitting: Physical Therapy

## 2015-01-10 ENCOUNTER — Encounter: Payer: Self-pay | Admitting: Physical Therapy

## 2015-01-10 DIAGNOSIS — R29898 Other symptoms and signs involving the musculoskeletal system: Secondary | ICD-10-CM

## 2015-01-10 DIAGNOSIS — Z7409 Other reduced mobility: Secondary | ICD-10-CM

## 2015-01-10 NOTE — Therapy (Signed)
[place], [place], [address] Phone: [phone]   Fax:  [phone]  Physical Therapy Treatment  Patient Details  Name: Roberto Reid MRN: [id number] Date of Birth: 1933/03/30 Referring Provider:  Vickey Huger, MD  Encounter Date: 01/10/2015      PT End of Session - 01/10/15 0933    Visit Number 15   Date for PT Re-Evaluation 01/23/15   PT Start Time 0851   PT Stop Time 0933   PT Time Calculation (min) 42 min   Activity Tolerance Patient tolerated treatment well;Patient limited by fatigue   Behavior During Therapy [facility] for tasks assessed/performed      Past Medical History  Diagnosis Date  . GERD (gastroesophageal reflux disease)   . Hyperlipidemia   . Hearing loss   . Hypertension     DR Jeoffrey Massed  . Asthma     AS CHILD ONLY   . Cellulitis   . Kidney stone   . Arthritis   . Pneumonia     hx of PNA  . Diabetes mellitus     Past Surgical History  Procedure Laterality Date  . Repair torn knee cartilage    . Eye surgery      cataract, bilateral 11/10  . Elbow surgery      RIGHT  . Tonsillectomy    . Knee arthroplasty  12/14/2011    LEFT KNEE  . Total knee arthroplasty  12/14/2011    Procedure: TOTAL KNEE ARTHROPLASTY;  Surgeon: Rudean Haskell, MD;  Location: [place];  Service: Orthopedics;  Laterality: Left;  . Foot surgery Right 2010    right foot "attempted reconstruction" per pt  . Knee arthroscopy Right 10/08/2014    Procedure: ARTHROSCOPY KNEE;  Surgeon: Vickey Huger, MD;  Location: [place];  Service: Orthopedics;  Laterality: Right;  . Quadriceps tendon repair Right 10/08/2014    Procedure: REPAIR QUADRICEP TENDON;  Surgeon: Vickey Huger, MD;  Location: [place];  Service: Orthopedics;  Laterality: Right;    There were no vitals filed for this visit.  Visit Diagnosis:  Weakness of right lower extremity  Decreased mobility and endurance      Subjective Assessment -  01/10/15 0851    Subjective Pt reports that he is a little tired but everything is ok.   Limitations --  Golf    Patient Stated Goals walk normally, play golf   Currently in Pain? No/denies   Pain Score 0-No pain                         [facility] Adult PT Treatment/Exercise - 01/10/15 0001    Ambulation/Gait   Stairs Yes   Stairs Assistance 4: Min guard   Stair Management Technique One rail Right;One rail Left;Alternating pattern;Step to pattern   Number of Stairs 12   Height of Stairs 6   Gait Comments x2, therapist support RLE, alternating pattern acceding   Knee/Hip Exercises: Aerobic   Recumbent Bike x6 min, L 0    Knee/Hip Exercises: Machines for Strengthening   Cybex Knee Extension #15 eccentrics 7 reps, 4 reps, 4 reps    therapist assist    Cybex Leg Press 50# BIL, green ball squeeze 3 x 12 reps, 40# SL 5 x 2 reps   Knee/Hip Exercises: Standing   Lateral Step Up 10 reps;Step Height: 4";3 sets   Knee/Hip Exercises: Seated   Sit to Sand 3  sets;10 reps;without UE support  1st slow, 2nd fast, 3rd blue Tband around RLE                  PT Short Term Goals - 12/18/14 [id number]    PT SHORT TERM GOAL #1   Title independent with initial HEP   Status Achieved           PT Long Term Goals - 01/10/15 0940    PT LONG TERM GOAL #2   Title increase AROM of the right knee to 5-115 degrees flexion   Status On-going   PT LONG TERM GOAL #3   Title able to do SLR without quad lag   Status On-going   PT LONG TERM GOAL #4   Title walk all distances without assistive device   Status Achieved   PT LONG TERM GOAL #5   Title go up and down stairs reciprocally   Status On-going               Plan - 01/10/15 0934    Clinical Impression Statement Pt continued with stair navigation this date. Pt remains hesitant when controlling descents with RLE, therapist needed to support RLE below knee. Pt able to tolerate sit to stand at various speeds..Pt continues to  fatigue during exercises requiring multiple cues to breath during exercise.   Pt will benefit from skilled therapeutic intervention in order to improve on the following deficits Abnormal gait;Decreased endurance;Decreased range of motion;Decreased mobility;Decreased strength;Difficulty walking;Pain   Rehab Potential Good   PT Frequency 2x / week   PT Duration 8 weeks   PT Treatment/Interventions Electrical Stimulation;Cryotherapy;Ultrasound;Gait training;Balance training;Therapeutic exercise;Functional mobility training;Stair training;Patient/family education;Manual techniques   PT Next Visit Plan continue stair training, glute strengthening, quad strengthening, balance training        Problem List Patient Active Problem List   Diagnosis Date Noted  . Atopic dermatitis 11/25/2012  . Preop cardiovascular exam 04/14/2011  . Hypertension 02/26/2011  . Hyperlipidemia 02/26/2011  . Diabetes mellitus 02/26/2011  . Osteoarthritis 02/26/2011  . GE reflux 02/26/2011  . Erectile dysfunction 02/26/2011    [name], PTA 01/10/2015, 9:41 AM  [address] [place], [place], [address] Phone: [phone]   Fax:  [phone]

## 2015-01-14 ENCOUNTER — Ambulatory Visit: Payer: Medicare Other | Admitting: Physical Therapy

## 2015-01-14 ENCOUNTER — Encounter: Payer: Self-pay | Admitting: Physical Therapy

## 2015-01-14 DIAGNOSIS — Z7409 Other reduced mobility: Secondary | ICD-10-CM

## 2015-01-14 DIAGNOSIS — R29898 Other symptoms and signs involving the musculoskeletal system: Secondary | ICD-10-CM

## 2015-01-14 NOTE — Therapy (Addendum)
[place], [place], [address] Phone: [phone]   Fax:  [phone]  Physical Therapy Treatment  Patient Details  Name: Roberto Reid MRN: [id number] Date of Birth: 06-10-1932 Referring Provider:  Vickey Huger, MD  Encounter Date: 01/14/2015      PT End of Session - 01/14/15 1015    Visit Number 16   Date for PT Re-Evaluation 01/23/15   PT Start Time 0940   PT Stop Time 1016   PT Time Calculation (min) 36 min   Activity Tolerance Patient tolerated treatment well;Patient limited by fatigue   Behavior During Therapy [facility] for tasks assessed/performed      Past Medical History  Diagnosis Date  . GERD (gastroesophageal reflux disease)   . Hyperlipidemia   . Hearing loss   . Hypertension     DR Jeoffrey Massed  . Asthma     AS CHILD ONLY   . Cellulitis   . Kidney stone   . Arthritis   . Pneumonia     hx of PNA  . Diabetes mellitus     Past Surgical History  Procedure Laterality Date  . Repair torn knee cartilage    . Eye surgery      cataract, bilateral 11/10  . Elbow surgery      RIGHT  . Tonsillectomy    . Knee arthroplasty  12/14/2011    LEFT KNEE  . Total knee arthroplasty  12/14/2011    Procedure: TOTAL KNEE ARTHROPLASTY;  Surgeon: Rudean Haskell, MD;  Location: [place];  Service: Orthopedics;  Laterality: Left;  . Foot surgery Right 2010    right foot "attempted reconstruction" per pt  . Knee arthroscopy Right 10/08/2014    Procedure: ARTHROSCOPY KNEE;  Surgeon: Vickey Huger, MD;  Location: [place];  Service: Orthopedics;  Laterality: Right;  . Quadriceps tendon repair Right 10/08/2014    Procedure: REPAIR QUADRICEP TENDON;  Surgeon: Vickey Huger, MD;  Location: [place];  Service: Orthopedics;  Laterality: Right;    There were no vitals filed for this visit.  Visit Diagnosis:  Weakness of right lower extremity  Decreased mobility and endurance      Subjective Assessment -  01/14/15 0941    Subjective Pt repots that everything has been going ok. Pt reports that he was able to perform yard work yesterday without issue. Pt goes to see MD tomorrow    Limitations --  None   Currently in Pain? No/denies   Pain Score 0-No pain   Pain Location Knee   Pain Orientation Right                         [facility] Adult PT Treatment/Exercise - 01/14/15 0001    Ambulation/Gait   Stairs Yes   Stairs Assistance 4: Min guard   Stair Management Technique One rail Right;One rail Left;Alternating pattern;Step to pattern   Number of Stairs 12   Height of Stairs 6   Gait Comments x2, therapist support RLE, alternating pattern acceding   Knee/Hip Exercises: Aerobic   Recumbent Bike x6 min, L 0    Knee/Hip Exercises: Machines for Strengthening   Cybex Knee Extension #15 eccentrics 5 reps, 5 reps, 5 reps RLE only     Therapist assist    Cybex Leg Press 60# BIL, green ball squeeze 2 x 12 reps, 40# SL 5 x 2 reps   Knee/Hip Exercises: Seated  Sit to Sand 10 reps;with UE support  6 reps completed no UE support    Knee/Hip Exercises: Supine   Short Arc Quad Sets 2 sets;10 reps  with bio feed back                   PT Short Term Goals - 12/18/14 2119    PT SHORT TERM GOAL #1   Title independent with initial HEP   Status Achieved           PT Long Term Goals - 01/14/15 1018    PT LONG TERM GOAL #1   Title play 9 holes of golf   Status Achieved   PT LONG TERM GOAL #2   Title increase AROM of the right knee to 5-115 degrees flexion   Status On-going   PT LONG TERM GOAL #3   Title able to do SLR without quad lag               Plan - 01/14/15 1016    Clinical Impression Statement Pt 10 minutes late for PT treatment. Pt continues to have difficulty with stair negotiation. Pt hesitant when controlling descents with RLE, therapist needed to support RLE below knee. Pt continues to have difficulty with terminal knee extension against gravity  with open chain exercises. Pt does show good effort in PT.   Pt will benefit from skilled therapeutic intervention in order to improve on the following deficits Abnormal gait;Decreased endurance;Decreased range of motion;Decreased mobility;Decreased strength;Difficulty walking;Pain   PT Frequency 2x / week   PT Duration 8 weeks   PT Treatment/Interventions Electrical Stimulation;Cryotherapy;Ultrasound;Gait training;Balance training;Therapeutic exercise;Functional mobility training;Stair training;Patient/family education;Manual techniques   PT Next Visit Plan continue stair training, glute strengthening, quad strengthening, balance training        Problem List Patient Active Problem List   Diagnosis Date Noted  . Atopic dermatitis 11/25/2012  . Preop cardiovascular exam 04/14/2011  . Hypertension 02/26/2011  . Hyperlipidemia 02/26/2011  . Diabetes mellitus 02/26/2011  . Osteoarthritis 02/26/2011  . GE reflux 02/26/2011  . Erectile dysfunction 02/26/2011   PHYSICAL THERAPY DISCHARGE SUMMARY  Visits from Start of Care: 16  Plan: Patient agrees to discharge.  Patient goals were partially met. Patient is being discharged due to being pleased with the current functional level.  ?????        [name] 01/14/2015, 10:20 AM  [place] [place] [address] [place], [place], [address] Phone: [phone]   Fax:  [phone]

## 2015-01-29 ENCOUNTER — Telehealth: Payer: Self-pay | Admitting: Internal Medicine

## 2015-01-29 MED ORDER — HYDROCODONE-ACETAMINOPHEN 10-325 MG PO TABS: 1.0000 | ORAL_TABLET | Freq: Three times a day (TID) | ORAL | Status: DC

## 2015-01-29 NOTE — Telephone Encounter (Signed)
Wants refill on hydrocodone/APAP 10/325. Last refilled August 2. Date for September 2.

## 2015-01-29 NOTE — Telephone Encounter (Signed)
Will print up Rx for 02/01/15

## 2015-01-29 NOTE — Telephone Encounter (Signed)
Patient calling for his Hydrocodone 10-325 Rx.  Last filled 8/2.  Michela Pitcher he wanted to pick it up on Thursday.  Advised that Dr Renold Genta is out of the office until Thursday.  Michela Pitcher he could pick it up on Thursday.  However, it should be actually be DATED for Friday, 9/2 since that is when it was last filled.

## 2015-02-15 DIAGNOSIS — S81809A Unspecified open wound, unspecified lower leg, initial encounter: Secondary | ICD-10-CM

## 2015-02-22 ENCOUNTER — Telehealth: Payer: Self-pay | Admitting: Internal Medicine

## 2015-02-22 NOTE — Telephone Encounter (Signed)
[place] He struck his left lower leg with an air blower. He is concerned about developing cellulitis and forgot to bring his antibody from home. Call in [place] 500 mg #10 one by mouth daily 2 drugstore at [facility]. [phone].

## 2015-03-12 ENCOUNTER — Other Ambulatory Visit: Payer: Self-pay | Admitting: *Deleted

## 2015-03-12 ENCOUNTER — Telehealth: Payer: Self-pay | Admitting: Internal Medicine

## 2015-03-12 MED ORDER — HYDROCODONE-ACETAMINOPHEN 10-325 MG PO TABS: 1.0000 | ORAL_TABLET | Freq: Three times a day (TID) | ORAL | Status: DC

## 2015-03-12 NOTE — Telephone Encounter (Signed)
Calling to request his refill on Norco 10-325.  His [facility] is the [facility].  Please call him when ready to pick up.  Thanks.

## 2015-03-12 NOTE — Telephone Encounter (Signed)
Rx printed

## 2015-03-12 NOTE — Telephone Encounter (Signed)
Refill once 

## 2015-03-12 NOTE — Telephone Encounter (Signed)
Rx printed for Hydrocodone

## 2015-04-01 ENCOUNTER — Other Ambulatory Visit: Payer: Self-pay | Admitting: Internal Medicine

## 2015-04-01 ENCOUNTER — Other Ambulatory Visit: Payer: Self-pay

## 2015-04-01 MED ORDER — ZOLPIDEM TARTRATE 10 MG PO TABS: 10.0000 mg | ORAL_TABLET | Freq: Every evening | ORAL | Status: DC | PRN

## 2015-04-01 NOTE — Telephone Encounter (Signed)
Refill x 6 months 

## 2015-04-08 ENCOUNTER — Other Ambulatory Visit: Payer: Self-pay

## 2015-04-08 MED ORDER — METFORMIN HCL 500 MG PO TABS: ORAL_TABLET | ORAL | Status: DC

## 2015-04-08 MED ORDER — LOSARTAN POTASSIUM 100 MG PO TABS: ORAL_TABLET | ORAL | Status: DC

## 2015-06-04 ENCOUNTER — Ambulatory Visit (INDEPENDENT_AMBULATORY_CARE_PROVIDER_SITE_OTHER): Payer: Medicare Other | Admitting: Internal Medicine

## 2015-06-04 ENCOUNTER — Encounter: Payer: Self-pay | Admitting: Internal Medicine

## 2015-06-04 VITALS — BP 128/60 | HR 87 | Temp 98.2°F | Wt 198.0 lb

## 2015-06-04 DIAGNOSIS — G4700 Insomnia, unspecified: Secondary | ICD-10-CM | POA: Diagnosis not present

## 2015-06-04 DIAGNOSIS — M79671 Pain in right foot: Secondary | ICD-10-CM

## 2015-06-04 DIAGNOSIS — G8929 Other chronic pain: Secondary | ICD-10-CM | POA: Diagnosis not present

## 2015-06-04 DIAGNOSIS — Z23 Encounter for immunization: Secondary | ICD-10-CM

## 2015-06-05 ENCOUNTER — Telehealth: Payer: Self-pay | Admitting: Internal Medicine

## 2015-06-05 NOTE — Telephone Encounter (Signed)
Appointment made with Dr. Katy Apo at [facility] @ [phone] for patient.  Dx:  Floaters in Left eye.  Patient normal doctor is Dr. Kathrin Penner who was on call today (06/05/15) but appointment was made too late for patient to be seen today.  So, appointment made with ON CALL doctor for next available day.    [facility] for Thursday, 06/06/15 @ 0930.  Patient contacted and made aware of this information.  Patient verbalized understanding of this information.

## 2015-06-29 NOTE — Patient Instructions (Addendum)
Continue hydrocodone/APAP as previously prescribed. Continue Ambien. Prevnar immunization given today

## 2015-06-29 NOTE — Progress Notes (Signed)
   Subjective:    Patient ID: Roberto Reid, male    DOB: Dec 09, 1932, 82 y.o.   MRN: [id number]  HPI He is on chronic hydrocodone/APAP therapy for some time nail. He has a significant foot problem that is not amenable to surgery. Generally takes 3 hydrocodone/APAP tablets daily. I referred him to pain management clinic. He was told he needed a sleep apnea evaluation. He was really not anxious to do that. He said position seem to get a bit irritated about that. He really does not want to return to pain clinic. Says pain management physician was willing to give him hydrocodone/APAP if he would do sleep evaluation and some other suggested things. He would really just like for me to write narcotic medication for him. Sounds like the pain management physician agreed with chronic narcotic therapy.  History of right second metatarsalgia secondary to Morton's foot. History of old Lisfranc fracture subluxation with instability of first and second tarsometatarsal joints causing chronic pain.  Long-standing history of insomnia treated with Ambien  Review of Systems     Objective:   Physical Exam  Not examined. Spent 20 minutes speaking with him about this issue and his concerns      Assessment & Plan:  Chronic pain right foot requiring narcotic medication  Insomnia  Plan: At his age were probably not going to convince him to have a sleep study. Continue chronic narcotic medication as previously prescribed and chronic Ambien therapy.

## 2015-07-04 ENCOUNTER — Other Ambulatory Visit: Payer: Self-pay | Admitting: Internal Medicine

## 2015-07-23 DIAGNOSIS — E559 Vitamin D deficiency, unspecified: Secondary | ICD-10-CM | POA: Diagnosis not present

## 2015-07-23 DIAGNOSIS — R7989 Other specified abnormal findings of blood chemistry: Secondary | ICD-10-CM | POA: Diagnosis not present

## 2015-07-23 DIAGNOSIS — E785 Hyperlipidemia, unspecified: Secondary | ICD-10-CM | POA: Diagnosis not present

## 2015-07-23 DIAGNOSIS — R5383 Other fatigue: Secondary | ICD-10-CM | POA: Diagnosis not present

## 2015-08-08 ENCOUNTER — Telehealth: Payer: Self-pay | Admitting: Internal Medicine

## 2015-08-08 MED ORDER — HYDROCODONE-ACETAMINOPHEN 10-325 MG PO TABS: 1.0000 | ORAL_TABLET | Freq: Three times a day (TID) | ORAL | Status: DC

## 2015-08-08 NOTE — Telephone Encounter (Signed)
Requesting a refill on his Norco pain medication.  Would like to pick this up on Friday please.   Thank you.

## 2015-08-08 NOTE — Telephone Encounter (Signed)
Refill once 

## 2015-08-08 NOTE — Telephone Encounter (Signed)
Printed for signature

## 2015-08-12 ENCOUNTER — Other Ambulatory Visit: Payer: Self-pay

## 2015-08-12 MED ORDER — SILDENAFIL CITRATE 100 MG PO TABS: 100.0000 mg | ORAL_TABLET | ORAL | Status: DC

## 2015-08-12 NOTE — Telephone Encounter (Signed)
Patient walked into office asking for refill.

## 2015-08-15 DIAGNOSIS — Z96652 Presence of left artificial knee joint: Secondary | ICD-10-CM | POA: Diagnosis not present

## 2015-08-15 DIAGNOSIS — M25562 Pain in left knee: Secondary | ICD-10-CM | POA: Diagnosis not present

## 2015-08-19 ENCOUNTER — Other Ambulatory Visit: Payer: Medicare Other | Admitting: Internal Medicine

## 2015-08-19 ENCOUNTER — Telehealth: Payer: Self-pay

## 2015-08-19 DIAGNOSIS — I10 Essential (primary) hypertension: Secondary | ICD-10-CM | POA: Diagnosis not present

## 2015-08-19 DIAGNOSIS — Z125 Encounter for screening for malignant neoplasm of prostate: Secondary | ICD-10-CM | POA: Diagnosis not present

## 2015-08-19 DIAGNOSIS — N529 Male erectile dysfunction, unspecified: Secondary | ICD-10-CM

## 2015-08-19 DIAGNOSIS — E119 Type 2 diabetes mellitus without complications: Secondary | ICD-10-CM | POA: Diagnosis not present

## 2015-08-19 DIAGNOSIS — E785 Hyperlipidemia, unspecified: Secondary | ICD-10-CM | POA: Diagnosis not present

## 2015-08-19 DIAGNOSIS — Z0000 Encounter for general adult medical examination without abnormal findings: Secondary | ICD-10-CM

## 2015-08-19 DIAGNOSIS — Z79899 Other long term (current) drug therapy: Secondary | ICD-10-CM

## 2015-08-19 LAB — CBC WITH DIFFERENTIAL/PLATELET
BASOS PCT: 1 % (ref 0–1)
Basophils Absolute: 0 10*3/uL (ref 0.0–0.1)
Eosinophils Absolute: 0.2 10*3/uL (ref 0.0–0.7)
Eosinophils Relative: 4 % (ref 0–5)
HEMATOCRIT: 39.9 % (ref 39.0–52.0)
HEMOGLOBIN: 13.5 g/dL (ref 13.0–17.0)
Lymphocytes Relative: 32 % (ref 12–46)
Lymphs Abs: 1.5 10*3/uL (ref 0.7–4.0)
MCH: 31.8 pg (ref 26.0–34.0)
MCHC: 33.8 g/dL (ref 30.0–36.0)
MCV: 93.9 fL (ref 78.0–100.0)
MPV: 10.6 fL (ref 8.6–12.4)
Monocytes Absolute: 0.4 10*3/uL (ref 0.1–1.0)
Monocytes Relative: 8 % (ref 3–12)
NEUTROS ABS: 2.6 10*3/uL (ref 1.7–7.7)
Neutrophils Relative %: 55 % (ref 43–77)
Platelets: 227 10*3/uL (ref 150–400)
RBC: 4.25 MIL/uL (ref 4.22–5.81)
RDW: 13.2 % (ref 11.5–15.5)
WBC: 4.7 10*3/uL (ref 4.0–10.5)

## 2015-08-19 LAB — LIPID PANEL
CHOL/HDL RATIO: 3.9 ratio (ref <=5.0)
Cholesterol: 120 mg/dL — ABNORMAL LOW (ref 125–200)
HDL: 31 mg/dL — ABNORMAL LOW (ref >=40)
LDL Cholesterol: 51 mg/dL (ref <130)
Triglycerides: 189 mg/dL — ABNORMAL HIGH (ref <150)
VLDL: 38 mg/dL — ABNORMAL HIGH (ref <30)

## 2015-08-19 LAB — COMPLETE METABOLIC PANEL WITH GFR
ALBUMIN: 4.1 g/dL (ref 3.6–5.1)
ALT: 21 U/L (ref 9–46)
AST: 17 U/L (ref 10–35)
Alkaline Phosphatase: 36 U/L — ABNORMAL LOW (ref 40–115)
BILIRUBIN TOTAL: 1 mg/dL (ref 0.2–1.2)
BUN: 29 mg/dL — ABNORMAL HIGH (ref 7–25)
CO2: 27 mmol/L (ref 20–31)
Calcium: 9 mg/dL (ref 8.6–10.3)
Chloride: 104 mmol/L (ref 98–110)
Creat: 1.47 mg/dL — ABNORMAL HIGH (ref 0.70–1.11)
GFR, Est African American: 51 mL/min — ABNORMAL LOW (ref >=60)
GFR, Est Non African American: 44 mL/min — ABNORMAL LOW (ref >=60)
GLUCOSE: 136 mg/dL — AB (ref 65–99)
POTASSIUM: 4.5 mmol/L (ref 3.5–5.3)
SODIUM: 141 mmol/L (ref 135–146)
Total Protein: 6.4 g/dL (ref 6.1–8.1)

## 2015-08-19 LAB — HEMOGLOBIN A1C
HEMOGLOBIN A1C: 6.2 % — AB (ref <5.7)
Mean Plasma Glucose: 131 mg/dL — ABNORMAL HIGH (ref <117)

## 2015-08-19 NOTE — Telephone Encounter (Signed)
We had sent a prescription refill to [facility] for 100mg  Viagra tab #6 which medicare part D does not cover. I tried doing a PA and they denied it stating that it is an excluded drug. I contacted [facility] which stated that the patient was offered 20mg  tabs for him to take 5 at a time which the patient declined stating that it did not work for him. He picked up #6 tabs which costed him $318.91. He told the pharmacy he would check with the manufacturer to see if they would be any cheaper. He will call the office if he needs a written prescription.

## 2015-08-20 ENCOUNTER — Other Ambulatory Visit: Payer: Self-pay

## 2015-08-20 LAB — PSA: PSA: 0.44 ng/mL (ref <=4.00)

## 2015-08-20 MED ORDER — ZOLPIDEM TARTRATE 10 MG PO TABS: 10.0000 mg | ORAL_TABLET | Freq: Every evening | ORAL | Status: DC | PRN

## 2015-08-20 NOTE — Telephone Encounter (Signed)
Phoned to pharmacy 

## 2015-08-23 ENCOUNTER — Ambulatory Visit (INDEPENDENT_AMBULATORY_CARE_PROVIDER_SITE_OTHER): Payer: Medicare Other | Admitting: Internal Medicine

## 2015-08-23 ENCOUNTER — Encounter: Payer: Self-pay | Admitting: Internal Medicine

## 2015-08-23 VITALS — BP 138/84 | HR 90 | Temp 98.0°F | Resp 18 | Ht 71.0 in | Wt 200.0 lb

## 2015-08-23 DIAGNOSIS — N529 Male erectile dysfunction, unspecified: Secondary | ICD-10-CM

## 2015-08-23 DIAGNOSIS — Z87891 Personal history of nicotine dependence: Secondary | ICD-10-CM

## 2015-08-23 DIAGNOSIS — Z8719 Personal history of other diseases of the digestive system: Secondary | ICD-10-CM | POA: Diagnosis not present

## 2015-08-23 DIAGNOSIS — E785 Hyperlipidemia, unspecified: Secondary | ICD-10-CM | POA: Diagnosis not present

## 2015-08-23 DIAGNOSIS — G4700 Insomnia, unspecified: Secondary | ICD-10-CM | POA: Diagnosis not present

## 2015-08-23 DIAGNOSIS — Z720 Tobacco use: Secondary | ICD-10-CM | POA: Diagnosis not present

## 2015-08-23 DIAGNOSIS — H9193 Unspecified hearing loss, bilateral: Secondary | ICD-10-CM | POA: Diagnosis not present

## 2015-08-23 DIAGNOSIS — E291 Testicular hypofunction: Secondary | ICD-10-CM

## 2015-08-23 DIAGNOSIS — K219 Gastro-esophageal reflux disease without esophagitis: Secondary | ICD-10-CM

## 2015-08-23 DIAGNOSIS — E669 Obesity, unspecified: Secondary | ICD-10-CM

## 2015-08-23 DIAGNOSIS — E8881 Metabolic syndrome: Secondary | ICD-10-CM

## 2015-08-23 DIAGNOSIS — I10 Essential (primary) hypertension: Secondary | ICD-10-CM | POA: Diagnosis not present

## 2015-08-23 DIAGNOSIS — Z87442 Personal history of urinary calculi: Secondary | ICD-10-CM | POA: Diagnosis not present

## 2015-08-23 DIAGNOSIS — Z86010 Personal history of colonic polyps: Secondary | ICD-10-CM

## 2015-08-23 DIAGNOSIS — Z0000 Encounter for general adult medical examination without abnormal findings: Secondary | ICD-10-CM

## 2015-08-23 DIAGNOSIS — G8929 Other chronic pain: Secondary | ICD-10-CM

## 2015-08-23 DIAGNOSIS — M79671 Pain in right foot: Secondary | ICD-10-CM | POA: Diagnosis not present

## 2015-08-23 DIAGNOSIS — R7989 Other specified abnormal findings of blood chemistry: Secondary | ICD-10-CM

## 2015-08-23 DIAGNOSIS — Z860101 Personal history of adenomatous and serrated colon polyps: Secondary | ICD-10-CM

## 2015-08-23 DIAGNOSIS — E119 Type 2 diabetes mellitus without complications: Secondary | ICD-10-CM

## 2015-08-23 DIAGNOSIS — F5104 Psychophysiologic insomnia: Secondary | ICD-10-CM

## 2015-08-23 LAB — POCT URINALYSIS DIPSTICK
Bilirubin, UA: NEGATIVE
Glucose, UA: NEGATIVE
Ketones, UA: NEGATIVE
Leukocytes, UA: NEGATIVE
NITRITE UA: NEGATIVE
PH UA: 7.5
PROTEIN UA: NEGATIVE
RBC UA: NEGATIVE
SPEC GRAV UA: 1.025
Urobilinogen, UA: 0.2

## 2015-08-23 MED ORDER — HYDROCODONE-ACETAMINOPHEN 10-325 MG PO TABS: 1.0000 | ORAL_TABLET | Freq: Three times a day (TID) | ORAL | Status: DC

## 2015-08-24 LAB — MICROALBUMIN, URINE: Microalb, Ur: 0.3 mg/dL

## 2015-08-30 NOTE — Patient Instructions (Signed)
Patient wants to consider nerve block right foot. He will continue hydrocodone/APAP for chronic pain at present time. Lab work is within normal limits. His diabetes is under fairly good control. Viagra needs to be refilled. Return in 6 months.

## 2015-08-30 NOTE — Progress Notes (Signed)
Subjective:    Patient ID: Roberto Reid, male    DOB: 1932/10/13, 82 y.o.   MRN: [id number]  HPI 82 year old White male in today for health maintenance exam and evaluation of medical problems. He has a history of essential hypertension, controlled type 2 diabetes, low testosterone, erectile dysfunction. History of left knee replacement July 2013. History of recurrent cellulitis right leg related to an apparent spider bite 2008. Right toe crosses over second toe. He says he has a broken pin in that foot that did not heal well postoperatively. He has to take chronic pain medication for that reason. He takes around to [place] on a Pap's daily. We sent him to pain clinic for evaluation. He says they agreed with him taking chronic pain medication. He went to see Dr. Ouida Sills in [place] regarding surgery on this foot, and Dr. Ouida Sills indicated he did not think it would be wise to operate on it. Patient has considered having a nerve block of the foot since it is so painful. He has right second metatarsalgia secondary to Morton's foot. History of old Lisfranc fracture subluxation with instability of first and second tarsometatarsal joints causing chronic pain.  Fractured ankle 1953 secondary to football injury. Fractured ribs 1915 1956.  Remote history of cluster headaches seen by Dr. Erling Cruz 2001.  Allergy skin testing done 2010 showing positive test to cat and some molds. Minimal reactivity to dog. Spirometry was normal.  History of dyshidrotic hand eczema.  Has been evaluated in the past by Dr. Lia Foyer, cardiologist. Cardiolite study 2008 was negative. History of loss. Hearing test in 2010 revealed mild to moderate high-frequency hearing loss bilaterally.  History of Barrett's esophagus and adenomatous colon polyp 2008. Right kidney stones 2007 and 1997. Surgery to repair fractured arm 1942. Kidney stone removed 1997. Reported history of uric acid stones 2007 treated by Dr. Jeffie Pollock.  History of GE reflux  and osteoarthritis.  He had cataract extraction both eyes November 2010. Left knee surgery done by Dr. Onnie Graham for torn cartilage November 2005. He was hospitalized in [place] September 2011 with right leg cellulitis and treated with IV antibiotics.  Social history: He has been married twice. He is a nonsmoker presently but formerly smoked and quit in 1990 having smoked pack a day for 32 years. Social alcohol consumption. 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  Family history: Father died at age 1 with Parkinson's disease. Father had history of diabetes. Mother died at age 45 of unknown causes. 2 sisters in good health. A son and daughter in good health.  Last Spring while on a trip to [place], he suffered a fall down some marble steps in [place] after slipping on them. He suffered a right quadriceps tendon rupture and a right knee torn medial meniscus which was repaired by Dr. Ronnie Derby in May 2016. He had considerable rehabilitation and has taken some time to get over that injury.  Review of Systems chronic right foot pain which is aggravating to him requiring chronic narcotic medication.     Objective:   Physical Exam  Constitutional: He is oriented to person, place, and time. He appears well-developed and well-nourished. No distress.  HENT:  Head: Normocephalic and atraumatic.  Right Ear: External ear normal.  Left Ear: External ear normal.  Eyes: Conjunctivae and EOM are normal. Pupils are equal, round, and reactive to light.  Right eye exhibits no discharge. Left eye exhibits no discharge.  Neck: No thyromegaly present.  No bruits  Cardiovascular: Normal rate, regular rhythm, normal heart sounds and intact distal pulses.   No murmur heard. Pulmonary/Chest: Effort normal and breath sounds normal. He has no wheezes. He has no rales.  Abdominal: Soft. Bowel sounds are normal. He  exhibits no mass. There is no tenderness. There is no rebound.  Genitourinary: Prostate normal.  Musculoskeletal: He exhibits no edema.  Chronic right foot deformity. Great toe crosses over second toe  Lymphadenopathy:    He has no cervical adenopathy.  Neurological: He is alert and oriented to person, place, and time. He has normal reflexes. No cranial nerve deficit. Coordination normal.  Skin: Skin is warm and dry. No rash noted. He is not diaphoretic.  Psychiatric: He has a normal mood and affect. His behavior is normal. Judgment and thought content normal.  Vitals reviewed.         Assessment & Plan:  Chronic right foot pain  Chronic insomnia  Bilateral hearing loss  Controlled type 2 diabetes  Hyperlipidemia  Metabolic syndrome  Obesity  Low testosterone  GE reflux  History of Barrett's esophagus  History of adenomatous colon polyps  Erectile dysfunction  History of kidney stones  Plan: Consider nerve block right foot. Make consider second opinion regarding right foot surgery. Return in 6 months. Needs twice a day hydrocodone/APAP for pain control. Refill Ambien. Refill Viagra.  Subjective:   Patient presents for Medicare Annual/Subsequent preventive examination.  Review Past Medical/Family/Social:See above   Risk Factors  Current exercise habits: Not a lot of exercise. Plays some golf. Dietary issues discussed: Low fat low carbohydrate  Cardiac risk factors:Hypertension, hyperlipidemia ,diabetes mellitus  Depression Screen  (Note: if answer to either of the following is "Yes", a more complete depression screening is indicated)   Over the past two weeks, have you felt down, depressed or hopeless? No  Over the past two weeks, have you felt little interest or pleasure in doing things? No Have you lost interest or pleasure in daily life? No Do you often feel hopeless? No Do you cry easily over simple problems? No   Activities of Daily Living  In  your present state of health, do you have any difficulty performing the following activities?:   Driving? No  Managing money? No  Feeding yourself? No  Getting from bed to chair? No  Climbing a flight of stairs? Yes due to foot pain Preparing food and eating?: No  Bathing or showering? No  Getting dressed: No  Getting to the toilet? No  Using the toilet:No  Moving around from place to place: No  In the past year have you fallen or had a near fall?:No  Are you sexually active? yes Do you have more than one partner? No   Hearing Difficulties: No  Do you often ask people to speak up or repeat themselves? No  Do you experience ringing or noises in your ears? No  Do you have difficulty understanding soft or whispered voices? Yes Do you feel that you have a problem with memory? No Do you often misplace items? No    Home Safety:  Do you have a smoke alarm at your residence? Yes Do you have grab bars in the bathroom?No Do you have throw rugs in your house? Yes   Cognitive Testing  Alert? Yes Normal Appearance?Yes  Oriented to person? Yes Place? Yes  Time? Yes  Recall of three objects? Yes  Can perform simple calculations? Yes  Displays appropriate judgment?Yes  Can read the correct time from a watch face?Yes   List the Names of Other Physician/Practitioners you currently use:  See referral list for the physicians patient is currently seeing.    Review of Systems: See above  Objective:     General appearance: Appears younger than stated age and mildly obese  Head: Normocephalic, without obvious abnormality, atraumatic  Eyes: conj clear, EOMi PEERLA  Ears: normal TM's and external ear canals both ears  Nose: Nares normal. Septum midline. Mucosa normal. No drainage or sinus tenderness.  Throat: lips, mucosa, and tongue normal; teeth and gums normal  Neck: no adenopathy, no carotid bruit, no JVD, supple, symmetrical, trachea midline and thyroid not enlarged, symmetric, no  tenderness/mass/nodules  No CVA tenderness.  Lungs: clear to auscultation bilaterally  Breasts: normal appearance, no masses or tenderness Heart: regular rate and rhythm, S1, S2 normal, no murmur, click, rub or gallop  Abdomen: soft, non-tender; bowel sounds normal; no masses, no organomegaly  Musculoskeletal: ROM normal in all joints, no crepitus, no deformity, Normal muscle strengthen. Back  is symmetric, no curvature. Skin: Skin color, texture, turgor normal. No rashes or lesions  Lymph nodes: Cervical, supraclavicular, and axillary nodes normal.  Neurologic: CN 2 -12 Normal, Normal symmetric reflexes. Normal coordination and gait  Psych: Alert & Oriented x 3, Mood appear stable.    Assessment:    Annual wellness medicare exam   Plan:    During the course of the visit the patient was educated and counseled about appropriate screening and preventive services including:        Patient Instructions (the written plan) was given to the patient.  Medicare Attestation  I have personally reviewed:  The patient's medical and social history  Their use of alcohol, tobacco or illicit drugs  Their current medications and supplements  The patient's functional ability including ADLs,fall risks, home safety risks, cognitive, and hearing and visual impairment  Diet and physical activities  Evidence for depression or mood disorders  The patient's weight, height, BMI, and visual acuity have been recorded in the chart. I have made referrals, counseling, and provided education to the patient based on review of the above and I have provided the patient with a written personalized care plan for preventive services.

## 2015-09-03 DIAGNOSIS — R7989 Other specified abnormal findings of blood chemistry: Secondary | ICD-10-CM | POA: Diagnosis not present

## 2015-09-03 DIAGNOSIS — R5383 Other fatigue: Secondary | ICD-10-CM | POA: Diagnosis not present

## 2015-09-11 ENCOUNTER — Telehealth: Payer: Self-pay | Admitting: Internal Medicine

## 2015-09-11 NOTE — Telephone Encounter (Signed)
Looking to get a nerve block in his right foot.  He called back to get an appointment with Dr. Hardin Negus regarding an appointment for consultation.  He was told that he would not be given an appointment with Dr. Hardin Negus and they hung up on him.  Patient is very upset in this regard.  He feels this is very unprofessional and he should've been able to have made an appointment with Dr. Hardin Negus even though he didn't have a good experience with Dr. Lennette Bihari in the same clinic.    Patient DID receive the information regarding [facility] that you sent to him.  He has an appointment in June.    He wanted to make you aware of the [facility] issue.

## 2015-09-12 NOTE — Telephone Encounter (Signed)
I called and spoke with Dr. Nicholaus Bloom personally regarding pt request for nerve block. He will call Mr. Melber.

## 2015-09-18 NOTE — Telephone Encounter (Signed)
Called [facility] to make him aware to expect a call from Dr. Hardin Negus.  Roberto Reid was appreciative.

## 2015-09-24 ENCOUNTER — Telehealth: Payer: Self-pay | Admitting: Internal Medicine

## 2015-09-24 NOTE — Telephone Encounter (Signed)
Patient notified

## 2015-09-24 NOTE — Telephone Encounter (Signed)
Left message for return call.

## 2015-09-24 NOTE — Telephone Encounter (Signed)
I have spoke to patient and he states that 10mg  Lorrin Mais is not helping him anymore. He is waking up at 3:00am and cannot get back to sleep. HE is under a lot of stress and he is sure this is just temporary. He states that anything additional is appreciated.

## 2015-09-24 NOTE — Telephone Encounter (Signed)
Cannot give anything additional.

## 2015-09-24 NOTE — Telephone Encounter (Signed)
Roberto Reid called saying he takes 10MG  of Ambien every night to go to sleep. He's wondering if a Rx can be prescribed for him. Please give him a call.  Pt's ph#  [phone]  Thank you.

## 2015-10-08 ENCOUNTER — Telehealth: Payer: Self-pay | Admitting: Internal Medicine

## 2015-10-08 MED ORDER — HYDROCODONE-ACETAMINOPHEN 10-325 MG PO TABS: 1.0000 | ORAL_TABLET | Freq: Three times a day (TID) | ORAL | Status: DC

## 2015-10-08 NOTE — Telephone Encounter (Signed)
Please have ready for this afternoon

## 2015-10-08 NOTE — Telephone Encounter (Signed)
Calling for a refill on his Norco 10-325mg  please.  He'd like to pick this Rx up this afternoon if at all possible.  If not, he'll get it tomorrow, but he is out running around this afternoon and would like to drop by and pick it up if at all possible.   Thank you.

## 2015-10-08 NOTE — Telephone Encounter (Signed)
Printed for signature. Patient notified.

## 2015-10-14 ENCOUNTER — Encounter: Payer: Self-pay | Admitting: Internal Medicine

## 2015-10-14 ENCOUNTER — Telehealth: Payer: Self-pay | Admitting: Internal Medicine

## 2015-10-14 MED ORDER — ATORVASTATIN CALCIUM 80 MG PO TABS: 80.0000 mg | ORAL_TABLET | Freq: Every day | ORAL | Status: DC

## 2015-10-14 NOTE — Telephone Encounter (Signed)
Refill Lipitor for one year to pharmacy

## 2015-11-06 ENCOUNTER — Telehealth: Payer: Self-pay | Admitting: Internal Medicine

## 2015-11-06 DIAGNOSIS — M79671 Pain in right foot: Secondary | ICD-10-CM | POA: Diagnosis not present

## 2015-11-06 DIAGNOSIS — M2011 Hallux valgus (acquired), right foot: Secondary | ICD-10-CM | POA: Diagnosis not present

## 2015-11-06 DIAGNOSIS — M2141 Flat foot [pes planus] (acquired), right foot: Secondary | ICD-10-CM | POA: Diagnosis not present

## 2015-11-06 DIAGNOSIS — M216X1 Other acquired deformities of right foot: Secondary | ICD-10-CM | POA: Insufficient documentation

## 2015-11-06 DIAGNOSIS — Z981 Arthrodesis status: Secondary | ICD-10-CM | POA: Diagnosis not present

## 2015-11-06 DIAGNOSIS — M205X1 Other deformities of toe(s) (acquired), right foot: Secondary | ICD-10-CM | POA: Diagnosis not present

## 2015-11-06 DIAGNOSIS — T849XXA Unspecified complication of internal orthopedic prosthetic device, implant and graft, initial encounter: Secondary | ICD-10-CM | POA: Diagnosis not present

## 2015-11-06 NOTE — Telephone Encounter (Signed)
Patient is calling for his refill on Norco 10-325mg .  Would like to pick this up on Thursday afternoon please.  Thank you.

## 2015-11-07 MED ORDER — HYDROCODONE-ACETAMINOPHEN 10-325 MG PO TABS: 1.0000 | ORAL_TABLET | Freq: Three times a day (TID) | ORAL | Status: DC

## 2015-11-07 NOTE — Telephone Encounter (Signed)
Refill once 

## 2015-11-07 NOTE — Telephone Encounter (Signed)
Printed for signature- pt notified.

## 2015-11-12 ENCOUNTER — Encounter: Payer: Self-pay | Admitting: Internal Medicine

## 2015-11-12 ENCOUNTER — Ambulatory Visit (INDEPENDENT_AMBULATORY_CARE_PROVIDER_SITE_OTHER): Payer: Medicare Other | Admitting: Internal Medicine

## 2015-11-12 VITALS — BP 144/70 | HR 99 | Temp 97.3°F | Resp 18 | Wt 196.0 lb

## 2015-11-12 DIAGNOSIS — M216X1 Other acquired deformities of right foot: Secondary | ICD-10-CM

## 2015-11-12 DIAGNOSIS — M2141 Flat foot [pes planus] (acquired), right foot: Secondary | ICD-10-CM

## 2015-11-12 MED ORDER — CLONAZEPAM 0.5 MG PO TABS: 0.5000 mg | ORAL_TABLET | Freq: Two times a day (BID) | ORAL | Status: DC | PRN

## 2015-11-22 ENCOUNTER — Other Ambulatory Visit: Payer: Medicare Other | Admitting: Internal Medicine

## 2015-11-22 DIAGNOSIS — L570 Actinic keratosis: Secondary | ICD-10-CM | POA: Diagnosis not present

## 2015-11-22 DIAGNOSIS — X32XXXD Exposure to sunlight, subsequent encounter: Secondary | ICD-10-CM | POA: Diagnosis not present

## 2015-11-22 DIAGNOSIS — L308 Other specified dermatitis: Secondary | ICD-10-CM | POA: Diagnosis not present

## 2015-11-26 ENCOUNTER — Other Ambulatory Visit: Payer: Medicare Other | Admitting: Internal Medicine

## 2015-11-26 ENCOUNTER — Encounter: Payer: Self-pay | Admitting: Internal Medicine

## 2015-11-26 NOTE — Patient Instructions (Signed)
It was pleasure to see you today. Return as needed.

## 2015-11-26 NOTE — Progress Notes (Signed)
   Subjective:    Patient ID: Roberto Reid, male    DOB: [birth year], 82 y.o.   MRN: [id number]  HPI Patient wanted to come by and give me an update on his situation with right foot. He had surgery at [facility] by Dr. Para March consisting of a percutaneous FDL tenotomy. He says he 80% pain free. He is amazed by how rapidly he improved. He'll need to wear an orthotic in his shoe. He was diagnosed with pes planovalgus, hallux valgus, failure joint fusion and midfoot collapse. X-ray showed first and second TMT arthrodesis with broken screws    Review of Systems     Objective:   Physical Exam  Not examined. The 20 minutes speaking with him about this issue. He is very pleased with the outcome so far.      Assessment & Plan:  Status post percutaneous FDL tenotomy right foot by Dr. Para March at [facility] with success in pain relief  Plan: Return as needed

## 2015-11-29 ENCOUNTER — Telehealth: Payer: Self-pay | Admitting: Internal Medicine

## 2015-11-29 MED ORDER — HYDROCODONE-ACETAMINOPHEN 10-325 MG PO TABS: 1.0000 | ORAL_TABLET | Freq: Three times a day (TID) | ORAL | Status: DC

## 2015-11-29 NOTE — Telephone Encounter (Signed)
Please go ahead and do this now before I leave for the day

## 2015-11-29 NOTE — Telephone Encounter (Signed)
Patient requesting a Norco 10-325mg  refill.  He wants to know if you will POST DATE the Rx for next 7/8 (that's when it is due) and allow him to pick it up on Friday, 7/7 since you will not be here next week??    Please advise and thank you.

## 2015-12-10 ENCOUNTER — Other Ambulatory Visit: Payer: Self-pay | Admitting: Internal Medicine

## 2015-12-13 ENCOUNTER — Other Ambulatory Visit: Payer: Medicare Other | Admitting: Internal Medicine

## 2015-12-13 DIAGNOSIS — I10 Essential (primary) hypertension: Secondary | ICD-10-CM | POA: Diagnosis not present

## 2015-12-13 LAB — BASIC METABOLIC PANEL
BUN: 29 mg/dL — AB (ref 7–25)
CHLORIDE: 104 mmol/L (ref 98–110)
CO2: 27 mmol/L (ref 20–31)
CREATININE: 1.51 mg/dL — AB (ref 0.70–1.11)
Calcium: 8.5 mg/dL — ABNORMAL LOW (ref 8.6–10.3)
GLUCOSE: 110 mg/dL — AB (ref 65–99)
Potassium: 4.7 mmol/L (ref 3.5–5.3)
Sodium: 142 mmol/L (ref 135–146)

## 2015-12-16 ENCOUNTER — Telehealth: Payer: Self-pay

## 2015-12-16 DIAGNOSIS — E119 Type 2 diabetes mellitus without complications: Secondary | ICD-10-CM

## 2015-12-16 DIAGNOSIS — I10 Essential (primary) hypertension: Secondary | ICD-10-CM

## 2015-12-16 DIAGNOSIS — N182 Chronic kidney disease, stage 2 (mild): Secondary | ICD-10-CM

## 2015-12-16 NOTE — Telephone Encounter (Signed)
-----   Message from Elby Showers, MD sent at 12/14/2015  9:15 AM EDT ----- He is developing some mild chronic kidney disease due to age, HTN, and DM. Needs to have renal ultrasound to rule out obstruction of kidneys and we will get him appt with [facility] although it will take 3 months or so. Not to worry.

## 2015-12-16 NOTE — Telephone Encounter (Signed)
Pt notified of labs

## 2015-12-20 ENCOUNTER — Telehealth: Payer: Self-pay | Admitting: Internal Medicine

## 2015-12-20 ENCOUNTER — Other Ambulatory Visit: Payer: Self-pay | Admitting: Internal Medicine

## 2015-12-20 ENCOUNTER — Ambulatory Visit
Admission: RE | Admit: 2015-12-20 | Discharge: 2015-12-20 | Disposition: A | Discharge status: Home / Self Care | Payer: Medicare Other | Source: Ambulatory Visit | Attending: Internal Medicine | Admitting: Internal Medicine

## 2015-12-20 DIAGNOSIS — N189 Chronic kidney disease, unspecified: Secondary | ICD-10-CM | POA: Diagnosis not present

## 2015-12-20 DIAGNOSIS — I10 Essential (primary) hypertension: Secondary | ICD-10-CM

## 2015-12-20 DIAGNOSIS — N182 Chronic kidney disease, stage 2 (mild): Secondary | ICD-10-CM

## 2015-12-20 DIAGNOSIS — I129 Hypertensive chronic kidney disease with stage 1 through stage 4 chronic kidney disease, or unspecified chronic kidney disease: Secondary | ICD-10-CM | POA: Diagnosis not present

## 2015-12-20 DIAGNOSIS — E119 Type 2 diabetes mellitus without complications: Secondary | ICD-10-CM

## 2015-12-20 NOTE — Telephone Encounter (Signed)
To see Urologist Monday about this. Pt to be notified.

## 2015-12-20 NOTE — Telephone Encounter (Signed)
Stacy @ [facility] is calling [phone]) with a report on patient from his U/S today of the kidney.  Report:  No hydonephrosis.  R renal cortex is lobular in contour.  Recommend evaluation with CT or MRI for definitive description of possible renal mass.

## 2015-12-23 DIAGNOSIS — N5201 Erectile dysfunction due to arterial insufficiency: Secondary | ICD-10-CM | POA: Diagnosis not present

## 2015-12-23 DIAGNOSIS — D4111 Neoplasm of uncertain behavior of right renal pelvis: Secondary | ICD-10-CM | POA: Diagnosis not present

## 2015-12-24 ENCOUNTER — Other Ambulatory Visit: Payer: Self-pay | Admitting: Urology

## 2015-12-24 DIAGNOSIS — D4111 Neoplasm of uncertain behavior of right renal pelvis: Secondary | ICD-10-CM

## 2016-01-01 ENCOUNTER — Other Ambulatory Visit: Payer: Self-pay

## 2016-01-01 ENCOUNTER — Ambulatory Visit (HOSPITAL_COMMUNITY)
Admission: RE | Admit: 2016-01-01 | Discharge: 2016-01-01 | Disposition: A | Discharge status: Home / Self Care | Payer: Medicare Other | Source: Ambulatory Visit | Attending: Urology | Admitting: Urology

## 2016-01-01 DIAGNOSIS — D4111 Neoplasm of uncertain behavior of right renal pelvis: Secondary | ICD-10-CM | POA: Diagnosis not present

## 2016-01-01 DIAGNOSIS — N281 Cyst of kidney, acquired: Secondary | ICD-10-CM | POA: Diagnosis not present

## 2016-01-01 DIAGNOSIS — K8020 Calculus of gallbladder without cholecystitis without obstruction: Secondary | ICD-10-CM | POA: Insufficient documentation

## 2016-01-01 DIAGNOSIS — D7389 Other diseases of spleen: Secondary | ICD-10-CM | POA: Insufficient documentation

## 2016-01-01 MED ORDER — GADOBENATE DIMEGLUMINE 529 MG/ML IV SOLN
20.0000 mL | Freq: Once | INTRAVENOUS | Status: AC | PRN
  Administered 2016-01-01: 18 mL via INTRAVENOUS

## 2016-01-02 ENCOUNTER — Other Ambulatory Visit: Payer: Self-pay

## 2016-01-02 MED ORDER — HYDROCODONE-ACETAMINOPHEN 10-325 MG PO TABS: 1.0000 | ORAL_TABLET | Freq: Three times a day (TID) | ORAL | 0 refills | Status: DC

## 2016-01-03 MED ORDER — TRAMADOL HCL 50 MG PO TABS: 50.0000 mg | ORAL_TABLET | Freq: Three times a day (TID) | ORAL | 0 refills | Status: DC | PRN

## 2016-01-03 NOTE — Telephone Encounter (Signed)
Says foot pain has returned. Advised office visit at [facility] with Dr. Para March. Needs hydrocodone refill. Wants to try Tramadol as rescue med. Call in tramadol 50 mg tid #30

## 2016-01-09 ENCOUNTER — Telehealth: Payer: Self-pay | Admitting: Internal Medicine

## 2016-01-09 DIAGNOSIS — Z029 Encounter for administrative examinations, unspecified: Secondary | ICD-10-CM

## 2016-01-09 MED ORDER — AMOXICILLIN-POT CLAVULANATE 500-125 MG PO TABS: 1.0000 | ORAL_TABLET | Freq: Three times a day (TID) | ORAL | 0 refills | Status: AC

## 2016-01-09 NOTE — Telephone Encounter (Signed)
Pt does not want Levaquin saying it makes him "mean". Try Augmentin 500 mg tid x 10 days #30 no refill

## 2016-01-09 NOTE — Telephone Encounter (Signed)
Patient states that he needs an antibiotic for his Left lower leg.  He has used the antibiotic that he keeps on hand for emergency.  He has broken the skin on his left lower leg.  No fever, history of cellulitis.  Patient states he doesn't want Levaquin.    [facility]

## 2016-01-09 NOTE — Telephone Encounter (Signed)
Called patient to inform sent Rx. No answer.

## 2016-01-25 ENCOUNTER — Other Ambulatory Visit: Payer: Self-pay | Admitting: Internal Medicine

## 2016-02-06 ENCOUNTER — Telehealth: Payer: Self-pay | Admitting: Internal Medicine

## 2016-02-06 MED ORDER — HYDROCODONE-ACETAMINOPHEN 10-325 MG PO TABS: 1.0000 | ORAL_TABLET | Freq: Three times a day (TID) | ORAL | 0 refills | Status: DC

## 2016-02-06 NOTE — Telephone Encounter (Signed)
Done

## 2016-02-06 NOTE — Telephone Encounter (Signed)
Calling for a refill on his Norco 10-325mg .  Would like to pick up his Rx tomorrow afternoon please.    Please call him if he is not able to pick this up tomorrow afternoon.  Thank you.

## 2016-02-06 NOTE — Telephone Encounter (Signed)
Refill once 

## 2016-02-18 ENCOUNTER — Other Ambulatory Visit: Payer: Medicare Other | Admitting: Internal Medicine

## 2016-02-18 DIAGNOSIS — R7302 Impaired glucose tolerance (oral): Secondary | ICD-10-CM | POA: Diagnosis not present

## 2016-02-18 DIAGNOSIS — Z0000 Encounter for general adult medical examination without abnormal findings: Secondary | ICD-10-CM | POA: Diagnosis not present

## 2016-02-18 LAB — LIPID PANEL
CHOL/HDL RATIO: 5.3 ratio — AB (ref <=5.0)
Cholesterol: 179 mg/dL (ref 125–200)
HDL: 34 mg/dL — ABNORMAL LOW (ref >=40)
LDL Cholesterol: 103 mg/dL (ref <130)
Triglycerides: 208 mg/dL — ABNORMAL HIGH (ref <150)
VLDL: 42 mg/dL — AB (ref <30)

## 2016-02-18 LAB — BASIC METABOLIC PANEL
BUN: 22 mg/dL (ref 7–25)
CHLORIDE: 104 mmol/L (ref 98–110)
CO2: 28 mmol/L (ref 20–31)
Calcium: 8.9 mg/dL (ref 8.6–10.3)
Creat: 1.43 mg/dL — ABNORMAL HIGH (ref 0.70–1.11)
Glucose, Bld: 129 mg/dL — ABNORMAL HIGH (ref 65–99)
POTASSIUM: 4.9 mmol/L (ref 3.5–5.3)
SODIUM: 139 mmol/L (ref 135–146)

## 2016-02-18 LAB — HEPATIC FUNCTION PANEL
ALBUMIN: 3.9 g/dL (ref 3.6–5.1)
ALK PHOS: 30 U/L — AB (ref 40–115)
ALT: 12 U/L (ref 9–46)
AST: 11 U/L (ref 10–35)
BILIRUBIN DIRECT: 0.1 mg/dL (ref <=0.2)
BILIRUBIN INDIRECT: 0.7 mg/dL (ref 0.2–1.2)
BILIRUBIN TOTAL: 0.8 mg/dL (ref 0.2–1.2)
Total Protein: 6.2 g/dL (ref 6.1–8.1)

## 2016-02-19 LAB — HEMOGLOBIN A1C
HEMOGLOBIN A1C: 5.9 % — AB (ref <5.7)
MEAN PLASMA GLUCOSE: 123 mg/dL

## 2016-02-20 DIAGNOSIS — Z131 Encounter for screening for diabetes mellitus: Secondary | ICD-10-CM | POA: Diagnosis not present

## 2016-02-20 DIAGNOSIS — E559 Vitamin D deficiency, unspecified: Secondary | ICD-10-CM | POA: Diagnosis not present

## 2016-02-20 DIAGNOSIS — R7989 Other specified abnormal findings of blood chemistry: Secondary | ICD-10-CM | POA: Diagnosis not present

## 2016-02-20 DIAGNOSIS — R5383 Other fatigue: Secondary | ICD-10-CM | POA: Diagnosis not present

## 2016-02-20 DIAGNOSIS — E7800 Pure hypercholesterolemia, unspecified: Secondary | ICD-10-CM | POA: Diagnosis not present

## 2016-02-21 ENCOUNTER — Ambulatory Visit (INDEPENDENT_AMBULATORY_CARE_PROVIDER_SITE_OTHER): Payer: Medicare Other | Admitting: Internal Medicine

## 2016-02-21 ENCOUNTER — Encounter: Payer: Self-pay | Admitting: Internal Medicine

## 2016-02-21 VITALS — BP 124/86 | HR 83 | Temp 97.6°F | Wt 199.5 lb

## 2016-02-21 DIAGNOSIS — L259 Unspecified contact dermatitis, unspecified cause: Secondary | ICD-10-CM | POA: Diagnosis not present

## 2016-02-21 DIAGNOSIS — E785 Hyperlipidemia, unspecified: Secondary | ICD-10-CM

## 2016-02-21 DIAGNOSIS — G4700 Insomnia, unspecified: Secondary | ICD-10-CM

## 2016-02-21 DIAGNOSIS — R7302 Impaired glucose tolerance (oral): Secondary | ICD-10-CM

## 2016-02-21 DIAGNOSIS — N182 Chronic kidney disease, stage 2 (mild): Secondary | ICD-10-CM | POA: Diagnosis not present

## 2016-02-21 DIAGNOSIS — G8929 Other chronic pain: Secondary | ICD-10-CM

## 2016-02-21 DIAGNOSIS — E8881 Metabolic syndrome: Secondary | ICD-10-CM

## 2016-02-21 DIAGNOSIS — I10 Essential (primary) hypertension: Secondary | ICD-10-CM | POA: Diagnosis not present

## 2016-02-21 DIAGNOSIS — F5104 Psychophysiologic insomnia: Secondary | ICD-10-CM

## 2016-02-21 DIAGNOSIS — M79673 Pain in unspecified foot: Secondary | ICD-10-CM

## 2016-02-21 MED ORDER — TRIAMCINOLONE ACETONIDE 0.1 % EX CREA: 1.0000 "application " | TOPICAL_CREAM | Freq: Three times a day (TID) | CUTANEOUS | 2 refills | Status: DC

## 2016-02-21 MED ORDER — TRAMADOL HCL 50 MG PO TABS: 50.0000 mg | ORAL_TABLET | Freq: Three times a day (TID) | ORAL | 3 refills | Status: DC | PRN

## 2016-02-21 NOTE — Progress Notes (Signed)
   Subjective:    Patient ID: Roberto Reid, male    DOB: 27-Nov-1932, 82 y.o.   MRN: [id number]  HPI 82 year old for follow up of medical issues including essential hypertension, impaired glucose tolerance, metabolic syndrome, hyperlipidemia, chronic foot pain, chronic kidney disease.  He has developed rash on his arms. He used to use Diprolene for that but dermatologist told him that actually made the problem worse. Doesn't think it's a photosensitivity reaction. Does wear sunscreen. Does not look like a drug reaction. It is itchy and red.  Continues with chronic foot pain. Gets relief with hydrocodone/APAP and tramadol. He says he is trying to cut down on dose of hydrocodone/APAP.    Review of Systems     Objective:   Physical Exam Confluently erythematous rash on dorsal aspect of arms bilaterally up to elbows.  Chest clear to auscultation. Cardiac exam regular rate and rhythm normal S1 and S2       Assessment & Plan:  Impaired glucose tolerance-glycohemoglobin stable at 5.9%  Essential hypertension-stable  Chronic kidney disease-creatinine is stable at 1.43 and previously was 1.54  Chronic foot pain-controlled with hydrocodone/APAP and tramadol  ? Contact dermatitis treat with triamcinolone 0.1% cream 3 times daily  Essential hypertension-stable  Hyperlipidemia-lipid panel stable. Continue diet and exercise efforts. Total cholesterol 179, triglycerides 208, LDL cholesterol 103 on statin.  Insomnia-continue Ambien  Plan: Has to be seen every 90 days for chronic pain prescriptions. Continue same medications. Continue to work on diet exercise. Return in 90 days.

## 2016-02-22 NOTE — Patient Instructions (Signed)
It was a pleasure to see you today. Continue same medications and return in 90 days.

## 2016-02-24 ENCOUNTER — Telehealth: Payer: Self-pay | Admitting: *Deleted

## 2016-02-24 NOTE — Telephone Encounter (Signed)
Rx refill for Zolpidem faxed to [facility]

## 2016-03-09 ENCOUNTER — Other Ambulatory Visit: Payer: Self-pay | Admitting: *Deleted

## 2016-03-09 ENCOUNTER — Telehealth: Payer: Self-pay | Admitting: Internal Medicine

## 2016-03-09 MED ORDER — HYDROCODONE-ACETAMINOPHEN 10-325 MG PO TABS: 1.0000 | ORAL_TABLET | Freq: Three times a day (TID) | ORAL | 0 refills | Status: DC

## 2016-03-09 NOTE — Telephone Encounter (Signed)
Print up [place] 10/325 #90 one po q 8 hours with no refill

## 2016-03-09 NOTE — Telephone Encounter (Signed)
Patient would like his Hydrocodone medication refilled.

## 2016-03-09 NOTE — Telephone Encounter (Signed)
done

## 2016-03-10 ENCOUNTER — Encounter: Payer: Self-pay | Admitting: Internal Medicine

## 2016-03-10 ENCOUNTER — Ambulatory Visit (INDEPENDENT_AMBULATORY_CARE_PROVIDER_SITE_OTHER): Payer: Medicare Other | Admitting: Internal Medicine

## 2016-03-10 VITALS — BP 138/82 | HR 85 | Temp 97.6°F | Wt 199.0 lb

## 2016-03-10 DIAGNOSIS — G4700 Insomnia, unspecified: Secondary | ICD-10-CM | POA: Diagnosis not present

## 2016-03-10 DIAGNOSIS — F411 Generalized anxiety disorder: Secondary | ICD-10-CM | POA: Diagnosis not present

## 2016-03-10 MED ORDER — QUETIAPINE FUMARATE 50 MG PO TABS: 50.0000 mg | ORAL_TABLET | Freq: Every day | ORAL | 0 refills | Status: DC

## 2016-03-10 MED ORDER — SERTRALINE HCL 50 MG PO TABS: 50.0000 mg | ORAL_TABLET | Freq: Every day | ORAL | 3 refills | Status: DC

## 2016-03-10 NOTE — Patient Instructions (Signed)
Add Seroquel 50 mg daily to Ambien at night. Start Zoloft 50 mg daily. Call with progress report in 2 weeks.

## 2016-03-10 NOTE — Progress Notes (Signed)
   Subjective:    Patient ID: Roberto Reid, male    DOB: 05/12/1933, 82 y.o.   MRN: [id number]  HPI  82 year old with long-standing issues of insomnia. Has taken Ambien for number of years. Now wakes up after 4 hours of sleep with Ambien. Has some situational stress with business issues. Has felt anxious at times. Has felt a bit claustrophobic.    Review of Systems see above     Objective:   Physical Exam  Not examined. Spent 20 minutes speaking with him about these issues and how to manage them. I have been reluctant in the past to add more medication to Ambien. He is on chronic narcotic pain medication for foot problem. That seems to be doing a bit better since he is taking the medication every 8 hours regularly.      Assessment & Plan:  Insomnia  Anxiety  Plan: Add Seroquel 50 mg at bedtime. Start Zoloft 50 mg daily for anxiety. To call with progress report in a couple of weeks.

## 2016-03-31 ENCOUNTER — Ambulatory Visit (INDEPENDENT_AMBULATORY_CARE_PROVIDER_SITE_OTHER): Payer: Medicare Other | Admitting: Internal Medicine

## 2016-03-31 ENCOUNTER — Encounter: Payer: Self-pay | Admitting: Internal Medicine

## 2016-03-31 VITALS — BP 120/78 | HR 78 | Wt 199.0 lb

## 2016-03-31 DIAGNOSIS — G8929 Other chronic pain: Secondary | ICD-10-CM | POA: Diagnosis not present

## 2016-03-31 DIAGNOSIS — F411 Generalized anxiety disorder: Secondary | ICD-10-CM | POA: Diagnosis not present

## 2016-03-31 DIAGNOSIS — Z23 Encounter for immunization: Secondary | ICD-10-CM | POA: Diagnosis not present

## 2016-03-31 DIAGNOSIS — G4700 Insomnia, unspecified: Secondary | ICD-10-CM | POA: Diagnosis not present

## 2016-03-31 MED ORDER — HYDROCODONE-ACETAMINOPHEN 10-325 MG PO TABS: 1.0000 | ORAL_TABLET | Freq: Three times a day (TID) | ORAL | 0 refills | Status: DC

## 2016-03-31 NOTE — Patient Instructions (Signed)
Continue Zoloft and Seroquel as directed. Norco refill today beginning November 9 for 30 days. Needs to be seen every 90 days for pain management. Flu vaccine given.

## 2016-03-31 NOTE — Progress Notes (Signed)
   Subjective:    Patient ID: Roberto Reid, male    DOB: [birth year], 82 y.o.   MRN: [id number]  HPI   82 year old Male For follow-up on anxiety and insomnia issues. He is getting good pain control on current regimen. Have refilled Norco for November 9 for 30 days. He will be traveling to [place] soon to look at a new job opportunity.  He started sotalol 50 mg daily. It made him jittery. He can cut back to 25 mg daily for 7-10 days and then increase to 50 mg daily. He's just been taking it intermittently which likely is not helping very much.  Seroquel is helping him sleep 7 hours a night which is excellent.  Flu vaccine given. Pneumonia vaccines are up-to-date  Review of Systems as above     Objective:   Physical Exam  Not examined. Spent 20 minutes speaking with him about these issues      Assessment & Plan:  Anxiety  Insomnia  Chronic pain management  Plan: See above. Take one half of 50 mg Zoloft tablet daily for one week and increase to 1 tablet daily. Follow-up in 90 days for pain management.

## 2016-04-06 ENCOUNTER — Other Ambulatory Visit: Payer: Self-pay | Admitting: *Deleted

## 2016-04-06 MED ORDER — QUETIAPINE FUMARATE 50 MG PO TABS: 50.0000 mg | ORAL_TABLET | Freq: Every day | ORAL | 5 refills | Status: DC

## 2016-04-10 DIAGNOSIS — J069 Acute upper respiratory infection, unspecified: Secondary | ICD-10-CM

## 2016-04-15 ENCOUNTER — Encounter: Payer: Self-pay | Admitting: Internal Medicine

## 2016-04-15 ENCOUNTER — Ambulatory Visit
Admission: RE | Admit: 2016-04-15 | Discharge: 2016-04-15 | Disposition: A | Discharge status: Home / Self Care | Payer: Medicare Other | Source: Ambulatory Visit | Attending: Internal Medicine | Admitting: Internal Medicine

## 2016-04-15 ENCOUNTER — Ambulatory Visit (INDEPENDENT_AMBULATORY_CARE_PROVIDER_SITE_OTHER): Payer: Medicare Other | Admitting: Internal Medicine

## 2016-04-15 VITALS — BP 140/80 | HR 79 | Temp 97.9°F | Ht 71.0 in | Wt 200.0 lb

## 2016-04-15 DIAGNOSIS — J189 Pneumonia, unspecified organism: Secondary | ICD-10-CM

## 2016-04-15 DIAGNOSIS — J181 Lobar pneumonia, unspecified organism: Secondary | ICD-10-CM | POA: Diagnosis not present

## 2016-04-15 DIAGNOSIS — I10 Essential (primary) hypertension: Secondary | ICD-10-CM | POA: Diagnosis not present

## 2016-04-15 DIAGNOSIS — E119 Type 2 diabetes mellitus without complications: Secondary | ICD-10-CM | POA: Diagnosis not present

## 2016-04-15 DIAGNOSIS — R05 Cough: Secondary | ICD-10-CM

## 2016-04-15 DIAGNOSIS — R059 Cough, unspecified: Secondary | ICD-10-CM

## 2016-04-15 DIAGNOSIS — E7800 Pure hypercholesterolemia, unspecified: Secondary | ICD-10-CM

## 2016-04-15 DIAGNOSIS — G4700 Insomnia, unspecified: Secondary | ICD-10-CM

## 2016-04-15 MED ORDER — CEFTRIAXONE SODIUM 1 G IJ SOLR
1.0000 g | Freq: Once | INTRAMUSCULAR | Status: AC
  Administered 2016-04-15: 1 g via INTRAMUSCULAR

## 2016-04-15 MED ORDER — HYDROMET 5-1.5 MG/5ML PO SYRP: ORAL_SOLUTION | ORAL | 0 refills | Status: DC

## 2016-04-15 MED ORDER — ALBUTEROL SULFATE HFA 108 (90 BASE) MCG/ACT IN AERS: 2.0000 | INHALATION_SPRAY | Freq: Four times a day (QID) | RESPIRATORY_TRACT | 2 refills | Status: DC | PRN

## 2016-04-15 MED ORDER — PREDNISONE 10 MG PO TABS: ORAL_TABLET | ORAL | 0 refills | Status: DC

## 2016-04-15 NOTE — Progress Notes (Signed)
   Subjective:    Patient ID: Roberto Reid, male    DOB: May 09, 1933, 82 y.o.   MRN: [id number]  HPI  Nine day history of URI symtoms. Cough, congestion and SOB.Symptoms started after trip to [place] via airplane. Called today complaining of fever and deep congested cough. Has malaise and fatigue. Not sleeping well at night due to cough. Pulse oximetry is 95% on room air.    Review of Systems see above     Objective:   Physical Exam He looks fatigued. Skin warm and dry. Nodes none. Pharynx and TMs are clear. Neck is supple without JVD. He has bilateral rhonchi and wheezing. Chest x-ray shows probable subsegmental atelectasis versus early pneumonia in left lower lobe. Follow-up chest x-ray recommended in 3-4 weeks. By his presentation I do think he has pneumonia       Assessment & Plan:  Left lower lobe pneumonia  Plan: 1 g IM Rocephin. Return tomorrow for another 1 g IM Rocephin and evaluation. Sterapred DS 10 mg 6 day dosepak. Albuterol inhaler 2 sprays by mouth 4 times daily. Hycodan 1 teaspoon by mouth every 8 hours when necessary cough. Rest and drink plenty of fluids.  25 minutes spent with patient. He has history of hypertension, controlled type 2 diabetes mellitus, insomnia, GE reflux.

## 2016-04-15 NOTE — Patient Instructions (Signed)
Have chest x-ray today. 1 g IM Rocephin given. Continue Levaquin. Start prednisone in tapering course starting with 60 mg and tapering to 0 mg over 7 days. Ventolin inhaler 2 sprays by mouth 4 times daily. Refill Hycodan. Return tomorrow for follow-up.

## 2016-04-16 ENCOUNTER — Encounter: Payer: Self-pay | Admitting: Internal Medicine

## 2016-04-16 ENCOUNTER — Ambulatory Visit (INDEPENDENT_AMBULATORY_CARE_PROVIDER_SITE_OTHER): Payer: Medicare Other | Admitting: Internal Medicine

## 2016-04-16 DIAGNOSIS — J189 Pneumonia, unspecified organism: Secondary | ICD-10-CM

## 2016-04-16 DIAGNOSIS — J181 Lobar pneumonia, unspecified organism: Secondary | ICD-10-CM | POA: Diagnosis not present

## 2016-04-16 DIAGNOSIS — J9801 Acute bronchospasm: Secondary | ICD-10-CM

## 2016-04-16 LAB — CBC WITH DIFFERENTIAL/PLATELET
BASOS ABS: 0 {cells}/uL (ref 0–200)
Basophils Relative: 0 %
EOS PCT: 0 %
Eosinophils Absolute: 0 cells/uL — ABNORMAL LOW (ref 15–500)
HCT: 41.9 % (ref 38.5–50.0)
Hemoglobin: 14.6 g/dL (ref 13.2–17.1)
LYMPHS PCT: 6 %
Lymphs Abs: 576 cells/uL — ABNORMAL LOW (ref 850–3900)
MCH: 33.7 pg — AB (ref 27.0–33.0)
MCHC: 34.8 g/dL (ref 32.0–36.0)
MCV: 96.8 fL (ref 80.0–100.0)
MONOS PCT: 3 %
MPV: 10 fL (ref 7.5–12.5)
Monocytes Absolute: 288 cells/uL (ref 200–950)
NEUTROS PCT: 91 %
Neutro Abs: 8736 cells/uL — ABNORMAL HIGH (ref 1500–7800)
PLATELETS: 280 10*3/uL (ref 140–400)
RBC: 4.33 MIL/uL (ref 4.20–5.80)
RDW: 12.9 % (ref 11.0–15.0)
WBC: 9.6 10*3/uL (ref 3.8–10.8)

## 2016-04-16 MED ORDER — CEFTRIAXONE SODIUM 1 G IJ SOLR
1.0000 g | Freq: Once | INTRAMUSCULAR | Status: AC
  Administered 2016-04-16: 1 g via INTRAMUSCULAR

## 2016-04-16 NOTE — Patient Instructions (Addendum)
Rocephin 1 g IM given today. Return tomorrow. Continue same medications as prescribed yesterday. Continue Levaquin.

## 2016-04-16 NOTE — Progress Notes (Signed)
   Subjective:    Patient ID: Roberto Reid, male    DOB: [birth year], 82 y.o.   MRN: [id number]  HPI Here today to follow-up on acute bronchospasm and probable left lower lobe pneumonia. He slept better last night after starting prednisone and inhaler. He was given 1 g IM Rocephin yesterday and again today. Says he has low shortness of breath. Is bringing up some sputum but he doesn't know what color.    Review of Systems see above     Objective:   Physical Exam Continues to have bilateral rhonchi and wheezing. However seems a bit decreased from yesterday. Some clearing of this with coughing.       Assessment & Plan:  Probable left lower lobe pneumonia  Acute bronchospasm  Plan: Rocephin 1 g IM given today. Return tomorrow. CBC with differential drawn today. Continue Levaquin. Continue prednisone taper and Ventolin inhaler.

## 2016-04-17 ENCOUNTER — Encounter: Payer: Self-pay | Admitting: Internal Medicine

## 2016-04-17 ENCOUNTER — Ambulatory Visit (INDEPENDENT_AMBULATORY_CARE_PROVIDER_SITE_OTHER): Payer: Medicare Other | Admitting: Internal Medicine

## 2016-04-17 VITALS — BP 142/80 | HR 88 | Temp 97.4°F | Wt 199.0 lb

## 2016-04-17 DIAGNOSIS — J9801 Acute bronchospasm: Secondary | ICD-10-CM | POA: Diagnosis not present

## 2016-04-17 DIAGNOSIS — J189 Pneumonia, unspecified organism: Secondary | ICD-10-CM

## 2016-04-17 MED ORDER — LEVOFLOXACIN 500 MG PO TABS: 500.0000 mg | ORAL_TABLET | Freq: Every day | ORAL | 0 refills | Status: DC

## 2016-04-17 MED ORDER — PREDNISONE 10 MG PO TABS: ORAL_TABLET | ORAL | 0 refills | Status: DC

## 2016-04-17 MED ORDER — CEFTRIAXONE SODIUM 1 G IJ SOLR
1.0000 g | Freq: Once | INTRAMUSCULAR | Status: AC
  Administered 2016-04-17: 1 g via INTRAMUSCULAR

## 2016-04-17 NOTE — Progress Notes (Signed)
   Subjective:    Patient ID: Roberto Reid, male    DOB: 1933-01-12, 82 y.o.   MRN: [id number]  HPI Follow-up on pneumonia and bronchospasm. Had a rough night last and did not sleep well. A lot of coughing and congestion. We gave him a nebulizer treatment in the office today which seemed to help. I have given him Symbicort 162 sprays by mouth every 12 hours and demonstrated correct use of inhaler today. He was given another 1 g IM Rocephin. We have refilled Levaquin for an additional 10 days. We are going to change his prednisone taper from 6-5-4-3-2-1  To  4-4-3-3-2-2-1-1. He will return next week on Tuesday, November 21 for follow-up.    Review of Systems as above. Lethargy and decreased energy. No fever     Objective:   Physical Exam Skin warm and dry. Nodes none. Chest bilateral rhonchi and wheezing which cleared some after nebulizer treatment. Cardiac exam regular rate and rhythm. Pulse oximetry 96%       Assessment & Plan:  Acute bronchospasm  Left lower lobe pneumonia  Plan: Rocephin 1 g IM. Nebulizer treatment given in office with improvement in bronchospasm. Add Symbicort 162 sprays by mouth every 12 hours. Continue Ventolin inhaler 2 sprays by mouth 4 times daily. Demonstration in correct use of inhalers. Patient demonstrated correct use after teaching. Levaquin 500 milligrams daily for an additional 10 days. Take Hycodan sparingly. Change prednisone taper to 4-4-3-3-2-2-1-1. Follow-up November 21. Rest and drink plenty of fluids.

## 2016-04-17 NOTE — Patient Instructions (Signed)
Nebulizer treatment given in office. 1 g IM Rocephin given in office. Patient declines nebulizer to use at home. Add Symbicort 162 sprays by mouth every 12 hours to Ventolin inhaler 2 sprays by mouth 4 times a day. Continue Levaquin 500 milligrams daily for an additional 10 days. Change prednisone taper to 4-4-3-3-2-2-1-1. Take Hycodan sparingly. Return on November 21. Rest and drink plenty of fluids.

## 2016-04-21 ENCOUNTER — Encounter: Payer: Self-pay | Admitting: Internal Medicine

## 2016-04-21 ENCOUNTER — Ambulatory Visit (INDEPENDENT_AMBULATORY_CARE_PROVIDER_SITE_OTHER): Payer: Medicare Other | Admitting: Internal Medicine

## 2016-04-21 VITALS — BP 132/80 | HR 98 | Temp 97.9°F | Ht 71.0 in | Wt 194.0 lb

## 2016-04-21 DIAGNOSIS — J181 Lobar pneumonia, unspecified organism: Secondary | ICD-10-CM | POA: Diagnosis not present

## 2016-04-21 DIAGNOSIS — J189 Pneumonia, unspecified organism: Secondary | ICD-10-CM

## 2016-04-21 DIAGNOSIS — J9801 Acute bronchospasm: Secondary | ICD-10-CM

## 2016-04-21 NOTE — Progress Notes (Signed)
   Subjective:    Patient ID: Roberto Reid, male    DOB: [birth year], 82 y.o.   MRN: [id number]  HPI  82 year old Male currently under treatment for left lower lobe pneumonia and bronchospasm. He's feeling better. Less shortness of breath. Less cough and wheezing.    Review of Systems as above     Objective:   Physical Exam Neck supple without JVD thyromegaly or bruits. Chest completely clear to auscultation without rales, rhonchi or wheezing. No tachypnea.      Assessment & Plan:  Improvement left lower lobe pneumonia Aref plan: Finish course of antibiotics and steroids as previously prescribed. Continue inhalers for an additional 2 weeks. Is to have repeat chest x-ray in December around December 22. Rest and drink plenty of fluids.

## 2016-04-21 NOTE — Patient Instructions (Signed)
Continue to rest and drink plenty of fluids and finish course of antibiotics and steroids as previously prescribed. We'll have repeat chest x-ray December 22 for follow-up of left lower lobe pneumonia.

## 2016-05-04 ENCOUNTER — Other Ambulatory Visit: Payer: Self-pay | Admitting: Internal Medicine

## 2016-05-04 MED ORDER — AMLODIPINE BESYLATE 5 MG PO TABS: 5.0000 mg | ORAL_TABLET | Freq: Every day | ORAL | 1 refills | Status: DC

## 2016-05-06 ENCOUNTER — Telehealth: Payer: Self-pay | Admitting: Internal Medicine

## 2016-05-06 NOTE — Telephone Encounter (Signed)
Patient's wife is calling for a refill on his Hydrocodone 10-325mg .  Patient is out of town until Thursday evening.  He would like to pick this up on Friday sometime please.    Thank you.

## 2016-05-07 MED ORDER — HYDROCODONE-ACETAMINOPHEN 10-325 MG PO TABS: 1.0000 | ORAL_TABLET | Freq: Three times a day (TID) | ORAL | 0 refills | Status: DC

## 2016-05-07 NOTE — Telephone Encounter (Signed)
Hydrocodone rx printed.  Patient wife aware can pick up 05/08/2016.

## 2016-05-07 NOTE — Telephone Encounter (Signed)
Last hydrocodone rx was printed 04/09/2016.

## 2016-05-07 NOTE — Telephone Encounter (Signed)
I just refilled this at his last visit I believe. Will you check. It was to save him a visit???

## 2016-05-07 NOTE — Telephone Encounter (Signed)
Refill once and date for tomorrow.

## 2016-05-14 ENCOUNTER — Ambulatory Visit (INDEPENDENT_AMBULATORY_CARE_PROVIDER_SITE_OTHER): Payer: Medicare Other | Admitting: Internal Medicine

## 2016-05-14 ENCOUNTER — Encounter: Payer: Self-pay | Admitting: Internal Medicine

## 2016-05-14 ENCOUNTER — Telehealth: Payer: Self-pay | Admitting: Internal Medicine

## 2016-05-14 VITALS — BP 148/88 | HR 93 | Temp 97.4°F | Wt 198.0 lb

## 2016-05-14 DIAGNOSIS — L819 Disorder of pigmentation, unspecified: Secondary | ICD-10-CM

## 2016-05-14 DIAGNOSIS — J209 Acute bronchitis, unspecified: Secondary | ICD-10-CM

## 2016-05-14 MED ORDER — CEFTRIAXONE SODIUM 1 G IJ SOLR
1.0000 g | Freq: Once | INTRAMUSCULAR | Status: AC
  Administered 2016-05-14: 1 g via INTRAMUSCULAR

## 2016-05-14 MED ORDER — LEVOFLOXACIN 500 MG PO TABS: 500.0000 mg | ORAL_TABLET | Freq: Every day | ORAL | 0 refills | Status: DC

## 2016-05-14 MED ORDER — HYDROCODONE-HOMATROPINE 5-1.5 MG/5ML PO SYRP: ORAL_SOLUTION | ORAL | 0 refills | Status: DC

## 2016-05-14 MED ORDER — PREDNISONE 10 MG PO TABS: ORAL_TABLET | ORAL | 0 refills | Status: DC

## 2016-05-14 NOTE — Addendum Note (Signed)
Addended by: Elby Showers on: 05/14/2016 01:06 PM   Modules accepted: Orders

## 2016-05-14 NOTE — Telephone Encounter (Signed)
Left message for patient to go have chest xray done at [facility] per Dr Renold Genta.

## 2016-05-14 NOTE — Patient Instructions (Signed)
Levaquin 500 milligrams daily for 10 days. 1 g IM Rocephin. Sterapred DS 10 mg 12 day dosepak take as directed. Hycodan 1 teaspoon by mouth every 8 hours when necessary cough.

## 2016-05-14 NOTE — Progress Notes (Signed)
   Subjective:    Patient ID: Roberto Reid, male    DOB: [birth year], 82 y.o.   MRN: [id number]  HPI 82 year old white male recently treated for left lower lobe pneumonia late November with associated bronchospasm. He was treated with Levaquin and prednisone and inhalers as well as Hycodan for cough suppression. He is scheduled to have repeat chest x-ray around December 22 for follow-up of that process.  However in the interim he has been to an [place] for a trip for work. While there he came down with a respiratory infection. He's had a lot of coughing and some wheezing with shortness of breath. No fever or shaking chills.  Also recently noticed over the past few days that his left fourth finger is discolored from DIP joint to the chip. It sensitive to touch. He has no known injury. It feels cold to touch.    Review of Systems see above     Objective:   Physical Exam Left fourth finger has violaceous discoloration palmar aspect. Good range of motion with the left fourth finger. Cool to touch. Sensitive to light touch.  He has bilateral rhonchi with occasional scattered wheezing.       Assessment & Plan:  Acute bronchitis  Discolored left fourth finger-? Vascular issue?Seems like Raynaud's should involve more than one digit.  Plan: 1 g IM Rocephin. Levaquin 500 milligrams daily for 10 days. Sterapred DS 10 mg dosepak to take in tapering course over 12 days. Continue inhalers. Refill Hycodan.  Is to have repeat chest x-ray around December 22. Rest and drink plenty of fluids. Appointment with hand surgeon tomorrow.

## 2016-05-15 ENCOUNTER — Ambulatory Visit
Admission: RE | Admit: 2016-05-15 | Discharge: 2016-05-15 | Disposition: A | Discharge status: Home / Self Care | Payer: Medicare Other | Source: Ambulatory Visit | Attending: Internal Medicine | Admitting: Internal Medicine

## 2016-05-15 ENCOUNTER — Telehealth: Payer: Self-pay | Admitting: Internal Medicine

## 2016-05-15 DIAGNOSIS — J189 Pneumonia, unspecified organism: Secondary | ICD-10-CM | POA: Diagnosis not present

## 2016-05-15 DIAGNOSIS — J209 Acute bronchitis, unspecified: Secondary | ICD-10-CM

## 2016-05-15 DIAGNOSIS — M79642 Pain in left hand: Secondary | ICD-10-CM | POA: Diagnosis not present

## 2016-05-15 DIAGNOSIS — L819 Disorder of pigmentation, unspecified: Secondary | ICD-10-CM | POA: Diagnosis not present

## 2016-05-15 NOTE — Telephone Encounter (Signed)
-----   Message from Elby Showers, MD sent at 05/15/2016 12:41 PM EST ----- Please call pt. CXR is normal

## 2016-05-15 NOTE — Telephone Encounter (Signed)
Patient aware of chest xray results.

## 2016-05-21 ENCOUNTER — Other Ambulatory Visit (HOSPITAL_COMMUNITY): Payer: Self-pay | Admitting: Orthopedic Surgery

## 2016-05-21 ENCOUNTER — Ambulatory Visit (HOSPITAL_COMMUNITY)
Admission: RE | Admit: 2016-05-21 | Discharge: 2016-05-21 | Disposition: A | Discharge status: Home / Self Care | Payer: Medicare Other | Source: Ambulatory Visit | Attending: Vascular Surgery | Admitting: Vascular Surgery

## 2016-05-21 DIAGNOSIS — L819 Disorder of pigmentation, unspecified: Secondary | ICD-10-CM | POA: Diagnosis not present

## 2016-06-02 ENCOUNTER — Encounter: Payer: Self-pay | Admitting: Internal Medicine

## 2016-06-02 ENCOUNTER — Ambulatory Visit
Admission: RE | Admit: 2016-06-02 | Discharge: 2016-06-02 | Disposition: A | Discharge status: Home / Self Care | Payer: Medicare Other | Source: Ambulatory Visit | Attending: Internal Medicine | Admitting: Internal Medicine

## 2016-06-02 ENCOUNTER — Telehealth: Payer: Self-pay | Admitting: Internal Medicine

## 2016-06-02 ENCOUNTER — Ambulatory Visit (INDEPENDENT_AMBULATORY_CARE_PROVIDER_SITE_OTHER): Payer: Medicare Other | Admitting: Internal Medicine

## 2016-06-02 VITALS — BP 138/68 | HR 103 | Temp 99.9°F | Wt 196.0 lb

## 2016-06-02 DIAGNOSIS — J9801 Acute bronchospasm: Secondary | ICD-10-CM | POA: Diagnosis not present

## 2016-06-02 DIAGNOSIS — R059 Cough, unspecified: Secondary | ICD-10-CM

## 2016-06-02 DIAGNOSIS — R05 Cough: Secondary | ICD-10-CM

## 2016-06-02 DIAGNOSIS — R062 Wheezing: Secondary | ICD-10-CM

## 2016-06-02 DIAGNOSIS — J209 Acute bronchitis, unspecified: Secondary | ICD-10-CM

## 2016-06-02 MED ORDER — DOXYCYCLINE HYCLATE 100 MG PO TABS: 100.0000 mg | ORAL_TABLET | Freq: Two times a day (BID) | ORAL | 0 refills | Status: DC

## 2016-06-02 MED ORDER — CEFTRIAXONE SODIUM 1 G IJ SOLR
1.0000 g | Freq: Once | INTRAMUSCULAR | Status: AC
  Administered 2016-06-02: 1 g via INTRAMUSCULAR

## 2016-06-02 MED ORDER — HYDROCOD POLST-CPM POLST ER 10-8 MG/5ML PO SUER: 5.0000 mL | Freq: Two times a day (BID) | ORAL | 0 refills | Status: DC | PRN

## 2016-06-02 MED ORDER — ONDANSETRON HCL 4 MG PO TABS: 4.0000 mg | ORAL_TABLET | Freq: Three times a day (TID) | ORAL | 0 refills | Status: DC | PRN

## 2016-06-02 MED ORDER — PREDNISONE 10 MG PO TABS: ORAL_TABLET | ORAL | 0 refills | Status: DC

## 2016-06-02 NOTE — Telephone Encounter (Signed)
Spoke with patient and appointment has been made

## 2016-06-02 NOTE — Telephone Encounter (Signed)
He needs OV if not better

## 2016-06-02 NOTE — Telephone Encounter (Signed)
-----   Message from Elby Showers, MD sent at 06/02/2016  2:09 PM EST ----- Please call pt. No pneumonia. See tomorrow. Please call Pulmonary and see if they can see him this week. Coughing and wheezing. Bronchospasm.

## 2016-06-02 NOTE — Progress Notes (Signed)
   Subjective:    Patient ID: Roberto Reid, male    DOB: 07/02/1932, 83 y.o.   MRN: [id number]  HPI  Had LLL pneumonia In November. Pneumonia was diagnosed in November 15. He was treated with Levaquin, prednisone, inhalers and IM Rocephin for 3 days. He subsequently improved. He was seen again on December 14 with another bout of acute bronchitis. Chest x-ray on December 15 was negative for pneumonia. Once again was placed on Levaquin and steroids. Did not see him again until today.  Complaining of weakness decreased appetite shortness of breath and wheezing. Not sleeping well due to wheezing and cough  Had 3 Levaquin left so took these over the weekend.  Review of Systems No appetite. 99-100 fever at night. Weak, SOB, wheezing   Had vascular study by Dr. Fredna Dow with possible left fourth digit embolus. Will last Dr. Fredna Dow to follow-up on this. Patient is supposed to be on aspirin daily but has not been taking it.  Multiple bouts of pneumonia over the years beginning at age 60 as a child    Objective:   Physical Exam Chest exam reveals bilateral rhonchi and wheezing. Chest x-ray negative for pneumonia. Pulses regular.       Assessment & Plan:  Acute bronchitis  Acute bronchospasm  Probable left fourth digit embolus  Plan: 1 g IM Rocephin. Sterapred DS 10 mg 12 day dosepak. Doxycycline 100 mg twice daily for 10 days. Pulmonary consultation regarding bronchospasm that is recurrent. Continue with inhalers. Tussionex 1 teaspoon by mouth every 12 hours when necessary cough. Zofran as needed for nausea. Return tomorrow for reevaluation and probably another 1 g IM Rocephin.  He has to travel to [place] next week so he's anxious to get better. Follow-up with Dr. Fredna Dow regarding left fourth finger.

## 2016-06-02 NOTE — Telephone Encounter (Signed)
appt at [facility] 06/09/16 at 915.  Patient aware chest xray was negative for penumonia and pulmonary appointment.

## 2016-06-02 NOTE — Telephone Encounter (Signed)
Patient states that she still has his chest cold from a month ago and was wondering what should be done about it? Patient uses Midwife. Please advise.

## 2016-06-03 ENCOUNTER — Telehealth: Payer: Self-pay | Admitting: Internal Medicine

## 2016-06-03 ENCOUNTER — Encounter: Payer: Self-pay | Admitting: Internal Medicine

## 2016-06-03 ENCOUNTER — Ambulatory Visit (INDEPENDENT_AMBULATORY_CARE_PROVIDER_SITE_OTHER): Payer: Medicare Other | Admitting: Internal Medicine

## 2016-06-03 VITALS — BP 158/98 | HR 128 | Temp 97.8°F | Wt 190.0 lb

## 2016-06-03 DIAGNOSIS — I10 Essential (primary) hypertension: Secondary | ICD-10-CM

## 2016-06-03 DIAGNOSIS — R05 Cough: Secondary | ICD-10-CM | POA: Diagnosis not present

## 2016-06-03 DIAGNOSIS — E781 Pure hyperglyceridemia: Secondary | ICD-10-CM

## 2016-06-03 DIAGNOSIS — Z8701 Personal history of pneumonia (recurrent): Secondary | ICD-10-CM

## 2016-06-03 DIAGNOSIS — E119 Type 2 diabetes mellitus without complications: Secondary | ICD-10-CM

## 2016-06-03 DIAGNOSIS — R059 Cough, unspecified: Secondary | ICD-10-CM

## 2016-06-03 DIAGNOSIS — E8881 Metabolic syndrome: Secondary | ICD-10-CM | POA: Diagnosis not present

## 2016-06-03 DIAGNOSIS — J9801 Acute bronchospasm: Secondary | ICD-10-CM | POA: Diagnosis not present

## 2016-06-03 DIAGNOSIS — I742 Embolism and thrombosis of arteries of the upper extremities: Secondary | ICD-10-CM | POA: Diagnosis not present

## 2016-06-03 MED ORDER — CEFTRIAXONE SODIUM 1 G IJ SOLR
1.0000 g | Freq: Once | INTRAMUSCULAR | Status: AC
  Administered 2016-06-03: 1 g via INTRAMUSCULAR

## 2016-06-03 NOTE — Progress Notes (Signed)
   Subjective:    Patient ID: Roberto Reid, male    DOB: Jul 01, 1932, 83 y.o.   MRN: [id number]  HPI  83 year old for follow-up from yesterday with acute bronchospasm. Chest x-ray showed no pneumonia. Prednisone seemed to help and is feeling better.  He developed pneumonia of the left lower lobe with travel. He was seen here November 15 treated with Rocephin IM, Sterapred DS 10 mg six-day Dosepak, albuterol inhaler, Hycodan and Levaquin. We saw him on November 15 16 and 17 each time giving him 1 g IM Rocephin. He returned on November 21 and felt better.  He had repeat chest x-ray on December 15 showing resolution of pneumonia. Chest x-ray yesterday showed no pneumonia.  However by December 14 he had developed acute bronchitis and I discolored left fourth finger. He was once again given 1 g IM Rocephin started on Levaquin and a 12 day Sterapred DS 10 mg dosepak. He also had albuterol and Symbicort inhalers. Hycodan was refilled.  I did not seem again until yesterday, January 2. He appeared saying he was weak short of breath and wheezing. Chest x-ray was negative for pneumonia. Once again was given 1 g IM Rocephin. This time was treated with doxycycline instead of Levaquin. Was given Zofran for nausea. Continued with inhalers and placed back on prednisone. Appointment made for him to see pulmonologist with recurrent wheezing and coughing. He will see Dr. Alva Garnet tomorrow.  Patient says he had pneumonia at age 76 and was out of school for year. Has had recurrent pneumonia several times since then.  He has to travel to [place] next week for  a consulting opportunity. 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 He'll be traveling there weekly for several weeks.  He also has what appears to be an infarction tip of left fourth finger. Dr. Fredna Dow called him yesterday and he has an appointment to be seen later today. Apparently vascular study showed possible  distal embolus in left fourth finger. He has no history of atrial fibrillation.    Review of Systems slept well and had a better night with less coughing and wheezing. Feeling better.     Objective:   Physical Exam Chest exam much clear with less rhonchi and wheezing.       Assessment & Plan:  Recurrent bronchospasm and bronchitis  History of multiple episodes of pneumonia ? Bronchiectasis  ? Infarction left forefinger-see Dr. Fredna Dow today may need further evaluation such as 2-D echocardiogram. Should take aspirin 325 mg daily. He's not been doing that even though Dr. Fredna Dow advised him to do that on December 15. No history of atrial fibrillation that I'm aware of. May need Holter monitor. Episode occurred simultaneously with episode of bronchospasm  Plan: To see pulmonologist tomorrow. Since he is going to be doing extensive traveling we need to make sure we have bronchospasm under control.

## 2016-06-03 NOTE — Patient Instructions (Signed)
Doxycycline 100 mg twice daily for 10 days. Have chest x-ray. Sterapred DS 10 mg 12 day dosepak. Continue inhalers. Tussionex 1 teaspoon by mouth every 12 hours when necessary cough. Pulmonary consultation. Follow-up with Dr. Fredna Dow.

## 2016-06-03 NOTE — Patient Instructions (Signed)
See Dr. Fredna Dow today regarding left forefinger probable infarction. Continue medications as prescribed. Given another 1 g IM Rocephin today. See Dr. Alva Garnet tomorrow.

## 2016-06-03 NOTE — Telephone Encounter (Signed)
Patient saw Dr. Fredna Dow this afternoon. Patient says Dr. Fredna Dow wants to schedule arteriogram in the near future. Patient says he'll set this up as soon as possible depending on his work schedule.

## 2016-06-04 ENCOUNTER — Encounter: Payer: Self-pay | Admitting: Pulmonary Disease

## 2016-06-04 ENCOUNTER — Ambulatory Visit (INDEPENDENT_AMBULATORY_CARE_PROVIDER_SITE_OTHER): Payer: Medicare Other | Admitting: Pulmonary Disease

## 2016-06-04 VITALS — BP 148/88 | HR 117 | Wt 193.0 lb

## 2016-06-04 DIAGNOSIS — Z87891 Personal history of nicotine dependence: Secondary | ICD-10-CM

## 2016-06-04 DIAGNOSIS — J189 Pneumonia, unspecified organism: Secondary | ICD-10-CM

## 2016-06-04 DIAGNOSIS — I742 Embolism and thrombosis of arteries of the upper extremities: Secondary | ICD-10-CM | POA: Insufficient documentation

## 2016-06-04 DIAGNOSIS — J45909 Unspecified asthma, uncomplicated: Secondary | ICD-10-CM

## 2016-06-04 NOTE — Patient Instructions (Signed)
Follow up as needed

## 2016-06-05 ENCOUNTER — Telehealth: Payer: Self-pay | Admitting: Internal Medicine

## 2016-06-05 NOTE — Progress Notes (Signed)
PULMONARY CONSULT NOTE  Requesting MD/Service: Roberto Bristle, MD Date of initial consultation: 06/04/16 Reason for consultation: Recurrent PNA  PT PROFILE: 83 y.o. M former smoker with recurrent pneumonias (he believes 9 in his lifetime, first @ age 25 and most recently 04/20/16).   HPI:  32 M with recurrent PNAs as noted above who suffered PNA 04/20/16 with symptoms that lingered for several weeks. He is consequently referred for further evaluation. However, he states that his symptoms resolved "dramatically" 2 days prior to this evaluation and feels that he is back to his prior baseline. In between these bouts of PNA, his respiratory status is normal and he thinks that his exercise tolerance exceeds that of an average 81 yo M. He is a former smoker of 3/4 PPD and quit 1993.   DATA:  CXR 04/15/16: COPD. Probable subsegmental atelectasis versus early pneumonia in the left lower lobe. No definite pneumonia CXR 05/15/16: NACPD CXR 06/02/16: NACPD  Past Medical History:  Diagnosis Date  . Arthritis   . Asthma    AS CHILD ONLY   . GERD (gastroesophageal reflux disease)   . Hearing loss   . Hyperlipidemia   . Kidney stone   . Pneumonia    hx of PNA    Past Surgical History:  Procedure Laterality Date  . ELBOW SURGERY     RIGHT  . EYE SURGERY     cataract, bilateral 11/10  . FOOT SURGERY Right 2010   right foot "attempted reconstruction" per pt  . KNEE ARTHROPLASTY  12/14/2011   LEFT KNEE  . KNEE ARTHROSCOPY Right 10/08/2014   Procedure: ARTHROSCOPY KNEE;  Surgeon: Roberto Huger, MD;  Location: [facility];  Service: Orthopedics;  Laterality: Right;  . QUADRICEPS TENDON REPAIR Right 10/08/2014   Procedure: REPAIR QUADRICEP TENDON;  Surgeon: Roberto Huger, MD;  Location: [facility];  Service: Orthopedics;  Laterality: Right;  . repair torn knee cartilage    . TONSILLECTOMY    . TOTAL KNEE ARTHROPLASTY  12/14/2011   Procedure: TOTAL KNEE ARTHROPLASTY;  Surgeon: Roberto Haskell, MD;  Location: [facility];   Service: Orthopedics;  Laterality: Left;    MEDICATIONS: I have reviewed all medications and confirmed regimen as documented  Social History   Social History  . Marital status: Married    Spouse name: N/A  . Number of children: N/A  . Years of education: N/A   Occupational History  . Not on file.   Social History Main Topics  . Smoking status: Former Smoker    Types: Cigarettes    Quit date: 12/14/1991  . Smokeless tobacco: Never Used  . Alcohol use Yes     Comment: social  . Drug use: No  . Sexual activity: Not Currently   Other Topics Concern  . Not on file   Social History Narrative  . No narrative on file    Family History  Problem Relation Age of Onset  . Parkinson's disease Father     ROS: No fever, myalgias/arthralgias, unexplained weight loss or weight gain No new focal weakness or sensory deficits No otalgia, hearing loss, visual changes, nasal and sinus symptoms, mouth and throat problems No neck pain or adenopathy No abdominal pain, N/V/D, diarrhea, change in bowel pattern No dysuria, change in urinary pattern   Vitals:   06/04/16 1206  BP: (!) 148/88  Pulse: (!) 117  SpO2: 95%  Weight: 193 lb (87.5 kg)     EXAM:  Gen: WDWN, No overt respiratory distress HEENT: NCAT, sclera white,  oropharynx normal Neck: Supple without LAN, thyromegaly, JVD Lungs: breath sounds full, percussion normal, minimal bibasilar crackles which partially resolve with cough Cardiovascular: tachy, regular, no murmurs noted Abdomen: Soft, nontender, normal BS Ext: without clubbing, cyanosis, edema Neuro: CNs grossly intact, motor and sensory intact Skin: Limited exam, no lesions noted  DATA:   BMP Latest Ref Rng & Units 02/18/2016 12/13/2015 08/19/2015  Glucose 65 - 99 mg/dL 129(H) 110(H) 136(H)  BUN 7 - 25 mg/dL 22 29(H) 29(H)  Creatinine 0.70 - 1.11 mg/dL 1.43(H) 1.51(H) 1.47(H)  Sodium 135 - 146 mmol/L 139 142 141  Potassium 3.5 - 5.3 mmol/L 4.9 4.7 4.5   Chloride 98 - 110 mmol/L 104 104 104  CO2 20 - 31 mmol/L 28 27 27   Calcium 8.6 - 10.3 mg/dL 8.9 8.5(L) 9.0    CBC Latest Ref Rng & Units 04/16/2016 08/19/2015 10/04/2014  WBC 3.8 - 10.8 K/uL 9.6 4.7 9.3  Hemoglobin 13.2 - 17.1 g/dL 14.6 13.5 14.1  Hematocrit 38.5 - 50.0 % 41.9 39.9 41.5  Platelets 140 - 400 K/uL 280 227 270    CXR:  As above SPIROMETRY 06/04/16: normal  IMPRESSION:     ICD-9-CM ICD-10-CM   1. Recurrent pneumonia 486 J18.9   2. Former smoker V15.82 Z87.891 Spirometry with Graph     Spirometry with Graph  3. Asthmatic bronchitis, suspected 493.90 J45.909   1) Recurrent pneumonias - unclear etiology. These have occurred over his whole life (first @ age 74) and have not required hospitalization as an adult. He does not have any clear risk factors - does not seem to have an immunodeficiency or dysphagia to suggest aspiration 2) Former smoker without evidence of COPD  He might be having recurrent bouts of acute bronchitis.   The only significant finding on today's exam is tachycardia at rest. This is unusual considering his apparent level of fitness and lack of cardiac diagnoses   PLAN:  1) No further pulmonary evaluation @ this time 2) Consider echocardiogram to evaluate resting tachycardia 3) Follow up as needed. He is to contact our office for repeat evaluation if he has a recurrence of respiratory/pulmonary symptoms.   Roberto Border, MD PCCM service Mobile [phone] Pager [phone] 06/05/2016

## 2016-06-05 NOTE — Telephone Encounter (Signed)
Patient called requesting that Dr. Renold Genta speak with him about his recent appointment with the pulmonary MD. Please advise

## 2016-06-05 NOTE — Telephone Encounter (Signed)
Called patient and he says Dr. Alva Garnet did not change any meds. Says tip of finger has improved.

## 2016-06-09 ENCOUNTER — Other Ambulatory Visit (HOSPITAL_COMMUNITY): Payer: Self-pay | Admitting: Orthopedic Surgery

## 2016-06-09 ENCOUNTER — Institutional Professional Consult (permissible substitution): Payer: Medicare Other | Admitting: Pulmonary Disease

## 2016-06-09 ENCOUNTER — Telehealth (HOSPITAL_COMMUNITY): Payer: Self-pay

## 2016-06-09 DIAGNOSIS — I742 Embolism and thrombosis of arteries of the upper extremities: Secondary | ICD-10-CM

## 2016-06-09 NOTE — Telephone Encounter (Signed)
Called to schedule angio, left message for pt to call back. AW

## 2016-06-15 ENCOUNTER — Emergency Department (HOSPITAL_COMMUNITY): Payer: Medicare Other

## 2016-06-15 ENCOUNTER — Encounter: Payer: Self-pay | Admitting: Internal Medicine

## 2016-06-15 ENCOUNTER — Ambulatory Visit (INDEPENDENT_AMBULATORY_CARE_PROVIDER_SITE_OTHER): Payer: Medicare Other | Admitting: Internal Medicine

## 2016-06-15 ENCOUNTER — Encounter (HOSPITAL_COMMUNITY): Payer: Self-pay | Admitting: Emergency Medicine

## 2016-06-15 ENCOUNTER — Observation Stay (HOSPITAL_COMMUNITY)
Admission: EM | Admit: 2016-06-15 | Discharge: 2016-06-17 | Disposition: A | Discharge status: Home / Self Care | Payer: Medicare Other | Attending: Internal Medicine | Admitting: Internal Medicine

## 2016-06-15 ENCOUNTER — Emergency Department (HOSPITAL_BASED_OUTPATIENT_CLINIC_OR_DEPARTMENT_OTHER)
Admit: 2016-06-15 | Discharge: 2016-06-15 | Disposition: A | Discharge status: Home / Self Care | Payer: Medicare Other | Attending: Emergency Medicine | Admitting: Emergency Medicine

## 2016-06-15 VITALS — BP 110/70 | HR 66 | Temp 97.8°F | Wt 184.0 lb

## 2016-06-15 DIAGNOSIS — M1990 Unspecified osteoarthritis, unspecified site: Secondary | ICD-10-CM | POA: Diagnosis not present

## 2016-06-15 DIAGNOSIS — Z96652 Presence of left artificial knee joint: Secondary | ICD-10-CM | POA: Insufficient documentation

## 2016-06-15 DIAGNOSIS — E872 Acidosis: Secondary | ICD-10-CM | POA: Insufficient documentation

## 2016-06-15 DIAGNOSIS — R000 Tachycardia, unspecified: Secondary | ICD-10-CM

## 2016-06-15 DIAGNOSIS — Z885 Allergy status to narcotic agent status: Secondary | ICD-10-CM | POA: Insufficient documentation

## 2016-06-15 DIAGNOSIS — I2699 Other pulmonary embolism without acute cor pulmonale: Secondary | ICD-10-CM | POA: Diagnosis not present

## 2016-06-15 DIAGNOSIS — R0600 Dyspnea, unspecified: Secondary | ICD-10-CM

## 2016-06-15 DIAGNOSIS — R0602 Shortness of breath: Secondary | ICD-10-CM

## 2016-06-15 DIAGNOSIS — K219 Gastro-esophageal reflux disease without esophagitis: Secondary | ICD-10-CM | POA: Insufficient documentation

## 2016-06-15 DIAGNOSIS — K8020 Calculus of gallbladder without cholecystitis without obstruction: Secondary | ICD-10-CM | POA: Diagnosis not present

## 2016-06-15 DIAGNOSIS — Z87891 Personal history of nicotine dependence: Secondary | ICD-10-CM | POA: Diagnosis not present

## 2016-06-15 DIAGNOSIS — Z87442 Personal history of urinary calculi: Secondary | ICD-10-CM | POA: Insufficient documentation

## 2016-06-15 DIAGNOSIS — I82411 Acute embolism and thrombosis of right femoral vein: Secondary | ICD-10-CM | POA: Diagnosis not present

## 2016-06-15 DIAGNOSIS — Z8701 Personal history of pneumonia (recurrent): Secondary | ICD-10-CM | POA: Insufficient documentation

## 2016-06-15 DIAGNOSIS — I129 Hypertensive chronic kidney disease with stage 1 through stage 4 chronic kidney disease, or unspecified chronic kidney disease: Secondary | ICD-10-CM | POA: Diagnosis not present

## 2016-06-15 DIAGNOSIS — E1165 Type 2 diabetes mellitus with hyperglycemia: Secondary | ICD-10-CM | POA: Diagnosis not present

## 2016-06-15 DIAGNOSIS — E1122 Type 2 diabetes mellitus with diabetic chronic kidney disease: Secondary | ICD-10-CM | POA: Insufficient documentation

## 2016-06-15 DIAGNOSIS — E785 Hyperlipidemia, unspecified: Secondary | ICD-10-CM | POA: Insufficient documentation

## 2016-06-15 DIAGNOSIS — Z7982 Long term (current) use of aspirin: Secondary | ICD-10-CM | POA: Insufficient documentation

## 2016-06-15 DIAGNOSIS — D696 Thrombocytopenia, unspecified: Secondary | ICD-10-CM | POA: Diagnosis not present

## 2016-06-15 DIAGNOSIS — N183 Chronic kidney disease, stage 3 (moderate): Secondary | ICD-10-CM | POA: Diagnosis not present

## 2016-06-15 DIAGNOSIS — M7989 Other specified soft tissue disorders: Secondary | ICD-10-CM | POA: Diagnosis not present

## 2016-06-15 DIAGNOSIS — Z882 Allergy status to sulfonamides status: Secondary | ICD-10-CM | POA: Diagnosis not present

## 2016-06-15 DIAGNOSIS — D72829 Elevated white blood cell count, unspecified: Secondary | ICD-10-CM

## 2016-06-15 DIAGNOSIS — E871 Hypo-osmolality and hyponatremia: Secondary | ICD-10-CM | POA: Insufficient documentation

## 2016-06-15 DIAGNOSIS — Z7984 Long term (current) use of oral hypoglycemic drugs: Secondary | ICD-10-CM | POA: Insufficient documentation

## 2016-06-15 DIAGNOSIS — R739 Hyperglycemia, unspecified: Secondary | ICD-10-CM | POA: Diagnosis not present

## 2016-06-15 DIAGNOSIS — I824Z1 Acute embolism and thrombosis of unspecified deep veins of right distal lower extremity: Secondary | ICD-10-CM | POA: Diagnosis not present

## 2016-06-15 DIAGNOSIS — N179 Acute kidney failure, unspecified: Secondary | ICD-10-CM | POA: Diagnosis not present

## 2016-06-15 HISTORY — DX: Personal history of urinary calculi: Z87.442

## 2016-06-15 HISTORY — DX: Unspecified asthma, uncomplicated: J45.909

## 2016-06-15 LAB — BASIC METABOLIC PANEL
Anion gap: 14 (ref 5–15)
BUN: 44 mg/dL — ABNORMAL HIGH (ref 6–20)
CHLORIDE: 94 mmol/L — AB (ref 101–111)
CO2: 23 mmol/L (ref 22–32)
Calcium: 8.8 mg/dL — ABNORMAL LOW (ref 8.9–10.3)
Creatinine, Ser: 1.93 mg/dL — ABNORMAL HIGH (ref 0.61–1.24)
GFR calc Af Amer: 36 mL/min — ABNORMAL LOW (ref >60)
GFR calc non Af Amer: 31 mL/min — ABNORMAL LOW (ref >60)
GLUCOSE: 339 mg/dL — AB (ref 65–99)
POTASSIUM: 4.6 mmol/L (ref 3.5–5.1)
Sodium: 131 mmol/L — ABNORMAL LOW (ref 135–145)

## 2016-06-15 LAB — CBC
HCT: 41.5 % (ref 39.0–52.0)
Hemoglobin: 14.4 g/dL (ref 13.0–17.0)
MCH: 32.4 pg (ref 26.0–34.0)
MCHC: 34.7 g/dL (ref 30.0–36.0)
MCV: 93.3 fL (ref 78.0–100.0)
Platelets: 181 10*3/uL (ref 150–400)
RBC: 4.45 MIL/uL (ref 4.22–5.81)
RDW: 14.1 % (ref 11.5–15.5)
WBC: 19.8 10*3/uL — ABNORMAL HIGH (ref 4.0–10.5)

## 2016-06-15 LAB — I-STAT ARTERIAL BLOOD GAS, ED
Acid-base deficit: 2 mmol/L (ref 0.0–2.0)
Bicarbonate: 21.1 mmol/L (ref 20.0–28.0)
O2 SAT: 95 %
PO2 ART: 72 mmHg — AB (ref 83.0–108.0)
TCO2: 22 mmol/L (ref 0–100)
pCO2 arterial: 29.9 mmHg — ABNORMAL LOW (ref 32.0–48.0)
pH, Arterial: 7.456 — ABNORMAL HIGH (ref 7.350–7.450)

## 2016-06-15 LAB — I-STAT TROPONIN, ED: Troponin i, poc: 0 ng/mL (ref 0.00–0.08)

## 2016-06-15 LAB — I-STAT CG4 LACTIC ACID, ED: Lactic Acid, Venous: 4.85 mmol/L (ref 0.5–1.9)

## 2016-06-15 LAB — TROPONIN I: TROPONIN I: 0.03 ng/mL — AB (ref <0.03)

## 2016-06-15 LAB — GLUCOSE, CAPILLARY: GLUCOSE-CAPILLARY: 333 mg/dL — AB (ref 65–99)

## 2016-06-15 LAB — D-DIMER, QUANTITATIVE (NOT AT ARMC): D DIMER QUANT: 6.92 ug{FEU}/mL — AB (ref 0.00–0.50)

## 2016-06-15 LAB — BRAIN NATRIURETIC PEPTIDE: B Natriuretic Peptide: 25.1 pg/mL (ref 0.0–100.0)

## 2016-06-15 MED ORDER — ENOXAPARIN SODIUM 100 MG/ML ~~LOC~~ SOLN
1.0000 mg/kg | Freq: Once | SUBCUTANEOUS | Status: AC
  Administered 2016-06-15: 85 mg via SUBCUTANEOUS
  Filled 2016-06-15: qty 1

## 2016-06-15 MED ORDER — ENOXAPARIN SODIUM 100 MG/ML ~~LOC~~ SOLN
1.0000 mg/kg | Freq: Two times a day (BID) | SUBCUTANEOUS | Status: DC
  Administered 2016-06-16: 85 mg via SUBCUTANEOUS
  Filled 2016-06-15: qty 1

## 2016-06-15 MED ORDER — HYDROCOD POLST-CPM POLST ER 10-8 MG/5ML PO SUER: 5.0000 mL | Freq: Two times a day (BID) | ORAL | Status: DC | PRN

## 2016-06-15 MED ORDER — ATORVASTATIN CALCIUM 80 MG PO TABS
80.0000 mg | ORAL_TABLET | Freq: Every day | ORAL | Status: DC
  Administered 2016-06-16: 80 mg via ORAL
  Filled 2016-06-15: qty 1

## 2016-06-15 MED ORDER — SODIUM CHLORIDE 0.9% FLUSH
3.0000 mL | Freq: Two times a day (BID) | INTRAVENOUS | Status: DC
  Administered 2016-06-16: 3 mL via INTRAVENOUS

## 2016-06-15 MED ORDER — ACETAMINOPHEN 650 MG RE SUPP: 650.0000 mg | Freq: Four times a day (QID) | RECTAL | Status: DC | PRN

## 2016-06-15 MED ORDER — ZOLPIDEM TARTRATE 5 MG PO TABS: 5.0000 mg | ORAL_TABLET | Freq: Every evening | ORAL | Status: DC | PRN

## 2016-06-15 MED ORDER — INSULIN ASPART 100 UNIT/ML ~~LOC~~ SOLN
0.0000 [IU] | Freq: Every day | SUBCUTANEOUS | Status: DC
  Administered 2016-06-15: 4 [IU] via SUBCUTANEOUS

## 2016-06-15 MED ORDER — SERTRALINE HCL 50 MG PO TABS
50.0000 mg | ORAL_TABLET | Freq: Every day | ORAL | Status: DC
Start: 2016-06-15 — End: 2016-06-17
  Administered 2016-06-15 – 2016-06-17 (×3): 50 mg via ORAL
  Filled 2016-06-15 (×3): qty 1

## 2016-06-15 MED ORDER — INSULIN ASPART 100 UNIT/ML ~~LOC~~ SOLN
0.0000 [IU] | Freq: Three times a day (TID) | SUBCUTANEOUS | Status: DC
  Administered 2016-06-16: 8 [IU] via SUBCUTANEOUS
  Administered 2016-06-16 (×2): 5 [IU] via SUBCUTANEOUS
  Administered 2016-06-17: 3 [IU] via SUBCUTANEOUS

## 2016-06-15 MED ORDER — ENSURE ENLIVE PO LIQD
237.0000 mL | Freq: Two times a day (BID) | ORAL | Status: DC
  Administered 2016-06-16: 237 mL via ORAL

## 2016-06-15 MED ORDER — ALBUTEROL SULFATE (2.5 MG/3ML) 0.083% IN NEBU
3.0000 mL | INHALATION_SOLUTION | Freq: Four times a day (QID) | RESPIRATORY_TRACT | Status: DC | PRN
Start: 2016-06-15 — End: 2016-06-17

## 2016-06-15 MED ORDER — ONDANSETRON HCL 4 MG PO TABS: 4.0000 mg | ORAL_TABLET | Freq: Four times a day (QID) | ORAL | Status: DC | PRN

## 2016-06-15 MED ORDER — QUETIAPINE FUMARATE 25 MG PO TABS
50.0000 mg | ORAL_TABLET | Freq: Every day | ORAL | Status: DC
  Administered 2016-06-15 – 2016-06-16 (×2): 50 mg via ORAL
  Filled 2016-06-15 (×2): qty 2

## 2016-06-15 MED ORDER — ONDANSETRON HCL 4 MG/2ML IJ SOLN: 4.0000 mg | Freq: Four times a day (QID) | INTRAMUSCULAR | Status: DC | PRN

## 2016-06-15 MED ORDER — SODIUM CHLORIDE 0.9 % IV SOLN
INTRAVENOUS | Status: DC
  Administered 2016-06-15 – 2016-06-16 (×2): via INTRAVENOUS

## 2016-06-15 MED ORDER — PANTOPRAZOLE SODIUM 40 MG PO TBEC
40.0000 mg | DELAYED_RELEASE_TABLET | Freq: Every day | ORAL | Status: DC
  Administered 2016-06-15 – 2016-06-17 (×3): 40 mg via ORAL
  Filled 2016-06-15 (×3): qty 1

## 2016-06-15 MED ORDER — HYDROCODONE-ACETAMINOPHEN 10-325 MG PO TABS
1.0000 | ORAL_TABLET | Freq: Three times a day (TID) | ORAL | Status: DC
  Administered 2016-06-15 – 2016-06-17 (×5): 1 via ORAL
  Filled 2016-06-15 (×5): qty 1

## 2016-06-15 MED ORDER — ACETAMINOPHEN 325 MG PO TABS: 650.0000 mg | ORAL_TABLET | Freq: Four times a day (QID) | ORAL | Status: DC | PRN

## 2016-06-15 NOTE — ED Notes (Signed)
Attempted to call report x 1  

## 2016-06-15 NOTE — ED Notes (Signed)
EKG given to Dr. Venora Maples

## 2016-06-15 NOTE — Patient Instructions (Signed)
Patient advised to go to emergency room for further evaluation.

## 2016-06-15 NOTE — Progress Notes (Signed)
ANTICOAGULATION CONSULT NOTE - Initial Consult  Pharmacy Consult for Lovenox Indication: rule out VTE  Allergies  Allergen Reactions  . Oxycodone-Acetaminophen Other (See Comments)    confusion  . Percocet [Oxycodone-Acetaminophen]     Wild hallucinations. He states he has tolerated plain oxycodone in the past.   . Sulfa Antibiotics   . Sulfamethoxazole Rash    Patient Measurements:    Vital Signs: Temp: 98 F (36.7 C) (01/15 1656) Temp Source: Oral (01/15 1656) BP: 125/78 (01/15 1730) Pulse Rate: 58 (01/15 1730)  Labs:  Recent Labs  06/15/16 1415  HGB 14.4  HCT 41.5  PLT 181  CREATININE 1.93*    Estimated Creatinine Clearance: 31.4 mL/min (by C-G formula based on SCr of 1.93 mg/dL (H)).   Medical History: Past Medical History:  Diagnosis Date  . Arthritis   . Asthma    AS CHILD ONLY   . GERD (gastroesophageal reflux disease)   . Hearing loss   . Hyperlipidemia   . Kidney stone   . Pneumonia    hx of PNA    Medications:   (Not in a hospital admission) Scheduled:  Infusions:   Assessment: 83yo male presents with pleuritic CP and DOE. Pharmacy is consulted to dose lovenox for rule out VTE.  Patient received lovenox 1mg /kg once in the ED.  Goal of Therapy:  Monitor platelets by anticoagulation protocol: Yes   Plan:  Lovenox 1mg /kg subcutaneously q12h Monitor s/sx of bleeding F/u imaging  Andrey Cota. Diona Foley, PharmD, BCPS Clinical Pharmacist Pager [phone] 06/15/2016,5:33 PM

## 2016-06-15 NOTE — Progress Notes (Signed)
Patient arrived to 2W09.  Telemetry monitor applied and CCMD notified.  Patient oriented to unit and room to include call light and phone.  Will continue to monitor.

## 2016-06-15 NOTE — ED Notes (Addendum)
EDP aware of lactic acid 4.85.

## 2016-06-15 NOTE — ED Notes (Signed)
Patient transported to Radiology 

## 2016-06-15 NOTE — ED Notes (Signed)
BNP to be added on per main lab.

## 2016-06-15 NOTE — Progress Notes (Signed)
   Subjective:    Patient ID: Roberto Reid, male    DOB: September 18, 1932, 83 y.o.   MRN: [id number]  HPI  83 year old White Male who has had issues with bronchospasm and wheezing since mid-November. He was initially treated with Rocephin Sterapred albuterol inhaler Hycodan and Levaquin. He was diagnosed at that time a left lower lobe pneumonia.  He had another bout of acute bronchitis on December 14.  He had repeat chest x-ray December 15 showing resolution of pneumonia. Once again was treated with Levaquin and steroids.  He returned to the office January 2, complaining of weakness decreased appetite shortness of breath and wheezing. He said he had low-grade fever of 9900.  He also had vascular study by Dr. Fredna Dow with possible left fourth digit embolus. This has improved. He is supposed to be taking aspirin daily.  Multiple bouts of pneumonia over the years beginning at age 32 as a child.  On January 2 was given IM Rocephin, Sterapred 10 mg 12 day dosepak and treated with doxycycline twice daily for 10 days. He saw Dr. Alva Garnet on January 4. Patient has remote history of smoking. He used to smoke three-quarter pack per day and quit in 1993.  History of hypertension, impaired glucose tolerance, hearing loss, hyperlipidemia.  Dr. Jamal Collin thought he had asthmatic bronchitis with recurrent bouts of acute bronchitis. He was noted to be tachycardic. In retrospect, he was tachycardic at visits of January 2 in January 3 here in this office as well.  EKG today shows right bundle branch block and sinus tachycardia with rate of 110. It similar to EKG of May 2016 with the exception of tachycardia. Does not appear to be in atrial fibrillation. Rhythm is fairly regular.  Has seen Dr. Lia Foyer in 2012. He had an echo in 2008 that showed normal LV systolic function and mild fibrocalcific change of the aortic root. He had a nuclear study in 2008 demonstrating 5 minutes of exercise with right bundle branch block  with no ST changes.    Review of Systems     Objective:   Physical Exam Skin warm and dry. Nodes none. Chest fairly clear to auscultation without rales or wheezing. Cardiac exam tachycardia with occasional extra systole. No significant lower extremity edema.       Assessment & Plan:  Sinus tachycardia  Dyspnea on exertion  History of recurrent pneumonia  Essential hypertension  Hyperlipidemia  Impaired glucose tolerance  Patient is being sent to the emergency department for further evaluation including CT of the chest rule out pulmonary embolus although he does not appear to be hypoxic at this point in time. I have asked [facility] to see him as well.

## 2016-06-15 NOTE — ED Provider Notes (Signed)
5:59 PM Right lower extremity is POSITIVE for DVT. On lovenox. hospitalist updated     Jola Schmidt, MD 06/15/16 1759

## 2016-06-15 NOTE — Progress Notes (Signed)
*  Preliminary Results* Bilateral lower extremity venous duplex completed. The right lower extremity is positive for acute deep vein thrombosis involving the right saphenofemoral junction, right common femoral, right femoral, right popliteal, right posterior tibial, right peroneal, and right gastrocnemius veins. The left lower extremity is negative for deep vein thrombosis. There is no evidence of Baker's cyst bilaterally.  Preliminary results discussed with Dr. Venora Maples.  06/15/2016 6:04 PM Maudry Mayhew, BS, RVT, RDCS, RDMS

## 2016-06-15 NOTE — ED Notes (Signed)
Pt given ice chips per Brooke(RN)

## 2016-06-15 NOTE — H&P (Signed)
History and Physical    Roberto Reid BJY:[id number] DOB: April 05, 1933 DOA: 06/15/2016  Referring MD/NP/PA: Venora Maples PCP: Elby Showers, MD Outpatient Specialists:  Patient coming from: home  Chief Complaint: shortness of breath-- worse with exertion  HPI: Roberto Reid is a 83 y.o. male with medical history significant of GERd, HLD, arthritis who has been sick on and off since November.  He was initially diagnosed mid November with LLL PNA- given rocephin, prednisone, albuterol and hycodan as well as levaquin.    He had another bout of acute bronchitis on December 14. Chest x-ray December 15 showed resolution of pneumonia- again was treated with Levaquin and steroids.  He returned to his PCP on January 2, complaining of weakness decreased appetite, shortness of breath, and wheezing. He said he had low-grade fever of 99.  At that time, he was given IM Rocephin, prednisone dosepak and treated with doxycycline twice daily for 10 days. He saw Dr. Alva Garnet on January 4 who did PFTs that were normal.  Recently, he has also had a vascular study by Dr. Fredna Dow with possible left fourth digit embolus.  He is taking aspirin daily for this.  Last stress test was in 2008 which was negative- Dr. Lia Foyer  Patient denies chest pain, palpitations, no recent wheezing, no fever, no chills.  + increase thirst and increase urination.    Hospitalist were asked to admit for SOB work up with r/o of PE.   Review of Systems: all systems reviewed, negative unless stated above in HPI   Past Medical History:  Diagnosis Date  . Arthritis   . Asthma    AS CHILD ONLY   . GERD (gastroesophageal reflux disease)   . Hearing loss   . Hyperlipidemia   . Kidney stone   . Pneumonia    hx of PNA    Past Surgical History:  Procedure Laterality Date  . ELBOW SURGERY     RIGHT  . EYE SURGERY     cataract, bilateral 11/10  . FOOT SURGERY Right 2010   right foot "attempted reconstruction" per pt  . KNEE  ARTHROPLASTY  12/14/2011   LEFT KNEE  . KNEE ARTHROSCOPY Right 10/08/2014   Procedure: ARTHROSCOPY KNEE;  Surgeon: Vickey Huger, MD;  Location: [place];  Service: Orthopedics;  Laterality: Right;  . QUADRICEPS TENDON REPAIR Right 10/08/2014   Procedure: REPAIR QUADRICEP TENDON;  Surgeon: Vickey Huger, MD;  Location: [place];  Service: Orthopedics;  Laterality: Right;  . repair torn knee cartilage    . TONSILLECTOMY    . TOTAL KNEE ARTHROPLASTY  12/14/2011   Procedure: TOTAL KNEE ARTHROPLASTY;  Surgeon: Rudean Haskell, MD;  Location: [place];  Service: Orthopedics;  Laterality: Left;     reports that he quit smoking about 24 years ago. His smoking use included Cigarettes. He has never used smokeless tobacco. He reports that he drinks alcohol. He reports that he does not use drugs.  Allergies  Allergen Reactions  . Oxycodone-Acetaminophen Other (See Comments)    confusion  . Percocet [Oxycodone-Acetaminophen]     Wild hallucinations. He states he has tolerated plain oxycodone in the past.   . Sulfa Antibiotics   . Sulfamethoxazole Rash    Family History  Problem Relation Age of Onset  . Parkinson's disease Father      Prior to Admission medications   Medication Sig Start Date End Date Taking? Authorizing Provider  albuterol (PROVENTIL HFA;VENTOLIN HFA) 108 (90 Base) MCG/ACT inhaler Inhale 2 puffs into the lungs every  6 (six) hours as needed for wheezing or shortness of breath. 04/15/16  Yes Elby Showers, MD  amLODipine (NORVASC) 5 MG tablet Take 1 tablet (5 mg total) by mouth daily. 05/04/16  Yes Elby Showers, MD  atorvastatin (LIPITOR) 80 MG tablet Take 1 tablet (80 mg total) by mouth daily at 6 PM. 10/14/15  Yes Elby Showers, MD  chlorpheniramine-HYDROcodone [employer] PENNKINETIC ER) 10-8 MG/5ML SUER Take 5 mLs by mouth every 12 (twelve) hours as needed for cough. 06/02/16  Yes Elby Showers, MD  Coenzyme Q10 (CO Q-10 PO) Take 1 tablet by mouth daily. 300 mg   Yes Historical Provider, MD    esomeprazole (NEXIUM) 40 MG capsule Take 1 capsule (40 mg total) by mouth 2 (two) times daily. 02/26/12  Yes Elby Showers, MD  HYDROcodone-acetaminophen (NORCO) 10-325 MG tablet Take 1 tablet by mouth every 8 (eight) hours. 05/08/16  Yes Elby Showers, MD  ibuprofen (ADVIL,MOTRIN) 800 MG tablet Take 1 tablet (800 mg total) by mouth every 8 (eight) hours as needed. 10/08/14  Yes Carlynn Spry, PA-C  loratadine (CLARITIN) 10 MG tablet Take 10 mg by mouth daily as needed for allergies.   Yes Historical Provider, MD  losartan (COZAAR) 100 MG tablet TAKE 1 TABLET BY MOUTH EVERY DAY 01/25/16  Yes Elby Showers, MD  metFORMIN (GLUCOPHAGE) 500 MG tablet TAKE 1 TABLET BY MOUTH EVERY MORNING 01/25/16  Yes Elby Showers, MD  Multiple Vitamins-Minerals (MULTIVITAMIN WITH MINERALS) tablet Take 1 tablet by mouth daily.   Yes Historical Provider, MD  Omega-3 Fatty Acids (FISH OIL PO) Take by mouth.   Yes Historical Provider, MD  ondansetron (ZOFRAN) 4 MG tablet Take 1 tablet (4 mg total) by mouth every 8 (eight) hours as needed for nausea or vomiting. 06/02/16  Yes Elby Showers, MD  QUEtiapine (SEROQUEL) 50 MG tablet Take 1 tablet (50 mg total) by mouth at bedtime. 04/06/16  Yes Elby Showers, MD  sertraline (ZOLOFT) 50 MG tablet Take 1 tablet (50 mg total) by mouth daily. 03/10/16  Yes Elby Showers, MD  zolpidem (AMBIEN) 10 MG tablet Take 1 tablet (10 mg total) by mouth at bedtime as needed for sleep. 08/20/15  Yes Elby Showers, MD  clonazePAM (KLONOPIN) 0.5 MG tablet Take 1 tablet (0.5 mg total) by mouth 2 (two) times daily as needed for anxiety. Patient not taking: Reported on 06/15/2016 11/12/15   Elby Showers, MD  sildenafil (VIAGRA) 100 MG tablet Take 1 tablet (100 mg total) by mouth See admin instructions. Patient not taking: Reported on 06/15/2016 08/12/15   Elby Showers, MD  Testosterone (ANDROGEL PUMP) 20.25 MG/ACT (1.62%) GEL APPLY SIX PUMPS EVERY DAY AS DIRECTED Patient not taking: Reported on 06/15/2016  07/18/14   Elby Showers, MD  traMADol (ULTRAM) 50 MG tablet Take 1 tablet (50 mg total) by mouth every 8 (eight) hours as needed. Patient not taking: Reported on 06/15/2016 02/21/16   Elby Showers, MD  triamcinolone cream (KENALOG) 0.1 % Apply 1 application topically 3 (three) times daily. Patient not taking: Reported on 06/15/2016 02/21/16   Elby Showers, MD    Physical Exam: Vitals:   06/15/16 1515 06/15/16 1544 06/15/16 1547 06/15/16 1656  BP: 126/63 122/68  126/77  Pulse: 108  (!) 52 102  Resp: 12  12 16   Temp:    98 F (36.7 C)  TempSrc:    Oral  SpO2: 97%  97% 97%  Constitutional: mild increase in work of breathing Vitals:   06/15/16 1515 06/15/16 1544 06/15/16 1547 06/15/16 1656  BP: 126/63 122/68  126/77  Pulse: 108  (!) 52 102  Resp: 12  12 16   Temp:    98 F (36.7 C)  TempSrc:    Oral  SpO2: 97%  97% 97%   Eyes: PERRL, lids and conjunctivae normal ENMT: Mucous membranes are moist. Posterior pharynx clear of any exudate or lesions.Normal dentition.  Neck: normal, supple, no masses, no thyromegaly Respiratory: clear to auscultation bilaterally, no wheezing, no crackles. Normal respiratory effort. No accessory muscle use.  Cardiovascular: Regular rate and rhythm, no murmurs / rubs / gallops. Minimal LE edema Abdomen: no tenderness, no masses palpated. No hepatosplenomegaly. Bowel sounds positive.  Musculoskeletal: no clubbing / cyanosis. No joint deformity upper and lower extremities. Good ROM, no contractures. Normal muscle tone.  Skin: chronic skin changes in b/l LE Neurologic: CN 2-12 grossly intact. Sensation intact, DTR normal. Strength 5/5 in all 4.  Psychiatric: Normal judgment and insight. Alert and oriented x 3. Normal mood.    Labs on Admission: I have personally reviewed following labs and imaging studies  CBC:  Recent Labs Lab 06/15/16 1415  WBC 19.8*  HGB 14.4  HCT 41.5  MCV 93.3  PLT 939   Basic Metabolic Panel:  Recent Labs Lab  06/15/16 1415  NA 131*  K 4.6  CL 94*  CO2 23  GLUCOSE 339*  BUN 44*  CREATININE 1.93*  CALCIUM 8.8*   GFR: Estimated Creatinine Clearance: 31.4 mL/min (by C-G formula based on SCr of 1.93 mg/dL (H)). Liver Function Tests: No results for input(s): AST, ALT, ALKPHOS, BILITOT, PROT, ALBUMIN in the last 168 hours. No results for input(s): LIPASE, AMYLASE in the last 168 hours. No results for input(s): AMMONIA in the last 168 hours. Coagulation Profile: No results for input(s): INR, PROTIME in the last 168 hours. Cardiac Enzymes: No results for input(s): CKTOTAL, CKMB, CKMBINDEX, TROPONINI in the last 168 hours. BNP (last 3 results) No results for input(s): PROBNP in the last 8760 hours. HbA1C: No results for input(s): HGBA1C in the last 72 hours. CBG: No results for input(s): GLUCAP in the last 168 hours. Lipid Profile: No results for input(s): CHOL, HDL, LDLCALC, TRIG, CHOLHDL, LDLDIRECT in the last 72 hours. Thyroid Function Tests: No results for input(s): TSH, T4TOTAL, FREET4, T3FREE, THYROIDAB in the last 72 hours. Anemia Panel: No results for input(s): VITAMINB12, FOLATE, FERRITIN, TIBC, IRON, RETICCTPCT in the last 72 hours. Urine analysis:    Component Value Date/Time   COLORURINE YELLOW 12/07/2011 [place] 12/07/2011 1052   LABSPEC 1.023 12/07/2011 1052   PHURINE 5.5 12/07/2011 1052   GLUCOSEU NEGATIVE 12/07/2011 1052   HGBUR NEGATIVE 12/07/2011 1052   BILIRUBINUR neg 08/23/2015 1127   KETONESUR NEGATIVE 12/07/2011 1052   PROTEINUR neg 08/23/2015 1127   PROTEINUR NEGATIVE 12/07/2011 1052   UROBILINOGEN 0.2 08/23/2015 1127   UROBILINOGEN 0.2 12/07/2011 1052   NITRITE neg 08/23/2015 1127   NITRITE NEGATIVE 12/07/2011 1052   LEUKOCYTESUR Negative 08/23/2015 1127   Sepsis Labs: Invalid input(s): PROCALCITONIN, LACTICIDVEN No results found for this or any previous visit (from the past 240 hour(s)).   Radiological Exams on Admission: Dg Chest 2  View  Result Date: 06/15/2016 CLINICAL DATA:  Shortness of breath 3 days EXAM: CHEST  2 VIEW COMPARISON:  06/02/2016 FINDINGS: The heart size and mediastinal contours are within normal limits. Both lungs are clear. The visualized skeletal  structures are unremarkable. IMPRESSION: No active cardiopulmonary disease. Electronically Signed   By: Kathreen Devoid   On: 06/15/2016 14:25   Ct Chest Wo Contrast  Result Date: 06/15/2016 CLINICAL DATA:  Shortness of breath. Leukocytosis. Clear chest x-ray with concern for occult pneumonia. EXAM: CT CHEST WITHOUT CONTRAST TECHNIQUE: Multidetector CT imaging of the chest was performed following the standard protocol without IV contrast. COMPARISON:  None. FINDINGS: Cardiovascular: No cardiomegaly or pericardial effusion. Atherosclerosis. Moderate aortic valve calcification. Mediastinum/Nodes: Negative for adenopathy or mass Lungs/Pleura: No consolidation or pulmonary edema. No effusion or pneumothorax. Nonspecific subpleural reticulation is likely combination of dependent atelectasis and scarring. Right lower lobe segmental airways are mildly undulating, but there is no honeycombing to suggest a primary fibrotic process. No suspicious pulmonary nodule. Minimal patchy ground-glass density over the diaphragm bilaterally is likely atelectasis. Upper Abdomen: Cholelithiasis.  No acute finding. Musculoskeletal: No acute or aggressive finding IMPRESSION: 1. Negative for pneumonia or other acute finding. 2. Cholelithiasis and other chronic findings are described above. Electronically Signed   By: Monte Fantasia M.D.   On: 06/15/2016 15:45    EKG: Independently reviewed. Appears to be sinus tach-- slightly irregular in another  Assessment/Plan Active Problems:   Lactic acidosis   Hyperglycemia   Dyspnea   Leukocytosis   Hyponatremia   AKI (acute kidney injury) ([place])  Dyspnea- unclear etiology-- lungs clear-- does not appear to be HF, no sign of PNA on x ray or  non-contrasted CT scan -BNP low so doubt heart failure -does NOT appear to be in A fib but will place on tele monitor and consult cards if needed -echo ordered -recently had PFTs with Dr. Alva Garnet--- normal per report -high susp for PE-- duplex and V/Q scan ordered -will treat with lovenox until proven otherwise -SOB mainly with exertion, may need stress test if PE negative -cycle troponin  Lactic acidosis -on metformin -IVF and recheck in AM  Hyponatremia -corrects with elevated glucose  Hyperglycemia -recently on steroids -SSI while in hospital -HgbA1C  leukocytosis -recently on steroids  AKI on CKD -IVF -recheck in AM   DVT prophylaxis: lovenox-- full dose Code Status: full Family Communication: family at bedside Disposition Plan:  Consults called:  Admission status: inpt   [place] DO [facility] Pager [phone]  If 7PM-7AM, please contact night-coverage [email or web address] Password [facility]  06/15/2016, 5:27 PM

## 2016-06-15 NOTE — ED Triage Notes (Signed)
Sob that strated a few days ago no n/v/cp,  Mild cough,  States has been going on for a few months

## 2016-06-15 NOTE — ED Provider Notes (Signed)
[facility] DEPT Provider Note   CSN: [id number] Arrival date & time: 06/15/16  1403     History   Chief Complaint Chief Complaint  Patient presents with  . Shortness of Breath    HPI Roberto Reid is a 83 y.o. male.  HPI Patient reports pleuritic chest pain and new exertional shortness of breath over the past 3-4 days.  Over the past month his been imminently treated for pneumonia and bronchitis and is overall not felt well.  One week ago he had increasing right leg pain which now seems to resolved.  Over the past 3 days his had significant exertional shortness of breath.  He states he can barely walk 5 feet without being extremely winded.  No history DVT or pulmonary embolism.  He does report ongoing cough and low-grade fevers.  He did fly several weeks ago.  Denies abdominal pain at this time.  No other complaints.  Denies orthopnea.  No history of congestive heart failure   Past Medical History:  Diagnosis Date  . Arthritis   . Asthma    AS CHILD ONLY   . GERD (gastroesophageal reflux disease)   . Hearing loss   . Hyperlipidemia   . Kidney stone   . Pneumonia    hx of PNA    Patient Active Problem List   Diagnosis Date Noted  . Rupture of skeletal muscle 12/13/2014  . Gonalgia 09/27/2014  . Atopic dermatitis 11/25/2012  . H/O total knee replacement 07/26/2012  . Hypertension 02/26/2011  . Hyperlipidemia 02/26/2011  . Diabetes mellitus 02/26/2011  . Osteoarthritis 02/26/2011  . GE reflux 02/26/2011  . Erectile dysfunction 02/26/2011    Past Surgical History:  Procedure Laterality Date  . ELBOW SURGERY     RIGHT  . EYE SURGERY     cataract, bilateral 11/10  . FOOT SURGERY Right 2010   right foot "attempted reconstruction" per pt  . KNEE ARTHROPLASTY  12/14/2011   LEFT KNEE  . KNEE ARTHROSCOPY Right 10/08/2014   Procedure: ARTHROSCOPY KNEE;  Surgeon: Vickey Huger, MD;  Location: [place];  Service: Orthopedics;  Laterality: Right;  . QUADRICEPS TENDON  REPAIR Right 10/08/2014   Procedure: REPAIR QUADRICEP TENDON;  Surgeon: Vickey Huger, MD;  Location: [place];  Service: Orthopedics;  Laterality: Right;  . repair torn knee cartilage    . TONSILLECTOMY    . TOTAL KNEE ARTHROPLASTY  12/14/2011   Procedure: TOTAL KNEE ARTHROPLASTY;  Surgeon: Rudean Haskell, MD;  Location: [place];  Service: Orthopedics;  Laterality: Left;       Home Medications    Prior to Admission medications   Medication Sig Start Date End Date Taking? Authorizing Provider  albuterol (PROVENTIL HFA;VENTOLIN HFA) 108 (90 Base) MCG/ACT inhaler Inhale 2 puffs into the lungs every 6 (six) hours as needed for wheezing or shortness of breath. 04/15/16  Yes Elby Showers, MD  amLODipine (NORVASC) 5 MG tablet Take 1 tablet (5 mg total) by mouth daily. 05/04/16  Yes Elby Showers, MD  atorvastatin (LIPITOR) 80 MG tablet Take 1 tablet (80 mg total) by mouth daily at 6 PM. 10/14/15  Yes Elby Showers, MD  chlorpheniramine-HYDROcodone [facility] PENNKINETIC ER) 10-8 MG/5ML SUER Take 5 mLs by mouth every 12 (twelve) hours as needed for cough. 06/02/16  Yes Elby Showers, MD  Coenzyme Q10 (CO Q-10 PO) Take 1 tablet by mouth daily. 300 mg   Yes Historical Provider, MD  esomeprazole (NEXIUM) 40 MG capsule Take 1 capsule (40  mg total) by mouth 2 (two) times daily. 02/26/12  Yes Elby Showers, MD  HYDROcodone-acetaminophen (NORCO) 10-325 MG tablet Take 1 tablet by mouth every 8 (eight) hours. 05/08/16  Yes Elby Showers, MD  ibuprofen (ADVIL,MOTRIN) 800 MG tablet Take 1 tablet (800 mg total) by mouth every 8 (eight) hours as needed. 10/08/14  Yes Carlynn Spry, PA-C  loratadine (CLARITIN) 10 MG tablet Take 10 mg by mouth daily as needed for allergies.   Yes Historical Provider, MD  losartan (COZAAR) 100 MG tablet TAKE 1 TABLET BY MOUTH EVERY DAY 01/25/16  Yes Elby Showers, MD  metFORMIN (GLUCOPHAGE) 500 MG tablet TAKE 1 TABLET BY MOUTH EVERY MORNING 01/25/16  Yes Elby Showers, MD  Multiple  Vitamins-Minerals (MULTIVITAMIN WITH MINERALS) tablet Take 1 tablet by mouth daily.   Yes Historical Provider, MD  Omega-3 Fatty Acids (FISH OIL PO) Take by mouth.   Yes Historical Provider, MD  ondansetron (ZOFRAN) 4 MG tablet Take 1 tablet (4 mg total) by mouth every 8 (eight) hours as needed for nausea or vomiting. 06/02/16  Yes Elby Showers, MD  QUEtiapine (SEROQUEL) 50 MG tablet Take 1 tablet (50 mg total) by mouth at bedtime. 04/06/16  Yes Elby Showers, MD  sertraline (ZOLOFT) 50 MG tablet Take 1 tablet (50 mg total) by mouth daily. 03/10/16  Yes Elby Showers, MD  zolpidem (AMBIEN) 10 MG tablet Take 1 tablet (10 mg total) by mouth at bedtime as needed for sleep. 08/20/15  Yes Elby Showers, MD  clonazePAM (KLONOPIN) 0.5 MG tablet Take 1 tablet (0.5 mg total) by mouth 2 (two) times daily as needed for anxiety. Patient not taking: Reported on 06/15/2016 11/12/15   Elby Showers, MD  sildenafil (VIAGRA) 100 MG tablet Take 1 tablet (100 mg total) by mouth See admin instructions. Patient not taking: Reported on 06/15/2016 08/12/15   Elby Showers, MD  Testosterone (ANDROGEL PUMP) 20.25 MG/ACT (1.62%) GEL APPLY SIX PUMPS EVERY DAY AS DIRECTED Patient not taking: Reported on 06/15/2016 07/18/14   Elby Showers, MD  traMADol (ULTRAM) 50 MG tablet Take 1 tablet (50 mg total) by mouth every 8 (eight) hours as needed. Patient not taking: Reported on 06/15/2016 02/21/16   Elby Showers, MD  triamcinolone cream (KENALOG) 0.1 % Apply 1 application topically 3 (three) times daily. Patient not taking: Reported on 06/15/2016 02/21/16   Elby Showers, MD    Family History Family History  Problem Relation Age of Onset  . Parkinson's disease Father     Social History Social History  Substance Use Topics  . Smoking status: Former Smoker    Types: Cigarettes    Quit date: 12/14/1991  . Smokeless tobacco: Never Used  . Alcohol use Yes     Comment: social     Allergies   Oxycodone-acetaminophen; Percocet  [oxycodone-acetaminophen]; Sulfa antibiotics; and Sulfamethoxazole   Review of Systems Review of Systems  All other systems reviewed and are negative.    Physical Exam Updated Vital Signs BP 122/68   Pulse (!) 52   Temp 97.7 F (36.5 C) (Oral)   Resp 12   SpO2 97%   Physical Exam  Constitutional: He is oriented to person, place, and time. He appears well-developed and well-nourished.  HENT:  Head: Normocephalic and atraumatic.  Eyes: EOM are normal.  Neck: Normal range of motion.  Cardiovascular: Normal rate, regular rhythm, normal heart sounds and intact distal pulses.   Pulmonary/Chest: Effort normal and breath  sounds normal. No respiratory distress.  Abdominal: Soft. He exhibits no distension. There is no tenderness.  Musculoskeletal: Normal range of motion. He exhibits no edema or tenderness.  Neurological: He is alert and oriented to person, place, and time.  Skin: Skin is warm and dry.  Psychiatric: He has a normal mood and affect. Judgment normal.  Nursing note and vitals reviewed.    ED Treatments / Results  Labs (all labs ordered are listed, but only abnormal results are displayed) Labs Reviewed  BASIC METABOLIC PANEL - Abnormal; Notable for the following:       Result Value   Sodium 131 (*)    Chloride 94 (*)    Glucose, Bld 339 (*)    BUN 44 (*)    Creatinine, Ser 1.93 (*)    Calcium 8.8 (*)    GFR calc non Af Amer 31 (*)    GFR calc Af Amer 36 (*)    All other components within normal limits  CBC - Abnormal; Notable for the following:    WBC 19.8 (*)    All other components within normal limits  I-STAT CG4 LACTIC ACID, ED - Abnormal; Notable for the following:    Lactic Acid, Venous 4.85 (*)    All other components within normal limits  I-STAT ARTERIAL BLOOD GAS, ED - Abnormal; Notable for the following:    pH, Arterial 7.456 (*)    pCO2 arterial 29.9 (*)    pO2, Arterial 72.0 (*)    All other components within normal limits  BRAIN NATRIURETIC  PEPTIDE  D-DIMER, QUANTITATIVE (NOT AT [facility])  I-STAT TROPOININ, ED    EKG  EKG Interpretation  Date/Time:  Monday June 15 2016 14:45:39 EST Ventricular Rate:  113 PR Interval:  164 QRS Duration: 136 QT Interval:  346 QTC Calculation: 475 R Axis:   -56 Text Interpretation:  Sinus tachycardia with irregular rate RBBB and LAFB ST elevation, consider inferior injury No significant change was found Confirmed by Kaliel Bolds  MD, Lennette Bihari ([id number]) on 06/15/2016 2:54:46 PM       Radiology Dg Chest 2 View  Result Date: 06/15/2016 CLINICAL DATA:  Shortness of breath 3 days EXAM: CHEST  2 VIEW COMPARISON:  06/02/2016 FINDINGS: The heart size and mediastinal contours are within normal limits. Both lungs are clear. The visualized skeletal structures are unremarkable. IMPRESSION: No active cardiopulmonary disease. Electronically Signed   By: Kathreen Devoid   On: 06/15/2016 14:25   Ct Chest Wo Contrast  Result Date: 06/15/2016 CLINICAL DATA:  Shortness of breath. Leukocytosis. Clear chest x-ray with concern for occult pneumonia. EXAM: CT CHEST WITHOUT CONTRAST TECHNIQUE: Multidetector CT imaging of the chest was performed following the standard protocol without IV contrast. COMPARISON:  None. FINDINGS: Cardiovascular: No cardiomegaly or pericardial effusion. Atherosclerosis. Moderate aortic valve calcification. Mediastinum/Nodes: Negative for adenopathy or mass Lungs/Pleura: No consolidation or pulmonary edema. No effusion or pneumothorax. Nonspecific subpleural reticulation is likely combination of dependent atelectasis and scarring. Right lower lobe segmental airways are mildly undulating, but there is no honeycombing to suggest a primary fibrotic process. No suspicious pulmonary nodule. Minimal patchy ground-glass density over the diaphragm bilaterally is likely atelectasis. Upper Abdomen: Cholelithiasis.  No acute finding. Musculoskeletal: No acute or aggressive finding IMPRESSION: 1. Negative for pneumonia  or other acute finding. 2. Cholelithiasis and other chronic findings are described above. Electronically Signed   By: Monte Fantasia M.D.   On: 06/15/2016 15:45    Procedures Procedures (including critical care time)  Medications Ordered in  ED Medications  enoxaparin (LOVENOX) injection 85 mg (not administered)     Initial Impression / Assessment and Plan / ED Course  I have reviewed the triage vital signs and the nursing notes.  Pertinent labs & imaging results that were available during my care of the patient were reviewed by me and considered in my medical decision making (see chart for details).  Clinical Course     Patient be admitted the hospital for ongoing shortness of breath.  Patient will likely need echo as well as either a VQ scan tomorrow or a CT angiogram once his kidney function improves.  I will high suspicion for pulmonary embolism given his significant shortness of breath and a clear lungs noted both on x-ray and CT.  Elevated white blood cell count is likely secondary to steroid use.  Hyperglycemia likely secondary to steroid use.  Patient we admitted for ongoing workup and evaluation.  Final Clinical Impressions(s) / ED Diagnoses   Final diagnoses:  SOB (shortness of breath)  Tachycardia    New Prescriptions New Prescriptions   No medications on file     Jola Schmidt, MD 06/15/16 1643

## 2016-06-16 ENCOUNTER — Inpatient Hospital Stay (HOSPITAL_COMMUNITY): Payer: Medicare Other

## 2016-06-16 ENCOUNTER — Inpatient Hospital Stay (HOSPITAL_BASED_OUTPATIENT_CLINIC_OR_DEPARTMENT_OTHER): Payer: Medicare Other

## 2016-06-16 DIAGNOSIS — R739 Hyperglycemia, unspecified: Secondary | ICD-10-CM | POA: Diagnosis not present

## 2016-06-16 DIAGNOSIS — I2699 Other pulmonary embolism without acute cor pulmonale: Secondary | ICD-10-CM

## 2016-06-16 DIAGNOSIS — R0600 Dyspnea, unspecified: Secondary | ICD-10-CM | POA: Diagnosis not present

## 2016-06-16 DIAGNOSIS — E871 Hypo-osmolality and hyponatremia: Secondary | ICD-10-CM | POA: Diagnosis not present

## 2016-06-16 DIAGNOSIS — N179 Acute kidney failure, unspecified: Secondary | ICD-10-CM | POA: Diagnosis not present

## 2016-06-16 DIAGNOSIS — I824Z1 Acute embolism and thrombosis of unspecified deep veins of right distal lower extremity: Secondary | ICD-10-CM

## 2016-06-16 LAB — ECHOCARDIOGRAM COMPLETE
AO mean calculated velocity dopler: 131 cm/s
AOASC: 31 cm
AOPV: 0.6 m/s
AV Area VTI index: 1.27 cm2/m2
AV Mean grad: 8 mmHg
AV pk vel: 240 cm/s
AVA: 2.53 cm2
AVAREAMEANV: 2.29 cm2
AVAREAMEANVIN: 1.15 cm2/m2
AVAREAVTI: 1.87 cm2
AVCELMEANRAT: 0.73
AVPG: 23 mmHg
CHL CUP AV PEAK INDEX: 0.94
CHL CUP AV VEL: 2.53
CHL CUP TV REG PEAK VELOCITY: 245 cm/s
E decel time: 176 msec
E/e' ratio: 6.85
FS: 52 % — AB (ref 28–44)
HEIGHTINCHES: 70.5 in
IVS/LV PW RATIO, ED: 1.36
LA ID, A-P, ES: 38 mm
LA diam end sys: 38 mm
LA vol A4C: 49.6 ml
LA vol: 38 mL
LADIAMINDEX: 1.9 cm/m2
LAVOLIN: 19 mL/m2
LV E/e' medial: 6.85
LV E/e'average: 6.85
LV PW d: 11 mm — AB (ref 0.6–1.1)
LV TDI E'LATERAL: 7.72
LV TDI E'MEDIAL: 5.22
LV e' LATERAL: 7.72 cm/s
LVOT SV: 89 mL
LVOT VTI: 28.2 cm
LVOT area: 3.14 cm2
LVOT peak grad rest: 8 mmHg
LVOTD: 20 mm
LVOTPV: 143 cm/s
LVOTVTI: 0.81 cm
Lateral S' vel: 12.9 cm/s
MV Dec: 176
MVPKEVEL: 52.9 m/s
RV TAPSE: 21.4 mm
TRMAXVEL: 245 cm/s
VTI: 35 cm
Valve area index: 1.27
Weight: 2852.8 oz

## 2016-06-16 LAB — GLUCOSE, CAPILLARY
GLUCOSE-CAPILLARY: 208 mg/dL — AB (ref 65–99)
GLUCOSE-CAPILLARY: 120 mg/dL — AB (ref 65–99)
Glucose-Capillary: 229 mg/dL — ABNORMAL HIGH (ref 65–99)
Glucose-Capillary: 252 mg/dL — ABNORMAL HIGH (ref 65–99)

## 2016-06-16 LAB — CBC
HCT: 32.6 % — ABNORMAL LOW (ref 39.0–52.0)
Hemoglobin: 11.4 g/dL — ABNORMAL LOW (ref 13.0–17.0)
MCH: 32.3 pg (ref 26.0–34.0)
MCHC: 35 g/dL (ref 30.0–36.0)
MCV: 92.4 fL (ref 78.0–100.0)
PLATELETS: 128 10*3/uL — AB (ref 150–400)
RBC: 3.53 MIL/uL — ABNORMAL LOW (ref 4.22–5.81)
RDW: 14.5 % (ref 11.5–15.5)
WBC: 9.7 10*3/uL (ref 4.0–10.5)

## 2016-06-16 LAB — BASIC METABOLIC PANEL
Anion gap: 7 (ref 5–15)
BUN: 33 mg/dL — AB (ref 6–20)
CHLORIDE: 100 mmol/L — AB (ref 101–111)
CO2: 24 mmol/L (ref 22–32)
CREATININE: 1.36 mg/dL — AB (ref 0.61–1.24)
Calcium: 7.6 mg/dL — ABNORMAL LOW (ref 8.9–10.3)
GFR calc Af Amer: 54 mL/min — ABNORMAL LOW (ref >60)
GFR calc non Af Amer: 47 mL/min — ABNORMAL LOW (ref >60)
Glucose, Bld: 218 mg/dL — ABNORMAL HIGH (ref 65–99)
Potassium: 3.8 mmol/L (ref 3.5–5.1)
Sodium: 131 mmol/L — ABNORMAL LOW (ref 135–145)

## 2016-06-16 LAB — TROPONIN I
Troponin I: 0.03 ng/mL (ref <0.03)
Troponin I: <0.03 ng/mL (ref <0.03)

## 2016-06-16 LAB — LACTIC ACID, PLASMA: LACTIC ACID, VENOUS: 1 mmol/L (ref 0.5–1.9)

## 2016-06-16 MED ORDER — ENOXAPARIN SODIUM 80 MG/0.8ML ~~LOC~~ SOLN: 80.0000 mg | Freq: Two times a day (BID) | SUBCUTANEOUS | Status: DC

## 2016-06-16 MED ORDER — TECHNETIUM TO 99M ALBUMIN AGGREGATED
6.0000 | Freq: Once | INTRAVENOUS | Status: AC | PRN
  Administered 2016-06-16: 6 via INTRAVENOUS

## 2016-06-16 MED ORDER — TECHNETIUM TC 99M DIETHYLENETRIAME-PENTAACETIC ACID: 30.0000 | Freq: Once | INTRAVENOUS | Status: DC | PRN

## 2016-06-16 MED ORDER — APIXABAN 5 MG PO TABS: 5.0000 mg | ORAL_TABLET | Freq: Two times a day (BID) | ORAL | Status: DC

## 2016-06-16 MED ORDER — APIXABAN 5 MG PO TABS
10.0000 mg | ORAL_TABLET | Freq: Two times a day (BID) | ORAL | Status: DC
  Administered 2016-06-16 – 2016-06-17 (×2): 10 mg via ORAL
  Filled 2016-06-16 (×3): qty 2

## 2016-06-16 NOTE — Progress Notes (Signed)
Pt care assumed from [facility]. Pt denies needs at this time. Call bell within reach.   Fritz Pickerel, RN

## 2016-06-16 NOTE — Progress Notes (Signed)
PROGRESS NOTE    Roberto Reid  ZYS:[id number] DOB: 03-08-1933 DOA: 06/15/2016 PCP: Elby Showers, MD   Outpatient Specialists:     Brief Narrative:  Roberto Reid is a 83 y.o. male with medical history significant of GERd, HLD, arthritis who has been sick on and off since November.  He was initially diagnosed mid November with LLL PNA- given rocephin, prednisone, albuterol and hycodan as well as levaquin.    He had another bout of acute bronchitis on December 14. Chest x-ray December 15 showed resolution of pneumonia- again was treated with Levaquin and steroids.  He returned to his PCP on January 2, complaining of weakness decreased appetite, shortness of breath, and wheezing. He said he had low-grade fever of 99.  At that time, he was given IM Rocephin, prednisone dosepak and treated with doxycycline twice daily for 10 days. He saw Dr. Alva Garnet on January 4 who did PFTs that were normal.     Assessment & Plan:   Active Problems:   Lactic acidosis   Hyperglycemia   Dyspnea   Leukocytosis   Hyponatremia   AKI (acute kidney injury) ([place])   Pulmonary emboli (HCC)   PE/DVT (RLE DVT) -lovenox for now-- transition to eliquis/xarelto-- await prior auth/benefits check -echo done: Left ventricle: The cavity size was normal. Wall thickness was   increased in a pattern of moderate LVH. Systolic function was   normal. The estimated ejection fraction was in the range of 60%   to 65%. Wall motion was normal; there were no regional wall   motion abnormalities. Doppler parameters are consistent with   abnormal left ventricular relaxation (grade 1 diastolic   dysfunction). -home O2 study -unsure of what caused DVT/PE-- says he is uptodate on colonoscopy and other cancer markers, denies long car trips/plane ride but has not been as active for last few months due to URIs -sinus arrhythmia on tele  Lactic acidosis -on metformin -IVF and recheck in AM  Hyponatremia -corrects with  elevated glucose  Hyperglycemia -recently on steroids -SSI while in hospital -HgbA1C pending  leukocytosis -resolved  Acute renal insuff. on CKD stage III -IVF- improved to baseline  Thrombocytopenia -recheck CBC in AM -doubt HITT but will need to monitor    DVT prophylaxis:  Fully anticoagulated   Code Status: Full Code   Family Communication: At bedside   Disposition Plan:     Consultants:        Subjective: Has not been up to see if his SOB has improved  Objective: Vitals:   06/15/16 1730 06/15/16 1820 06/15/16 2148 06/16/16 0544  BP: 125/78 127/66 113/65 (!) 122/53  Pulse: (!) 58 (!) 103 (!) 103 88  Resp: 17 18 18 18   Temp:  98.2 F (36.8 C) 98.2 F (36.8 C) 97.8 F (36.6 C)  TempSrc:  Oral Oral Oral  SpO2: 97% 97% 96% 93%  Weight:  80.9 kg (178 lb 4.8 oz)    Height:  5' 10.5" (1.791 m)      Intake/Output Summary (Last 24 hours) at 06/16/16 1215 Last data filed at 06/16/16 0634  Gross per 24 hour  Intake              240 ml  Output              620 ml  Net             -380 ml   Filed Weights   06/15/16 1820  Weight: 80.9 kg (178 lb 4.8  oz)    Examination:  General exam: Appears calm and comfortable  Respiratory system: Clear to auscultation. Respiratory effort normal. Cardiovascular system: S1 & S2 heard, RRR. No JVD, murmurs, rubs, gallops or clicks. No pedal edema. Gastrointestinal system: Abdomen is nondistended, soft and nontender. No organomegaly or masses felt. Normal bowel sounds heard. Central nervous system: Alert and oriented. No focal neurological deficits. Chronic skin changes on b/l LE    Data Reviewed: I have personally reviewed following labs and imaging studies  CBC:  Recent Labs Lab 06/15/16 1415 06/16/16 0556  WBC 19.8* 9.7  HGB 14.4 11.4*  HCT 41.5 32.6*  MCV 93.3 92.4  PLT 181 259*   Basic Metabolic Panel:  Recent Labs Lab 06/15/16 1415 06/16/16 0556  NA 131* 131*  K 4.6 3.8  CL 94*  100*  CO2 23 24  GLUCOSE 339* 218*  BUN 44* 33*  CREATININE 1.93* 1.36*  CALCIUM 8.8* 7.6*   GFR: Estimated Creatinine Clearance: 44 mL/min (by C-G formula based on SCr of 1.36 mg/dL (H)). Liver Function Tests: No results for input(s): AST, ALT, ALKPHOS, BILITOT, PROT, ALBUMIN in the last 168 hours. No results for input(s): LIPASE, AMYLASE in the last 168 hours. No results for input(s): AMMONIA in the last 168 hours. Coagulation Profile: No results for input(s): INR, PROTIME in the last 168 hours. Cardiac Enzymes:  Recent Labs Lab 06/15/16 1828 06/16/16 0018 06/16/16 0556  TROPONINI 0.03* 0.03* <0.03   BNP (last 3 results) No results for input(s): PROBNP in the last 8760 hours. HbA1C: No results for input(s): HGBA1C in the last 72 hours. CBG:  Recent Labs Lab 06/15/16 2146 06/16/16 0615 06/16/16 1117  GLUCAP 333* 229* 208*   Lipid Profile: No results for input(s): CHOL, HDL, LDLCALC, TRIG, CHOLHDL, LDLDIRECT in the last 72 hours. Thyroid Function Tests: No results for input(s): TSH, T4TOTAL, FREET4, T3FREE, THYROIDAB in the last 72 hours. Anemia Panel: No results for input(s): VITAMINB12, FOLATE, FERRITIN, TIBC, IRON, RETICCTPCT in the last 72 hours. Urine analysis:    Component Value Date/Time   COLORURINE YELLOW 12/07/2011 1052   APPEARANCEUR CLEAR 12/07/2011 1052   LABSPEC 1.023 12/07/2011 1052   PHURINE 5.5 12/07/2011 1052   GLUCOSEU NEGATIVE 12/07/2011 1052   HGBUR NEGATIVE 12/07/2011 1052   BILIRUBINUR neg 08/23/2015 1127   KETONESUR NEGATIVE 12/07/2011 1052   PROTEINUR neg 08/23/2015 1127   PROTEINUR NEGATIVE 12/07/2011 1052   UROBILINOGEN 0.2 08/23/2015 1127   UROBILINOGEN 0.2 12/07/2011 1052   NITRITE neg 08/23/2015 1127   NITRITE NEGATIVE 12/07/2011 1052   LEUKOCYTESUR Negative 08/23/2015 1127   Sepsis Labs: @LABRCNTIP (procalcitonin:4,lacticidven:4)  )No results found for this or any previous visit (from the past 240 hour(s)).     Anti-infectives    None       Radiology Studies: Dg Chest 2 View  Result Date: 06/15/2016 CLINICAL DATA:  Shortness of breath 3 days EXAM: CHEST  2 VIEW COMPARISON:  06/02/2016 FINDINGS: The heart size and mediastinal contours are within normal limits. Both lungs are clear. The visualized skeletal structures are unremarkable. IMPRESSION: No active cardiopulmonary disease. Electronically Signed   By: Kathreen Devoid   On: 06/15/2016 14:25   Ct Chest Wo Contrast  Result Date: 06/15/2016 CLINICAL DATA:  Shortness of breath. Leukocytosis. Clear chest x-ray with concern for occult pneumonia. EXAM: CT CHEST WITHOUT CONTRAST TECHNIQUE: Multidetector CT imaging of the chest was performed following the standard protocol without IV contrast. COMPARISON:  None. FINDINGS: Cardiovascular: No cardiomegaly or pericardial effusion. Atherosclerosis. Moderate  aortic valve calcification. Mediastinum/Nodes: Negative for adenopathy or mass Lungs/Pleura: No consolidation or pulmonary edema. No effusion or pneumothorax. Nonspecific subpleural reticulation is likely combination of dependent atelectasis and scarring. Right lower lobe segmental airways are mildly undulating, but there is no honeycombing to suggest a primary fibrotic process. No suspicious pulmonary nodule. Minimal patchy ground-glass density over the diaphragm bilaterally is likely atelectasis. Upper Abdomen: Cholelithiasis.  No acute finding. Musculoskeletal: No acute or aggressive finding IMPRESSION: 1. Negative for pneumonia or other acute finding. 2. Cholelithiasis and other chronic findings are described above. Electronically Signed   By: Monte Fantasia M.D.   On: 06/15/2016 15:45   Nm Pulmonary Perf And Vent  Result Date: 06/16/2016 CLINICAL DATA:  Shortness of breath.  Wheezing. EXAM: NUCLEAR MEDICINE VENTILATION - PERFUSION LUNG SCAN TECHNIQUE: Ventilation images were obtained in multiple projections using inhaled aerosol Tc-52m DTPA. Perfusion  images were obtained in multiple projections after intravenous injection of Tc-68m MAA. RADIOPHARMACEUTICALS:  30 mCi Technetium-74m DTPA aerosol inhalation and 4 mCi Technetium-39m MAA IV COMPARISON:  PA and lateral chest and CT chest 06/15/2016 FINDINGS: Ventilation: No focal ventilation defect. Perfusion: Un matched defects are seen in the right middle lobe and lingula. IMPRESSION: Findings worrisome for pulmonary embolus. Critical Value/emergent results were called by telephone at the time of interpretation on 06/16/2016 at 10:12 am to the patient's nurse, Abran Richard, who verbally acknowledged these results. Electronically Signed   By: Inge Rise M.D.   On: 06/16/2016 10:18        Scheduled Meds: . atorvastatin  80 mg Oral q1800  . enoxaparin (LOVENOX) injection  80 mg Subcutaneous Q12H  . feeding supplement (ENSURE ENLIVE)  237 mL Oral BID BM  . HYDROcodone-acetaminophen  1 tablet Oral Q8H  . insulin aspart  0-15 Units Subcutaneous TID WC  . insulin aspart  0-5 Units Subcutaneous QHS  . pantoprazole  40 mg Oral Daily  . QUEtiapine  50 mg Oral QHS  . sertraline  50 mg Oral Daily  . sodium chloride flush  3 mL Intravenous Q12H   Continuous Infusions:   LOS: 1 day    Time spent: 25 min    [place], DO [facility] Pager [phone]  If 7PM-7AM, please contact night-coverage [email or web address] Password [facility] 06/16/2016, 12:15 PM

## 2016-06-16 NOTE — Progress Notes (Signed)
  Echocardiogram 2D Echocardiogram has been performed.  Roberto Reid 06/16/2016, 10:41 AM

## 2016-06-16 NOTE — Progress Notes (Signed)
[place] for Lovenox Indication: rule out VTE  Allergies  Allergen Reactions  . Oxycodone-Acetaminophen Other (See Comments)    confusion  . Percocet [Oxycodone-Acetaminophen]     Wild hallucinations. He states he has tolerated plain oxycodone in the past.   . Sulfa Antibiotics   . Sulfamethoxazole Rash    Patient Measurements: Height: 5' 10.5" (179.1 cm) Weight: 178 lb 4.8 oz (80.9 kg) IBW/kg (Calculated) : 74.15  Vital Signs: Temp: 98.1 F (36.7 C) (01/16 1353) Temp Source: Oral (01/16 1353) BP: 130/75 (01/16 1353) Pulse Rate: 97 (01/16 1353)  Labs:  Recent Labs  06/15/16 1415 06/15/16 1828 06/16/16 0018 06/16/16 0556  HGB 14.4  --   --  11.4*  HCT 41.5  --   --  32.6*  PLT 181  --   --  128*  CREATININE 1.93*  --   --  1.36*  TROPONINI  --  0.03* 0.03* <0.03    Estimated Creatinine Clearance: 44 mL/min (by C-G formula based on SCr of 1.36 mg/dL (H)).   Assessment: 83yo male on lovenox for LLE DVT. Pharmacy to transition to [facility] -SCr= 1.36 (down), Hg= 11.4, plt= 128 -Last dose of lovenox was 85mg  at 5am  Goal of Therapy:  Monitor platelets by anticoagulation protocol: Yes   Plan:  -Discontinue lovenox -Begin Apixiban tonight- 10mg  po bid for 7 days then 5mg  po bid -CBC in am -will provide patient education  Hildred Laser, Pharm D 06/16/2016 2:01 PM

## 2016-06-16 NOTE — Care Management Note (Signed)
Case Management Note Marvetta Gibbons RN, BSN Unit 2W-Case Manager [phone]  Patient Details  Name: Roberto Reid MRN: [id number] Date of Birth: 1932/08/16  Subjective/Objective:    Pt admitted DVT/PE                Action/Plan: PTA pt lived at home- anticipate return home- referral for Eliquis needs- per insurance check- S/W LESLIE @ [place] # # [phone]   ELIQUIS 5 MG BID   COVER- YES  CO-PAY- $ 74.00  TIER- 3 DRUG  PRIOR APPROVAL- NO  PHARMACY : [facility]   MD notified regarding Eliquis- plan to change to Eliquis- spoke with pt at bedside- regarding coverage- pt given 30 day free card to use on discharge-   Expected Discharge Date:      06/17/16            Expected Discharge Plan:  Home/Self Care  In-House Referral:     Discharge planning Services  CM Consult, Medication Assistance  Post Acute Care Choice:  NA Choice offered to:  NA  DME Arranged:    DME Agency:     HH Arranged:    HH Agency:     Status of Service:  Completed, signed off  If discussed at [employer] of Stay Meetings, dates discussed:    Additional Comments:  Dawayne Patricia, RN 06/16/2016, 2:48 PM

## 2016-06-16 NOTE — Progress Notes (Signed)
Per insurance check on Eliquis S/W LESLIE @ [facility] # # [phone]   ELIQUIS 5 MG BID   COVER- YES  CO-PAY- $ 74.00  TIER- 3 DRUG  PRIOR APPROVAL- NO  PHARMACY : [facility]

## 2016-06-16 NOTE — Discharge Instructions (Addendum)
Information on my medicine - ELIQUIS (apixaban)  This medication education was reviewed with me or my healthcare representative as part of my discharge preparation.  The pharmacist that spoke with me during my hospital stay was:  Dareen Piano, [facility]  Why was Eliquis prescribed for you? Eliquis was prescribed to treat blood clots that may have been found in the veins of your legs (deep vein thrombosis) or in your lungs (pulmonary embolism) and to reduce the risk of them occurring again.  What do You need to know about Eliquis ? The starting dose is 10 mg (two 5 mg tablets) taken TWICE daily for the FIRST SEVEN (7) DAYS, then on 06/25/15  the dose is reduced to ONE 5 mg tablet taken TWICE daily.  Eliquis may be taken with or without food.   Try to take the dose about the same time in the morning and in the evening. If you have difficulty swallowing the tablet whole please discuss with your pharmacist how to take the medication safely.  Take Eliquis exactly as prescribed and DO NOT stop taking Eliquis without talking to the doctor who prescribed the medication.  Stopping may increase your risk of developing a new blood clot.  Refill your prescription before you run out.  After discharge, you should have regular check-up appointments with your healthcare provider that is prescribing your Eliquis.    What do you do if you miss a dose? If a dose of ELIQUIS is not taken at the scheduled time, take it as soon as possible on the same day and twice-daily administration should be resumed. The dose should not be doubled to make up for a missed dose.  Important Safety Information A possible side effect of Eliquis is bleeding. You should call your healthcare provider right away if you experience any of the following: ? Bleeding from an injury or your nose that does not stop. ? Unusual colored urine (red or dark brown) or unusual colored stools (red or black). ? Unusual bruising for unknown  reasons. ? A serious fall or if you hit your head (even if there is no bleeding).  Some medicines may interact with Eliquis and might increase your risk of bleeding or clotting while on Eliquis. To help avoid this, consult your healthcare provider or pharmacist prior to using any new prescription or non-prescription medications, including herbals, vitamins, non-steroidal anti-inflammatory drugs (NSAIDs) and supplements.  This website has more information on Eliquis (apixaban): http://www.eliquis.com/eliquis/home  Diabetes Mellitus and Food It is important for you to manage your blood sugar (glucose) level. Your blood glucose level can be greatly affected by what you eat. Eating healthier foods in the appropriate amounts throughout the day at about the same time each day will help you control your blood glucose level. It can also help slow or prevent worsening of your diabetes mellitus. Healthy eating may even help you improve the level of your blood pressure and reach or maintain a healthy weight. General recommendations for healthful eating and cooking habits include:  Eating meals and snacks regularly. Avoid going long periods of time without eating to lose weight.  Eating a diet that consists mainly of plant-based foods, such as fruits, vegetables, nuts, legumes, and whole grains.  Using low-heat cooking methods, such as baking, instead of high-heat cooking methods, such as deep frying. Work with your dietitian to make sure you understand how to use the Nutrition Facts information on food labels. How can food affect me? Carbohydrates  Carbohydrates affect your blood  glucose level more than any other type of food. Your dietitian will help you determine how many carbohydrates to eat at each meal and teach you how to count carbohydrates. Counting carbohydrates is important to keep your blood glucose at a healthy level, especially if you are using insulin or taking certain medicines for diabetes  mellitus. Alcohol  Alcohol can cause sudden decreases in blood glucose (hypoglycemia), especially if you use insulin or take certain medicines for diabetes mellitus. Hypoglycemia can be a life-threatening condition. Symptoms of hypoglycemia (sleepiness, dizziness, and disorientation) are similar to symptoms of having too much alcohol. If your health care provider has given you approval to drink alcohol, do so in moderation and use the following guidelines:  Women should not have more than one drink per day, and men should not have more than two drinks per day. One drink is equal to: ? 12 oz of beer. ? 5 oz of wine. ? 1 oz of hard liquor.  Do not drink on an empty stomach.  Keep yourself hydrated. Have water, diet soda, or unsweetened iced tea.  Regular soda, juice, and other mixers might contain a lot of carbohydrates and should be counted. What foods are not recommended? As you make food choices, it is important to remember that all foods are not the same. Some foods have fewer nutrients per serving than other foods, even though they might have the same number of calories or carbohydrates. It is difficult to get your body what it needs when you eat foods with fewer nutrients. Examples of foods that you should avoid that are high in calories and carbohydrates but low in nutrients include:  Trans fats (most processed foods list trans fats on the Nutrition Facts label).  Regular soda.  Juice.  Candy.  Sweets, such as cake, pie, doughnuts, and cookies.  Fried foods. What foods can I eat? Eat nutrient-rich foods, which will nourish your body and keep you healthy. The food you should eat also will depend on several factors, including:  The calories you need.  The medicines you take.  Your weight.  Your blood glucose level.  Your blood pressure level.  Your cholesterol level. You should eat a variety of foods, including:  Protein. ? Lean cuts of meat. ? Proteins low in  saturated fats, such as fish, egg whites, and beans. Avoid processed meats.  Fruits and vegetables. ? Fruits and vegetables that may help control blood glucose levels, such as apples, mangoes, and yams.  Dairy products. ? Choose fat-free or low-fat dairy products, such as milk, yogurt, and cheese.  Grains, bread, pasta, and rice. ? Choose whole grain products, such as multigrain bread, whole oats, and brown rice. These foods may help control blood pressure.  Fats. ? Foods containing healthful fats, such as nuts, avocado, olive oil, canola oil, and fish. Does everyone with diabetes mellitus have the same meal plan? Because every person with diabetes mellitus is different, there is not one meal plan that works for everyone. It is very important that you meet with a dietitian who will help you create a meal plan that is just right for you. This information is not intended to replace advice given to you by your health care provider. Make sure you discuss any questions you have with your health care provider.

## 2016-06-16 NOTE — Care Management Obs Status (Signed)
[facility] NOTIFICATION   Patient Details  Name: Roberto Reid MRN: [id number] Date of Birth: [birth year]   Medicare Observation Status Notification Given:  Yes    Dawayne Patricia, RN 06/16/2016, 12:33 PM

## 2016-06-16 NOTE — Care Management CC44 (Signed)
Condition Code 44 Documentation Completed  Patient Details  Name: Roberto Reid MRN: [id number] Date of Birth: [birth year]   Condition Code 44 given:  Yes Patient signature on Condition Code 44 notice:  Yes Documentation of 2 MD's agreement:  Yes Code 44 added to claim:  Yes    Dawayne Patricia, RN 06/16/2016, 12:34 PM

## 2016-06-16 NOTE — Progress Notes (Signed)
Inpatient Diabetes Program Recommendations  AACE/ADA: New Consensus Statement on Inpatient Glycemic Control (2015)  Target Ranges:  Prepandial:   less than 140 mg/dL      Peak postprandial:   less than 180 mg/dL (1-2 hours)      Critically ill patients:  140 - 180 mg/dL   Lab Results  Component Value Date   GLUCAP 208 (H) 06/16/2016   HGBA1C 5.9 (H) 02/18/2016    Review of Glycemic Control:  Results for Roberto Reid, Roberto Reid (MRN [id number]) as of 06/16/2016 13:34  Ref. Range 06/15/2016 21:46 06/16/2016 06:15 06/16/2016 11:17  Glucose-Capillary Latest Ref Range: 65 - 99 mg/dL 333 (H) 229 (H) 208 (H)   Diabetes history: Type 2 diabetes Outpatient Diabetes medications: Metformin 500 mg q AM Current orders for Inpatient glycemic control:  Novolog moderate tid with meals and HS  Inpatient Diabetes Program Recommendations:    While in the hospital may consider adding basal insulin such as Levemir 12 units daily.  Also please consider checking A1C.    Thanks, Adah Perl, RN, BC-ADM Inpatient Diabetes Coordinator Pager [phone] (8a-5p)

## 2016-06-17 DIAGNOSIS — E1165 Type 2 diabetes mellitus with hyperglycemia: Secondary | ICD-10-CM | POA: Diagnosis not present

## 2016-06-17 DIAGNOSIS — N179 Acute kidney failure, unspecified: Secondary | ICD-10-CM | POA: Diagnosis not present

## 2016-06-17 DIAGNOSIS — R000 Tachycardia, unspecified: Secondary | ICD-10-CM | POA: Diagnosis not present

## 2016-06-17 DIAGNOSIS — R0600 Dyspnea, unspecified: Secondary | ICD-10-CM | POA: Diagnosis not present

## 2016-06-17 DIAGNOSIS — E872 Acidosis: Secondary | ICD-10-CM | POA: Diagnosis not present

## 2016-06-17 DIAGNOSIS — I129 Hypertensive chronic kidney disease with stage 1 through stage 4 chronic kidney disease, or unspecified chronic kidney disease: Secondary | ICD-10-CM | POA: Diagnosis not present

## 2016-06-17 DIAGNOSIS — M1990 Unspecified osteoarthritis, unspecified site: Secondary | ICD-10-CM | POA: Diagnosis not present

## 2016-06-17 DIAGNOSIS — I2699 Other pulmonary embolism without acute cor pulmonale: Secondary | ICD-10-CM | POA: Diagnosis not present

## 2016-06-17 DIAGNOSIS — I82411 Acute embolism and thrombosis of right femoral vein: Secondary | ICD-10-CM | POA: Diagnosis not present

## 2016-06-17 DIAGNOSIS — N183 Chronic kidney disease, stage 3 (moderate): Secondary | ICD-10-CM | POA: Diagnosis not present

## 2016-06-17 DIAGNOSIS — R739 Hyperglycemia, unspecified: Secondary | ICD-10-CM | POA: Diagnosis not present

## 2016-06-17 DIAGNOSIS — E871 Hypo-osmolality and hyponatremia: Secondary | ICD-10-CM | POA: Diagnosis not present

## 2016-06-17 LAB — BASIC METABOLIC PANEL
Anion gap: 7 (ref 5–15)
BUN: 24 mg/dL — ABNORMAL HIGH (ref 6–20)
CO2: 25 mmol/L (ref 22–32)
CREATININE: 1.38 mg/dL — AB (ref 0.61–1.24)
Calcium: 7.8 mg/dL — ABNORMAL LOW (ref 8.9–10.3)
Chloride: 102 mmol/L (ref 101–111)
GFR, EST AFRICAN AMERICAN: 53 mL/min — AB (ref >60)
GFR, EST NON AFRICAN AMERICAN: 46 mL/min — AB (ref >60)
Glucose, Bld: 201 mg/dL — ABNORMAL HIGH (ref 65–99)
Potassium: 4.3 mmol/L (ref 3.5–5.1)
Sodium: 134 mmol/L — ABNORMAL LOW (ref 135–145)

## 2016-06-17 LAB — HEMOGLOBIN A1C
HEMOGLOBIN A1C: 10 % — AB (ref 4.8–5.6)
Mean Plasma Glucose: 240 mg/dL

## 2016-06-17 LAB — CBC
HCT: 31.9 % — ABNORMAL LOW (ref 39.0–52.0)
HEMOGLOBIN: 10.8 g/dL — AB (ref 13.0–17.0)
MCH: 32 pg (ref 26.0–34.0)
MCHC: 33.9 g/dL (ref 30.0–36.0)
MCV: 94.4 fL (ref 78.0–100.0)
PLATELETS: 135 10*3/uL — AB (ref 150–400)
RBC: 3.38 MIL/uL — ABNORMAL LOW (ref 4.22–5.81)
RDW: 14.6 % (ref 11.5–15.5)
WBC: 9 10*3/uL (ref 4.0–10.5)

## 2016-06-17 LAB — GLUCOSE, CAPILLARY: GLUCOSE-CAPILLARY: 189 mg/dL — AB (ref 65–99)

## 2016-06-17 MED ORDER — METFORMIN HCL 500 MG PO TABS: 500.0000 mg | ORAL_TABLET | Freq: Two times a day (BID) | ORAL | 1 refills | Status: DC

## 2016-06-17 MED ORDER — APIXABAN 5 MG PO TABS: ORAL_TABLET | ORAL | 0 refills | Status: DC

## 2016-06-17 NOTE — Progress Notes (Signed)
Pt ambulated on room air. Remained 97% and above for duration of walk.   Fritz Pickerel, RN

## 2016-06-17 NOTE — Progress Notes (Signed)
Discussed with the patient and all questioned fully answered. He will call me if any problems arise.  Telemetry removed, IV removed. Given paper prescriptions by Dr. Eliseo Squires.  Fritz Pickerel, RN

## 2016-06-17 NOTE — Discharge Summary (Signed)
Physician Discharge Summary  Roberto Reid ZOX:[id number] DOB: [birth year] DOA: 06/15/2016  PCP: Elby Showers, MD  Admit date: 06/15/2016 Discharge date: 06/17/2016   Recommendations for Outpatient Follow-Up:   1. Follow up blood sugars and titrate metformin/add secondary agent 2. ? Etiology of DVT/PE-- says he is uptodate on all screening-- ? From being less active with recent URIs? CT chest showed no lung mass   Discharge Diagnosis:   Active Problems:   Lactic acidosis   Hyperglycemia   Dyspnea   Leukocytosis   Hyponatremia   AKI (acute kidney injury) ([place])   Pulmonary emboli [facility])   Discharge disposition:  Home.    Discharge Condition: Improved.  Diet recommendation: Low sodium, heart healthy.  Carbohydrate-modified.  Wound care: None.   History of Present Illness:    Roberto Reid is a 83 y.o. male with medical history significant of GERd, HLD, arthritis who has been sick on and off since November.  He was initially diagnosed mid November with LLL PNA- given rocephin, prednisone, albuterol and hycodan as well as levaquin.    He had another bout of acute bronchitis on December 14. Chest x-ray December 15 showed resolution of pneumonia- again was treated with Levaquin and steroids.  He returned to his PCP on January 2, complaining of weakness decreased appetite, shortness of breath, and wheezing. He said he had low-grade fever of 99.  At that time, he was given IM Rocephin, prednisone dosepak and treated with doxycycline twice daily for 10 days. He saw Dr. Alva Garnet on January 4 who did PFTs that were normal.  Recently, he has also had a vascular study by Dr. Fredna Dow with possible left fourth digit embolus.  He is taking aspirin daily for this.  Last stress test was in 2008 which was negative- Dr. Lia Foyer  Patient denies chest pain, palpitations, no recent wheezing, no fever, no chills.  + increase thirst and increase urination.    Hospitalist were asked to admit  for SOB work up with r/o of PE.    Hospital Course by Problem:   PE/DVT (RLE DVT) -lovenox for now-- transition to eliquis/xarelto-- await prior auth/benefits check -echo done: Left ventricle: The cavity size was normal. Wall thickness was increased in a pattern of moderate LVH. Systolic function was normal. The estimated ejection fraction was in the range of 60% to 65%. Wall motion was normal; there were no regional wall motion abnormalities. Doppler parameters are consistent with abnormal left ventricular relaxation (grade 1 diastolic dysfunction). -home O2 study- never < 97 % on RA -unsure of what caused DVT/PE-- says he is uptodate on colonoscopy and other cancer markers, denies long car trips/plane ride but has not been as active for last few months due to URIs -sinus arrhythmia on tele- discussed with on-call cardiology-- not a fib-- sinus with PACs  Lactic acidosis -on metformin -resolved  Hyponatremia -corrects with elevated glucose  Hyperglycemia -recently on steroids -SSI while in hospital -HgbA1C >9-- will increase metformin-- patient to follow up closely with PCP for further titration of metformin + possible addition of 2nd medications  leukocytosis -resolved  Acute renal insuff. on CKD stage III -IVF- improved to baseline  Thrombocytopenia -stable -outpatient CBC to further trend    Medical Consultants:    None.   Discharge Exam:   Vitals:   06/16/16 2116 06/17/16 0522  BP: 129/65 108/61  Pulse: 96 72  Resp: 18 18  Temp: 98.6 F (37 C) 98 F (36.7 C)   Vitals:  06/16/16 0544 06/16/16 1353 06/16/16 2116 06/17/16 0522  BP: (!) 122/53 130/75 129/65 108/61  Pulse: 88 97 96 72  Resp: 18 18 18 18   Temp: 97.8 F (36.6 C) 98.1 F (36.7 C) 98.6 F (37 C) 98 F (36.7 C)  TempSrc: Oral Oral Oral Oral  SpO2: 93% 95% 97% 94%  Weight:      Height:        Gen:  NAD- breathing much easier   The results of significant  diagnostics from this hospitalization (including imaging, microbiology, ancillary and laboratory) are listed below for reference.     Procedures and Diagnostic Studies:   Dg Chest 2 View  Result Date: 06/15/2016 CLINICAL DATA:  Shortness of breath 3 days EXAM: CHEST  2 VIEW COMPARISON:  06/02/2016 FINDINGS: The heart size and mediastinal contours are within normal limits. Both lungs are clear. The visualized skeletal structures are unremarkable. IMPRESSION: No active cardiopulmonary disease. Electronically Signed   By: Kathreen Devoid   On: 06/15/2016 14:25   Ct Chest Wo Contrast  Result Date: 06/15/2016 CLINICAL DATA:  Shortness of breath. Leukocytosis. Clear chest x-ray with concern for occult pneumonia. EXAM: CT CHEST WITHOUT CONTRAST TECHNIQUE: Multidetector CT imaging of the chest was performed following the standard protocol without IV contrast. COMPARISON:  None. FINDINGS: Cardiovascular: No cardiomegaly or pericardial effusion. Atherosclerosis. Moderate aortic valve calcification. Mediastinum/Nodes: Negative for adenopathy or mass Lungs/Pleura: No consolidation or pulmonary edema. No effusion or pneumothorax. Nonspecific subpleural reticulation is likely combination of dependent atelectasis and scarring. Right lower lobe segmental airways are mildly undulating, but there is no honeycombing to suggest a primary fibrotic process. No suspicious pulmonary nodule. Minimal patchy ground-glass density over the diaphragm bilaterally is likely atelectasis. Upper Abdomen: Cholelithiasis.  No acute finding. Musculoskeletal: No acute or aggressive finding IMPRESSION: 1. Negative for pneumonia or other acute finding. 2. Cholelithiasis and other chronic findings are described above. Electronically Signed   By: Monte Fantasia M.D.   On: 06/15/2016 15:45   Nm Pulmonary Perf And Vent  Result Date: 06/16/2016 CLINICAL DATA:  Shortness of breath.  Wheezing. EXAM: NUCLEAR MEDICINE VENTILATION - PERFUSION LUNG SCAN  TECHNIQUE: Ventilation images were obtained in multiple projections using inhaled aerosol Tc-40m DTPA. Perfusion images were obtained in multiple projections after intravenous injection of Tc-27m MAA. RADIOPHARMACEUTICALS:  30 mCi Technetium-5m DTPA aerosol inhalation and 4 mCi Technetium-12m MAA IV COMPARISON:  PA and lateral chest and CT chest 06/15/2016 FINDINGS: Ventilation: No focal ventilation defect. Perfusion: Un matched defects are seen in the right middle lobe and lingula. IMPRESSION: Findings worrisome for pulmonary embolus. Critical Value/emergent results were called by telephone at the time of interpretation on 06/16/2016 at 10:12 am to the patient's nurse, Abran Richard, who verbally acknowledged these results. Electronically Signed   By: Inge Rise M.D.   On: 06/16/2016 10:18     Labs:   Basic Metabolic Panel:  Recent Labs Lab 06/15/16 1415 06/16/16 0556 06/17/16 0348  NA 131* 131* 134*  K 4.6 3.8 4.3  CL 94* 100* 102  CO2 23 24 25   GLUCOSE 339* 218* 201*  BUN 44* 33* 24*  CREATININE 1.93* 1.36* 1.38*  CALCIUM 8.8* 7.6* 7.8*   GFR Estimated Creatinine Clearance: 43.3 mL/min (by C-G formula based on SCr of 1.38 mg/dL (H)). Liver Function Tests: No results for input(s): AST, ALT, ALKPHOS, BILITOT, PROT, ALBUMIN in the last 168 hours. No results for input(s): LIPASE, AMYLASE in the last 168 hours. No results for input(s): AMMONIA in the last  168 hours. Coagulation profile No results for input(s): INR, PROTIME in the last 168 hours.  CBC:  Recent Labs Lab 06/15/16 1415 06/16/16 0556 06/17/16 0348  WBC 19.8* 9.7 9.0  HGB 14.4 11.4* 10.8*  HCT 41.5 32.6* 31.9*  MCV 93.3 92.4 94.4  PLT 181 128* 135*   Cardiac Enzymes:  Recent Labs Lab 06/15/16 1828 06/16/16 0018 06/16/16 0556  TROPONINI 0.03* 0.03* <0.03   BNP: Invalid input(s): POCBNP CBG:  Recent Labs Lab 06/16/16 0615 06/16/16 1117 06/16/16 1647 06/16/16 2108 06/17/16 0617  GLUCAP  229* 208* 252* 120* 189*   D-Dimer  Recent Labs  06/15/16 1648  DDIMER 6.92*   Hgb A1c  Recent Labs  06/16/16 0018  HGBA1C 10.0*   Lipid Profile No results for input(s): CHOL, HDL, LDLCALC, TRIG, CHOLHDL, LDLDIRECT in the last 72 hours. Thyroid function studies No results for input(s): TSH, T4TOTAL, T3FREE, THYROIDAB in the last 72 hours.  Invalid input(s): FREET3 Anemia work up No results for input(s): VITAMINB12, FOLATE, FERRITIN, TIBC, IRON, RETICCTPCT in the last 72 hours. Microbiology No results found for this or any previous visit (from the past 240 hour(s)).   Discharge Instructions:   Discharge Instructions    Diet - low sodium heart healthy    Complete by:  As directed    Diet Carb Modified    Complete by:  As directed    Increase activity slowly    Complete by:  As directed      Allergies as of 06/17/2016      Reactions   Oxycodone-acetaminophen Other (See Comments)   confusion   Percocet [oxycodone-acetaminophen]    Wild hallucinations. He states he has tolerated plain oxycodone in the past.    Sulfa Antibiotics    Sulfamethoxazole Rash      Medication List    STOP taking these medications   amLODipine 5 MG tablet Commonly known as:  NORVASC   clonazePAM 0.5 MG tablet Commonly known as:  KLONOPIN   ibuprofen 800 MG tablet Commonly known as:  ADVIL,MOTRIN   losartan 100 MG tablet Commonly known as:  COZAAR   sildenafil 100 MG tablet Commonly known as:  VIAGRA   Testosterone 20.25 MG/ACT (1.62%) Gel Commonly known as:  ANDROGEL PUMP   traMADol 50 MG tablet Commonly known as:  ULTRAM   triamcinolone cream 0.1 % Commonly known as:  KENALOG   zolpidem 10 MG tablet Commonly known as:  AMBIEN     TAKE these medications   albuterol 108 (90 Base) MCG/ACT inhaler Commonly known as:  PROVENTIL HFA;VENTOLIN HFA Inhale 2 puffs into the lungs every 6 (six) hours as needed for wheezing or shortness of breath.   apixaban 5 MG Tabs  tablet Commonly known as:  ELIQUIS 10 mg BID x 7 days followed by 5 mg PO BID   atorvastatin 80 MG tablet Commonly known as:  LIPITOR Take 1 tablet (80 mg total) by mouth daily at 6 PM.   chlorpheniramine-HYDROcodone 10-8 MG/5ML Suer Commonly known as:  TUSSIONEX PENNKINETIC ER Take 5 mLs by mouth every 12 (twelve) hours as needed for cough.   CO Q-10 PO Take 1 tablet by mouth daily. 300 mg   esomeprazole 40 MG capsule Commonly known as:  NEXIUM Take 1 capsule (40 mg total) by mouth 2 (two) times daily.   FISH OIL PO Take by mouth.   HYDROcodone-acetaminophen 10-325 MG tablet Commonly known as:  NORCO Take 1 tablet by mouth every 8 (eight) hours.   loratadine 10  MG tablet Commonly known as:  CLARITIN Take 10 mg by mouth daily as needed for allergies.   metFORMIN 500 MG tablet Commonly known as:  GLUCOPHAGE Take 1 tablet (500 mg total) by mouth 2 (two) times daily with a meal. What changed:  See the new instructions.   multivitamin with minerals tablet Take 1 tablet by mouth daily.   ondansetron 4 MG tablet Commonly known as:  ZOFRAN Take 1 tablet (4 mg total) by mouth every 8 (eight) hours as needed for nausea or vomiting.   QUEtiapine 50 MG tablet Commonly known as:  SEROQUEL Take 1 tablet (50 mg total) by mouth at bedtime.   sertraline 50 MG tablet Commonly known as:  ZOLOFT Take 1 tablet (50 mg total) by mouth daily.      Follow-up Information    Elby Showers, MD.   Specialty:  Internal Medicine Contact information: [id number] [place] [address] [phone]            Time coordinating discharge: 35 min  Signed:  Chason Mciver U Dempsey Knotek   [facility] 06/17/2016, 10:01 AM

## 2016-06-18 ENCOUNTER — Encounter: Payer: Self-pay | Admitting: Internal Medicine

## 2016-06-18 ENCOUNTER — Telehealth: Payer: Self-pay | Admitting: Internal Medicine

## 2016-06-18 NOTE — Telephone Encounter (Signed)
Discharged yesterday. Is on Eloquis. Plan to follow-up Wednesday, January 24 at 10 AM. Advised no travel until rechecked. Says his legs are weak. Reviewed medications with him. Says hospitalist did not advise him about continuing other medications that he was taking prior to admission. Reviewed medications with him today and told him to continue his other chronic medications.

## 2016-06-22 ENCOUNTER — Telehealth: Payer: Self-pay | Admitting: Internal Medicine

## 2016-06-22 ENCOUNTER — Encounter: Payer: Self-pay | Admitting: Internal Medicine

## 2016-06-22 MED ORDER — HYDROCODONE-ACETAMINOPHEN 10-325 MG PO TABS: 1.0000 | ORAL_TABLET | Freq: Three times a day (TID) | ORAL | 0 refills | Status: DC

## 2016-06-22 NOTE — Telephone Encounter (Addendum)
Rescheduled his appointment from Wednesday to Friday at noon.  AND, he is asking for a refill on his Norco 10-325mg .  He would like to pick up this Rx this afternoon.    Please call patient when Rx is ready to be picked up.    Thank you.    Rx refilled. To see pt. Friday January 26 for transitional care follow up.

## 2016-06-24 ENCOUNTER — Ambulatory Visit: Payer: Medicare Other | Admitting: Internal Medicine

## 2016-06-26 ENCOUNTER — Ambulatory Visit (INDEPENDENT_AMBULATORY_CARE_PROVIDER_SITE_OTHER): Payer: Medicare Other | Admitting: Internal Medicine

## 2016-06-26 ENCOUNTER — Encounter: Payer: Self-pay | Admitting: Internal Medicine

## 2016-06-26 VITALS — BP 110/60 | HR 117 | Temp 98.0°F | Wt 190.0 lb

## 2016-06-26 DIAGNOSIS — E7849 Other hyperlipidemia: Secondary | ICD-10-CM

## 2016-06-26 DIAGNOSIS — R7302 Impaired glucose tolerance (oral): Secondary | ICD-10-CM | POA: Diagnosis not present

## 2016-06-26 DIAGNOSIS — I10 Essential (primary) hypertension: Secondary | ICD-10-CM | POA: Diagnosis not present

## 2016-06-26 DIAGNOSIS — Z8701 Personal history of pneumonia (recurrent): Secondary | ICD-10-CM

## 2016-06-26 DIAGNOSIS — E784 Other hyperlipidemia: Secondary | ICD-10-CM

## 2016-06-26 DIAGNOSIS — I2699 Other pulmonary embolism without acute cor pulmonale: Secondary | ICD-10-CM

## 2016-06-28 NOTE — Patient Instructions (Addendum)
Continue Eloquis as directed. Continue medications as previously prescribed. Take it easy over the next several weeks all clot is dissolving. Avoid long travel if possible. Okay to walk outside and around the house. Low impact exercise for now. Return in 3 months.

## 2016-06-28 NOTE — Progress Notes (Signed)
   Subjective:    Patient ID: Roberto Reid, male    DOB: 02/26/1933, 83 y.o.   MRN: [id number]  HPI 83 year old male in today for transitional care follow-up status post hospitalization recently. He was admitted January 15 after being seen here in the office complaining of being acutely short of breath with. Minimal activity and exertion. He had an elevated d-dimer. CT of the chest did not show pulmonary emboli but VQ scan was abnormal. He had mismatching in right middle lobe and lingula. He is currently on Eloquis. His right lower extremity has begun to swell some. He had significant leukocytosis on admission 19,800. He was hyponatremic with sodium of 131. Creatinine was elevated at 1.93 but improved to 1.36 the following day. Potassium was stable. Chest x-ray was unremarkable.  Previous medical issues  included pneumonia left lower lobe in November which took some time from which to recover. After being diagnosed with that he went to [place] on a business trip mid December. Came down with another bout of acute bronchitis and had to stay at home for some time. He saw Dr. Jamal Collin, pulmonologist in early January. He was noticed to have some mild tachycardia at that time but essentially a normal lung exam. Became acutely ill a few days before admission but did not seek medical attention until January 15.  Although his pulse oximetry was normal, arterial blood gas showed PaO2 of 72. He had significant lactic acidosis of 4.85. Troponin is not elevated  He had a 2-D echocardiogram showing moderate LVH. Wall motion was normal. He had mild aortic stenosis. Pulmonary artery peak pressure was 32 mg of Hg  No significant mitral regurgitation    Review of Systems see above     Objective:   Physical Exam Skin warm and dry. Nodes none. Chest clear. Cardiac exam regular rate and rhythm. He has trace pitting lower extremity edema and evidence of venous disease with superficial varicosities bilaterally. He  has tenderness in the right upper lateral calf area.       Assessment & Plan:  Pulmonary emboli treated with Eloquis  History of pneumonia and bronchospasm  Edema right lower extremity likely secondary to DVT  Hypertertension  Chronic foot pain treated with Norco  History of impaired glucose tolerance  Chronic anticoagulation-will need to be on anticoagulation for a minimum of 3 months and if he considers continuing to travel extensively I would favor 6 months. He's not been evaluated for an inherited clotting disorder. This can be done once he is taken off  of Eloquis.Marland Kitchen

## 2016-06-30 ENCOUNTER — Telehealth (HOSPITAL_COMMUNITY): Payer: Self-pay

## 2016-06-30 NOTE — Telephone Encounter (Signed)
Called to schedule angiogram, left message for pt to return call. AW

## 2016-07-06 ENCOUNTER — Other Ambulatory Visit: Payer: Self-pay

## 2016-07-06 MED ORDER — ONDANSETRON HCL 4 MG PO TABS: 4.0000 mg | ORAL_TABLET | Freq: Three times a day (TID) | ORAL | 0 refills | Status: DC | PRN

## 2016-07-06 NOTE — Telephone Encounter (Signed)
Refill once 

## 2016-07-06 NOTE — Telephone Encounter (Signed)
Requesting a refill last refill 06/02/2016 last OV 06/27/16. Ok to refill?

## 2016-07-16 ENCOUNTER — Telehealth: Payer: Self-pay | Admitting: Internal Medicine

## 2016-07-16 MED ORDER — APIXABAN 5 MG PO TABS: 5.0000 mg | ORAL_TABLET | Freq: Two times a day (BID) | ORAL | 0 refills | Status: DC

## 2016-07-16 NOTE — Telephone Encounter (Signed)
Patient states when he was in the hospital recently, he was prescribed Eloquist 5mg  bid.  States that he needs a Rx called in for this refill.  Are we going to do the refills for this?    Pharmacy:  [facility]

## 2016-07-16 NOTE — Telephone Encounter (Signed)
Refill this for 3 months

## 2016-07-22 ENCOUNTER — Telehealth: Payer: Self-pay | Admitting: Internal Medicine

## 2016-07-22 NOTE — Telephone Encounter (Signed)
Print up 30 day supply. Will need you to weigh him and take BP and I will need to see him briefly Thursday

## 2016-07-22 NOTE — Telephone Encounter (Signed)
Patient calling for a refill on his Norco 10-325mg .  Last filled on 06/22/16.  Advised it would not be printed until Thursday morning.  Patient asked that we call him when it is ready for pick up.    Best number for contact:  [phone]  Thank you.

## 2016-07-23 ENCOUNTER — Ambulatory Visit (INDEPENDENT_AMBULATORY_CARE_PROVIDER_SITE_OTHER): Payer: Medicare Other | Admitting: Internal Medicine

## 2016-07-23 ENCOUNTER — Other Ambulatory Visit: Payer: Self-pay

## 2016-07-23 ENCOUNTER — Encounter: Payer: Self-pay | Admitting: Internal Medicine

## 2016-07-23 VITALS — BP 108/60 | HR 98 | Temp 97.8°F | Ht 71.0 in | Wt 194.5 lb

## 2016-07-23 DIAGNOSIS — E349 Endocrine disorder, unspecified: Secondary | ICD-10-CM | POA: Diagnosis not present

## 2016-07-23 DIAGNOSIS — Z86711 Personal history of pulmonary embolism: Secondary | ICD-10-CM

## 2016-07-23 DIAGNOSIS — M79671 Pain in right foot: Secondary | ICD-10-CM | POA: Diagnosis not present

## 2016-07-23 DIAGNOSIS — R7989 Other specified abnormal findings of blood chemistry: Secondary | ICD-10-CM

## 2016-07-23 DIAGNOSIS — G8929 Other chronic pain: Secondary | ICD-10-CM

## 2016-07-23 MED ORDER — TESTOSTERONE 20.25 MG/1.25GM (1.62%) TD GEL: TRANSDERMAL | 5 refills | Status: DC

## 2016-07-23 MED ORDER — HYDROCODONE-ACETAMINOPHEN 10-325 MG PO TABS: 1.0000 | ORAL_TABLET | Freq: Three times a day (TID) | ORAL | 0 refills | Status: DC

## 2016-07-23 NOTE — Patient Instructions (Signed)
We will attempt to refill AndroGel late your request. Norco 10/325 #90 given to take 1 by mouth every 8 hours when necessary foot pain. Continue anticoagulation for pulmonary embolus. We will arrange for appointment with hematologist for follow-up on that issue. Serum testosterone drawn and pending.

## 2016-07-23 NOTE — Progress Notes (Signed)
He remains on chronic anticoagulation with Eliquis 5 mg bid for recent pulmonary embolus. Was hospitalized January 15 -17. DQ scan showed mismatching. CT of chest did not show PE. We will get appointment with Dr. Beryle Beams to give [place] some advice on how long to leave him on anticoagulation. He is not traveling much at the present time and says he will be traveling much in the foreseeable future. He's feeling better. He's been exercising and trying to get his strength back.  He needs handicap parking permit signed today.  He's here to have chronic pain medication refill for chronic foot pain. He takes Norco 3 times daily. He does not abuse of medication and he indicates his pain control is adequate.  He is also asking for refill on AndroGel. I'm not sure what strength in dose Dr. Jeffie Pollock gave him but I will need to contact alone at urology to get that clarified. I'm not sure week and get it approved. I did draw serum testosterone level today.  Written prescription for Norco10/325 tid #90 given.

## 2016-07-24 LAB — TESTOSTERONE: Testosterone: 568 ng/dL (ref 250–827)

## 2016-07-28 ENCOUNTER — Other Ambulatory Visit: Payer: Self-pay

## 2016-07-28 MED ORDER — ZOLPIDEM TARTRATE 10 MG PO TABS: 10.0000 mg | ORAL_TABLET | Freq: Every day | ORAL | 5 refills | Status: DC

## 2016-08-03 ENCOUNTER — Other Ambulatory Visit: Payer: Self-pay

## 2016-08-03 MED ORDER — LOSARTAN POTASSIUM 100 MG PO TABS: 100.0000 mg | ORAL_TABLET | Freq: Every day | ORAL | 1 refills | Status: DC

## 2016-08-03 MED ORDER — ATORVASTATIN CALCIUM 80 MG PO TABS: 80.0000 mg | ORAL_TABLET | Freq: Every day | ORAL | 1 refills | Status: DC

## 2016-08-20 ENCOUNTER — Telehealth: Payer: Self-pay | Admitting: Internal Medicine

## 2016-08-20 MED ORDER — HYDROCODONE-ACETAMINOPHEN 10-325 MG PO TABS: 1.0000 | ORAL_TABLET | Freq: Three times a day (TID) | ORAL | 0 refills | Status: DC

## 2016-08-20 NOTE — Telephone Encounter (Signed)
Patient calling to request refill on his Hydrocodone prescription.  Please call him when this is ready to be picked up.   Thank you.

## 2016-08-20 NOTE — Telephone Encounter (Signed)
Printed, pt notified to pick up tomorrow.

## 2016-08-20 NOTE — Telephone Encounter (Signed)
Refill Norco 10/325   #90

## 2016-08-24 ENCOUNTER — Ambulatory Visit (INDEPENDENT_AMBULATORY_CARE_PROVIDER_SITE_OTHER): Payer: Medicare Other | Admitting: Oncology

## 2016-08-24 ENCOUNTER — Encounter: Payer: Self-pay | Admitting: Oncology

## 2016-08-24 ENCOUNTER — Encounter (INDEPENDENT_AMBULATORY_CARE_PROVIDER_SITE_OTHER): Payer: Self-pay

## 2016-08-24 VITALS — BP 147/63 | HR 95 | Temp 97.8°F | Ht 70.0 in | Wt 200.3 lb

## 2016-08-24 DIAGNOSIS — Z96652 Presence of left artificial knee joint: Secondary | ICD-10-CM

## 2016-08-24 DIAGNOSIS — K219 Gastro-esophageal reflux disease without esophagitis: Secondary | ICD-10-CM

## 2016-08-24 DIAGNOSIS — Z8489 Family history of other specified conditions: Secondary | ICD-10-CM

## 2016-08-24 DIAGNOSIS — Z8709 Personal history of other diseases of the respiratory system: Secondary | ICD-10-CM

## 2016-08-24 DIAGNOSIS — Z7901 Long term (current) use of anticoagulants: Secondary | ICD-10-CM

## 2016-08-24 DIAGNOSIS — T40605D Adverse effect of unspecified narcotics, subsequent encounter: Secondary | ICD-10-CM

## 2016-08-24 DIAGNOSIS — Z87891 Personal history of nicotine dependence: Secondary | ICD-10-CM | POA: Diagnosis not present

## 2016-08-24 DIAGNOSIS — I824Y1 Acute embolism and thrombosis of unspecified deep veins of right proximal lower extremity: Secondary | ICD-10-CM

## 2016-08-24 DIAGNOSIS — Z79891 Long term (current) use of opiate analgesic: Secondary | ICD-10-CM

## 2016-08-24 DIAGNOSIS — M79671 Pain in right foot: Secondary | ICD-10-CM

## 2016-08-24 DIAGNOSIS — Z79899 Other long term (current) drug therapy: Secondary | ICD-10-CM

## 2016-08-24 DIAGNOSIS — Z885 Allergy status to narcotic agent status: Secondary | ICD-10-CM

## 2016-08-24 DIAGNOSIS — Z882 Allergy status to sulfonamides status: Secondary | ICD-10-CM

## 2016-08-24 DIAGNOSIS — Z833 Family history of diabetes mellitus: Secondary | ICD-10-CM

## 2016-08-24 DIAGNOSIS — K5903 Drug induced constipation: Secondary | ICD-10-CM

## 2016-08-24 DIAGNOSIS — G8929 Other chronic pain: Secondary | ICD-10-CM

## 2016-08-24 DIAGNOSIS — I2699 Other pulmonary embolism without acute cor pulmonale: Secondary | ICD-10-CM

## 2016-08-24 HISTORY — DX: Acute embolism and thrombosis of unspecified deep veins of right proximal lower extremity: I82.4Y1

## 2016-08-24 LAB — CBC WITH DIFFERENTIAL/PLATELET
BASOS ABS: 0 10*3/uL (ref 0.0–0.1)
BASOS PCT: 1 %
Eosinophils Absolute: 0.3 10*3/uL (ref 0.0–0.7)
Eosinophils Relative: 4 %
HEMATOCRIT: 41.3 % (ref 39.0–52.0)
HEMOGLOBIN: 13.8 g/dL (ref 13.0–17.0)
Lymphocytes Relative: 28 %
Lymphs Abs: 2.1 10*3/uL (ref 0.7–4.0)
MCH: 32.1 pg (ref 26.0–34.0)
MCHC: 33.4 g/dL (ref 30.0–36.0)
MCV: 96 fL (ref 78.0–100.0)
MONOS PCT: 8 %
Monocytes Absolute: 0.6 10*3/uL (ref 0.1–1.0)
NEUTROS ABS: 4.5 10*3/uL (ref 1.7–7.7)
NEUTROS PCT: 59 %
Platelets: 249 10*3/uL (ref 150–400)
RBC: 4.3 MIL/uL (ref 4.22–5.81)
RDW: 12.7 % (ref 11.5–15.5)
WBC: 7.4 10*3/uL (ref 4.0–10.5)

## 2016-08-24 LAB — SAVE SMEAR

## 2016-08-24 LAB — COMPREHENSIVE METABOLIC PANEL
ALBUMIN: 3.5 g/dL (ref 3.5–5.0)
ALK PHOS: 33 U/L — AB (ref 38–126)
ALT: 10 U/L — AB (ref 17–63)
AST: 17 U/L (ref 15–41)
Anion gap: 7 (ref 5–15)
BILIRUBIN TOTAL: 1 mg/dL (ref 0.3–1.2)
BUN: 14 mg/dL (ref 6–20)
CALCIUM: 9 mg/dL (ref 8.9–10.3)
CO2: 27 mmol/L (ref 22–32)
Chloride: 105 mmol/L (ref 101–111)
Creatinine, Ser: 1.39 mg/dL — ABNORMAL HIGH (ref 0.61–1.24)
GFR calc Af Amer: 53 mL/min — ABNORMAL LOW (ref >60)
GFR calc non Af Amer: 46 mL/min — ABNORMAL LOW (ref >60)
GLUCOSE: 112 mg/dL — AB (ref 65–99)
Potassium: 4.5 mmol/L (ref 3.5–5.1)
Sodium: 139 mmol/L (ref 135–145)
Total Protein: 6.5 g/dL (ref 6.5–8.1)

## 2016-08-24 LAB — LACTATE DEHYDROGENASE: LDH: 132 U/L (ref 98–192)

## 2016-08-24 LAB — D-DIMER, QUANTITATIVE: D-Dimer, Quant: 0.52 ug/mL-FEU — ABNORMAL HIGH (ref 0.00–0.50)

## 2016-08-24 NOTE — Patient Instructions (Signed)
To lab today We will call with results Return visit in 6 months

## 2016-08-24 NOTE — Progress Notes (Signed)
New Patient Hematology   Roberto Reid [id number] [birth year] 83 y.o. 08/24/2016  CC: Dr. 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  He contracted a respiratory illness back in November 2017 with persistent symptoms for the next 3 months requiring multiple courses of antibiotics and steroids.  Initial chest radiograph on April 15, 2016, which I personally reviewed, is read as "probable subsegmental atelectasis versus early pneumonia in the left lower lobe.  No definite pneumonia".  Multiple subsequent chest x-rays on December 15, January 2, and January 15, and a CT scan of the chest without contrast on June 15, 2016 which I also reviewed, showed no obvious pathology.  "Minimal patchy groundglass density over the diaphragms bilaterally is likely atelectasis".  Negative for pneumonia or other acute finding.  No pulmonary nodules or adenopathy. He developed acute pain in his right calf in mid January.  Vascular ultrasound of the lower extremities on January 15 showed extensive proximal DVT from the tibial veins through the common femoral veins on the right.  Ventilation perfusion lung scan done on January 16 and read as "worrisome for PE in the right middle lobe and lingula".  A d-dimer was significantly elevated at 6.1 units. He was started on anticoagulation with Eliquis. He developed significant constitutional symptoms over the interval when he had a persistent influenza-like illness with decreased appetite and a 22 pound weight loss.  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 He has chronic problems with his right lower extremity subsequent to a brown recluse spider bite which happened in 2007  requiring hospital admission for 3 days.  Leg has never been the same since.  He had a tendon injury requiring quadriceps tendon repair with right knee arthroscopy in May 2016.  He has had chronic problems with his right foot with pain and a nonhealing wound.  He is status post a left total knee replacement in July 2013. He was recently evaluated for cyanotic changes of the left index finger on June 03, 2016 with suspicion for an arterial embolus.  An arteriogram was scheduled but has not been done.  He is up-to-date on his health maintenance exams.  He does not remember exactly when his last colonoscopy was but prior exams were unremarkable.  He has chronic constipation due to low dose narcotic analgesics he takes for chronic right foot pain. He has no history of lupus or inflammatory arthritis.He had asthma as a child which resolved as an adult.  He used to smoke but stopped over 30 years ago.  Currently no dyspnea chest pain or palpitations at rest.  He takes Nexium on a chronic basis for GERD.  No prior ulcers. No history of prostate problems.  PSA has been checked and reported to him as normal in the recent past.  Of note is a CBC done on January 17 which showed an acute fall in his hemoglobin and platelets from baseline 14 and 181,000 respectively to 11 and 135,000.  I will repeat this today.  There is no family history of blood clots.  Mother lived to age 16.  Father died of complications of diabetes at age 56.  He had 2 sisters both deceased.  One with an unusual infection.  He has 2 children  in their 44s son and a daughter who are healthy.    PMH: Past Medical History:  Diagnosis Date  . Arthritis    "right hand" (06/15/2016)  . Childhood asthma   . GERD (gastroesophageal reflux disease)   . Hearing loss   . History of kidney stones   . Hyperlipidemia   . Hypertension   . Pneumonia    "I've had it ~ 9 times as an adult; last time was 04/2016" (06/15/2016)  No history of hepatitis,  yellow jaundice, malaria, tuberculosis, seizure, stroke, thyroid disease.  Recent steroid related diabetes treated with metformin.  Steroids given for acute respiratory illness now stopped.  Past Surgical History:  Procedure Laterality Date  . CATARACT EXTRACTION W/ INTRAOCULAR LENS  IMPLANT, BILATERAL Bilateral 04/2009  . ELBOW BURSA SURGERY Right   . FOOT SURGERY Right 2010   "attempted reconstruction" per pt  . JOINT REPLACEMENT    . KIDNEY STONE SURGERY    . KNEE ARTHROSCOPY Right 10/08/2014   Procedure: ARTHROSCOPY KNEE;  Surgeon: Vickey Huger, MD;  Location: [facility];  Service: Orthopedics;  Laterality: Right;  . KNEE ARTHROSCOPY Left    repair torn knee cartilage  . QUADRICEPS TENDON REPAIR Right 10/08/2014   Procedure: REPAIR QUADRICEP TENDON;  Surgeon: Vickey Huger, MD;  Location: [facility];  Service: Orthopedics;  Laterality: Right;  . TONSILLECTOMY    . TOTAL KNEE ARTHROPLASTY  12/14/2011   Procedure: TOTAL KNEE ARTHROPLASTY;  Surgeon: Rudean Haskell, MD;  Location: [facility];  Service: Orthopedics;  Laterality: Left;    Allergies: Allergies  Allergen Reactions  . Oxycodone-Acetaminophen Other (See Comments)    confusion  . Percocet [Oxycodone-Acetaminophen]     Wild hallucinations. He states he has tolerated plain oxycodone in the past.   . Sulfa Antibiotics   . Sulfamethoxazole Rash    Medications:  Current Outpatient Prescriptions:  .  albuterol (PROVENTIL HFA;VENTOLIN HFA) 108 (90 Base) MCG/ACT inhaler, Inhale 2 puffs into the lungs every 6 (six) hours as needed for wheezing or shortness of breath., Disp: 1 Inhaler, Rfl: 2 .  apixaban (ELIQUIS) 5 MG TABS tablet, Take 1 tablet (5 mg total) by mouth 2 (two) times daily. 10 mg BID x 7 days followed by 5 mg PO BID, Disp: 90 tablet, Rfl: 0 .  atorvastatin (LIPITOR) 80 MG tablet, Take 1 tablet (80 mg total) by mouth daily at 6 PM., Disp: 90 tablet, Rfl: 1 .  chlorpheniramine-HYDROcodone (TUSSIONEX PENNKINETIC ER) 10-8 MG/5ML SUER, Take  5 mLs by mouth every 12 (twelve) hours as needed for cough., Disp: 140 mL, Rfl: 0 .  Coenzyme Q10 (CO Q-10 PO), Take 1 tablet by mouth daily. 300 mg, Disp: , Rfl:  .  esomeprazole (NEXIUM) 40 MG capsule, Take 1 capsule (40 mg total) by mouth 2 (two) times daily., Disp: 60 capsule, Rfl: 11 .  HYDROcodone-acetaminophen (NORCO) 10-325 MG tablet, Take 1 tablet by mouth every 8 (eight) hours., Disp: 90 tablet, Rfl: 0 .  loratadine (CLARITIN) 10 MG tablet, Take 10 mg by mouth daily as needed for allergies., Disp: , Rfl:  .  losartan (COZAAR) 100 MG tablet, Take 1 tablet (100 mg total) by mouth daily., Disp: 90 tablet, Rfl: 1 .  metFORMIN (GLUCOPHAGE) 500 MG tablet, Take 1 tablet (500 mg total) by mouth 2 (two) times daily with a meal., Disp: 90 tablet, Rfl: 1 .  Multiple Vitamins-Minerals (MULTIVITAMIN WITH MINERALS) tablet, Take 1 tablet by mouth daily., Disp: , Rfl:  .  Omega-3 Fatty Acids (  FISH OIL PO), Take by mouth., Disp: , Rfl:  .  ondansetron (ZOFRAN) 4 MG tablet, Take 1 tablet (4 mg total) by mouth every 8 (eight) hours as needed for nausea or vomiting., Disp: 20 tablet, Rfl: 0 .  QUEtiapine (SEROQUEL) 50 MG tablet, Take 1 tablet (50 mg total) by mouth at bedtime., Disp: 30 tablet, Rfl: 5 .  sertraline (ZOLOFT) 50 MG tablet, Take 1 tablet (50 mg total) by mouth daily., Disp: 30 tablet, Rfl: 3 .  Testosterone (ANDROGEL) 20.25 MG/1.25GM (1.62%) GEL, Apply six pumps once a day., Disp: 1.25 g, Rfl: 5 .  zolpidem (AMBIEN) 10 MG tablet, Take 1 tablet (10 mg total) by mouth at bedtime., Disp: 30 tablet, Rfl: 5  Social History: Married.  2 healthy children in their 65s a son and a daughter.  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  He is a former Administrator, arts for [employer],  He quit smoking about 30 years ago.   He drinks about 4.2 oz of alcohol per week; he does not use drugs.  Family History: Family History  Problem Relation Age of Onset  . Parkinson's disease Father     Review of  Systems: See HPI  Physical Exam: Blood pressure (!) 147/63, pulse 95, temperature 97.8 F (36.6 C), temperature source Oral, height 5\' 10"  (1.778 m), weight 200 lb 4.8 oz (90.9 kg), SpO2 96 %. Wt Readings from Last 3 Encounters:  08/24/16 200 lb 4.8 oz (90.9 kg)  07/23/16 194 lb 8 oz (88.2 kg)  06/26/16 190 lb (86.2 kg)     General appearance: Well-nourished Caucasian man HENNT: Pharynx no erythema, exudate, mass, or ulcer. No thyromegaly or thyroid nodules Lymph nodes: No cervical, supraclavicular, or axillary lymphadenopathy Breasts:  Lungs: Clear to auscultation, resonant to percussion throughout Heart: Irregularly irregular rhythm, no murmur, no gallop, no rub, no click, no edema Abdomen: Soft, nontender, normal bowel sounds, no mass, no organomegaly Extremities: No edema, no calf tenderness Musculoskeletal: no joint deformities Left calf 37 cm, right 41 cm Left ankle 23.5 cm, right 27 cm GU:  Vascular: Carotid pulses 2+, no bruits,  Neurologic: Alert, oriented, PERRLA, optic discs sharp and vessels normal, no hemorrhage or exudate, cranial nerves grossly normal, motor strength 5 over 5, reflexes 1+ symmetric, upper body coordination normal, gait normal, Skin: No rash or ecchymosis    Lab Results: Lab Results  Component Value Date   WBC 7.4 08/24/2016   HGB 13.8 08/24/2016   HCT 41.3 08/24/2016   MCV 96.0 08/24/2016   PLT 249 08/24/2016     Chemistry      Component Value Date/Time   NA 134 (L) 06/17/2016 0348   K 4.3 06/17/2016 0348   CL 102 06/17/2016 0348   CO2 25 06/17/2016 0348   BUN 24 (H) 06/17/2016 0348   CREATININE 1.38 (H) 06/17/2016 0348   CREATININE 1.43 (H) 02/18/2016 1011      Component Value Date/Time   CALCIUM 7.8 (L) 06/17/2016 0348   ALKPHOS 30 (L) 02/18/2016 1011   AST 11 02/18/2016 1011   ALT 12 02/18/2016 1011   BILITOT 0.8 02/18/2016 1011    Repeat CBC today has returned to normal.   Review of peripheral blood film:  Pending   Radiological Studies: See discussion above    Impression: Acute proximal right lower extremity DVT and right lower lobe PE  Overall my clinical impression is that this was a provoked event in someone with chronic right lower extremity problems  who then developed a prolonged inflammatory process, likely viral, in his lungs during an interval when the influenza A virus was strongly present in our community.   Recommendation: I am recommending 6 months of full dose anticoagulation and then reevaluation for either discontinuation of anticoagulation at that time or dose reduction.  He fits the characteristics of patients enrolled on to recent clinical trials.  The first reported in 2013 in the [place] [employer] and the other in March 2017.  Both trials evaluated patients at intermediate or unclear risk for recurrent thrombotic events after 6-12 months of full dose anticoagulation.  The first study randomized patients to full dose Eliquis, Eliquis at a 50% dose reduction, or placebo.  The second study randomized patients to full dose Xarelto, 50% dose reduction, or aspirin 100 mg. Patients were followed for an additional year.  In both studies, 50% dose reduction resulted in equivalent protection against recurrent thrombotic events as continuing full dose.  Both the placebo group in the first trial and the aspirin group in the second trial had an unacceptable recurrence of thrombotic events.  Many of the patients in both studies had unprovoked events.  I plan to see the patient again in 6 months to reevaluate and make further recommendations at that time.    Murriel Hopper, MD, [place]  Hematology-Oncology/Internal Medicine  08/24/2016, 3:46 PM

## 2016-08-25 ENCOUNTER — Telehealth: Payer: Self-pay | Admitting: *Deleted

## 2016-08-25 NOTE — Telephone Encounter (Signed)
Pt / informed "CBC back to normal; age adjusted d-dimer now normal. Copy forwarded to Dr Renold Genta" per Dr Beryle Beams.  "Thank you very much "

## 2016-08-25 NOTE — Telephone Encounter (Signed)
-----   Message from Annia Belt, MD sent at 08/24/2016  4:22 PM EDT ----- Call pt: CBC back to normal; age adjusted d-dimer now normal. Copy forwarded to Dr Renold Genta

## 2016-08-31 DIAGNOSIS — N5201 Erectile dysfunction due to arterial insufficiency: Secondary | ICD-10-CM | POA: Diagnosis not present

## 2016-08-31 DIAGNOSIS — E291 Testicular hypofunction: Secondary | ICD-10-CM | POA: Diagnosis not present

## 2016-08-31 DIAGNOSIS — Z87442 Personal history of urinary calculi: Secondary | ICD-10-CM | POA: Diagnosis not present

## 2016-09-04 ENCOUNTER — Telehealth: Payer: Self-pay | Admitting: Internal Medicine

## 2016-09-04 MED ORDER — LEVOFLOXACIN 500 MG PO TABS: 500.0000 mg | ORAL_TABLET | Freq: Every day | ORAL | 0 refills | Status: DC

## 2016-09-04 NOTE — Telephone Encounter (Signed)
Escribed

## 2016-09-04 NOTE — Telephone Encounter (Signed)
Call in Levaquin 500 mg daily x 10 days

## 2016-09-04 NOTE — Telephone Encounter (Signed)
Patient is calling to ask if you would call in an antibiotic for him to keep on hand.  States that he normally has one for standby for his foot in the event that he needs one.  He's going to be traveling next week and would like to make sure that he has it in the event that he needs it.  States that he went through what he had back in December when he was so sick.    States that he usually has Augmentin on hand.    Pharmacy:  [facility]  Please call patient when this has been done.  Best # for contact:  [phone]

## 2016-09-07 ENCOUNTER — Other Ambulatory Visit: Payer: Self-pay

## 2016-09-07 MED ORDER — QUETIAPINE FUMARATE 50 MG PO TABS: 50.0000 mg | ORAL_TABLET | Freq: Every day | ORAL | 5 refills | Status: DC

## 2016-09-17 ENCOUNTER — Other Ambulatory Visit: Payer: Self-pay

## 2016-09-17 NOTE — Telephone Encounter (Signed)
Patient called to request a refill on [facility], he said he would like to pick it tomorrow afternoon.   Last refill 08/20/16 #90.  Last OV 08/20/16.

## 2016-09-18 MED ORDER — HYDROCODONE-ACETAMINOPHEN 10-325 MG PO TABS: 1.0000 | ORAL_TABLET | Freq: Three times a day (TID) | ORAL | 0 refills | Status: DC

## 2016-09-18 NOTE — Telephone Encounter (Signed)
Refill a 30 days Must have appt for pain management in 30 days

## 2016-10-06 ENCOUNTER — Telehealth: Payer: Self-pay | Admitting: Internal Medicine

## 2016-10-06 ENCOUNTER — Encounter: Payer: Self-pay | Admitting: Internal Medicine

## 2016-10-06 MED ORDER — APIXABAN 5 MG PO TABS: 5.0000 mg | ORAL_TABLET | Freq: Two times a day (BID) | ORAL | 2 refills | Status: DC

## 2016-10-06 NOTE — Telephone Encounter (Addendum)
Patient needs a Rx for Eloquist 5mg .  He takes 1 twice daily.  When he was in the hospital and this was prescribed, it was given to him as a weekly prescription.  He needs to get a monthly Rx for this please.  He is coming to see you for pain management on 5/18 at noon.    Pharmacy:  [facility]  Refill x 3 months

## 2016-10-07 ENCOUNTER — Encounter: Payer: Self-pay | Admitting: Internal Medicine

## 2016-10-07 ENCOUNTER — Telehealth: Payer: Self-pay | Admitting: Internal Medicine

## 2016-10-07 DIAGNOSIS — G4701 Insomnia due to medical condition: Secondary | ICD-10-CM

## 2016-10-07 NOTE — Telephone Encounter (Signed)
Prior [place] questionnaire completed for Seroquel requested by [employer]. Attempted to fax back from where faxed originated. There is no number on the form that tells me where to fax the form. I call [facility] and they gave me a prior authorization phone line. We will call that phone line to see where we should fax the form. [facility] tells me that patient paid out of pocket for a month until we can get prior authorization approved.

## 2016-10-16 ENCOUNTER — Ambulatory Visit (INDEPENDENT_AMBULATORY_CARE_PROVIDER_SITE_OTHER): Payer: Medicare Other | Admitting: Internal Medicine

## 2016-10-16 ENCOUNTER — Encounter: Payer: Self-pay | Admitting: Internal Medicine

## 2016-10-16 VITALS — BP 120/80 | HR 71 | Temp 97.9°F | Ht 70.0 in | Wt 195.0 lb

## 2016-10-16 DIAGNOSIS — R7302 Impaired glucose tolerance (oral): Secondary | ICD-10-CM

## 2016-10-16 DIAGNOSIS — E7849 Other hyperlipidemia: Secondary | ICD-10-CM

## 2016-10-16 DIAGNOSIS — M79671 Pain in right foot: Secondary | ICD-10-CM | POA: Diagnosis not present

## 2016-10-16 DIAGNOSIS — I10 Essential (primary) hypertension: Secondary | ICD-10-CM | POA: Diagnosis not present

## 2016-10-16 DIAGNOSIS — G8929 Other chronic pain: Secondary | ICD-10-CM

## 2016-10-16 DIAGNOSIS — E784 Other hyperlipidemia: Secondary | ICD-10-CM

## 2016-10-16 DIAGNOSIS — Z86711 Personal history of pulmonary embolism: Secondary | ICD-10-CM

## 2016-10-16 MED ORDER — LEVOFLOXACIN 500 MG PO TABS: 500.0000 mg | ORAL_TABLET | Freq: Every day | ORAL | 0 refills | Status: DC

## 2016-10-16 MED ORDER — HYDROCODONE-ACETAMINOPHEN 10-325 MG PO TABS: 1.0000 | ORAL_TABLET | Freq: Four times a day (QID) | ORAL | 0 refills | Status: DC

## 2016-10-16 NOTE — Progress Notes (Signed)
   Subjective:    Patient ID: Roberto Reid, male    DOB: [birth year], 83 y.o.   MRN: [id number]  HPI 83 year old  Male for pain management Evaluation. This is his 90 day recheck. He is on Eliquis for history of DVT. He saw Dr. Beryle Beams, Hematologist, recently had indicated he would keep him on it at least 6 months.  Patient is asking if he can take Aleve with [facility]. All think that is advisable. Says Aleve helps him with pain after he plays golf.  Told him I would rather him take additional pain medication rather than add Aleve to his current regimen. It could certainly cause bleeding issues in the face of taking Eliquis.  He has no plans for travel in the immediate future but he wants to have some Levaquin on hand should he need it. This was prescribed today 500 mg once a day for 10 days.  [employer] will not pay for  Seroquel so he will need to pay out of pocket. We tried getting this approved and they denied it. It works well for him with regard to insomnia. He says it's running about $20 a month. He is back on AndroGel and he says he feels better with more energy.  Has long-standing issues with right foot causing chronic pain. He did see Dr. Para March for this in June 2017. 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 He had attempted arthrodesis by Dr. Beola Cord in [place] and 2010. 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 He tried orthotics but these were uncomfortable. He tries to play golf a couple times a week. He is exercising regularly. Main source of pain is fourth mallet toe. 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 There was loss of medial longitudinal arch with plantar gapping at the first TMT. He had evidence of previous osteotomy of the second toe. He underwent tenotomy which at first seemed  to help but no longer helps. The pain returned several months later.    Review of Systems see above      Objective:   Physical Exam Spent 25 minutes speaking with patient about his medical issues. Initially blood pressure was elevated when he came into the office but upon repeat check it was normal. No changes in foot situation.        Assessment & Plan:   Chronic pain management due to right foot pain long-standing- see above information  Plan: Increase hydrocodone/APAP 5/325 up to 4 times daily #120 with no refill. He does not want to be on oxycodone. Do not take Aleve or Advil as he is on Eliquis.  Plan: He will need to return in 90 days for reevaluation of pain management. Levaquin 500 mg daily for 10 days prescribed so he may have that on hand for travel if needed for recurrent respiratory infections. He also has a history of recurrent cellulitis of his leg.

## 2016-10-16 NOTE — Patient Instructions (Signed)
Increase hydrocodone/APAP 5/325 up to 4 times daily from 3 times daily. Return in 90 days. Levaquin refilled for 10 days to take along if traveling.

## 2016-11-19 ENCOUNTER — Telehealth: Payer: Self-pay

## 2016-11-19 MED ORDER — HYDROCODONE-ACETAMINOPHEN 10-325 MG PO TABS: 1.0000 | ORAL_TABLET | Freq: Four times a day (QID) | ORAL | 0 refills | Status: DC

## 2016-11-19 NOTE — Telephone Encounter (Signed)
Pt called requesting refill on hydrocodone, is aware that it may not be ready until Monday. Please advise on refill.

## 2016-11-19 NOTE — Telephone Encounter (Signed)
Per Dr. Renold Genta prescription was printed for pt to pick up on 11/23/16. Pt is aware

## 2016-12-14 ENCOUNTER — Telehealth: Payer: Self-pay

## 2016-12-14 MED ORDER — APIXABAN 5 MG PO TABS: 5.0000 mg | ORAL_TABLET | Freq: Two times a day (BID) | ORAL | 2 refills | Status: DC

## 2016-12-14 NOTE — Telephone Encounter (Signed)
Received fax from [facility] in regards to a refill on Eliquis for patient. Medication was refilled per Dr. Verlene Mayer request. Sent 90 days

## 2016-12-25 ENCOUNTER — Telehealth: Payer: Self-pay

## 2016-12-25 MED ORDER — HYDROCODONE-ACETAMINOPHEN 10-325 MG PO TABS: 1.0000 | ORAL_TABLET | Freq: Four times a day (QID) | ORAL | 0 refills | Status: DC

## 2016-12-25 NOTE — Telephone Encounter (Signed)
Pt called requesting a refill on his Hydrocodone this morning, infromed him that Dr. Renold Genta is seeing patients and it is unlikely it will be ready for pickup this morning. Pt stated he was going out of town and needed it this morning and I again informed him that Dr. Renold Genta is seeing patients and I will call him when she is able to advise me on this refill.

## 2016-12-25 NOTE — Telephone Encounter (Signed)
Pt is aware and appt made

## 2016-12-25 NOTE — Telephone Encounter (Signed)
Refill once. I think he is due for 3 month pain follow up in August. Please check. Can pick up before 1 pm

## 2017-01-04 ENCOUNTER — Encounter: Payer: Self-pay | Admitting: Internal Medicine

## 2017-01-04 ENCOUNTER — Ambulatory Visit (INDEPENDENT_AMBULATORY_CARE_PROVIDER_SITE_OTHER): Payer: Medicare Other | Admitting: Internal Medicine

## 2017-01-04 VITALS — BP 108/56 | HR 69 | Temp 97.8°F | Wt 187.0 lb

## 2017-01-04 DIAGNOSIS — E119 Type 2 diabetes mellitus without complications: Secondary | ICD-10-CM | POA: Diagnosis not present

## 2017-01-04 DIAGNOSIS — M79671 Pain in right foot: Secondary | ICD-10-CM

## 2017-01-04 DIAGNOSIS — I10 Essential (primary) hypertension: Secondary | ICD-10-CM | POA: Diagnosis not present

## 2017-01-04 DIAGNOSIS — R7989 Other specified abnormal findings of blood chemistry: Secondary | ICD-10-CM | POA: Diagnosis not present

## 2017-01-04 DIAGNOSIS — E8881 Metabolic syndrome: Secondary | ICD-10-CM

## 2017-01-04 DIAGNOSIS — G8929 Other chronic pain: Secondary | ICD-10-CM

## 2017-01-04 DIAGNOSIS — F5104 Psychophysiologic insomnia: Secondary | ICD-10-CM

## 2017-01-04 DIAGNOSIS — Z86711 Personal history of pulmonary embolism: Secondary | ICD-10-CM

## 2017-01-04 DIAGNOSIS — Z79890 Hormone replacement therapy (postmenopausal): Secondary | ICD-10-CM

## 2017-01-04 DIAGNOSIS — R7302 Impaired glucose tolerance (oral): Secondary | ICD-10-CM

## 2017-01-04 DIAGNOSIS — N183 Chronic kidney disease, stage 3 unspecified: Secondary | ICD-10-CM

## 2017-01-04 MED ORDER — LOSARTAN POTASSIUM 100 MG PO TABS: 100.0000 mg | ORAL_TABLET | Freq: Every day | ORAL | 3 refills | Status: DC

## 2017-01-04 NOTE — Progress Notes (Signed)
   Subjective:    Patient ID: Roberto Reid, male    DOB: [birth year], 83 y.o.   MRN: [id number]  HPI 83 year old Male with history of chronic foot pain due to foot deformity. He is on chronic pain medications for that issue. Particular bothersome when he is trying to play golf which she's been doing more recently. He takes his medications as prescribed and feels that he is getting pretty good pain relief. Doesn't want  any change in medications at the present time.  He has a history of testosterone replacement and has been seen by urologist. AndroGel has become quite expensive on his insurance plan. He is asking about Depakote testosterone injection and I'll need to look into that for him. We may be able to offer that here.  He says some mild dependent edema with hot weather. It is not bothersome to him.    Review of Systems See above    Objective:   Physical Exam  Neck supple without JVD thyromegaly or carotid bruits. Chest clear. Cardiac exam regular rate and rhythm. Extremities trace lower extremity edema that is nonpitting. No change in foot deformity.      Assessment & Plan:  Chronic pain management-continue same medications  Chronic foot deformity-has had surgery at [facility] without much success in pain control  Dependent edema-continue to observe  Insomnia-treated with Seroquel and Ambien  Glucose tolerance-hemoglobin A1c 5.3%  Chronic kidney disease-creatinine 1.58 and previously was 1.39. Continue to monitor.  History of pulmonary embolus-on chronic anticoagulation  Plan: Patient will be seen in 3 months for follow-up of chronic pain issues. Medication will continue to be refilled as previously prescribed(Norco 10/325 4 times daily)

## 2017-01-05 LAB — HEMOGLOBIN A1C
HEMOGLOBIN A1C: 5.3 % (ref <5.7)
MEAN PLASMA GLUCOSE: 105 mg/dL

## 2017-01-05 LAB — BASIC METABOLIC PANEL
BUN: 26 mg/dL — ABNORMAL HIGH (ref 7–25)
CALCIUM: 9.2 mg/dL (ref 8.6–10.3)
CO2: 21 mmol/L (ref 20–32)
CREATININE: 1.58 mg/dL — AB (ref 0.70–1.11)
Chloride: 104 mmol/L (ref 98–110)
Glucose, Bld: 104 mg/dL — ABNORMAL HIGH (ref 65–99)
Potassium: 4.8 mmol/L (ref 3.5–5.3)
SODIUM: 141 mmol/L (ref 135–146)

## 2017-01-17 NOTE — Patient Instructions (Addendum)
Continue hydrocodone/APAP 4 times daily for chronic pain management. Continue to watch diet and exercise. Follow-up in 3 months.

## 2017-01-19 ENCOUNTER — Ambulatory Visit (INDEPENDENT_AMBULATORY_CARE_PROVIDER_SITE_OTHER): Payer: Medicare Other | Admitting: Internal Medicine

## 2017-01-19 VITALS — BP 130/60 | HR 83 | Temp 97.9°F | Wt 187.0 lb

## 2017-01-19 DIAGNOSIS — R1012 Left upper quadrant pain: Secondary | ICD-10-CM | POA: Diagnosis not present

## 2017-01-19 DIAGNOSIS — R7989 Other specified abnormal findings of blood chemistry: Secondary | ICD-10-CM

## 2017-01-19 MED ORDER — TESTOSTERONE CYPIONATE 200 MG/ML IM SOLN: 100.0000 mg | Freq: Once | INTRAMUSCULAR | Status: DC

## 2017-01-19 MED ORDER — TESTOSTERONE CYPIONATE 200 MG/ML IM SOLN
300.0000 mg | Freq: Once | INTRAMUSCULAR | Status: AC
  Administered 2017-01-19: 300 mg via INTRAMUSCULAR

## 2017-01-19 MED ORDER — TESTOSTERONE CYPIONATE 200 MG/ML IM SOLN: 300.0000 mg | Freq: Once | INTRAMUSCULAR | Status: DC

## 2017-01-19 NOTE — Progress Notes (Signed)
   Subjective:    Patient ID: Roberto Reid, male    DOB: September 15, 1932, 83 y.o.   MRN: [id number]  HPI 83 year old male with history of low serum testosterone treated with AndroGel. This is become quite expensive for him. He ask about an alternative. We have ordered Depotestosterone   for him. Serum testosterone drawn today.  He's been having some left upper quadrant abdominal pain recently. Playing a lot of golf and may have strained himself.    Review of Systems see above     Objective:   Physical Exam Abdominal exam reveals no distention. Bowel sounds are slightly increased. No splenomegaly and no significant tenderness.       Assessment & Plan:  History of low testosterone  Left upper quadrant abdominal pain-likely musculoskeletal  Plan: 3 mL Depotestosterone given IM today 300 mg return in 4 weeks.

## 2017-01-19 NOTE — Addendum Note (Signed)
Addended by: Mady Haagensen on: 01/19/2017 04:11 PM   Modules accepted: Orders

## 2017-01-19 NOTE — Patient Instructions (Addendum)
Depotestosterone IM given 300 mg. Return in 4 weeks for another injection. Serum testosterone level pending. I think left upper quadrant pain is musculoskeletal. See if it will resolve.

## 2017-01-20 LAB — TESTOSTERONE: TESTOSTERONE: 590 ng/dL (ref 250–827)

## 2017-01-26 ENCOUNTER — Telehealth: Payer: Self-pay

## 2017-01-26 ENCOUNTER — Ambulatory Visit: Payer: Medicare Other | Admitting: Internal Medicine

## 2017-01-26 MED ORDER — HYDROCODONE-ACETAMINOPHEN 10-325 MG PO TABS: 1.0000 | ORAL_TABLET | Freq: Four times a day (QID) | ORAL | 0 refills | Status: DC

## 2017-01-26 NOTE — Telephone Encounter (Signed)
Refill once 

## 2017-01-26 NOTE — Telephone Encounter (Signed)
Pt called requesting refill on Hydrocodone, last filled 12/25/16. Please advise

## 2017-01-26 NOTE — Telephone Encounter (Signed)
Done

## 2017-01-27 ENCOUNTER — Telehealth: Payer: Self-pay

## 2017-01-27 MED ORDER — ZOLPIDEM TARTRATE 10 MG PO TABS: 10.0000 mg | ORAL_TABLET | Freq: Every day | ORAL | 1 refills | Status: DC

## 2017-01-27 NOTE — Telephone Encounter (Signed)
Received fax from [facility] in regards to a refill on Ambien 10mg  for patient. Medication was refilled per Dr. Verlene Mayer request. Sent 6 month supply

## 2017-02-16 IMAGING — CR DG CHEST 2V
2 series · 2 of 2 positions shown · non-contrast
Comparison: 04/15/2016

CLINICAL DATA: Current pneumonias with fever

EXAM:
CHEST  2 VIEW

[w chest pa]
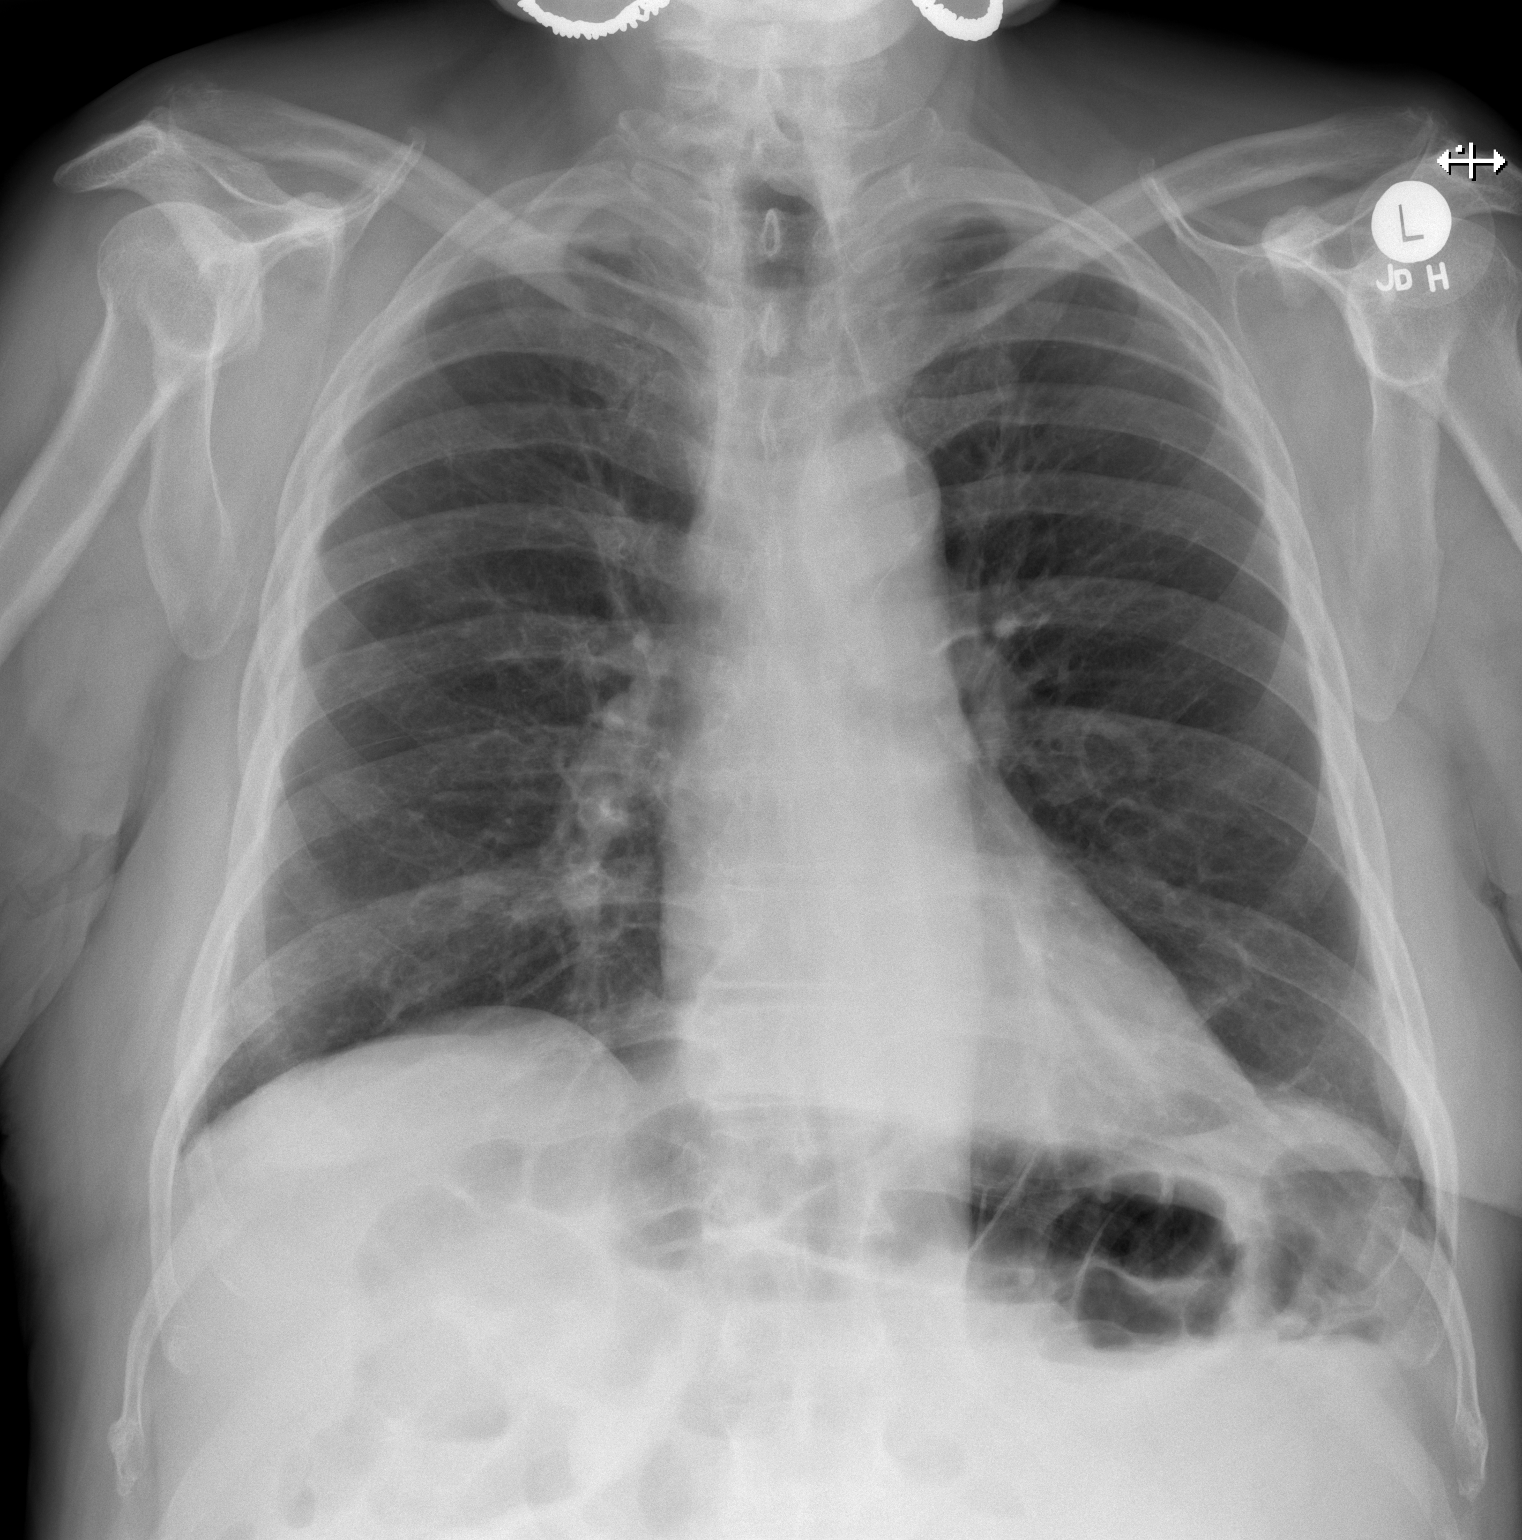

[w chest lat]
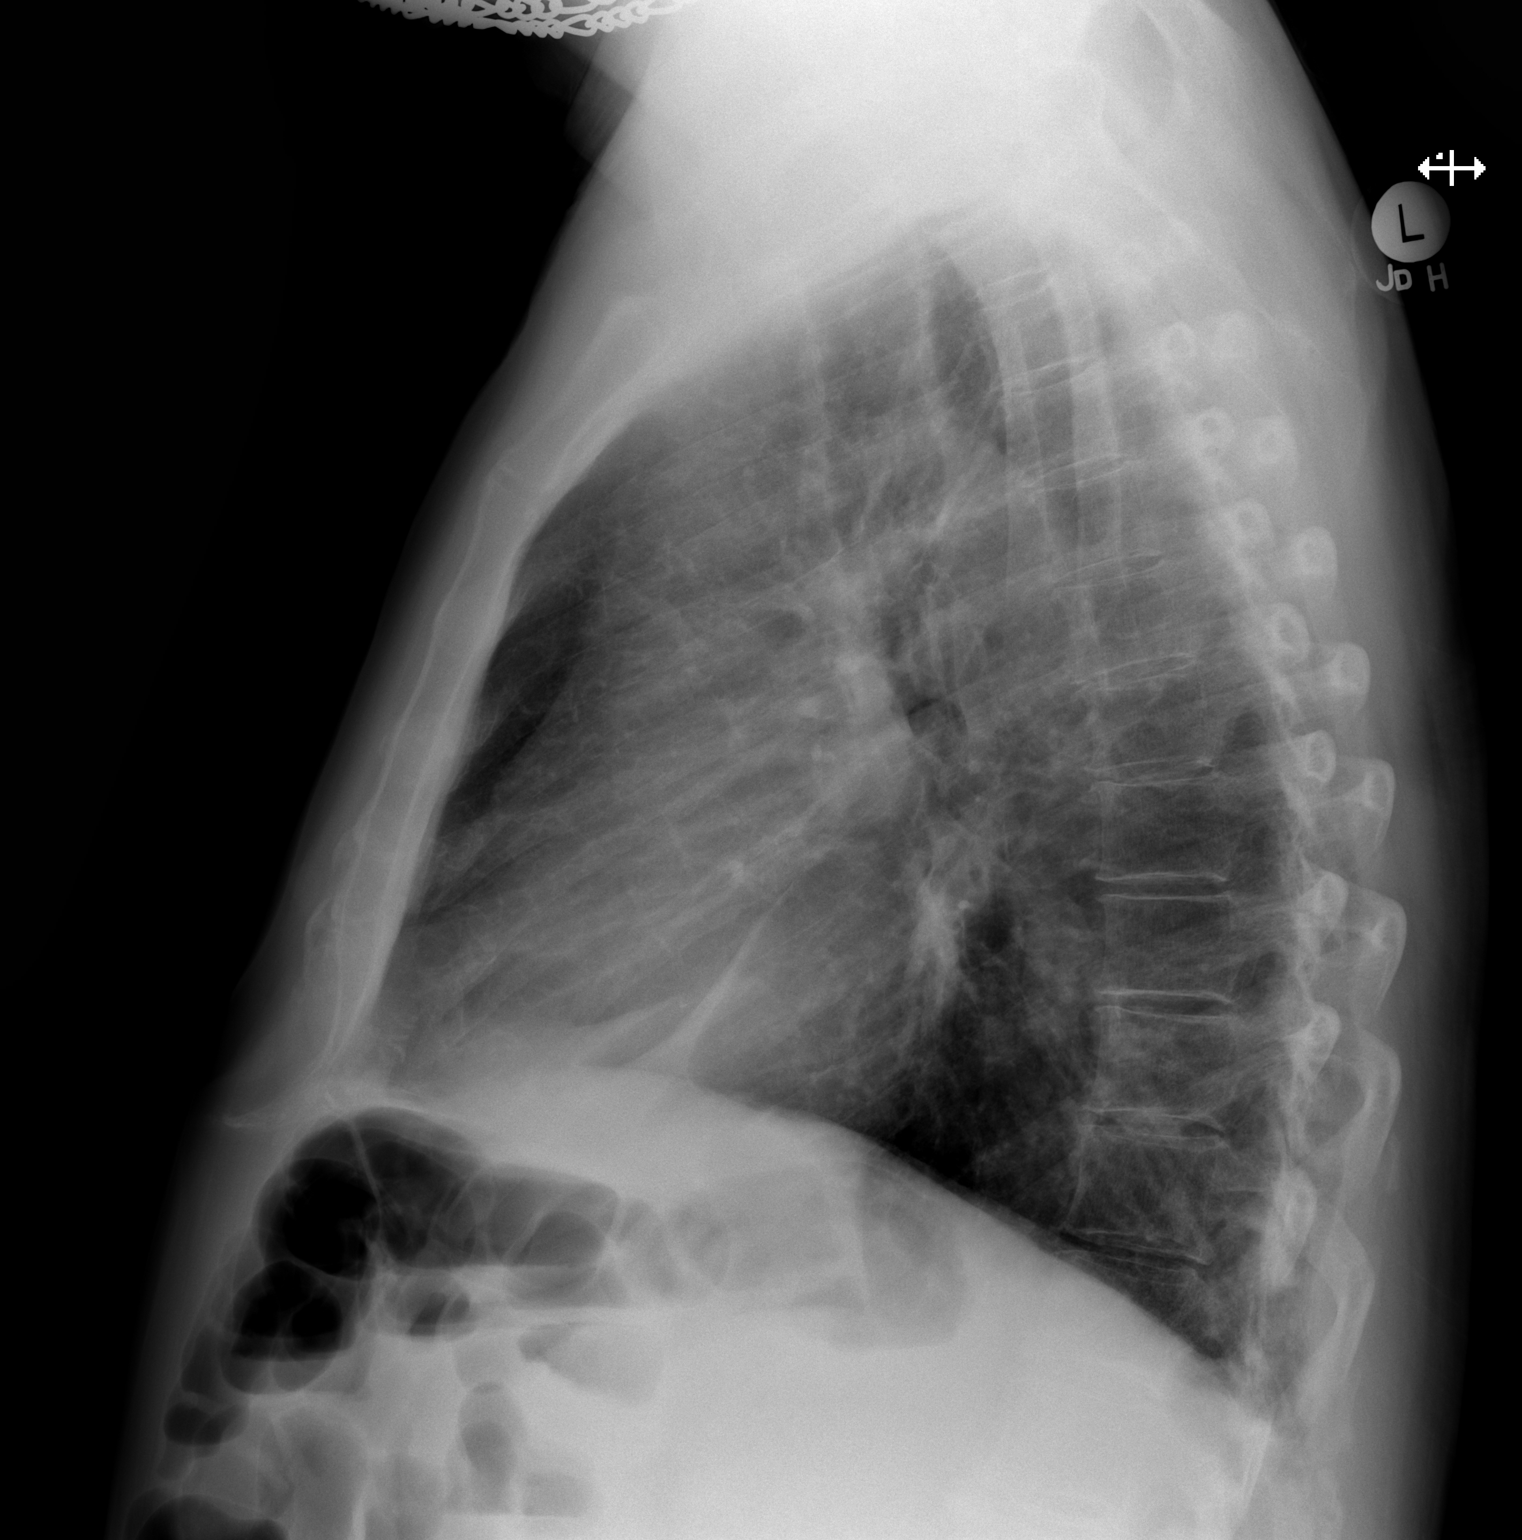

[2 of 2 positions shown; findings below may reference images not displayed]

FINDINGS: Cardiac shadow is within normal limits. The lungs are well aerated
bilaterally. Improved aeration is seen in the left lung base when
compare with the prior exam. No new focal abnormality is seen. No
bony abnormality is noted.
IMPRESSION: No acute abnormality seen.

## 2017-02-19 ENCOUNTER — Ambulatory Visit (INDEPENDENT_AMBULATORY_CARE_PROVIDER_SITE_OTHER): Payer: Medicare Other | Admitting: Internal Medicine

## 2017-02-19 VITALS — BP 115/80 | Temp 98.7°F

## 2017-02-19 DIAGNOSIS — I10 Essential (primary) hypertension: Secondary | ICD-10-CM | POA: Diagnosis not present

## 2017-02-19 DIAGNOSIS — R7989 Other specified abnormal findings of blood chemistry: Secondary | ICD-10-CM

## 2017-02-19 DIAGNOSIS — Z23 Encounter for immunization: Secondary | ICD-10-CM

## 2017-02-19 MED ORDER — TESTOSTERONE CYPIONATE 200 MG/ML IM SOLN
300.0000 mg | Freq: Once | INTRAMUSCULAR | Status: AC
  Administered 2017-02-19: 300 mg via INTRAMUSCULAR

## 2017-02-19 NOTE — Patient Instructions (Signed)
Testosterone and flu shot given return in one month

## 2017-02-19 NOTE — Progress Notes (Signed)
Flu shot and testosterone shot given, pt tolerated well. Return in one month

## 2017-02-20 NOTE — Addendum Note (Signed)
Addended by: Elby Showers on: 02/20/2017 04:15 PM   Modules accepted: Level of Service

## 2017-02-22 ENCOUNTER — Ambulatory Visit (INDEPENDENT_AMBULATORY_CARE_PROVIDER_SITE_OTHER): Payer: Medicare Other | Admitting: Oncology

## 2017-02-22 ENCOUNTER — Encounter: Payer: Self-pay | Admitting: Oncology

## 2017-02-22 VITALS — BP 157/58 | HR 92 | Temp 97.6°F | Ht 70.0 in | Wt 188.5 lb

## 2017-02-22 DIAGNOSIS — I129 Hypertensive chronic kidney disease with stage 1 through stage 4 chronic kidney disease, or unspecified chronic kidney disease: Secondary | ICD-10-CM

## 2017-02-22 DIAGNOSIS — Z885 Allergy status to narcotic agent status: Secondary | ICD-10-CM

## 2017-02-22 DIAGNOSIS — Z7901 Long term (current) use of anticoagulants: Secondary | ICD-10-CM

## 2017-02-22 DIAGNOSIS — I825Y1 Chronic embolism and thrombosis of unspecified deep veins of right proximal lower extremity: Secondary | ICD-10-CM | POA: Diagnosis not present

## 2017-02-22 DIAGNOSIS — N183 Chronic kidney disease, stage 3 (moderate): Secondary | ICD-10-CM | POA: Diagnosis not present

## 2017-02-22 DIAGNOSIS — Z882 Allergy status to sulfonamides status: Secondary | ICD-10-CM | POA: Diagnosis not present

## 2017-02-22 DIAGNOSIS — I824Y1 Acute embolism and thrombosis of unspecified deep veins of right proximal lower extremity: Secondary | ICD-10-CM

## 2017-02-22 DIAGNOSIS — N189 Chronic kidney disease, unspecified: Secondary | ICD-10-CM

## 2017-02-22 DIAGNOSIS — I2782 Chronic pulmonary embolism: Secondary | ICD-10-CM | POA: Diagnosis not present

## 2017-02-22 DIAGNOSIS — Z87828 Personal history of other (healed) physical injury and trauma: Secondary | ICD-10-CM

## 2017-02-22 LAB — CBC WITH DIFFERENTIAL/PLATELET
BASOS ABS: 0 10*3/uL (ref 0.0–0.1)
Basophils Relative: 1 %
EOS ABS: 0.3 10*3/uL (ref 0.0–0.7)
EOS PCT: 6 %
HCT: 38.8 % — ABNORMAL LOW (ref 39.0–52.0)
Hemoglobin: 13 g/dL (ref 13.0–17.0)
LYMPHS ABS: 1.5 10*3/uL (ref 0.7–4.0)
Lymphocytes Relative: 29 %
MCH: 31.6 pg (ref 26.0–34.0)
MCHC: 33.5 g/dL (ref 30.0–36.0)
MCV: 94.4 fL (ref 78.0–100.0)
MONO ABS: 0.3 10*3/uL (ref 0.1–1.0)
Monocytes Relative: 6 %
Neutro Abs: 3 10*3/uL (ref 1.7–7.7)
Neutrophils Relative %: 58 %
PLATELETS: 206 10*3/uL (ref 150–400)
RBC: 4.11 MIL/uL — AB (ref 4.22–5.81)
RDW: 13.3 % (ref 11.5–15.5)
WBC: 5.1 10*3/uL (ref 4.0–10.5)

## 2017-02-22 LAB — D-DIMER, QUANTITATIVE (NOT AT ARMC): D DIMER QUANT: 0.4 ug{FEU}/mL (ref 0.00–0.50)

## 2017-02-22 MED ORDER — APIXABAN 2.5 MG PO TABS: 2.5000 mg | ORAL_TABLET | Freq: Two times a day (BID) | ORAL | 6 refills | Status: DC

## 2017-02-22 NOTE — Patient Instructions (Addendum)
Decrease Eliquis to 2.5 mg twice daily To lab today Return visit 6 months lab 1 week before visit

## 2017-02-22 NOTE — Progress Notes (Signed)
Hematology and Oncology Follow Up Visit  Roberto Reid [id number] 12-03-1932 83 y.o. 02/22/2017 11:28 AM   Principle Diagnosis: Encounter Diagnoses  Name Primary?  . DVT, lower extremity, proximal, acute, right ([place]) Yes  . 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 In addition to the transient risk factors of pulmonary infection, he has had chronic problems with his right leg which she relates to a previous brown recluse spider bite in 2007. He had a previous tendon injury of that leg as well requiring surgical repair in May 2016.   Interim History:   He is asymptomatic at this time and he denies any dyspnea, chest pain, chest pressure. He has chronic swelling and discomfort of his right calf unchanged. He continues on full dose Eloquis 5 mg twice daily. I note renal function done on 01/04/2017 has declined with rising creatinine to 1.6 compared with 1.4 in March which is his approximate baseline. Creatinine already 1.3 in July 2013. He is had no other interim medical issues.  Medications: reviewed  Allergies:  Allergies  Allergen Reactions  . Oxycodone-Acetaminophen Other (See Comments)    confusion  . Percocet [Oxycodone-Acetaminophen]     Wild hallucinations. He states he has tolerated plain oxycodone in the past.   . Sulfa Antibiotics   . Sulfamethoxazole Rash    Review of Systems: See interim history Remaining ROS negative:   Physical Exam: Blood pressure (!) 157/58, pulse 92, temperature 97.6 F (36.4 C), weight 188 lb 8 oz (85.5 kg), SpO2 98 %. Wt Readings from Last 3 Encounters:  02/22/17 188 lb 8 oz (85.5 kg)  01/19/17 187 lb (84.8 kg)  01/04/17 187 lb (84.8 kg)      General appearance: Well-nourished Caucasian man HENNT: Pharynx no erythema, exudate, mass, or ulcer. No thyromegaly or thyroid nodules Lymph nodes: No cervical, supraclavicular, or axillary lymphadenopathy Breasts:  Lungs: Clear to auscultation, resonant to percussion throughout Heart: Regular rhythm, no murmur, no gallop, no rub, no click, no edema Abdomen: Soft, nontender, normal bowel sounds, no mass, no organomegaly Extremities: Chronic swelling, erythema, and warmth, right leg from the knee down. Nocalf tenderness. Approximate 3 cm difference in calf girth right compared with left.  Musculoskeletal: no joint deformities GU:  Vascular: Carotid pulses 2+, no bruits,  Neurologic: Alert, oriented, PERRLA, cranial nerves grossly normal, motor strength 5 over 5, reflexes 1+ symmetric, upper body coordination normal, gait normal, Skin: No rash or ecchymosis  Lab Results: CBC W/Diff    Component Value Date/Time   WBC 7.4 08/24/2016 1519   RBC 4.30 08/24/2016 1519   HGB 13.8 08/24/2016 1519   HCT 41.3 08/24/2016 1519   PLT 249 08/24/2016 1519   MCV 96.0 08/24/2016 1519   MCH 32.1 08/24/2016 1519   MCHC 33.4 08/24/2016 1519   RDW 12.7 08/24/2016 1519   LYMPHSABS 2.1 08/24/2016 1519   MONOABS 0.6 08/24/2016 1519   EOSABS 0.3 08/24/2016 1519   BASOSABS 0.0 08/24/2016 1519     Chemistry      Component Value Date/Time   NA 141 01/04/2017 1648   K 4.8 01/04/2017 1648   CL 104 01/04/2017 1648   CO2 21 01/04/2017 1648   BUN 26 (H) 01/04/2017 1648   CREATININE 1.58 (H) 01/04/2017 1648  Component Value Date/Time   CALCIUM 9.2 01/04/2017 1648   ALKPHOS 33 (L) 08/24/2016 1519   AST 17 08/24/2016 1519   ALT 10 (L) 08/24/2016 1519   BILITOT 1.0 08/24/2016 1519        Radiological Studies: No results found.  Impression:  #1. Status post extensive proximal right lower extremity DVT and low volume bilateral pulmonary emboli January 2018. Likely provoked by a prolonged  respiratory illness and chronic inflammatory changes of his right lower extremity. We discussed his thrombotic risk going into the future. I think the extensive nature of the clots in his legs, anatomical distortion, and chronic swelling as well as his age are ongoing risk factors. We discussed reducing his anticoagulant dose in half. Actually given his declining renal function we would need to do this anyway. He is instructed to decrease the Eliquis to 2.5 mg twice daily. I am repeating his BUN and creatinine today. If his creatinine clearance is consistently less than 30 mL/m, he would best be served by warfarin and not one of the  Xa inhibitors.  #2. CKD3  borderline 4.see discussion above    #3. Essential hypertension   CC: Patient Care Team: Elby Showers, MD as PCP - General (Internal Medicine)   Murriel Hopper, MD, [place]  Hematology-Oncology/Internal Medicine     9/24/201811:28 AM

## 2017-02-23 ENCOUNTER — Telehealth: Payer: Self-pay | Admitting: *Deleted

## 2017-02-23 LAB — BASIC METABOLIC PANEL
BUN/Creatinine Ratio: 19 (ref 10–24)
BUN: 31 mg/dL — ABNORMAL HIGH (ref 8–27)
CALCIUM: 8.8 mg/dL (ref 8.6–10.2)
CO2: 22 mmol/L (ref 20–29)
Chloride: 104 mmol/L (ref 96–106)
Creatinine, Ser: 1.61 mg/dL — ABNORMAL HIGH (ref 0.76–1.27)
GFR, EST AFRICAN AMERICAN: 45 mL/min/{1.73_m2} — AB (ref >59)
GFR, EST NON AFRICAN AMERICAN: 39 mL/min/{1.73_m2} — AB (ref >59)
Glucose: 99 mg/dL (ref 65–99)
POTASSIUM: 4.7 mmol/L (ref 3.5–5.2)
SODIUM: 141 mmol/L (ref 134–144)

## 2017-02-23 NOTE — Telephone Encounter (Signed)
-----   Message from Annia Belt, MD sent at 02/23/2017  8:56 AM EDT ----- Call pt: d-dimer now normal. Creatinine remains elevated at 1.6 up from previous baseline of 1.4. I will forward results to Dr Renold Genta. Calculated renal function 39% so OK to stay on reduced dose of Eliquis for now.

## 2017-02-23 NOTE — Telephone Encounter (Signed)
Called pt - no answer; left message to give me a call back. 

## 2017-02-24 ENCOUNTER — Telehealth: Payer: Self-pay

## 2017-02-24 ENCOUNTER — Telehealth: Payer: Self-pay | Admitting: Internal Medicine

## 2017-02-24 DIAGNOSIS — R7989 Other specified abnormal findings of blood chemistry: Secondary | ICD-10-CM

## 2017-02-24 NOTE — Telephone Encounter (Signed)
Called pt again - no answer; left message "d-dimer now normal. Creatinine remains elevated at 1.6 up from previous baseline of 1.4. I will forward results to Dr Renold Genta. Calculated renal function 39% so OK to stay on reduced dose of Eliquis for now." per Dr Beryle Beams. And to call for any questions.

## 2017-02-24 NOTE — Telephone Encounter (Signed)
-----   Message from Elby Showers, MD sent at 02/24/2017 11:42 AM EDT ----- Have spoken with pt. Needs  Ultrasound of kidneys and I will order. He is aware of impending appt and need for referral to [facility].

## 2017-02-24 NOTE — Telephone Encounter (Signed)
Persistent elevated serum creatinine on recent visit to Dr. Jim Desanctis. Order renal artery stenosis ultrasound. Patient aware. Also refer to [facility].

## 2017-02-25 ENCOUNTER — Telehealth: Payer: Self-pay | Admitting: Internal Medicine

## 2017-02-25 MED ORDER — HYDROCODONE-ACETAMINOPHEN 10-325 MG PO TABS: 1.0000 | ORAL_TABLET | Freq: Four times a day (QID) | ORAL | 0 refills | Status: DC

## 2017-02-25 NOTE — Telephone Encounter (Signed)
Patient calling to request refill on his Hydrocodone 10-325.  He would like to come by today and pick this up.    Thank you.   Best # for contact: [phone]

## 2017-02-25 NOTE — Telephone Encounter (Signed)
Printed RX, pt aware

## 2017-02-25 NOTE — Telephone Encounter (Signed)
Refill for 31 days

## 2017-03-02 ENCOUNTER — Other Ambulatory Visit: Payer: Medicare Other

## 2017-03-08 ENCOUNTER — Telehealth: Payer: Self-pay

## 2017-03-08 MED ORDER — QUETIAPINE FUMARATE 50 MG PO TABS: 50.0000 mg | ORAL_TABLET | Freq: Every day | ORAL | 5 refills | Status: DC

## 2017-03-08 NOTE — Telephone Encounter (Signed)
Received fax from [facility] in regards to a refill on Seroquel 50mg  for patient. Medication was refilled per Dr. Verlene Mayer request. Sent 6 months

## 2017-03-15 ENCOUNTER — Telehealth: Payer: Self-pay | Admitting: Internal Medicine

## 2017-03-15 DIAGNOSIS — Z029 Encounter for administrative examinations, unspecified: Secondary | ICD-10-CM

## 2017-03-15 MED ORDER — LEVOFLOXACIN 500 MG PO TABS: 500.0000 mg | ORAL_TABLET | Freq: Every day | ORAL | 0 refills | Status: DC

## 2017-03-15 NOTE — Telephone Encounter (Signed)
Sent and pt is aware

## 2017-03-15 NOTE — Telephone Encounter (Signed)
Wants to know if you will call in a prophylactic antibiotic for him.  He will be traveling quite a bit in the next several days.  He feels he may need to have this with him.  He is leaving in the morning.  Would like to pick this up later today.   Pharmacy:  [facility] number for contact:  [phone]  Thank you.

## 2017-03-15 NOTE — Telephone Encounter (Signed)
Call in Levaquin 500 mg #10 one po daily

## 2017-03-19 IMAGING — CT CT CHEST W/O CM
2 of 4 series · 15 of 36 positions shown, 18 images · non-contrast
Comparison: None.

CLINICAL DATA: Shortness of breath. Leukocytosis. Clear chest x-ray
with concern for occult pneumonia.

EXAM:
CT CHEST WITHOUT CONTRAST
TECHNIQUE: Multidetector CT imaging of the chest was performed following the
standard protocol without IV contrast.

[Series 3: thorax 2.0 · axial · 0.71mm/px · z∈[+77,+363]mm · 12 of 161 slices shown, 15 images]
[im 9/161  mediastinal]
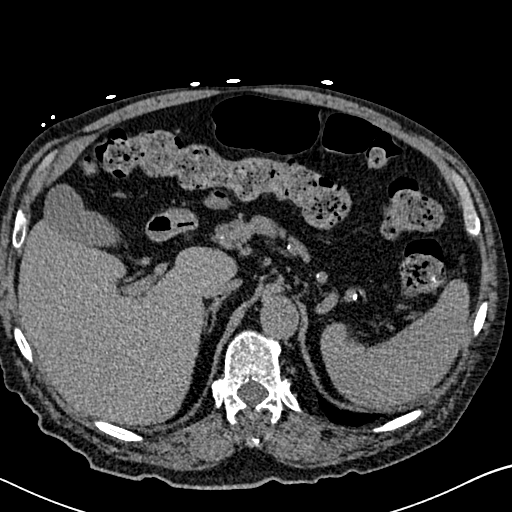
[im 9/161  lung]
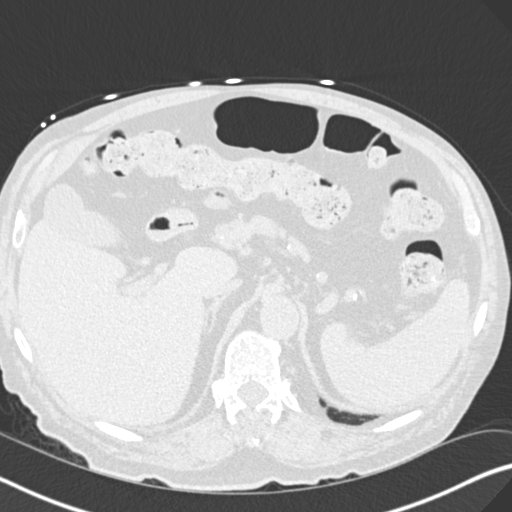
[im 26/161  lung]
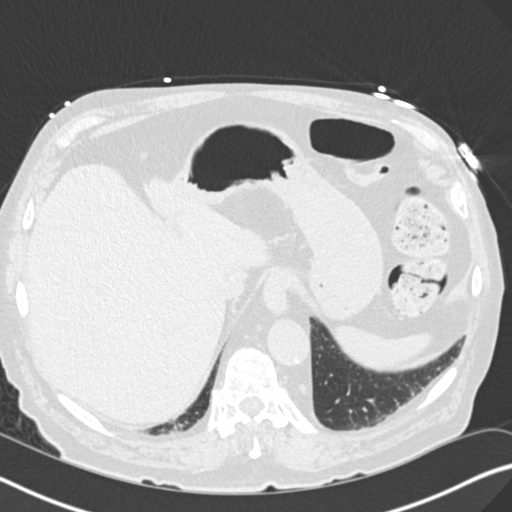
[im 34/161  lung]
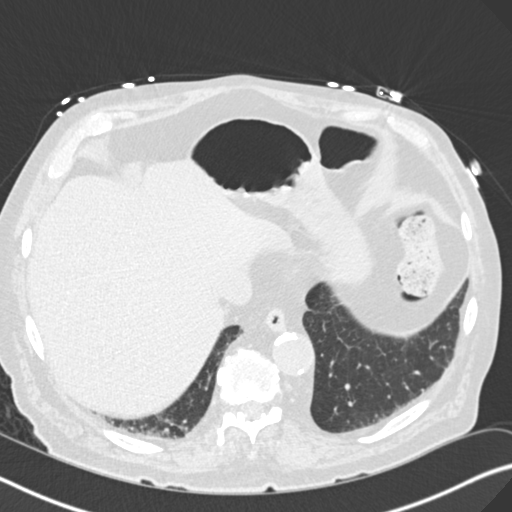
[im 51/161  lung]
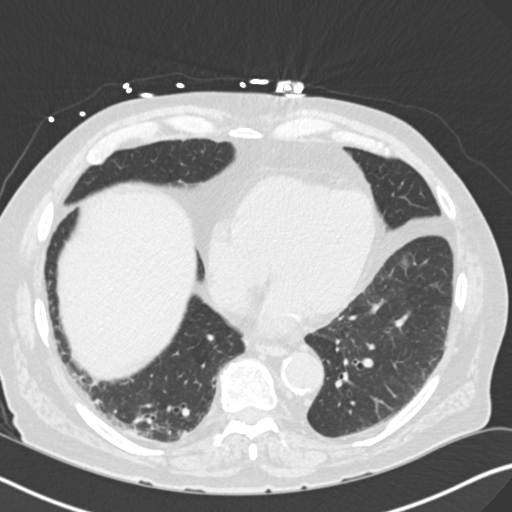
[im 59/161  mediastinal]
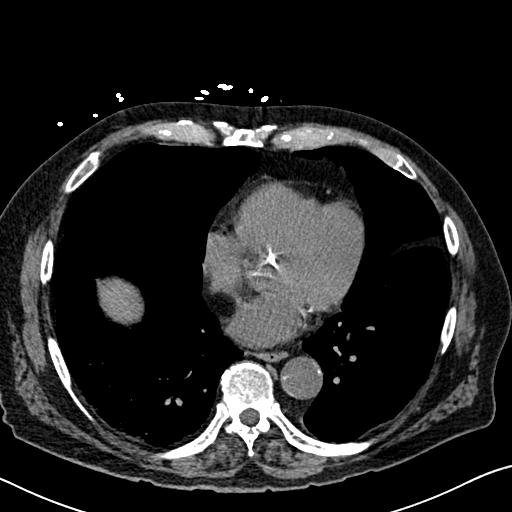
[im 59/161  lung]
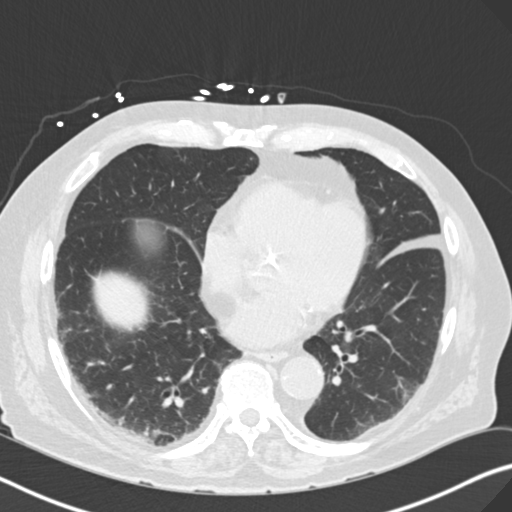
[im 76/161  lung]
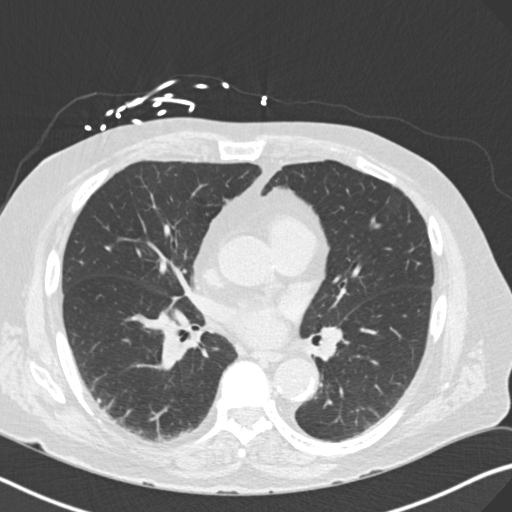
[im 85/161  lung]
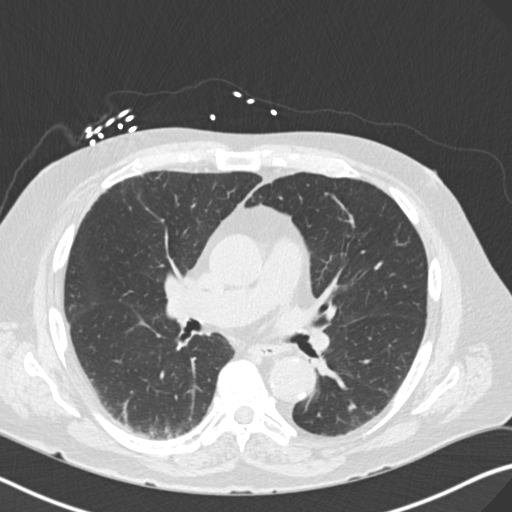
[im 102/161  lung]
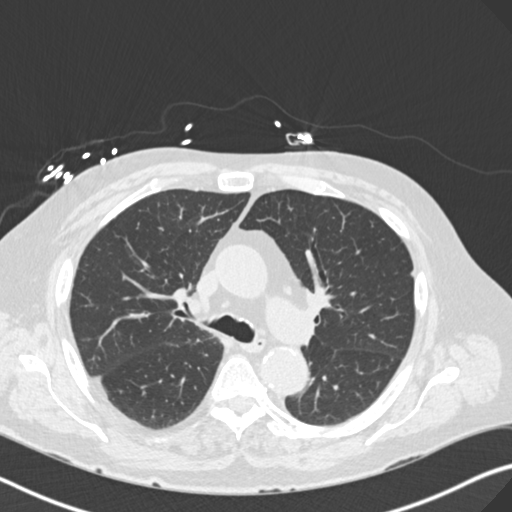
[im 110/161  mediastinal]
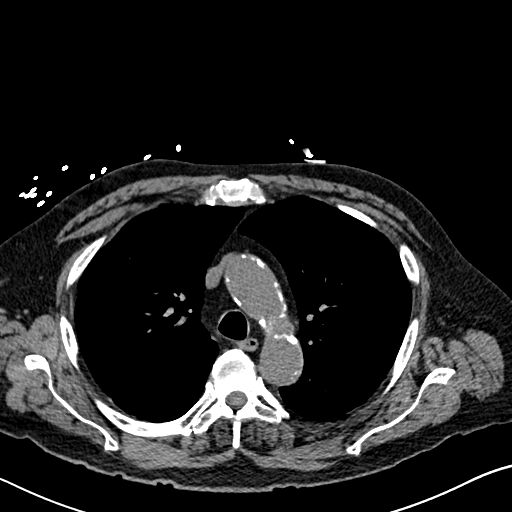
[im 110/161  lung]
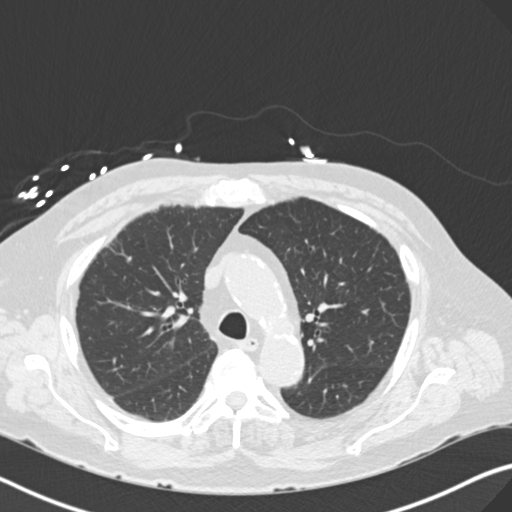
[im 127/161  lung]
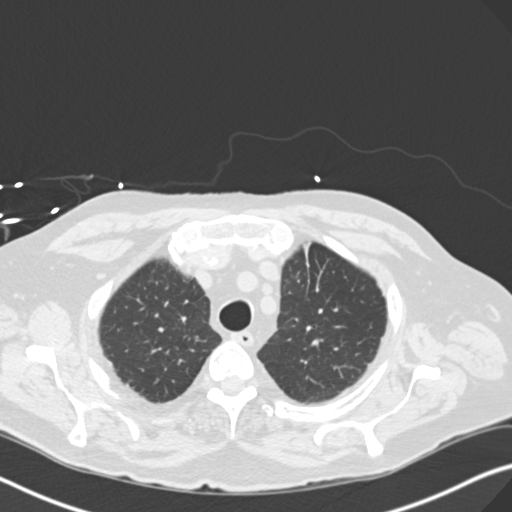
[im 135/161  lung]
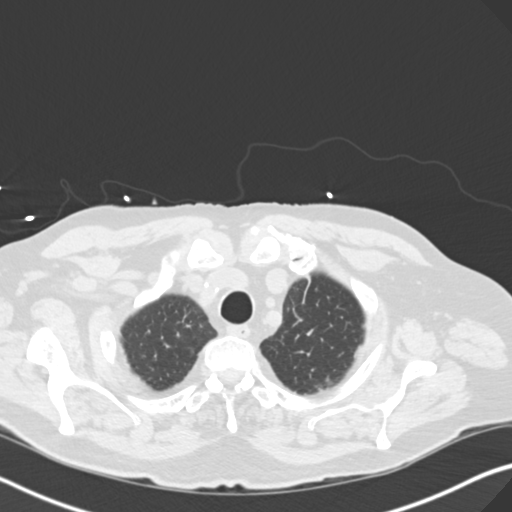
[im 152/161  lung]
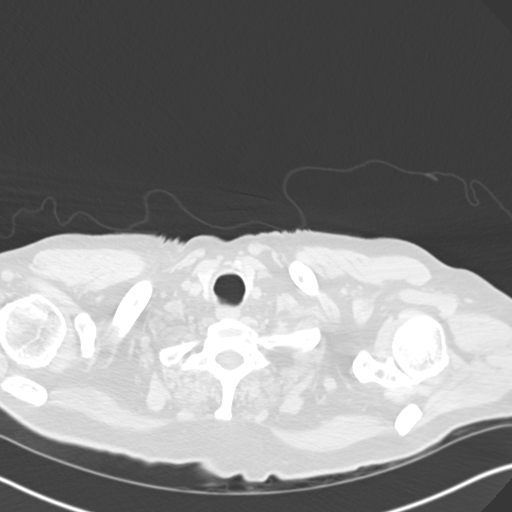

[Series 5: coronal · coronal · 0.63mm/px · 3 of 101 slices shown]
[im 21/101  lung]
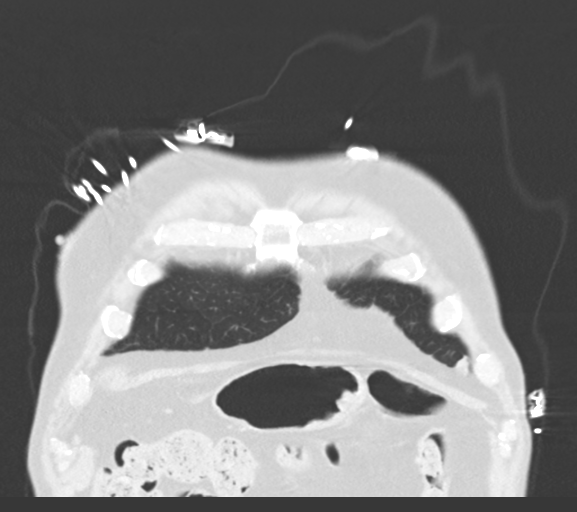
[im 41/101  lung]
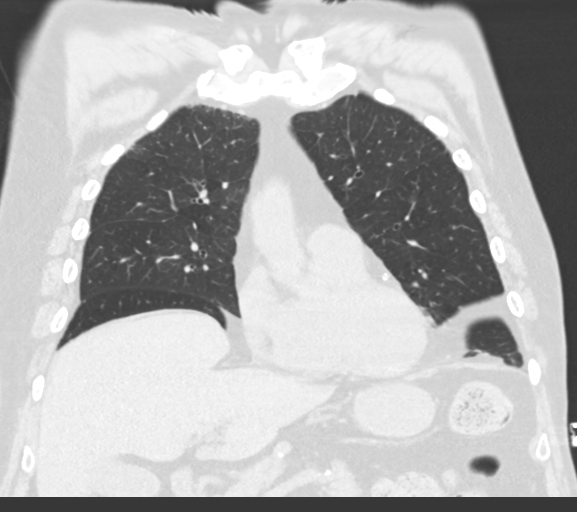
[im 61/101  lung]
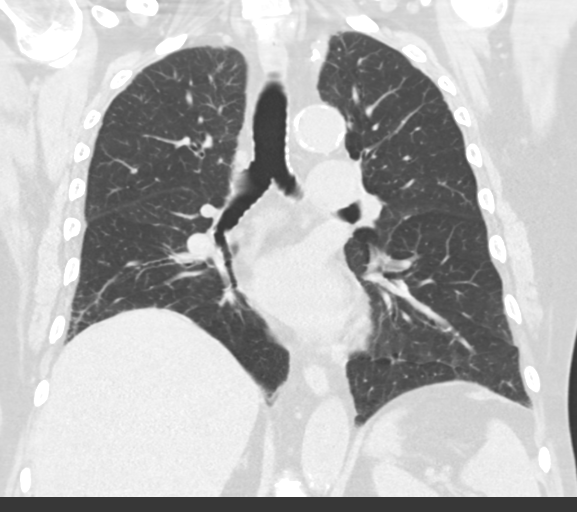

[15 of 36 positions shown; findings below may reference images not displayed]

FINDINGS: Cardiovascular: No cardiomegaly or pericardial effusion.
Atherosclerosis. Moderate aortic valve calcification.

Mediastinum/Nodes: Negative for adenopathy or mass

Lungs/Pleura: No consolidation or pulmonary edema. No effusion or
pneumothorax.

Nonspecific subpleural reticulation is likely combination of
dependent atelectasis and scarring. Right lower lobe segmental
airways are mildly undulating, but there is no honeycombing to
suggest a primary fibrotic process. No suspicious pulmonary nodule.
Minimal patchy ground-glass density over the diaphragm bilaterally
is likely atelectasis.

Upper Abdomen: Cholelithiasis.  No acute finding.

Musculoskeletal: No acute or aggressive finding
IMPRESSION: 1. Negative for pneumonia or other acute finding.
2. Cholelithiasis and other chronic findings are described above.

## 2017-03-25 ENCOUNTER — Telehealth: Payer: Self-pay | Admitting: Internal Medicine

## 2017-03-25 MED ORDER — HYDROCODONE-ACETAMINOPHEN 10-325 MG PO TABS: 1.0000 | ORAL_TABLET | Freq: Four times a day (QID) | ORAL | 0 refills | Status: DC

## 2017-03-25 NOTE — Telephone Encounter (Signed)
Calling refill for patients Hydrocodone 10-325mg .  Looks like it was last filled on 9/27.  Wife states he would like to pick it up tomorrow.    Best # for contact:  [phone]  Thank you.

## 2017-03-25 NOTE — Telephone Encounter (Signed)
Refill once 

## 2017-03-25 NOTE — Telephone Encounter (Signed)
LVM for pt that RX is ready

## 2017-04-02 ENCOUNTER — Telehealth: Payer: Self-pay

## 2017-04-02 MED ORDER — LEVOFLOXACIN 500 MG PO TABS: 500.0000 mg | ORAL_TABLET | Freq: Every day | ORAL | 0 refills | Status: DC

## 2017-04-02 MED ORDER — ZOLPIDEM TARTRATE 10 MG PO TABS: 10.0000 mg | ORAL_TABLET | Freq: Every day | ORAL | 1 refills | Status: DC

## 2017-04-02 NOTE — Telephone Encounter (Signed)
I thought we just refilled this last week or so for a trip. Please check into recent phone calls.

## 2017-04-02 NOTE — Telephone Encounter (Signed)
Pt stated he keeps Levaquin on hand and had to use it for his cellulitis so he no longer has any. Would like a new RX sent in for this to [place]. Please advise

## 2017-04-02 NOTE — Telephone Encounter (Signed)
Pt is aware.  

## 2017-04-02 NOTE — Telephone Encounter (Signed)
Received fax from [facility] in regards to a refill on Ambien 10mg  for patient. Medication was refilled per Dr. Verlene Mayer request. Sent 6 months

## 2017-04-02 NOTE — Telephone Encounter (Signed)
Yes we did, and he took the medication for a case of Cellulitis he says and would like it refilled again since he now is out. Please advise.

## 2017-04-02 NOTE — Telephone Encounter (Signed)
Ok refill once

## 2017-04-16 ENCOUNTER — Encounter: Payer: Self-pay | Admitting: Internal Medicine

## 2017-04-16 ENCOUNTER — Ambulatory Visit (INDEPENDENT_AMBULATORY_CARE_PROVIDER_SITE_OTHER): Payer: Medicare Other | Admitting: Internal Medicine

## 2017-04-16 ENCOUNTER — Telehealth: Payer: Self-pay | Admitting: Internal Medicine

## 2017-04-16 VITALS — BP 118/72 | HR 78 | Temp 97.4°F | Wt 177.0 lb

## 2017-04-16 DIAGNOSIS — I10 Essential (primary) hypertension: Secondary | ICD-10-CM | POA: Diagnosis not present

## 2017-04-16 DIAGNOSIS — R7989 Other specified abnormal findings of blood chemistry: Secondary | ICD-10-CM

## 2017-04-16 DIAGNOSIS — M79671 Pain in right foot: Secondary | ICD-10-CM

## 2017-04-16 DIAGNOSIS — G8929 Other chronic pain: Secondary | ICD-10-CM | POA: Diagnosis not present

## 2017-04-16 MED ORDER — HYDROCODONE-ACETAMINOPHEN 10-325 MG PO TABS: 1.0000 | ORAL_TABLET | Freq: Four times a day (QID) | ORAL | 0 refills | Status: DC

## 2017-04-16 MED ORDER — TESTOSTERONE CYPIONATE 200 MG/ML IM SOLN
300.0000 mg | INTRAMUSCULAR | Status: DC
  Administered 2017-04-16: 300 mg via INTRAMUSCULAR

## 2017-04-16 NOTE — Progress Notes (Signed)
   Subjective:    Patient ID: Roberto Reid, male    DOB: 08-Feb-1933, 83 y.o.   MRN: [id number]  HPI 83 year old Male comes in today for pain management visit.  History of chronic pain related to foot issue.  Says pain is under good control on current regimen.  Does not want to change regimen.  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  Would like Depakote testosterone  injection today which was given.  Blood pressure stable on current regimen.  Recently had cellulitis and needed Levaquin prescription.  Would like this refilled to have on hand.  History of chronic anticoagulation status post bilateral pulmonary emboli followed by Dr. Beryle Beams.        Review of Systems see above     Objective:   Physical Exam  Neck supple.  No thyromegaly or carotid bruits.  Chest clear.  Cardiac exam regular rate and rhythm normal S1 and S2.  No change in foot deformity.  No lower extremity edema.  No evidence of cellulitis.      Assessment & Plan:  Chronic pain management related to chronic foot pain-stable on current regimen.  Refill medication as previously prescribed.  Return in 3 months.  Will need physical examination early 2019  History of cellulitis lower extremity-refill Levaquin  Low testosterone-Depo testosterone injection given  Essential hypertension stable  History of impaired glucose tolerance  Plan: Has follow-up appointment here in February for pain management.

## 2017-04-16 NOTE — Telephone Encounter (Signed)
He is past due for 3 month chronic pain management visit. It is not on book. Please add him in this afternoon and tell him he needs brief visit.

## 2017-04-16 NOTE — Telephone Encounter (Signed)
Coming in at 4pm

## 2017-04-16 NOTE — Telephone Encounter (Signed)
Patient calling wanting to know if he can go ahead and pick up his pain medication with a post dated Rx for 11/24 for next week.  He will be traveling the majority of next week.  He would like to pick this up this afternoon if possible.    He also needs his Cozaar refilled to [facility] please.    Thank you.

## 2017-04-25 ENCOUNTER — Encounter: Payer: Self-pay | Admitting: Internal Medicine

## 2017-04-25 NOTE — Patient Instructions (Addendum)
Testosterone injection given today.  Chronic pain medication refilled.  Follow-up in 3 months.

## 2017-04-30 ENCOUNTER — Encounter: Payer: Self-pay | Admitting: Internal Medicine

## 2017-04-30 ENCOUNTER — Telehealth: Payer: Self-pay | Admitting: Internal Medicine

## 2017-04-30 NOTE — Telephone Encounter (Signed)
Received fax from [facility] asking [place] to refill AndroGel.  Says patient wants Dr. Renold Genta to refill prescription.  [facility]  tried to get this approved with" cover my meds" and decision is pending.  I will be away next week from the office.  If this cannot be approved by [employer], patient will have no choice but to see urologist to get this approved unless he wants to pay out-of-pocket.

## 2017-04-30 NOTE — Telephone Encounter (Signed)
Received refill request on AndroGel pump.  Called pharmacy.  They said it was requested by patient.  We just recently gave him testosterone injection through this office on November 16.  Explained this to the pharmacy.  Explained to them that we have trouble getting AndroGel approved by [employer].  They offered to send the refill to Dr. Irine Seal, urologist.  It is likely that patient will need an appointment with Dr. Jeffie Pollock to get this refilled

## 2017-05-14 ENCOUNTER — Other Ambulatory Visit: Payer: Self-pay

## 2017-05-14 NOTE — Telephone Encounter (Signed)
Would you please call [facility]- I thought we took care of this last week.

## 2017-05-14 NOTE — Telephone Encounter (Signed)
Patient called to request a refill on his androgel. H e said he won't be able to see his urologist until 5-6 months from now.

## 2017-05-14 NOTE — Telephone Encounter (Signed)
Verbal order by Dr. Renold Genta to call Testosterone/Androgel to [place].  Called in, 0 refill.  Advised pharmacy patient will be cash pay as his insurance will not approve.

## 2017-05-21 ENCOUNTER — Telehealth: Payer: Self-pay | Admitting: Internal Medicine

## 2017-05-21 MED ORDER — HYDROCODONE-ACETAMINOPHEN 10-325 MG PO TABS: 1.0000 | ORAL_TABLET | Freq: Four times a day (QID) | ORAL | 0 refills | Status: DC

## 2017-05-21 NOTE — Telephone Encounter (Signed)
Patient calling to request refill on his Norco 10-325.  States that it is due on 12/24 but he'd like to pick it up this afternoon please.  Can we date it for 12/24 so that he can pick it up this afternoon and it wouldn't be filled until Monday?    Thank you.

## 2017-05-21 NOTE — Telephone Encounter (Signed)
Refill once and Date it for 12/24

## 2017-05-21 NOTE — Telephone Encounter (Signed)
Printed

## 2017-05-21 NOTE — Telephone Encounter (Signed)
Pt was notified rx is ready for pick up.

## 2017-06-11 DIAGNOSIS — N5201 Erectile dysfunction due to arterial insufficiency: Secondary | ICD-10-CM | POA: Diagnosis not present

## 2017-06-25 ENCOUNTER — Other Ambulatory Visit: Payer: Self-pay

## 2017-06-25 ENCOUNTER — Other Ambulatory Visit: Payer: Self-pay | Admitting: Internal Medicine

## 2017-06-25 MED ORDER — HYDROCODONE-ACETAMINOPHEN 10-325 MG PO TABS: 1.0000 | ORAL_TABLET | Freq: Four times a day (QID) | ORAL | 0 refills | Status: DC

## 2017-06-25 MED ORDER — AMLODIPINE BESYLATE 5 MG PO TABS: 5.0000 mg | ORAL_TABLET | Freq: Every day | ORAL | 3 refills | Status: DC

## 2017-06-25 NOTE — Telephone Encounter (Signed)
Refill once 

## 2017-06-25 NOTE — Telephone Encounter (Signed)
Patient was advised to pick up after 2pm.

## 2017-06-25 NOTE — Telephone Encounter (Signed)
Calling to request refill on his pain medication.  Would like to pick up this afternoon.

## 2017-07-13 ENCOUNTER — Ambulatory Visit: Payer: Medicare Other | Admitting: Internal Medicine

## 2017-07-13 ENCOUNTER — Encounter: Payer: Self-pay | Admitting: Internal Medicine

## 2017-07-13 VITALS — BP 110/62 | HR 60 | Ht 70.0 in | Wt 200.0 lb

## 2017-07-13 DIAGNOSIS — G8929 Other chronic pain: Secondary | ICD-10-CM | POA: Diagnosis not present

## 2017-07-13 DIAGNOSIS — Z7901 Long term (current) use of anticoagulants: Secondary | ICD-10-CM | POA: Diagnosis not present

## 2017-07-13 DIAGNOSIS — Z7189 Other specified counseling: Secondary | ICD-10-CM

## 2017-07-13 DIAGNOSIS — Z7184 Encounter for health counseling related to travel: Secondary | ICD-10-CM

## 2017-07-13 DIAGNOSIS — M79671 Pain in right foot: Secondary | ICD-10-CM | POA: Diagnosis not present

## 2017-07-13 MED ORDER — OSELTAMIVIR PHOSPHATE 75 MG PO CAPS: 75.0000 mg | ORAL_CAPSULE | Freq: Two times a day (BID) | ORAL | 0 refills | Status: DC

## 2017-07-13 MED ORDER — LEVOFLOXACIN 500 MG PO TABS: 500.0000 mg | ORAL_TABLET | Freq: Every day | ORAL | 0 refills | Status: DC

## 2017-07-22 ENCOUNTER — Telehealth: Payer: Self-pay | Admitting: Internal Medicine

## 2017-07-22 MED ORDER — HYDROCODONE-ACETAMINOPHEN 10-325 MG PO TABS: 1.0000 | ORAL_TABLET | Freq: Four times a day (QID) | ORAL | 0 refills | Status: DC

## 2017-07-22 NOTE — Telephone Encounter (Signed)
Calling to request refill on his Norco 10-325.  States that he knows that this expires on Monday, so he would like to pick up the Rx tomorrow so he can get this filled.  Can it be printed on the Rx not to be filled until Monday, 2/25?

## 2017-07-22 NOTE — Telephone Encounter (Signed)
Noted fill date on rx.

## 2017-07-22 NOTE — Telephone Encounter (Signed)
Refill once with notation not to fill until Feb 25

## 2017-07-25 NOTE — Patient Instructions (Addendum)
okay to refill Norco 10/325 up to 4 times daily as needed for pain.  Physical exam with next chronic pain management visit in 3 months.  We prescribe Levaquin for travel since he tends to get cellulitis of his lower extremity sometimes will travel.

## 2017-07-25 NOTE — Progress Notes (Signed)
   Subjective:    Patient ID: Roberto Reid, male    DOB: 03-30-1933, 84 y.o.   MRN: [id number]  HPI 84 year old Male with chronic foot pain in today for chronic pain medication visit.  He does not abuse his medication.  He feels that pain control is adequate.  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  Network is now ended and he is back at home.  He will be going to the [place] some in the near future to do some consulting as well.  Long-standing issues with right foot causing chronic pain.  Has seen Dr. Para March at [facility] in 2017 and was diagnosed with acquired pes planovalgus and right midfoot collapse.  He has tried orthotics but these were uncomfortable.  He likes to play golf for exercise.  Main source of pain is fourth mallet toe.  Please see dictation from May 2018 regarding x-ray from [facility].  He has no new complaints.  Sometimes he gets cellulitis in his leg which we generally treat with Levaquin.  He has not had physical exam in some time and we will arrange for that in the next few months.  Remains on chronic anticoagulation for history of pulmonary embolus and will be following up with Dr. Beryle Beams soon.    Review of Systems no new complaints     Objective:   Physical Exam  Chest clear.  Cardiac exam regular rate and rhythm.  No lower extremity edema.  No evidence of lower leg cellulitis      Assessment & Plan:  Chronic right foot pain requiring narcotic medication.  The patient should have physical exam in 3 months with next pain management visit.

## 2017-07-26 ENCOUNTER — Other Ambulatory Visit: Payer: Self-pay

## 2017-07-26 MED ORDER — LOSARTAN POTASSIUM 100 MG PO TABS: 100.0000 mg | ORAL_TABLET | Freq: Every day | ORAL | 3 refills | Status: DC

## 2017-08-18 DIAGNOSIS — L570 Actinic keratosis: Secondary | ICD-10-CM | POA: Diagnosis not present

## 2017-08-18 DIAGNOSIS — L820 Inflamed seborrheic keratosis: Secondary | ICD-10-CM | POA: Diagnosis not present

## 2017-08-18 DIAGNOSIS — C44629 Squamous cell carcinoma of skin of left upper limb, including shoulder: Secondary | ICD-10-CM | POA: Diagnosis not present

## 2017-08-18 DIAGNOSIS — X32XXXD Exposure to sunlight, subsequent encounter: Secondary | ICD-10-CM | POA: Diagnosis not present

## 2017-08-20 ENCOUNTER — Telehealth: Payer: Self-pay | Admitting: Internal Medicine

## 2017-08-20 DIAGNOSIS — R7989 Other specified abnormal findings of blood chemistry: Secondary | ICD-10-CM

## 2017-08-20 MED ORDER — HYDROCODONE-ACETAMINOPHEN 10-325 MG PO TABS: 1.0000 | ORAL_TABLET | Freq: Four times a day (QID) | ORAL | 0 refills | Status: DC

## 2017-08-20 NOTE — Telephone Encounter (Signed)
Please refill for March 25

## 2017-08-20 NOTE — Telephone Encounter (Signed)
Left vm RX ready for pick up after 2pm.

## 2017-08-20 NOTE — Telephone Encounter (Signed)
Would like to come and pick up his Norco Rx this afternoon so he can have it filled on 3/25.  He wants to know if he can pick it up this afternoon and if you can put a date on it that he can get it filled on Monday, 3/25 because he's traveling on Tuesday, 3/26.    Thank you.    Phone:  [phone]

## 2017-08-23 ENCOUNTER — Other Ambulatory Visit (INDEPENDENT_AMBULATORY_CARE_PROVIDER_SITE_OTHER): Payer: Medicare Other

## 2017-08-23 DIAGNOSIS — I824Y1 Acute embolism and thrombosis of unspecified deep veins of right proximal lower extremity: Secondary | ICD-10-CM | POA: Diagnosis not present

## 2017-08-23 DIAGNOSIS — N189 Chronic kidney disease, unspecified: Secondary | ICD-10-CM

## 2017-08-23 DIAGNOSIS — I2782 Chronic pulmonary embolism: Secondary | ICD-10-CM | POA: Diagnosis not present

## 2017-08-24 ENCOUNTER — Telehealth: Payer: Self-pay | Admitting: *Deleted

## 2017-08-24 ENCOUNTER — Encounter: Payer: Self-pay | Admitting: Internal Medicine

## 2017-08-24 LAB — BASIC METABOLIC PANEL
BUN/Creatinine Ratio: 17 (ref 10–24)
BUN: 34 mg/dL — ABNORMAL HIGH (ref 8–27)
CO2: 22 mmol/L (ref 20–29)
Calcium: 8.8 mg/dL (ref 8.6–10.2)
Chloride: 101 mmol/L (ref 96–106)
Creatinine, Ser: 1.95 mg/dL — ABNORMAL HIGH (ref 0.76–1.27)
GFR calc Af Amer: 36 mL/min/{1.73_m2} — ABNORMAL LOW (ref >59)
GFR, EST NON AFRICAN AMERICAN: 31 mL/min/{1.73_m2} — AB (ref >59)
GLUCOSE: 115 mg/dL — AB (ref 65–99)
POTASSIUM: 4.8 mmol/L (ref 3.5–5.2)
Sodium: 138 mmol/L (ref 134–144)

## 2017-08-24 LAB — CBC WITH DIFFERENTIAL/PLATELET
BASOS: 0 %
Basophils Absolute: 0 10*3/uL (ref 0.0–0.2)
EOS (ABSOLUTE): 0.1 10*3/uL (ref 0.0–0.4)
EOS: 1 %
Hematocrit: 38.5 % (ref 37.5–51.0)
Hemoglobin: 13 g/dL (ref 13.0–17.7)
IMMATURE GRANS (ABS): 0 10*3/uL (ref 0.0–0.1)
IMMATURE GRANULOCYTES: 1 %
Lymphocytes Absolute: 1.5 10*3/uL (ref 0.7–3.1)
Lymphs: 20 %
MCH: 32.6 pg (ref 26.6–33.0)
MCHC: 33.8 g/dL (ref 31.5–35.7)
MCV: 97 fL (ref 79–97)
Monocytes Absolute: 0.5 10*3/uL (ref 0.1–0.9)
Monocytes: 7 %
NEUTROS PCT: 71 %
Neutrophils Absolute: 5.6 10*3/uL (ref 1.4–7.0)
PLATELETS: 232 10*3/uL (ref 150–379)
RBC: 3.99 x10E6/uL — ABNORMAL LOW (ref 4.14–5.80)
RDW: 13.1 % (ref 12.3–15.4)
WBC: 7.7 10*3/uL (ref 3.4–10.8)

## 2017-08-24 NOTE — Telephone Encounter (Signed)
Pt called - no answer; left message "Hb stable @ 13 - no change from 6 mos ago.Creatinine has increased to 2 (1.95), was 1.6 " per Dr Colin Rhein. And call for any questions.

## 2017-08-24 NOTE — Telephone Encounter (Signed)
Creatinine is increasing slowly. Needs to see [employer]) and will order renal ultrasound.

## 2017-08-24 NOTE — Telephone Encounter (Signed)
-----   Message from Annia Belt, MD sent at 08/24/2017 11:55 AM EDT ----- Call pt: Hb stable @ 13 - no change from 6 mos ago.Creatinine has increased to 2, was 1.6

## 2017-08-27 ENCOUNTER — Other Ambulatory Visit: Payer: Self-pay

## 2017-08-27 MED ORDER — QUETIAPINE FUMARATE 50 MG PO TABS: 50.0000 mg | ORAL_TABLET | Freq: Every day | ORAL | 5 refills | Status: DC

## 2017-08-30 ENCOUNTER — Ambulatory Visit: Payer: Medicare Other | Admitting: Oncology

## 2017-08-30 ENCOUNTER — Encounter: Payer: Self-pay | Admitting: Oncology

## 2017-08-30 ENCOUNTER — Other Ambulatory Visit: Payer: Self-pay

## 2017-08-30 VITALS — BP 152/66 | HR 80 | Temp 97.5°F | Ht 70.0 in | Wt 191.9 lb

## 2017-08-30 DIAGNOSIS — I129 Hypertensive chronic kidney disease with stage 1 through stage 4 chronic kidney disease, or unspecified chronic kidney disease: Secondary | ICD-10-CM

## 2017-08-30 DIAGNOSIS — Z9889 Other specified postprocedural states: Secondary | ICD-10-CM | POA: Diagnosis not present

## 2017-08-30 DIAGNOSIS — I878 Other specified disorders of veins: Secondary | ICD-10-CM | POA: Diagnosis not present

## 2017-08-30 DIAGNOSIS — Z7901 Long term (current) use of anticoagulants: Secondary | ICD-10-CM

## 2017-08-30 DIAGNOSIS — Z86711 Personal history of pulmonary embolism: Secondary | ICD-10-CM

## 2017-08-30 DIAGNOSIS — Z882 Allergy status to sulfonamides status: Secondary | ICD-10-CM

## 2017-08-30 DIAGNOSIS — Z8619 Personal history of other infectious and parasitic diseases: Secondary | ICD-10-CM

## 2017-08-30 DIAGNOSIS — I2782 Chronic pulmonary embolism: Secondary | ICD-10-CM

## 2017-08-30 DIAGNOSIS — R509 Fever, unspecified: Secondary | ICD-10-CM

## 2017-08-30 DIAGNOSIS — N183 Chronic kidney disease, stage 3 (moderate): Secondary | ICD-10-CM | POA: Diagnosis not present

## 2017-08-30 DIAGNOSIS — I824Y1 Acute embolism and thrombosis of unspecified deep veins of right proximal lower extremity: Secondary | ICD-10-CM

## 2017-08-30 DIAGNOSIS — Z885 Allergy status to narcotic agent status: Secondary | ICD-10-CM

## 2017-08-30 HISTORY — DX: Long term (current) use of anticoagulants: Z79.01

## 2017-08-30 NOTE — Progress Notes (Signed)
Please make sure he has a Nephrology referral.

## 2017-08-30 NOTE — Progress Notes (Signed)
Hematology and Oncology Follow Up Visit  Roberto Reid [id number] 1933/02/02 84 y.o. 08/30/2017 4:46 PM   Principle Diagnosis: Encounter Diagnoses  Name Primary?  . Other chronic pulmonary embolism without acute cor pulmonale (H[facility]) Yes  . DVT, lower extremity, proximal, acute, right ([place])   . 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 In addition to the transient risk factors of pulmonary infection with a prolonged respiratory illness, he has had chronic problems with his right leg which he relates to a previous brown recluse spider bite in 2007. He had a previous tendon injury of that leg as well requiring surgical repair in May 2016.  For all of these reasons I recommended long-term anticoagulation.  In view of his age and stage III renal dysfunction, I reduced his Eliquis dose to 2.5 mg twice daily at time of his February 22, 2017 visit.  Interim History: Overall he is doing well.  He continues to have idiopathic recurrent low-grade fevers which totally set up his strength and seemed to respond promptly to short courses of antibiotics.  No associated night sweats.  No weight loss. He denies any dyspnea, chest pain, palpitations, any new leg swelling or pain.  He noticed one area of healing abrasion on his left calf.  Medications: reviewed  Allergies:  Allergies  Allergen Reactions  . Oxycodone-Acetaminophen Other (See Comments)    confusion  . Percocet [Oxycodone-Acetaminophen]     Wild hallucinations. He states he has tolerated plain oxycodone in the past.   . Sulfa Antibiotics   . Sulfamethoxazole Rash    Review of Systems:  Remaining ROS negative:   Physical  Exam: Blood pressure (!) 152/66, pulse 80, temperature (!) 97.5 F (36.4 C), temperature source Oral, height 5\' 10"  (1.778 m), weight 191 lb 14.4 oz (87 kg), SpO2 98 %. Wt Readings from Last 3 Encounters:  08/30/17 191 lb 14.4 oz (87 kg)  07/13/17 200 lb (90.7 kg)  04/16/17 177 lb (80.3 kg)     General appearance: Well-nourished Caucasian man HENNT: Pharynx no erythema, exudate, mass, or ulcer. No thyromegaly or thyroid nodules Lymph nodes: No cervical, supraclavicular, or axillary lymphadenopathy Breasts:  Lungs: Clear to auscultation, resonant to percussion throughout Heart: Regular rhythm, no murmur, no gallop, no rub, no click, no edema Abdomen: Soft, nontender, normal bowel sounds, no mass, no organomegaly Extremities: No edema, no calf tenderness.  Chronic venous stasis changes. Calf measurements: 32 cm on the right, 35 cm left Ankle measurements 23 cm right, 22 cm left Musculoskeletal: no joint deformities GU:  Vascular: Carotid pulses 2+, no bruits, distal pulses:  Neurologic: Alert, oriented, PERRLA, optic discs sharp and vessels normal, no hemorrhage or exudate, cranial nerves grossly normal, motor strength 5 over 5, reflexes 1+ symmetric, upper body coordination normal, gait normal, Skin: No rash or ecchymosis.  Small, linear, area over about 5 cm with there are superficial scabs that are healing from what looks like a minor abrasion.  Chronic changes and scarring on that leg from previous surgery and venous stasis changes.  Lab Results: CBC W/Diff    Component Value Date/Time   WBC 7.7 08/23/2017 1101   WBC 5.1 02/22/2017 1155   RBC 3.99 (L) 08/23/2017 1101   RBC 4.11 (L) 02/22/2017  1155   HGB 13.0 08/23/2017 1101   HCT 38.5 08/23/2017 1101   PLT 232 08/23/2017 1101   MCV 97 08/23/2017 1101   MCH 32.6 08/23/2017 1101   MCH 31.6 02/22/2017 1155   MCHC 33.8 08/23/2017 1101   MCHC 33.5 02/22/2017 1155   RDW 13.1 08/23/2017 1101   LYMPHSABS 1.5 08/23/2017 1101    MONOABS 0.3 02/22/2017 1155   EOSABS 0.1 08/23/2017 1101   BASOSABS 0.0 08/23/2017 1101     Chemistry      Component Value Date/Time   NA 138 08/23/2017 1101   K 4.8 08/23/2017 1101   CL 101 08/23/2017 1101   CO2 22 08/23/2017 1101   BUN 34 (H) 08/23/2017 1101   CREATININE 1.95 (H) 08/23/2017 1101   CREATININE 1.58 (H) 01/04/2017 1648      Component Value Date/Time   CALCIUM 8.8 08/23/2017 1101   ALKPHOS 33 (L) 08/24/2016 1519   AST 17 08/24/2016 1519   ALT 10 (L) 08/24/2016 1519   BILITOT 1.0 08/24/2016 1519       Radiological Studies: No results found.  Impression:  1.  History of bilateral pulmonary emboli and extensive proximal right lower extremity DVT with other ongoing risk factors predisposing towards recurrent thrombosis. Plan: Continue chronic anticoagulation Further deterioration in his renal function.  Creatinine now 2.  Estimated creatinine clearance 31 mL/min.  Already on dose reduced Eliquis. We discussed today that if there is any significant fall in his creatinine clearance below current value he would best be served by Coumadin anticoagulation.  He should have his renal function checked every few months.  2.  Stage III but borderline stage IV chronic renal insufficiency.  See discussion above.  3.  Essential hypertension  4.  Recurrent idiopathic low-grade fever with no other associated constitutional symptoms, no leukocytosis, and normal white count differential.  CC: Patient Care Team: Elby Showers, MD as PCP - General (Internal Medicine)   Murriel Hopper, MD, [place]  Hematology-Oncology/Internal Medicine     4/1/20194:46 PM

## 2017-08-30 NOTE — Patient Instructions (Signed)
Return visit as needed Continue to monitor your kidney function periodically Stay on current 2.5 mg twice daily Eliquis for now Call for any interim advice if you plan any surgical procedures

## 2017-08-31 ENCOUNTER — Telehealth: Payer: Self-pay | Admitting: Internal Medicine

## 2017-08-31 DIAGNOSIS — L03119 Cellulitis of unspecified part of limb: Secondary | ICD-10-CM

## 2017-08-31 DIAGNOSIS — R7989 Other specified abnormal findings of blood chemistry: Secondary | ICD-10-CM

## 2017-08-31 NOTE — Telephone Encounter (Signed)
He remains on anticoagulation by Dr. Beryle Beams. Hx PE. Has had apparent recurrent LE cellulitis with fever and chills. Would like for him to have ABI studies and see ID about this. Needs renal consult for CKD. Ultrasound ordered.

## 2017-09-01 NOTE — Progress Notes (Signed)
Referral has been faxed over to [facility].  Will wait for an appointment to be provided.

## 2017-09-02 ENCOUNTER — Encounter: Payer: Self-pay | Admitting: Internal Medicine

## 2017-09-02 ENCOUNTER — Other Ambulatory Visit: Payer: Self-pay | Admitting: Internal Medicine

## 2017-09-02 ENCOUNTER — Telehealth: Payer: Self-pay | Admitting: Internal Medicine

## 2017-09-02 NOTE — Telephone Encounter (Signed)
Note from vascular lab/insurance that cellultis is not a covered diagnosis. Use instead: leg pain and venous insuffiency

## 2017-09-06 ENCOUNTER — Other Ambulatory Visit: Payer: Self-pay | Admitting: Oncology

## 2017-09-06 DIAGNOSIS — I824Y1 Acute embolism and thrombosis of unspecified deep veins of right proximal lower extremity: Secondary | ICD-10-CM

## 2017-09-06 DIAGNOSIS — I2782 Chronic pulmonary embolism: Secondary | ICD-10-CM

## 2017-09-06 DIAGNOSIS — N189 Chronic kidney disease, unspecified: Secondary | ICD-10-CM

## 2017-09-07 ENCOUNTER — Other Ambulatory Visit: Payer: Self-pay

## 2017-09-07 MED ORDER — LEVOFLOXACIN 500 MG PO TABS: 500.0000 mg | ORAL_TABLET | Freq: Every day | ORAL | 0 refills | Status: DC

## 2017-09-14 ENCOUNTER — Encounter: Payer: Self-pay | Admitting: Internal Medicine

## 2017-09-14 DIAGNOSIS — M79606 Pain in leg, unspecified: Secondary | ICD-10-CM

## 2017-09-14 DIAGNOSIS — M79604 Pain in right leg: Secondary | ICD-10-CM

## 2017-09-14 DIAGNOSIS — M79605 Pain in left leg: Secondary | ICD-10-CM

## 2017-09-21 ENCOUNTER — Other Ambulatory Visit: Payer: Self-pay

## 2017-09-21 MED ORDER — ZOLPIDEM TARTRATE 10 MG PO TABS: 10.0000 mg | ORAL_TABLET | Freq: Every day | ORAL | 5 refills | Status: DC

## 2017-09-22 ENCOUNTER — Other Ambulatory Visit: Payer: Self-pay | Admitting: Internal Medicine

## 2017-09-22 DIAGNOSIS — M79604 Pain in right leg: Secondary | ICD-10-CM

## 2017-09-22 DIAGNOSIS — M79605 Pain in left leg: Principal | ICD-10-CM

## 2017-09-22 NOTE — Addendum Note (Signed)
Addended by: Mady Haagensen on: 09/22/2017 02:22 PM   Modules accepted: Orders

## 2017-09-23 ENCOUNTER — Encounter: Payer: Self-pay | Admitting: Internal Medicine

## 2017-09-23 ENCOUNTER — Ambulatory Visit: Payer: Medicare Other | Admitting: Internal Medicine

## 2017-09-23 VITALS — BP 110/58 | HR 68 | Temp 98.1°F | Wt 190.0 lb

## 2017-09-23 DIAGNOSIS — M79673 Pain in unspecified foot: Secondary | ICD-10-CM

## 2017-09-23 DIAGNOSIS — F410 Panic disorder [episodic paroxysmal anxiety] without agoraphobia: Secondary | ICD-10-CM

## 2017-09-23 DIAGNOSIS — L03119 Cellulitis of unspecified part of limb: Secondary | ICD-10-CM | POA: Diagnosis not present

## 2017-09-23 DIAGNOSIS — E119 Type 2 diabetes mellitus without complications: Secondary | ICD-10-CM | POA: Diagnosis not present

## 2017-09-23 DIAGNOSIS — R7302 Impaired glucose tolerance (oral): Secondary | ICD-10-CM | POA: Diagnosis not present

## 2017-09-23 DIAGNOSIS — N184 Chronic kidney disease, stage 4 (severe): Secondary | ICD-10-CM | POA: Diagnosis not present

## 2017-09-23 DIAGNOSIS — G4700 Insomnia, unspecified: Secondary | ICD-10-CM | POA: Insufficient documentation

## 2017-09-23 DIAGNOSIS — G8929 Other chronic pain: Secondary | ICD-10-CM | POA: Diagnosis not present

## 2017-09-23 DIAGNOSIS — Z86711 Personal history of pulmonary embolism: Secondary | ICD-10-CM

## 2017-09-23 DIAGNOSIS — M21961 Unspecified acquired deformity of right lower leg: Secondary | ICD-10-CM | POA: Diagnosis not present

## 2017-09-23 DIAGNOSIS — F5101 Primary insomnia: Secondary | ICD-10-CM

## 2017-09-23 MED ORDER — DOXYCYCLINE HYCLATE 100 MG PO TABS: 100.0000 mg | ORAL_TABLET | Freq: Every day | ORAL | 0 refills | Status: DC

## 2017-09-23 MED ORDER — HYDROCODONE-ACETAMINOPHEN 10-325 MG PO TABS: 1.0000 | ORAL_TABLET | Freq: Four times a day (QID) | ORAL | 0 refills | Status: DC

## 2017-09-23 MED ORDER — CLONAZEPAM 0.5 MG PO TABS: ORAL_TABLET | ORAL | 0 refills | Status: DC

## 2017-09-23 NOTE — Patient Instructions (Signed)
Klonopin 1 or 2 tablets at onset of panic disorder.  Take doxycycline 100 mg daily when traveling.  Hold Levaquin and reserve for cellulitis should it recur.  Follow-up here in a month.  Chronic pain medication refilled.  Needs fasting lab work in the near future.  Please have ABI studies done

## 2017-09-23 NOTE — Progress Notes (Signed)
Subjective:    Patient ID: Roberto Reid, male    DOB: [birth year], 84 y.o.   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  It is a brutal trip for him.  Recently he has begun to have some issues with panic disorder.  He is not sure what is causing it.  One occurred while he was in [place] spending the night.  His wife had to stay on the phone with him for some time until he could calm down.  It may be that he feels out of control most certain circumstances.  We discussed this at length today.  I have prescribed Klonopin for him to take an onset of panic feelings.  Flying is hard for him.  I think he has had more bouts of cellulitis of his leg recently.  I think he probably bumps his leg during the air travel and he probably has chronically damaged lymphatics from an initial bout of cellulitis after an insect bite of each many years ago.  He travels with Levaquin which he can take for amount of cellulitis.  He feels that he has had more issues with that recently.  I have asked him to have ABI studies to check on peripheral vascular disease.  Dr. Beryle Beams has followed up with him regarding DVT and PE and decreased his dose of anticoagulant in half.  He had an appointment recently to be seen at [facility] regarding chronic kidney disease but he says is really not convenient for him to go at this point in time.  They reviewed his records and recommended he not be taking metformin with chronic kidney disease.  We talked about this today.  I would like for him to keep that appointment but he is not willing to go at this point time.  With regard to chronic pain management he continues on same medication and says his control of pain due to the foot abnormality is stable and adequate.    Review of Systems     Objective:   Physical Exam Not examined today but we spent 25 minutes speaking about these issues.  A  month ago his creatinine had increased from 1.61 to 1.95.  This will need to be followed.  He has an appointment for pain management follow-up here in late May.  He agrees to go for ABIs.       Assessment & Plan:  Recurrent cellulitis of lower extremity  History of DVT and PE now on chronic anticoagulant therapy and followed by Dr. Beryle Beams  Panic disorder-start Klonopin 0.5 to 1.0 mg at onset of panic disorder  History of insomnia  Chronic pain due to foot  Chronic kidney disease  History of impaired glucose tolerance and hemoglobin A1c was 5.3 in August 2018  History of low testosterone  Essential hypertension  Erectile dysfunction  History of GE reflux treated with Nexium  Patient encouraged to keep appointment for ABIs.  He wants to hold off on referral to nephrologist.  He will follow-up here in about a month for chronic pain management visit.  He should have some fasting lab studies in the near future.  Urine drug screen done today in follow-up of chronic pain management.  Pain medication refilled today.  Regarding chronic cellulitis of suggested that he take doxycycline 100 mg daily when he is traveling as this may prevent recurrence of cellulitis.  He  can hold Levaquin as a secondary medication to take if doxycycline does not prevent cellulitis.

## 2017-09-26 LAB — PAIN MGMT, PROFILE 8 W/CONF, U
6 Acetylmorphine: NEGATIVE ng/mL (ref <10)
Alcohol Metabolites: POSITIVE ng/mL — AB (ref <500)
Amphetamines: NEGATIVE ng/mL (ref <500)
BENZODIAZEPINES: NEGATIVE ng/mL (ref <100)
Buprenorphine, Urine: NEGATIVE ng/mL (ref <5)
COCAINE METABOLITE: NEGATIVE ng/mL (ref <150)
CREATININE: 155 mg/dL
Codeine: NEGATIVE ng/mL (ref <50)
ETHYL GLUCURONIDE (ETG): 34300 ng/mL — AB (ref <500)
Ethyl Sulfate (ETS): 6808 ng/mL — ABNORMAL HIGH (ref <100)
HYDROMORPHONE: 2418 ng/mL — AB (ref <50)
Hydrocodone: 1877 ng/mL — ABNORMAL HIGH (ref <50)
MARIJUANA METABOLITE: NEGATIVE ng/mL (ref <20)
MDMA: NEGATIVE ng/mL (ref <500)
Morphine: NEGATIVE ng/mL (ref <50)
Norhydrocodone: 3075 ng/mL — ABNORMAL HIGH (ref <50)
OXIDANT: NEGATIVE ug/mL (ref <200)
OXYCODONE: NEGATIVE ng/mL (ref <100)
Opiates: POSITIVE ng/mL — AB (ref <100)
PH: 6.76 (ref 4.5–9.0)

## 2017-09-27 ENCOUNTER — Ambulatory Visit (HOSPITAL_COMMUNITY)
Admission: RE | Admit: 2017-09-27 | Discharge: 2017-09-27 | Disposition: A | Discharge status: Home / Self Care | Payer: Medicare Other | Source: Ambulatory Visit | Attending: Surgery | Admitting: Surgery

## 2017-09-27 DIAGNOSIS — M79605 Pain in left leg: Secondary | ICD-10-CM | POA: Diagnosis not present

## 2017-09-27 DIAGNOSIS — M79604 Pain in right leg: Secondary | ICD-10-CM | POA: Diagnosis not present

## 2017-09-30 ENCOUNTER — Telehealth: Payer: Self-pay | Admitting: Internal Medicine

## 2017-09-30 NOTE — Telephone Encounter (Signed)
Faxed referral to [facility] on 4.2.19

## 2017-10-15 ENCOUNTER — Other Ambulatory Visit: Payer: Self-pay

## 2017-10-15 MED ORDER — ZOLPIDEM TARTRATE 10 MG PO TABS: 10.0000 mg | ORAL_TABLET | Freq: Every day | ORAL | 1 refills | Status: DC

## 2017-10-20 ENCOUNTER — Encounter: Payer: Self-pay | Admitting: Internal Medicine

## 2017-10-20 ENCOUNTER — Telehealth: Payer: Self-pay | Admitting: Internal Medicine

## 2017-10-20 NOTE — Telephone Encounter (Addendum)
Mr Roberto Reid and rescheduled appt.

## 2017-10-20 NOTE — Telephone Encounter (Signed)
LVM to CB and reschedule appointment for 10/21/17

## 2017-10-20 NOTE — Telephone Encounter (Signed)
He was here April 25 and had issues other than chronic pain to discuss. Was told to come back in a month for chronic pain management and was told he would need labs in the near future. Due to heavy schedule tomorrow, we are going to postpone this visit one month but will need fasting labs then including CBC, Cmet, AIC, lipid panel, TSH,Vitamin D level.

## 2017-10-21 ENCOUNTER — Ambulatory Visit: Payer: Medicare Other | Admitting: Internal Medicine

## 2017-10-21 ENCOUNTER — Other Ambulatory Visit: Payer: Self-pay

## 2017-10-21 MED ORDER — HYDROCODONE-ACETAMINOPHEN 10-325 MG PO TABS: 1.0000 | ORAL_TABLET | Freq: Four times a day (QID) | ORAL | 0 refills | Status: DC

## 2017-11-09 ENCOUNTER — Ambulatory Visit (INDEPENDENT_AMBULATORY_CARE_PROVIDER_SITE_OTHER): Payer: Medicare Other | Admitting: Internal Medicine

## 2017-11-09 VITALS — BP 128/64 | HR 74 | Temp 98.1°F | Ht 70.0 in | Wt 194.0 lb

## 2017-11-09 DIAGNOSIS — J22 Unspecified acute lower respiratory infection: Secondary | ICD-10-CM | POA: Diagnosis not present

## 2017-11-09 DIAGNOSIS — R0981 Nasal congestion: Secondary | ICD-10-CM | POA: Diagnosis not present

## 2017-11-09 MED ORDER — CEFTRIAXONE SODIUM 1 G IJ SOLR
1.0000 g | Freq: Once | INTRAMUSCULAR | Status: AC
  Administered 2017-11-09: 1 g via INTRAMUSCULAR

## 2017-11-09 MED ORDER — LEVOFLOXACIN 500 MG PO TABS: 500.0000 mg | ORAL_TABLET | Freq: Every day | ORAL | 0 refills | Status: DC

## 2017-11-09 MED ORDER — METHYLPREDNISOLONE ACETATE 80 MG/ML IJ SUSP
80.0000 mg | Freq: Once | INTRAMUSCULAR | Status: AC
  Administered 2017-11-09: 80 mg via INTRAMUSCULAR

## 2017-11-17 ENCOUNTER — Telehealth: Payer: Self-pay | Admitting: Internal Medicine

## 2017-11-17 MED ORDER — HYDROCODONE-ACETAMINOPHEN 10-325 MG PO TABS: 1.0000 | ORAL_TABLET | Freq: Four times a day (QID) | ORAL | 0 refills | Status: DC

## 2017-11-17 NOTE — Telephone Encounter (Signed)
Spoke with patient and advised that he can go directly to the pharmacy and pick up his medication as this has been escribed to his pharmacy.  Patient verbalized understanding.

## 2017-11-17 NOTE — Telephone Encounter (Signed)
Please call and tell him this has been e-scribed

## 2017-11-17 NOTE — Telephone Encounter (Addendum)
Patient calling to request a refill on his Norco.  States that he's leaving on vacation tomorrow.  And, he will run out of medication while he is gone.  So, he wants to pick up a Rx this afternoon.    E-scribed Norco 10/325 #124 one po q 6 hours for pain with no refill

## 2017-11-22 ENCOUNTER — Ambulatory Visit: Payer: Medicare Other | Admitting: Internal Medicine

## 2017-11-27 ENCOUNTER — Encounter: Payer: Self-pay | Admitting: Internal Medicine

## 2017-11-27 NOTE — Progress Notes (Signed)
   Subjective:    Patient ID: Roberto Reid, male    DOB: [birth year], 84 y.o.   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  Has come down with a respiratory infection.  Has malaise and fatigue and slight cough.  No fever or shaking chills.  He is a bit anxious because he has to leave town again soon.  Remains on chronic anticoagulation with history of pulmonary embolus    Review of Systems cough is slightly productive     Objective:   Physical Exam  Skin warm and dry.  Nodes none.  TMs are clear.  Pharynx slightly injected.  He sounds nasally congested when he speaks.  Neck is supple without adenopathy.  Chest clear to auscultation.      Assessment & Plan:  Acute lower respiratory infection  Plan: Levaquin 500 mg daily for 10 days.

## 2017-11-27 NOTE — Patient Instructions (Signed)
Levaquin 500 mg daily x 10 days. Rest and drink plenty of fluids.

## 2017-12-15 ENCOUNTER — Other Ambulatory Visit: Payer: Self-pay

## 2017-12-15 NOTE — Telephone Encounter (Signed)
Patient called sates he is going out of town on Friday and his [facility] will run out then. He would like to get a refill he said he can come pick up a script.

## 2017-12-15 NOTE — Telephone Encounter (Signed)
Refill once but must be seen for 3 month pain management follow up PE and CPE labs late July

## 2017-12-15 NOTE — Telephone Encounter (Signed)
Left detailed message.   

## 2017-12-16 ENCOUNTER — Encounter: Payer: Self-pay | Admitting: Internal Medicine

## 2017-12-16 ENCOUNTER — Telehealth: Payer: Self-pay | Admitting: Internal Medicine

## 2017-12-16 DIAGNOSIS — M2550 Pain in unspecified joint: Secondary | ICD-10-CM

## 2017-12-16 MED ORDER — HYDROCODONE-ACETAMINOPHEN 10-325 MG PO TABS: 1.0000 | ORAL_TABLET | Freq: Four times a day (QID) | ORAL | 0 refills | Status: DC

## 2017-12-16 NOTE — Telephone Encounter (Signed)
Patient picked up his Norco Rx and took it to [facility].  It stated that he could not fill it until 7/19.  Patient was leaving for [facility] on 7/19 (unbeknownst to [place]).  Tiffany at [facility] called [place] but we couldn't do anything over the phone because Dr Renold Genta was seeing patients. Asked the patient if he could take it to a 24 hour pharmacy, he refused to take it anywhere other than [facility].  Patient's flight leaves at 11:00 a.m.on 7/19.    Dr. Renold Genta spoke with pharmacist at [facility] and she will allow Dr. Renold Genta to send electronically through [place].  I called patient and he did not answer the call.  Left detailed message on patient's voice mail to please tear up the written Rx and that Dr. Renold Genta was sending electronically to [facility] that he could go and pick it up this evening since he was leaving in the a.m.   Apologized for the inconvenience to him today.

## 2017-12-16 NOTE — Telephone Encounter (Signed)
Roberto Reid went a day early to get his Norco refilled as he is leaving for [facility] tomorrow. He was unhappy they would not fill it early. I have spoken with pharmacist and am sending this refill electronically and it can be filled today.

## 2017-12-21 ENCOUNTER — Ambulatory Visit (INDEPENDENT_AMBULATORY_CARE_PROVIDER_SITE_OTHER): Payer: Medicare Other | Admitting: Internal Medicine

## 2017-12-21 VITALS — BP 110/60 | HR 66 | Ht 70.0 in | Wt 178.0 lb

## 2017-12-21 DIAGNOSIS — M2141 Flat foot [pes planus] (acquired), right foot: Secondary | ICD-10-CM

## 2017-12-21 DIAGNOSIS — M2011 Hallux valgus (acquired), right foot: Secondary | ICD-10-CM

## 2017-12-21 DIAGNOSIS — G8929 Other chronic pain: Secondary | ICD-10-CM

## 2017-12-21 NOTE — Progress Notes (Signed)
   Subjective:    Patient ID: Roberto Reid, male    DOB: 02-06-1933, 84 y.o.   MRN: [id number]  HPI 84 year old Male for pain management follow up.  He remains on Norco 10/325 and says his pain in his right foot due to a foot deformity is fairly well controlled with current regimen of hydrocodone APAP 10/325 every 6 hours.  At last pain management visit in April he was having issues with anxiety.  This seems to have resolved.  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  He did have ABI studies done showing a normal study in the right lower extremity and mild left lower extremity arterial disease.  He does not want to pursue vascular consult at this time.  We have set him up for Medicare wellness exam and physical examination in mid August.           Review of Systems see above     Objective:   Physical Exam  He has a chronic  right foot deformity with hallux valgus great toe with crossover the top of the second toe.  He has seen Dr. 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  History of acquired pes planovalgus on the right.  He had a percutaneous tenotomy which helped for short period of time but then he had recurrent pain.      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  25 minutes spent with patient

## 2017-12-23 NOTE — Patient Instructions (Signed)
Norco 10/325 was refilled last week prior to his trip to [place].  He will return in August for physical examination.

## 2018-01-10 ENCOUNTER — Other Ambulatory Visit: Payer: Self-pay | Admitting: Internal Medicine

## 2018-01-10 DIAGNOSIS — E8881 Metabolic syndrome: Secondary | ICD-10-CM

## 2018-01-10 DIAGNOSIS — G8929 Other chronic pain: Secondary | ICD-10-CM

## 2018-01-10 DIAGNOSIS — Z8719 Personal history of other diseases of the digestive system: Secondary | ICD-10-CM

## 2018-01-10 DIAGNOSIS — H9193 Unspecified hearing loss, bilateral: Secondary | ICD-10-CM

## 2018-01-10 DIAGNOSIS — K219 Gastro-esophageal reflux disease without esophagitis: Secondary | ICD-10-CM

## 2018-01-10 DIAGNOSIS — N529 Male erectile dysfunction, unspecified: Secondary | ICD-10-CM

## 2018-01-10 DIAGNOSIS — E785 Hyperlipidemia, unspecified: Secondary | ICD-10-CM

## 2018-01-10 DIAGNOSIS — R7989 Other specified abnormal findings of blood chemistry: Secondary | ICD-10-CM

## 2018-01-10 DIAGNOSIS — M79671 Pain in right foot: Secondary | ICD-10-CM

## 2018-01-10 DIAGNOSIS — E119 Type 2 diabetes mellitus without complications: Secondary | ICD-10-CM

## 2018-01-10 DIAGNOSIS — Z87891 Personal history of nicotine dependence: Secondary | ICD-10-CM

## 2018-01-10 DIAGNOSIS — F5104 Psychophysiologic insomnia: Secondary | ICD-10-CM

## 2018-01-10 DIAGNOSIS — Z87442 Personal history of urinary calculi: Secondary | ICD-10-CM

## 2018-01-10 DIAGNOSIS — Z125 Encounter for screening for malignant neoplasm of prostate: Secondary | ICD-10-CM

## 2018-01-10 DIAGNOSIS — Z0000 Encounter for general adult medical examination without abnormal findings: Secondary | ICD-10-CM

## 2018-01-11 ENCOUNTER — Encounter: Payer: Self-pay | Admitting: Internal Medicine

## 2018-01-11 ENCOUNTER — Ambulatory Visit (INDEPENDENT_AMBULATORY_CARE_PROVIDER_SITE_OTHER): Payer: Medicare Other | Admitting: Internal Medicine

## 2018-01-11 VITALS — BP 130/60 | HR 68 | Ht 70.0 in | Wt 178.0 lb

## 2018-01-11 DIAGNOSIS — Z87442 Personal history of urinary calculi: Secondary | ICD-10-CM | POA: Diagnosis not present

## 2018-01-11 DIAGNOSIS — N529 Male erectile dysfunction, unspecified: Secondary | ICD-10-CM | POA: Diagnosis not present

## 2018-01-11 DIAGNOSIS — Z0000 Encounter for general adult medical examination without abnormal findings: Secondary | ICD-10-CM

## 2018-01-11 DIAGNOSIS — E119 Type 2 diabetes mellitus without complications: Secondary | ICD-10-CM | POA: Diagnosis not present

## 2018-01-11 DIAGNOSIS — R7989 Other specified abnormal findings of blood chemistry: Secondary | ICD-10-CM | POA: Diagnosis not present

## 2018-01-11 DIAGNOSIS — Z87891 Personal history of nicotine dependence: Secondary | ICD-10-CM

## 2018-01-11 DIAGNOSIS — E291 Testicular hypofunction: Secondary | ICD-10-CM | POA: Diagnosis not present

## 2018-01-11 DIAGNOSIS — H9193 Unspecified hearing loss, bilateral: Secondary | ICD-10-CM

## 2018-01-11 DIAGNOSIS — K219 Gastro-esophageal reflux disease without esophagitis: Secondary | ICD-10-CM

## 2018-01-11 DIAGNOSIS — N401 Enlarged prostate with lower urinary tract symptoms: Secondary | ICD-10-CM | POA: Diagnosis not present

## 2018-01-11 DIAGNOSIS — M79671 Pain in right foot: Secondary | ICD-10-CM | POA: Diagnosis not present

## 2018-01-11 DIAGNOSIS — E785 Hyperlipidemia, unspecified: Secondary | ICD-10-CM

## 2018-01-11 DIAGNOSIS — G8929 Other chronic pain: Secondary | ICD-10-CM

## 2018-01-11 DIAGNOSIS — Z125 Encounter for screening for malignant neoplasm of prostate: Secondary | ICD-10-CM

## 2018-01-11 DIAGNOSIS — Z8719 Personal history of other diseases of the digestive system: Secondary | ICD-10-CM

## 2018-01-11 DIAGNOSIS — N5201 Erectile dysfunction due to arterial insufficiency: Secondary | ICD-10-CM | POA: Diagnosis not present

## 2018-01-11 DIAGNOSIS — F5104 Psychophysiologic insomnia: Secondary | ICD-10-CM

## 2018-01-11 DIAGNOSIS — E8881 Metabolic syndrome: Secondary | ICD-10-CM

## 2018-01-11 LAB — POCT URINALYSIS DIPSTICK
APPEARANCE: NORMAL
Bilirubin, UA: NEGATIVE
Blood, UA: NEGATIVE
Glucose, UA: NEGATIVE
KETONES UA: NEGATIVE
Leukocytes, UA: NEGATIVE
NITRITE UA: NEGATIVE
ODOR: NORMAL
PH UA: 6.5 (ref 5.0–8.0)
PROTEIN UA: NEGATIVE
Spec Grav, UA: 1.015 (ref 1.010–1.025)
UROBILINOGEN UA: 0.2 U/dL

## 2018-01-11 NOTE — Progress Notes (Signed)
Subjective:    Patient ID: Roberto Reid, male    DOB: May 26, 1933, 84 y.o.   MRN: [id number]  HPI  84 year old Male for health maintenance, Medicare wellness and evaluation of medical issues.  Has lost 22 pounds since 2017.  Says he does not get quite as hungry as he used to.  He has a history of essential hypertension, controlled type 2 diabetes mellitus, low testosterone, erectile dysfunction.  History of left knee replacement July 2013.  History of recurrent cellulitis right leg related to an apparent spider bite 2008.  His right toe crosses over second toe.  He says he has a broken pin in that foot that did not heal well properly.  He has to take chronic pain medication for that reason.  He went to see Dr. Ouida Sills in [place] regarding surgery on this foot and Dr. Ouida Sills did not think it would be wise to operate on it.  He has right second metatarsalgia secondary to Morton's foot.  He has old Lisfranc fracture subluxation with instability of first and second tarsometatarsal joints causing chronic pain.  He also went to [facility] to see Dr. Para March regarding his foot and had a procedure done that initially seemed to help but did not give long-term pain relief.  Fractured ankle 1953 secondary to football injury.  Fractured ribs in the remote past  Had left lower lobe pneumonia in 2017 and subsequently developed significant shortness of breath.  Was hospitalized with shortness of breath and was found to have pulmonary embolus treated with Eliquis.  Follow-up with Dr. Beryle Beams.  History of GE reflux treated with Nexium.  History of hyperlipidemia treated with Lipitor.  Issues with chronic insomnia treated with Ambien and Seroquel.  Anxiety treated with as needed Klonopin.  Uses AndroGel pump for low testosterone.  Hypertension treated with losartan.  History of cluster headaches seen by Dr. Erling Cruz 2001  History of Barrett's esophagus and adenomatous colon polyp in 2008.  Right kidney stones  2007 in 1997.  Surgery to repair fractured arm 1942.  Kidney stone removed 1997.  Reported history of uric acid stones 2007 treated by Dr. Sandrea Matte.  He has been evaluated in the past by Dr. Lia Foyer, cardiologist.  Cardiolite study 2008 was negative.  History of hearing loss.  Hearing test in 2010 revealed mild to moderate high-frequency hearing loss bilaterally.  History of dyshidrotic hand eczema.  Allergy skin testing done 2010 showed positive test to cats and some moles.  Minimal reactivity to dog.  Spirometry was normal.  History of GE reflux and osteoarthritis.  He had cataract extraction both eyes November 2010.  Left knee surgery done by Dr. Onnie Graham for torn cartilage November 2005.  Hospitalized in [place] September 2011 with right leg cellulitis and treated with IV antibiotics.  Social history: He has been married twice.  He is a non-smoker but formally smoked and quit in 1990 having smoked a pack a day for 32 years.  Social alcohol consumption.  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  In the Spring 2016 while on a trip to [place] he suffered a fall down some marble steps in [place] after slipping on them.  He suffered a right quadriceps tendon rupture of the right knee torn medial meniscus which was repaired by Dr. Ronnie Derby in 2016.  He had considerable rehab and took  some time to get over that injury.  Review of Systems chronic right foot pain which requires chronic narcotic pain medication.  He went to the chronic pain management clinic.  They wanted him to be evaluated for sleep apnea and he did not want to do that.  He prefers that I write his chronic pain medications.     Objective:   Physical Exam  Constitutional: He is oriented to person, place, and time. He appears well-developed and well-nourished.  HENT:  Head: Normocephalic and atraumatic.  Right Ear: External  ear normal.  Left Ear: External ear normal.  Mouth/Throat: Oropharynx is clear and moist. No oropharyngeal exudate.  Eyes: Pupils are equal, round, and reactive to light. EOM are normal. Right eye exhibits no discharge. Left eye exhibits no discharge.  Neck: Normal range of motion. Neck supple. No JVD present. No thyromegaly present.  Cardiovascular: Normal rate, regular rhythm, normal heart sounds and intact distal pulses.  No murmur heard. Pulmonary/Chest: Effort normal and breath sounds normal. No stridor. No respiratory distress. He has no wheezes.  Abdominal: Soft. He exhibits no distension and no mass. There is no tenderness. There is no guarding.  Genitourinary: Prostate normal.  Musculoskeletal:  Chronic right foot deformity.  Great toe crosses over second toe.  Lymphadenopathy:    He has no cervical adenopathy.  Neurological: He is alert and oriented to person, place, and time.  Skin: Skin is warm and dry.  Psychiatric: He has a normal mood and affect. His behavior is normal. Judgment and thought content normal.  Vitals reviewed.         Assessment & Plan:  Chronic right foot pain requiring narcotic pain medication  Chronic insomnia  Bilateral hearing loss  Impaired glucose tolerance-hemoglobin A1c 5.7%  Hyperlipidemia  Metabolic syndrome  Obesity  Low testosterone treated with testosterone replacement  GE reflux  History of Barrett's esophagus  History of adenomatous colon polyps  History of anxiety  Erectile dysfunction  History of kidney stones  History of pulmonary embolus on chronic Eliquis therapy followed by Dr. Beryle Beams.  Chronic kidney disease-have tried to refer him to [facility] but he does not seem to want to pursue consultation.  Also tried to get him to have [facility] due to issues with recurrent cellulitis in his leg.  Have ordered renal ultrasound more than wants but he has not been.  In 2017  he had MRI of abdomen with and without contrast and was found to have small bilateral renal cysts.  Had cholelithiasis.  Remote history of kidney stones.  Likely chronic kidney disease is multifactorial.  Plan: Continue with chronic pain management through this office.  He says he is getting adequate relief with current regimen.  He does not abuse the medications.  Continue current medications.  His creatinine is 1.62 and 4 months ago was 1.55 He needs follow-up in 3 months and is on every 3 month recall for chronic pain medication evaluation.  Subjective:   Patient presents for Medicare Annual/Subsequent preventive examination.  Review Past Medical/Family/Social: See above  Risk Factors  Current exercise habits: Plays golf and walks Dietary issues discussed: Low-fat low carbohydrate  Cardiac risk factors: Hyperlipidemia, hypertension, impaired glucose tolerance  Depression Screen  (Note: if answer to either of the following is "Yes", a more complete depression screening is indicated)   Over the past two weeks, have you felt down, depressed or hopeless? No  Over the past two weeks, have you felt  little interest or pleasure in doing things? No Have you lost interest or pleasure in daily life? No Do you often feel hopeless? No Do you cry easily over simple problems? No   Activities of Daily Living  In your present state of health, do you have any difficulty performing the following activities?:   Driving? No  Managing money? No  Feeding yourself? No  Getting from bed to chair? No  Climbing a flight of stairs? No  Preparing food and eating?: No  Bathing or showering? No  Getting dressed: No  Getting to the toilet? No  Using the toilet:No  Moving around from place to place: No  In the past year have you fallen or had a near fall?:No  Are you sexually active? No  Do you have more than one partner? No   Hearing Difficulties: No  Do you often ask people to speak up or repeat  themselves?  Yes Do you experience ringing or noises in your ears? No  Do you have difficulty understanding soft or whispered voices?  Yes Do you feel that you have a problem with memory? No Do you often misplace items?  Yes   Home Safety:  Do you have a smoke alarm at your residence? Yes Do you have grab bars in the bathroom?  No Do you have throw rugs in your house?  No   Cognitive Testing  Alert? Yes Normal Appearance?Yes  Oriented to person? Yes Place? Yes  Time? Yes  Recall of three objects? Yes  Can perform simple calculations? Yes  Displays appropriate judgment?Yes  Can read the correct time from a watch face?Yes   List the Names of Other Physician/Practitioners you currently use:  See referral list for the physicians patient is currently seeing.     Review of Systems: See above Objective:     General appearance: Appears stated age and mildly obese  Head: Normocephalic, without obvious abnormality, atraumatic  Eyes: conj clear, EOMi PEERLA  Ears: normal TM's and external ear canals both ears  Nose: Nares normal. Septum midline. Mucosa normal. No drainage or sinus tenderness.  Throat: lips, mucosa, and tongue normal; teeth and gums normal  Neck: no adenopathy, no carotid bruit, no JVD, supple, symmetrical, trachea midline and thyroid not enlarged, symmetric, no tenderness/mass/nodules  No CVA tenderness.  Lungs: clear to auscultation bilaterally  Breasts: normal appearance, no masses or tenderness Heart: regular rate and rhythm, S1, S2 normal, no murmur, click, rub or gallop  Abdomen: soft, non-tender; bowel sounds normal; no masses, no organomegaly  Musculoskeletal: Chronic right foot deformity.   Skin: Skin color, texture, turgor normal. No rashes or lesions  Lymph nodes: Cervical, supraclavicular, and axillary nodes normal.  Neurologic: CN 2 -12 Normal, Normal symmetric reflexes. Normal coordination and gait  Psych: Alert & Oriented x 3, Mood appear stable.     Assessment:    Annual wellness medicare exam   Plan:    During the course of the visit the patient was educated and counseled about appropriate screening and preventive services including:   Annual flu vaccine     Patient Instructions (the written plan) was given to the patient.  Medicare Attestation  I have personally reviewed:  The patient's medical and social history  Their use of alcohol, tobacco or illicit drugs  Their current medications and supplements  The patient's functional ability including ADLs,fall risks, home safety risks, cognitive, and hearing and visual impairment  Diet and physical activities  Evidence for depression or mood disorders  The patient's weight, height, BMI, and visual acuity have been recorded in the chart. I have made referrals, counseling, and provided education to the patient based on review of the above and I have provided the patient with a written personalized care plan for preventive services.

## 2018-01-12 LAB — LIPID PANEL
CHOL/HDL RATIO: 4 (calc) (ref <5.0)
CHOLESTEROL: 182 mg/dL (ref <200)
HDL: 46 mg/dL (ref >40)
LDL CHOLESTEROL (CALC): 114 mg/dL — AB
NON-HDL CHOLESTEROL (CALC): 136 mg/dL — AB (ref <130)
Triglycerides: 117 mg/dL (ref <150)

## 2018-01-12 LAB — CBC WITH DIFFERENTIAL/PLATELET
BASOS PCT: 0.5 %
Basophils Absolute: 28 cells/uL (ref 0–200)
Eosinophils Absolute: 123 cells/uL (ref 15–500)
Eosinophils Relative: 2.2 %
HCT: 39.2 % (ref 38.5–50.0)
Hemoglobin: 13.7 g/dL (ref 13.2–17.1)
Lymphs Abs: 1198 cells/uL (ref 850–3900)
MCH: 32.7 pg (ref 27.0–33.0)
MCHC: 34.9 g/dL (ref 32.0–36.0)
MCV: 93.6 fL (ref 80.0–100.0)
MONOS PCT: 6.6 %
MPV: 10.7 fL (ref 7.5–12.5)
Neutro Abs: 3881 cells/uL (ref 1500–7800)
Neutrophils Relative %: 69.3 %
PLATELETS: 197 10*3/uL (ref 140–400)
RBC: 4.19 10*6/uL — AB (ref 4.20–5.80)
RDW: 12.8 % (ref 11.0–15.0)
TOTAL LYMPHOCYTE: 21.4 %
WBC: 5.6 10*3/uL (ref 3.8–10.8)
WBCMIX: 370 {cells}/uL (ref 200–950)

## 2018-01-12 LAB — HEMOGLOBIN A1C
HEMOGLOBIN A1C: 5.7 %{Hb} — AB (ref <5.7)
Mean Plasma Glucose: 117 (calc)
eAG (mmol/L): 6.5 (calc)

## 2018-01-12 LAB — COMPLETE METABOLIC PANEL WITH GFR
AG Ratio: 1.8 (calc) (ref 1.0–2.5)
ALKALINE PHOSPHATASE (APISO): 34 U/L — AB (ref 40–115)
ALT: 9 U/L (ref 9–46)
AST: 11 U/L (ref 10–35)
Albumin: 4.2 g/dL (ref 3.6–5.1)
BUN/Creatinine Ratio: 23 (calc) — ABNORMAL HIGH (ref 6–22)
BUN: 38 mg/dL — ABNORMAL HIGH (ref 7–25)
CO2: 26 mmol/L (ref 20–32)
CREATININE: 1.62 mg/dL — AB (ref 0.70–1.11)
Calcium: 8.9 mg/dL (ref 8.6–10.3)
Chloride: 105 mmol/L (ref 98–110)
GFR, Est African American: 45 mL/min/{1.73_m2} — ABNORMAL LOW (ref >=60)
GFR, Est Non African American: 39 mL/min/{1.73_m2} — ABNORMAL LOW (ref >=60)
GLOBULIN: 2.4 g/dL (ref 1.9–3.7)
GLUCOSE: 123 mg/dL — AB (ref 65–99)
Potassium: 4.5 mmol/L (ref 3.5–5.3)
SODIUM: 140 mmol/L (ref 135–146)
Total Bilirubin: 1.3 mg/dL — ABNORMAL HIGH (ref 0.2–1.2)
Total Protein: 6.6 g/dL (ref 6.1–8.1)

## 2018-01-12 LAB — MICROALBUMIN / CREATININE URINE RATIO
CREATININE, URINE: 134 mg/dL (ref 20–320)
Microalb Creat Ratio: 3 mcg/mg creat (ref <30)
Microalb, Ur: 0.4 mg/dL

## 2018-01-12 LAB — PSA: PSA: 0.5 ng/mL (ref <=4.0)

## 2018-01-17 ENCOUNTER — Other Ambulatory Visit: Payer: Self-pay

## 2018-01-17 MED ORDER — HYDROCODONE-ACETAMINOPHEN 10-325 MG PO TABS: 1.0000 | ORAL_TABLET | Freq: Four times a day (QID) | ORAL | 0 refills | Status: DC

## 2018-01-17 NOTE — Telephone Encounter (Signed)
Patient would like to pick up prescription today at 2:45pm.  Last refill 12/17/17.

## 2018-01-29 NOTE — Patient Instructions (Signed)
It was a pleasure to see you today.  Return in 3 months.  We need to follow-up with chronic kidney disease at that time.  We need to discuss renal ultrasound and ABI studies.  Continue chronic pain medication as prescribed.  Continue other medications as prescribed.

## 2018-02-07 ENCOUNTER — Other Ambulatory Visit: Payer: Self-pay

## 2018-02-07 MED ORDER — QUETIAPINE FUMARATE 50 MG PO TABS: 50.0000 mg | ORAL_TABLET | Freq: Every day | ORAL | 5 refills | Status: DC

## 2018-02-11 ENCOUNTER — Ambulatory Visit
Admission: RE | Admit: 2018-02-11 | Discharge: 2018-02-11 | Disposition: A | Discharge status: Home / Self Care | Payer: Medicare Other | Source: Ambulatory Visit | Attending: Internal Medicine | Admitting: Internal Medicine

## 2018-02-11 ENCOUNTER — Encounter: Payer: Self-pay | Admitting: Internal Medicine

## 2018-02-11 ENCOUNTER — Ambulatory Visit (INDEPENDENT_AMBULATORY_CARE_PROVIDER_SITE_OTHER): Payer: Medicare Other | Admitting: Internal Medicine

## 2018-02-11 VITALS — BP 110/72 | HR 76 | Temp 98.2°F | Ht 70.0 in | Wt 179.0 lb

## 2018-02-11 DIAGNOSIS — Z87442 Personal history of urinary calculi: Secondary | ICD-10-CM

## 2018-02-11 DIAGNOSIS — R350 Frequency of micturition: Secondary | ICD-10-CM

## 2018-02-11 DIAGNOSIS — R8290 Unspecified abnormal findings in urine: Secondary | ICD-10-CM | POA: Diagnosis not present

## 2018-02-11 DIAGNOSIS — R109 Unspecified abdominal pain: Secondary | ICD-10-CM | POA: Diagnosis not present

## 2018-02-11 DIAGNOSIS — M545 Low back pain: Secondary | ICD-10-CM | POA: Diagnosis not present

## 2018-02-11 LAB — CBC WITH DIFFERENTIAL/PLATELET
BASOS ABS: 37 {cells}/uL (ref 0–200)
Basophils Relative: 0.6 %
EOS ABS: 112 {cells}/uL (ref 15–500)
Eosinophils Relative: 1.8 %
HCT: 39.1 % (ref 38.5–50.0)
HEMOGLOBIN: 13.5 g/dL (ref 13.2–17.1)
Lymphs Abs: 1680 cells/uL (ref 850–3900)
MCH: 33.1 pg — AB (ref 27.0–33.0)
MCHC: 34.5 g/dL (ref 32.0–36.0)
MCV: 95.8 fL (ref 80.0–100.0)
MONOS PCT: 6.7 %
MPV: 10 fL (ref 7.5–12.5)
Neutro Abs: 3956 cells/uL (ref 1500–7800)
Neutrophils Relative %: 63.8 %
PLATELETS: 213 10*3/uL (ref 140–400)
RBC: 4.08 10*6/uL — ABNORMAL LOW (ref 4.20–5.80)
RDW: 13.1 % (ref 11.0–15.0)
Total Lymphocyte: 27.1 %
WBC mixed population: 415 cells/uL (ref 200–950)
WBC: 6.2 10*3/uL (ref 3.8–10.8)

## 2018-02-11 LAB — POCT URINALYSIS DIPSTICK
Bilirubin, UA: NEGATIVE
GLUCOSE UA: NEGATIVE
Ketones, UA: NEGATIVE
LEUKOCYTES UA: NEGATIVE
Nitrite, UA: NEGATIVE
PH UA: 5 (ref 5.0–8.0)
Protein, UA: NEGATIVE
Spec Grav, UA: 1.015 (ref 1.010–1.025)
Urobilinogen, UA: 0.2 E.U./dL

## 2018-02-11 LAB — BASIC METABOLIC PANEL
BUN / CREAT RATIO: 15 (calc) (ref 6–22)
BUN: 25 mg/dL (ref 7–25)
CHLORIDE: 103 mmol/L (ref 98–110)
CO2: 26 mmol/L (ref 20–32)
Calcium: 9 mg/dL (ref 8.6–10.3)
Creat: 1.66 mg/dL — ABNORMAL HIGH (ref 0.70–1.11)
Glucose, Bld: 102 mg/dL — ABNORMAL HIGH (ref 65–99)
POTASSIUM: 4.1 mmol/L (ref 3.5–5.3)
Sodium: 138 mmol/L (ref 135–146)

## 2018-02-11 MED ORDER — DOXYCYCLINE HYCLATE 100 MG PO TABS: 100.0000 mg | ORAL_TABLET | Freq: Two times a day (BID) | ORAL | 0 refills | Status: DC

## 2018-02-11 NOTE — Addendum Note (Signed)
Addended by: Elby Showers on: 02/11/2018 05:36 PM   Modules accepted: Orders

## 2018-02-11 NOTE — Progress Notes (Addendum)
   Subjective:    Patient ID: Roberto Reid, male    DOB: 08/07/1932, 84 y.o.   MRN: [id number]  HPI 84 year old Male with history of kidney stones x 2 requiring interventions by Dr. Jeffie Pollock, last being about 12 years ago flew to [place], [place] on Tuesday.  After arrival, he began to notice some left flank pain.  This persisted throughout the evening.  Pain increased after midnight.  He had some hydrocodone APAP which he took to ease the pain.  He was urinating frequently about once an hour.  He also started himself on an antibiotic likely doxycycline once a day.  He did not see any frank hematuria.  He felt nauseated but had no vomiting.  He thought perhaps he had strained a muscle while working out the previous day.  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  Last night he went to [place] to [employer].  He is still having left flank pain although it is not as severe as it was on Tuesday, September 10.  He is on chronic pain medication for a foot condition.     Review of Systems see above     Objective:   Physical Exam He has tenderness below his left lateral rib cage area.  Prostate is normal.  Urine dipstick shows only trace nonhemolyzed occult blood.  Sent for culture and microscopic urinalysis.       Assessment & Plan:  I am suspicious he has another renal stone.  Is possible he has passed the stone.  He has a history of chronic kidney disease.  He is going to get a stat ultrasound of the kidneys today.  B met was drawn along with CBC.   30 minutes spent with patient. He has no hydronephrosis on ultrasound. Culture pending. Take Doxycycline 100 mg bid until follow up this coming Monday. Creatinine stable.

## 2018-02-11 NOTE — Patient Instructions (Addendum)
To have stat ultrasound of the kidneys.  CBC and B- met are pending.  Addendum: No stone or hydronephrosis. Take Doxycycline bid and return Monday. Creatinine stable.

## 2018-02-12 LAB — URINE CULTURE
MICRO NUMBER:: 91100218
RESULT: NO GROWTH
SPECIMEN QUALITY: ADEQUATE

## 2018-02-12 LAB — URINALYSIS, MICROSCOPIC ONLY
Bacteria, UA: NONE SEEN /HPF
HYALINE CAST: NONE SEEN /LPF
Squamous Epithelial / LPF: NONE SEEN /HPF (ref <=5)
WBC UA: NONE SEEN /HPF (ref 0–5)

## 2018-02-14 ENCOUNTER — Ambulatory Visit (INDEPENDENT_AMBULATORY_CARE_PROVIDER_SITE_OTHER): Payer: Medicare Other | Admitting: Internal Medicine

## 2018-02-14 ENCOUNTER — Ambulatory Visit
Admission: RE | Admit: 2018-02-14 | Discharge: 2018-02-14 | Disposition: A | Discharge status: Home / Self Care | Payer: Medicare Other | Source: Ambulatory Visit | Attending: Internal Medicine | Admitting: Internal Medicine

## 2018-02-14 ENCOUNTER — Encounter: Payer: Self-pay | Admitting: Internal Medicine

## 2018-02-14 VITALS — BP 140/70 | HR 80 | Temp 98.0°F | Ht 70.0 in | Wt 183.0 lb

## 2018-02-14 DIAGNOSIS — R109 Unspecified abdominal pain: Secondary | ICD-10-CM | POA: Diagnosis not present

## 2018-02-14 DIAGNOSIS — G8929 Other chronic pain: Secondary | ICD-10-CM

## 2018-02-14 DIAGNOSIS — R0781 Pleurodynia: Secondary | ICD-10-CM | POA: Diagnosis not present

## 2018-02-14 DIAGNOSIS — K5903 Drug induced constipation: Secondary | ICD-10-CM | POA: Diagnosis not present

## 2018-02-14 DIAGNOSIS — M79671 Pain in right foot: Secondary | ICD-10-CM | POA: Diagnosis not present

## 2018-02-14 DIAGNOSIS — R079 Chest pain, unspecified: Secondary | ICD-10-CM | POA: Diagnosis not present

## 2018-02-14 LAB — POCT URINALYSIS DIPSTICK
Appearance: NORMAL
Bilirubin, UA: NEGATIVE
GLUCOSE UA: NEGATIVE
KETONES UA: NEGATIVE
Leukocytes, UA: NEGATIVE
Nitrite, UA: NEGATIVE
Odor: NORMAL
Protein, UA: NEGATIVE
SPEC GRAV UA: 1.015 (ref 1.010–1.025)
Urobilinogen, UA: 0.2 E.U./dL
pH, UA: 6 (ref 5.0–8.0)

## 2018-02-14 MED ORDER — LUBIPROSTONE 24 MCG PO CAPS: 24.0000 ug | ORAL_CAPSULE | Freq: Two times a day (BID) | ORAL | 0 refills | Status: DC

## 2018-02-14 MED ORDER — HYDROCODONE-ACETAMINOPHEN 10-325 MG PO TABS: 1.0000 | ORAL_TABLET | Freq: Four times a day (QID) | ORAL | 0 refills | Status: DC

## 2018-02-14 MED ORDER — PREDNISONE 10 MG PO TABS: ORAL_TABLET | ORAL | 0 refills | Status: DC

## 2018-02-14 NOTE — Progress Notes (Signed)
   Subjective:    Patient ID: Roberto Reid, male    DOB: 1933/03/24, 84 y.o.   MRN: [id number]  HPI He was here Friday, September 13 complaining of left flank pain that had started on a trip to [place] on Tuesday, September 10.  I thought he might have a kidney stone.  Renal ultrasound was performed but was negative for stone or obstruction.  He does have chronic kidney disease with elevated serum creatinine likely due to hypertension for many years.  He has been reluctant to see nephrologist.  He does have a prior history of kidney stones and is seen Dr. Jeffie Pollock in the past and had 2 surgical procedures.  He has had urinary frequency with flank pain earlier in the week.  However urinalysis was unremarkable.  He was treated with doxycycline in case this was prostatitis.  Apparently this is made him nauseated.  He tried to play golf over the weekend and only played 5 holes before left flank pain became very severe.  He has chronic right foot pain and is prescribed chronic opioids for that.  He is compliant with his medications.  Recently he has been extremely constipated.  Apparently this is gone on for years but worsened after he started using chronic opioids for pain control of right foot pain.  Says he has to strain very hard at the stool and sometimes has rectal bleeding as a result.  Uses fleets enema.  He brings this up for the first time today.  He is going out of town to [place] for a wedding later this week.  Review of Systems complaint of nausea     Objective:   Physical Exam  He has point tenderness along the lower left rib cage.  X-ray of ribs shows no evidence of fracture or pathological lesion.  Urine dipstick is normal.  Rectal exam was not done.      Assessment & Plan:  Left rib cage pain- he has asked that we refill his opioid medication one day early because he is going to [place] and he was provided with prescription for that.  Heat for ice to be applied to that area.   We will try a short taper of prednisone to see if he can get relief going from 50 mg to 0 mg over 5 days.  Chronic constipation aggravated by opioid use.  Trial of Amitiza 24 mcg twice daily.  However we have learned this requires prior authorization.  If we are unable to get Amitiza approved he will need to see gastroenterologist regarding consult for opioid induced constipation  History of kidney stones-ultrasound was negative for stone/obstruction  Chronic right foot pain requiring opioids to control pain.  Surgery has been previously performed  and apparently foot deformity is not amenable to further surgery.  History of pulmonary emboli-treated with low-dose Eliquis  25 minutes spent with patient

## 2018-02-14 NOTE — Patient Instructions (Signed)
Chronic pain medication refilled at patient request one day early due to trip to [place].  Trial of Amitiza 24 mcg twice daily.  If this cannot be approved referral to gastroenterologist for consult regarding opioid-induced constipation.  Short course of prednisone for left flank pain thought to be nail musculoskeletal pain.  Chronic pain medication helps just a bit for this rib cage pain.  Rib x-ray was negative.

## 2018-02-16 ENCOUNTER — Telehealth: Payer: Self-pay

## 2018-02-16 DIAGNOSIS — R110 Nausea: Secondary | ICD-10-CM

## 2018-02-16 MED ORDER — ONDANSETRON HCL 4 MG PO TABS: 4.0000 mg | ORAL_TABLET | Freq: Three times a day (TID) | ORAL | 0 refills | Status: DC | PRN

## 2018-02-16 NOTE — Telephone Encounter (Signed)
Patient called requesting something for nausea. Zofran was sent per Dr. Renold Genta.

## 2018-03-08 ENCOUNTER — Ambulatory Visit (INDEPENDENT_AMBULATORY_CARE_PROVIDER_SITE_OTHER): Payer: Medicare Other | Admitting: Internal Medicine

## 2018-03-08 ENCOUNTER — Other Ambulatory Visit: Payer: Self-pay | Admitting: Internal Medicine

## 2018-03-08 ENCOUNTER — Other Ambulatory Visit: Payer: Self-pay | Admitting: Oncology

## 2018-03-08 DIAGNOSIS — I824Y1 Acute embolism and thrombosis of unspecified deep veins of right proximal lower extremity: Secondary | ICD-10-CM

## 2018-03-08 DIAGNOSIS — I2782 Chronic pulmonary embolism: Secondary | ICD-10-CM

## 2018-03-08 DIAGNOSIS — N189 Chronic kidney disease, unspecified: Secondary | ICD-10-CM

## 2018-03-08 DIAGNOSIS — Z23 Encounter for immunization: Secondary | ICD-10-CM | POA: Diagnosis not present

## 2018-03-08 NOTE — Telephone Encounter (Signed)
Glenda - please have patient get his Eliquis filled by his primary care MD Dr Renold Genta.

## 2018-03-08 NOTE — Progress Notes (Signed)
Flu vaccine given by CMA 

## 2018-03-08 NOTE — Patient Instructions (Signed)
Patient received a flu vaccine IM L deltoid, AV, CMA  

## 2018-03-09 ENCOUNTER — Other Ambulatory Visit: Payer: Self-pay

## 2018-03-09 MED ORDER — QUETIAPINE FUMARATE 50 MG PO TABS: 50.0000 mg | ORAL_TABLET | Freq: Every day | ORAL | 5 refills | Status: DC

## 2018-03-09 NOTE — Telephone Encounter (Signed)
Message left on self identified voicemail to call his PCP for Eliquis refills per Dr Beryle Beams.

## 2018-03-15 ENCOUNTER — Telehealth: Payer: Self-pay | Admitting: Internal Medicine

## 2018-03-15 MED ORDER — HYDROCODONE-ACETAMINOPHEN 10-325 MG PO TABS: 1.0000 | ORAL_TABLET | Freq: Four times a day (QID) | ORAL | 0 refills | Status: DC

## 2018-03-15 NOTE — Telephone Encounter (Addendum)
Calling to request refill on his Hydrocodone 10-325.  It was last filled 02/16/18.  He is leaving to go out of town tomorrow and would like to get this filled. He will not be returning until Sunday.  States he will run out of medication before he gets back home.    Pharmacy:  [facility]  Phone:  [phone]  Thank you.     Refilled early as requested due to out of town trip

## 2018-04-05 ENCOUNTER — Other Ambulatory Visit: Payer: Self-pay

## 2018-04-05 MED ORDER — DOXYCYCLINE HYCLATE 100 MG PO TABS: 100.0000 mg | ORAL_TABLET | Freq: Two times a day (BID) | ORAL | 0 refills | Status: DC

## 2018-04-08 ENCOUNTER — Other Ambulatory Visit: Payer: Self-pay

## 2018-04-08 DIAGNOSIS — G8929 Other chronic pain: Secondary | ICD-10-CM

## 2018-04-08 DIAGNOSIS — M79673 Pain in unspecified foot: Secondary | ICD-10-CM

## 2018-04-08 MED ORDER — CLONAZEPAM 0.5 MG PO TABS: ORAL_TABLET | ORAL | 0 refills | Status: DC

## 2018-04-15 ENCOUNTER — Telehealth: Payer: Self-pay | Admitting: Internal Medicine

## 2018-04-15 MED ORDER — HYDROCODONE-ACETAMINOPHEN 10-325 MG PO TABS: 1.0000 | ORAL_TABLET | Freq: Four times a day (QID) | ORAL | 0 refills | Status: DC

## 2018-04-15 NOTE — Telephone Encounter (Signed)
Pt stated his Rx for hydrocodone expires today. Made appt for Monday 04/18/18 for pt to get refill. Could pt get rx to last until Monday ? Thanks

## 2018-04-15 NOTE — Telephone Encounter (Signed)
Rx sent in

## 2018-04-15 NOTE — Telephone Encounter (Signed)
Hydrocodone refilled. See pt for follow up next week

## 2018-04-18 ENCOUNTER — Ambulatory Visit (INDEPENDENT_AMBULATORY_CARE_PROVIDER_SITE_OTHER): Payer: Medicare Other | Admitting: Internal Medicine

## 2018-04-18 ENCOUNTER — Encounter: Payer: Self-pay | Admitting: Internal Medicine

## 2018-04-18 VITALS — BP 140/80 | HR 84 | Temp 98.1°F | Ht 70.0 in | Wt 183.0 lb

## 2018-04-18 DIAGNOSIS — Z7901 Long term (current) use of anticoagulants: Secondary | ICD-10-CM

## 2018-04-18 DIAGNOSIS — G8929 Other chronic pain: Secondary | ICD-10-CM | POA: Diagnosis not present

## 2018-04-18 DIAGNOSIS — M21961 Unspecified acquired deformity of right lower leg: Secondary | ICD-10-CM

## 2018-04-18 DIAGNOSIS — F5104 Psychophysiologic insomnia: Secondary | ICD-10-CM

## 2018-04-18 DIAGNOSIS — E119 Type 2 diabetes mellitus without complications: Secondary | ICD-10-CM | POA: Diagnosis not present

## 2018-04-18 DIAGNOSIS — Z86711 Personal history of pulmonary embolism: Secondary | ICD-10-CM | POA: Diagnosis not present

## 2018-04-18 DIAGNOSIS — M79671 Pain in right foot: Secondary | ICD-10-CM | POA: Diagnosis not present

## 2018-04-18 DIAGNOSIS — I10 Essential (primary) hypertension: Secondary | ICD-10-CM

## 2018-04-18 NOTE — Progress Notes (Signed)
   Subjective:    Patient ID: Roberto Reid, male    DOB: Jun 21, 1932, 84 y.o.   MRN: [id number]  HPI 84 year old Male here today for chronic pain management follow-up.  He has chronic right foot pain and does not want to have further surgery.  Takes Norco responsibly.  Urine drug screen obtained today.  Otherwise doing fairly well with no new concerns.  Reiterated need to have q. 65-month visits for chronic pain management and he concurs.    Review of Systems no new complaints.  History of controlled type 2 diabetes mellitus.  History of hypertension.  History of recurrent cellulitis of lower extremities.  Had Medicare wellness visit in August.     Objective:   Physical Exam His right toe crosses over second toe.  He says he has a broken pin in that foot that did not heal well after surgery.  He has right second metatarsalgia secondary to Morton's foot.  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  He went to [facility] to see Dr. Para March regarding his foot and had a procedure done that initially seemed to help but did not give long-term pain relief.       Assessment & Plan:  Chronic right foot pain-chronic pain medication refilled this past Friday.  Drug screen obtained today.  Chronic anticoagulation for history of PE.  Followed by Dr. Beryle Beams.  He continues to travel frequently for business and probably should stay on anticoagulation long-term.  Essential hypertension-stable  Controlled type 2 diabetes mellitus-stable  Plan: Return in 3 months for follow-up on chronic pain and other medical issues.

## 2018-04-19 NOTE — Patient Instructions (Signed)
Urine drug screen pending.  No change in foot exam.  Follow-up in 3 months.  Continue chronic pain management visits and chronic pain medication.

## 2018-04-21 LAB — PAIN MGMT, PROFILE 8 W/CONF, U
6 Acetylmorphine: NEGATIVE ng/mL (ref <10)
ALCOHOL METABOLITES: POSITIVE ng/mL — AB (ref <500)
Amphetamines: NEGATIVE ng/mL (ref <500)
BENZODIAZEPINES: NEGATIVE ng/mL (ref <100)
Buprenorphine, Urine: NEGATIVE ng/mL (ref <5)
CREATININE: 120.5 mg/dL
Cocaine Metabolite: NEGATIVE ng/mL (ref <150)
Codeine: NEGATIVE ng/mL (ref <50)
ETHYL SULFATE (ETS): 5451 ng/mL — AB (ref <100)
Ethyl Glucuronide (ETG): 25389 ng/mL — ABNORMAL HIGH (ref <500)
HYDROCODONE: 2888 ng/mL — AB (ref <50)
Hydromorphone: 1367 ng/mL — ABNORMAL HIGH (ref <50)
MARIJUANA METABOLITE: NEGATIVE ng/mL (ref <20)
MDMA: NEGATIVE ng/mL (ref <500)
MORPHINE: NEGATIVE ng/mL (ref <50)
NORHYDROCODONE: 2369 ng/mL — AB (ref <50)
OPIATES: POSITIVE ng/mL — AB (ref <100)
OXIDANT: NEGATIVE ug/mL (ref <200)
Oxycodone: NEGATIVE ng/mL (ref <100)
pH: 5.75 (ref 4.5–9.0)

## 2018-05-05 ENCOUNTER — Other Ambulatory Visit: Payer: Self-pay

## 2018-05-05 DIAGNOSIS — M79673 Pain in unspecified foot: Secondary | ICD-10-CM

## 2018-05-05 DIAGNOSIS — G8929 Other chronic pain: Secondary | ICD-10-CM

## 2018-05-05 MED ORDER — AMLODIPINE BESYLATE 5 MG PO TABS: 5.0000 mg | ORAL_TABLET | Freq: Every day | ORAL | 3 refills | Status: DC

## 2018-05-05 MED ORDER — CLONAZEPAM 0.5 MG PO TABS: ORAL_TABLET | ORAL | 5 refills | Status: DC

## 2018-05-05 MED ORDER — ZOLPIDEM TARTRATE 10 MG PO TABS: 10.0000 mg | ORAL_TABLET | Freq: Every day | ORAL | 1 refills | Status: DC

## 2018-05-16 DIAGNOSIS — D0461 Carcinoma in situ of skin of right upper limb, including shoulder: Secondary | ICD-10-CM | POA: Diagnosis not present

## 2018-05-17 ENCOUNTER — Other Ambulatory Visit: Payer: Self-pay | Admitting: Internal Medicine

## 2018-05-17 MED ORDER — HYDROCODONE-ACETAMINOPHEN 10-325 MG PO TABS: 1.0000 | ORAL_TABLET | Freq: Four times a day (QID) | ORAL | 0 refills | Status: DC

## 2018-05-17 NOTE — Telephone Encounter (Signed)
Patient calling to request refill on his Norco 10-325mg .  Last filled on 11/15.  Not due for a pain mgt visit until 07/2018.    Pharmacy:  [facility]  Phone:  [phone]  Thank you.

## 2018-05-17 NOTE — Telephone Encounter (Signed)
Refilled

## 2018-05-26 ENCOUNTER — Other Ambulatory Visit: Payer: Self-pay

## 2018-05-26 MED ORDER — LUBIPROSTONE 24 MCG PO CAPS: 24.0000 ug | ORAL_CAPSULE | Freq: Two times a day (BID) | ORAL | 11 refills | Status: DC

## 2018-06-06 ENCOUNTER — Ambulatory Visit (INDEPENDENT_AMBULATORY_CARE_PROVIDER_SITE_OTHER): Payer: Medicare Other | Admitting: Internal Medicine

## 2018-06-06 ENCOUNTER — Encounter: Payer: Self-pay | Admitting: Internal Medicine

## 2018-06-06 ENCOUNTER — Telehealth: Payer: Self-pay | Admitting: Internal Medicine

## 2018-06-06 VITALS — BP 120/70 | HR 113 | Temp 98.4°F | Ht 70.0 in | Wt 188.0 lb

## 2018-06-06 DIAGNOSIS — L97519 Non-pressure chronic ulcer of other part of right foot with unspecified severity: Secondary | ICD-10-CM | POA: Diagnosis not present

## 2018-06-06 DIAGNOSIS — L03115 Cellulitis of right lower limb: Secondary | ICD-10-CM

## 2018-06-06 LAB — CBC WITH DIFFERENTIAL/PLATELET
Absolute Monocytes: 348 cells/uL (ref 200–950)
Basophils Absolute: 31 cells/uL (ref 0–200)
Basophils Relative: 0.6 %
Eosinophils Absolute: 88 cells/uL (ref 15–500)
Eosinophils Relative: 1.7 %
HCT: 36.1 % — ABNORMAL LOW (ref 38.5–50.0)
Hemoglobin: 12.6 g/dL — ABNORMAL LOW (ref 13.2–17.1)
LYMPHS ABS: 1440 {cells}/uL (ref 850–3900)
MCH: 33 pg (ref 27.0–33.0)
MCHC: 34.9 g/dL (ref 32.0–36.0)
MCV: 94.5 fL (ref 80.0–100.0)
MPV: 11.7 fL (ref 7.5–12.5)
Monocytes Relative: 6.7 %
Neutro Abs: 3292 cells/uL (ref 1500–7800)
Neutrophils Relative %: 63.3 %
PLATELETS: 194 10*3/uL (ref 140–400)
RBC: 3.82 10*6/uL — ABNORMAL LOW (ref 4.20–5.80)
RDW: 12.8 % (ref 11.0–15.0)
Total Lymphocyte: 27.7 %
WBC: 5.2 10*3/uL (ref 3.8–10.8)

## 2018-06-06 MED ORDER — CEFTRIAXONE SODIUM 1 G IJ SOLR
1.0000 g | Freq: Once | INTRAMUSCULAR | Status: AC
  Administered 2018-06-06: 1 g via INTRAMUSCULAR

## 2018-06-06 MED ORDER — DOXYCYCLINE HYCLATE 100 MG PO TABS: 100.0000 mg | ORAL_TABLET | Freq: Two times a day (BID) | ORAL | 0 refills | Status: DC

## 2018-06-06 NOTE — Telephone Encounter (Signed)
Called patient at 11:10 to advise that you want him to come ahead now to be worked in.  Had to leave patient a voicemail.  Asked patient to call back to let me know that he received the voicemail.

## 2018-06-06 NOTE — Telephone Encounter (Signed)
Lamar spoke with patient and he is on his way to be worked in this morning.

## 2018-06-06 NOTE — Telephone Encounter (Signed)
Come and be worked in now. May have to wait.

## 2018-06-06 NOTE — Progress Notes (Signed)
   Subjective:    Patient ID: Roberto Reid, male    DOB: 09/30/1932, 85 y.o.   MRN: [id number]  HPI 85 year old  Male with infected right second toe. As well as cellulitis right leg. Has had open sore dorsum of second toe. Apparently has had this at least 2 weeks. Has been busy with traveling etc. Going to [place]. to pick up new dog tomorrow. Has not had ABI studies. These were ordered. Hx chronic right foot deformity longstanding Hx DVT RLE on chronic anticoagulation. Takes chronic pain meds for chronic right foot pain.Had percutaneous FDL tenotomy by Dr. Para March at [facility] 2017 with relief of pain but pain has returned a few months after that.Hx pes planovalgus,hallux valgus, failure joint fusion and midfoot collapse. Has had foot surgery in remote past and Xray 2017 showed first and second TMT arthrodesis with broken screws.   Review of Systems see above     Objective:   Physical Exam Increased warmth and redness mid right lower leg c/w cellulitis. Has open ulcer dorsum of right second toe.       Assessment & Plan:  Right second toe ulcer Cellultiis RLE Chronic anticoagulation for hx DVT  ABI studies ordered  Would like him to see Dr. Marla Roe for management of toe ulcer.Rocephin one gram IM. Doxycycline 100 mg bid x 10 days Follow up Thursday.

## 2018-06-06 NOTE — Telephone Encounter (Signed)
Patient states that he has this sore on his right foot that has been there for about a month and it will not heal.  He has been taking the Levaquin but it hasn't helped.  This is a place that he had surgically repaired years ago and it is now bothering him again.  He wants to know if you would have time to look at it today because he feels like it is infected.    Phone:  [phone]  Thank you.

## 2018-06-09 ENCOUNTER — Encounter: Payer: Self-pay | Admitting: Internal Medicine

## 2018-06-09 ENCOUNTER — Ambulatory Visit (INDEPENDENT_AMBULATORY_CARE_PROVIDER_SITE_OTHER): Payer: Medicare Other | Admitting: Internal Medicine

## 2018-06-09 VITALS — BP 100/70 | HR 62 | Temp 98.1°F | Ht 70.0 in | Wt 188.0 lb

## 2018-06-09 DIAGNOSIS — L03115 Cellulitis of right lower limb: Secondary | ICD-10-CM | POA: Diagnosis not present

## 2018-06-09 DIAGNOSIS — L97519 Non-pressure chronic ulcer of other part of right foot with unspecified severity: Secondary | ICD-10-CM

## 2018-06-09 MED ORDER — CEFTRIAXONE SODIUM 1 G IJ SOLR
1.0000 g | Freq: Once | INTRAMUSCULAR | Status: AC
  Administered 2018-06-09: 1 g via INTRAMUSCULAR

## 2018-06-10 ENCOUNTER — Ambulatory Visit
Admission: RE | Admit: 2018-06-10 | Discharge: 2018-06-10 | Disposition: A | Discharge status: Home / Self Care | Payer: Medicare Other | Source: Ambulatory Visit | Attending: Internal Medicine | Admitting: Internal Medicine

## 2018-06-10 DIAGNOSIS — S63293A Dislocation of distal interphalangeal joint of left middle finger, initial encounter: Secondary | ICD-10-CM | POA: Diagnosis not present

## 2018-06-10 DIAGNOSIS — L089 Local infection of the skin and subcutaneous tissue, unspecified: Secondary | ICD-10-CM

## 2018-06-10 DIAGNOSIS — L97519 Non-pressure chronic ulcer of other part of right foot with unspecified severity: Secondary | ICD-10-CM

## 2018-06-13 DIAGNOSIS — L97512 Non-pressure chronic ulcer of other part of right foot with fat layer exposed: Secondary | ICD-10-CM | POA: Insufficient documentation

## 2018-06-13 DIAGNOSIS — M1811 Unilateral primary osteoarthritis of first carpometacarpal joint, right hand: Secondary | ICD-10-CM | POA: Diagnosis not present

## 2018-06-13 DIAGNOSIS — M19071 Primary osteoarthritis, right ankle and foot: Secondary | ICD-10-CM | POA: Diagnosis not present

## 2018-06-13 DIAGNOSIS — Z9889 Other specified postprocedural states: Secondary | ICD-10-CM | POA: Diagnosis not present

## 2018-06-13 DIAGNOSIS — S93114A Dislocation of interphalangeal joint of right lesser toe(s), initial encounter: Secondary | ICD-10-CM | POA: Diagnosis not present

## 2018-06-13 DIAGNOSIS — Z967 Presence of other bone and tendon implants: Secondary | ICD-10-CM | POA: Diagnosis not present

## 2018-06-14 ENCOUNTER — Ambulatory Visit (HOSPITAL_COMMUNITY)
Admission: RE | Admit: 2018-06-14 | Discharge: 2018-06-14 | Disposition: A | Discharge status: Home / Self Care | Payer: Medicare Other | Source: Ambulatory Visit | Attending: Internal Medicine | Admitting: Internal Medicine

## 2018-06-14 DIAGNOSIS — L97519 Non-pressure chronic ulcer of other part of right foot with unspecified severity: Secondary | ICD-10-CM | POA: Insufficient documentation

## 2018-06-16 ENCOUNTER — Telehealth: Payer: Self-pay | Admitting: Internal Medicine

## 2018-06-16 MED ORDER — HYDROCODONE-ACETAMINOPHEN 10-325 MG PO TABS: 1.0000 | ORAL_TABLET | Freq: Four times a day (QID) | ORAL | 0 refills | Status: DC

## 2018-06-16 NOTE — Telephone Encounter (Addendum)
Patient calling to request refill on his Norco.  States he is due for refill as of tomorrow, 1/17.    Thank you.    Refilled as requested

## 2018-06-17 ENCOUNTER — Institutional Professional Consult (permissible substitution): Payer: Medicare Other | Admitting: Plastic Surgery

## 2018-06-20 ENCOUNTER — Encounter: Payer: Self-pay | Admitting: Internal Medicine

## 2018-06-20 ENCOUNTER — Telehealth: Payer: Self-pay | Admitting: Internal Medicine

## 2018-06-20 MED ORDER — DOXYCYCLINE HYCLATE 100 MG PO TABS: 100.0000 mg | ORAL_TABLET | Freq: Two times a day (BID) | ORAL | 0 refills | Status: DC

## 2018-06-20 NOTE — Progress Notes (Signed)
   Subjective:    Patient ID: Roberto Reid, male    DOB: [birth year], 85 y.o.   MRN: [id number]  HPI He has developed ulceration on right second toe.  Was seen on January 6 and was started on doxycycline.  He is here today for follow-up.  He rode in the car for a number of hours yesterday to pick up a dog in [place].  It was a long trip.  I am concerned he may have osteomyelitis of his toe.  He will have x-ray of the toe.  I think he is going to need some specialized wound care.  He will see Dr. Marla Roe in the near future.    Review of Systems see above-no fever or shaking chills     Objective:   Physical Exam  No change in toe from January 6 exam      Assessment & Plan:  Ulceration right second toe  Cellulitis right lower extremity-slightly improved  Plan: Get x-ray to rule out osteomyelitis and continue doxycycline.  Appointment with Dr.Dillingham for wound care management.  To have ABI studies.

## 2018-06-20 NOTE — Telephone Encounter (Signed)
Refill Doxycycline

## 2018-06-20 NOTE — Telephone Encounter (Signed)
Calling to request a refill on Doxycycline.  States that he thinks he needs a little more.    Pharmacy:  [facility]  Phone:  [phone]  Thank you.

## 2018-06-20 NOTE — Patient Instructions (Addendum)
Doxycycline 100 mg twice daily for 10 days.  Appointment with plastic surgeon.  To have toe x-ray.  To have ABI studies

## 2018-06-20 NOTE — Telephone Encounter (Signed)
DONE

## 2018-06-20 NOTE — Progress Notes (Deleted)
   Subjective:    Patient ID: Roberto Reid, male    DOB: 09-12-1932, 86 y.o.   MRN: [id number]  HPI    Review of Systems     Objective:   Physical Exam        Assessment & Plan:

## 2018-06-24 DIAGNOSIS — L97512 Non-pressure chronic ulcer of other part of right foot with fat layer exposed: Secondary | ICD-10-CM | POA: Diagnosis not present

## 2018-06-24 DIAGNOSIS — M869 Osteomyelitis, unspecified: Secondary | ICD-10-CM | POA: Diagnosis not present

## 2018-07-07 ENCOUNTER — Ambulatory Visit (INDEPENDENT_AMBULATORY_CARE_PROVIDER_SITE_OTHER): Payer: Medicare Other | Admitting: Internal Medicine

## 2018-07-07 VITALS — BP 180/90 | HR 72 | Temp 98.6°F | Ht 70.0 in | Wt 190.0 lb

## 2018-07-07 DIAGNOSIS — M869 Osteomyelitis, unspecified: Secondary | ICD-10-CM

## 2018-07-13 ENCOUNTER — Other Ambulatory Visit: Payer: Self-pay

## 2018-07-13 MED ORDER — ZOLPIDEM TARTRATE 10 MG PO TABS: 10.0000 mg | ORAL_TABLET | Freq: Every day | ORAL | 1 refills | Status: DC

## 2018-07-18 ENCOUNTER — Other Ambulatory Visit: Payer: Self-pay

## 2018-07-18 MED ORDER — HYDROCODONE-ACETAMINOPHEN 10-325 MG PO TABS: 1.0000 | ORAL_TABLET | Freq: Four times a day (QID) | ORAL | 0 refills | Status: DC

## 2018-07-18 NOTE — Telephone Encounter (Signed)
Patient called to request a refill on [facility], last refill 06/16/2018.

## 2018-07-24 ENCOUNTER — Encounter: Payer: Self-pay | Admitting: Internal Medicine

## 2018-07-24 NOTE — Progress Notes (Signed)
   Subjective:    Patient ID: Roberto Reid, male    DOB: 1932/08/05, 85 y.o.   MRN: [id number]  HPI 85 year old Male in today to discuss osteomyelitis of second toe right foot.  He was referred to podiatrist, Dr. Leane Call in [facility].  Amputation of the toe has been recommended.  He is concerned about this.    Review of Systems see above-no pain.  No fever or chills.     Objective:   Physical Exam The second toe right foot has begun to separate from his foot and is dangling.  He was unaware that there had been a separation from his foot.  I got a mirror and was actually able to show that to him by using the mirror.       Assessment & Plan:  Osteomyelitis second toe right foot  Plan: Agree with plan to amputate toe.

## 2018-07-24 NOTE — Patient Instructions (Signed)
Agree with plans to amputate second toe right foot due to osteomyelitis

## 2018-07-25 ENCOUNTER — Telehealth: Payer: Self-pay

## 2018-07-25 NOTE — Telephone Encounter (Signed)
Patient called he is requesting a referral to [facility] he said he wants to start all over and hopefully he can have his surgery there he said he had knee and elbow surgery there. He didn't like that Dr. Joya Gaskins asked for a letter from you for surgery clearance he said he has had surgeries in the past and no else has asked for a letter before he feels like she is making you "liable for the surgery".

## 2018-07-25 NOTE — Telephone Encounter (Addendum)
Michelle called [facility] and they apparently do not do this type of surgery but use Dr. Meridee Score at [facility].  I have left message for pt with this information. Options include either going back to [facility] (Dr. Para March), Dr. Sharol Given at [facility], Dr. Doran Durand at [facility] in [place] that has seen him before, or getting appt with Orthopedist at [facility] that does foot surgery.   March 5,2020 have refilled Seroquel and losartan

## 2018-08-04 MED ORDER — LOSARTAN POTASSIUM 100 MG PO TABS: 100.0000 mg | ORAL_TABLET | Freq: Every day | ORAL | 3 refills | Status: DC

## 2018-08-04 MED ORDER — QUETIAPINE FUMARATE 50 MG PO TABS: 50.0000 mg | ORAL_TABLET | Freq: Every day | ORAL | 5 refills | Status: DC

## 2018-08-08 DIAGNOSIS — M79671 Pain in right foot: Secondary | ICD-10-CM | POA: Diagnosis not present

## 2018-08-08 DIAGNOSIS — M2061 Acquired deformities of toe(s), unspecified, right foot: Secondary | ICD-10-CM | POA: Diagnosis not present

## 2018-08-10 DIAGNOSIS — X32XXXD Exposure to sunlight, subsequent encounter: Secondary | ICD-10-CM | POA: Diagnosis not present

## 2018-08-10 DIAGNOSIS — C44622 Squamous cell carcinoma of skin of right upper limb, including shoulder: Secondary | ICD-10-CM | POA: Diagnosis not present

## 2018-08-10 DIAGNOSIS — Z85828 Personal history of other malignant neoplasm of skin: Secondary | ICD-10-CM | POA: Diagnosis not present

## 2018-08-10 DIAGNOSIS — L570 Actinic keratosis: Secondary | ICD-10-CM | POA: Diagnosis not present

## 2018-08-10 DIAGNOSIS — Z08 Encounter for follow-up examination after completed treatment for malignant neoplasm: Secondary | ICD-10-CM | POA: Diagnosis not present

## 2018-08-10 DIAGNOSIS — C44612 Basal cell carcinoma of skin of right upper limb, including shoulder: Secondary | ICD-10-CM | POA: Diagnosis not present

## 2018-08-15 DIAGNOSIS — M79671 Pain in right foot: Secondary | ICD-10-CM | POA: Diagnosis not present

## 2018-08-16 ENCOUNTER — Other Ambulatory Visit: Payer: Self-pay

## 2018-08-16 MED ORDER — HYDROCODONE-ACETAMINOPHEN 10-325 MG PO TABS: 1.0000 | ORAL_TABLET | Freq: Four times a day (QID) | ORAL | 0 refills | Status: DC

## 2018-08-16 NOTE — Telephone Encounter (Signed)
Last refill 07/18/2018.

## 2018-08-22 ENCOUNTER — Encounter: Payer: Self-pay | Admitting: *Deleted

## 2018-09-13 ENCOUNTER — Other Ambulatory Visit: Payer: Self-pay

## 2018-09-13 DIAGNOSIS — G8929 Other chronic pain: Secondary | ICD-10-CM

## 2018-09-13 DIAGNOSIS — M79673 Pain in unspecified foot: Secondary | ICD-10-CM

## 2018-09-13 MED ORDER — CLONAZEPAM 0.5 MG PO TABS: ORAL_TABLET | ORAL | 5 refills | Status: DC

## 2018-09-15 ENCOUNTER — Other Ambulatory Visit: Payer: Self-pay | Admitting: Internal Medicine

## 2018-09-15 MED ORDER — HYDROCODONE-ACETAMINOPHEN 10-325 MG PO TABS: 1.0000 | ORAL_TABLET | Freq: Four times a day (QID) | ORAL | 0 refills | Status: DC

## 2018-09-15 NOTE — Telephone Encounter (Signed)
He can't come in next Wednesday, he scheduled for 09/28/18.

## 2018-09-15 NOTE — Telephone Encounter (Signed)
Roberto Reid [phone]   [facility]  HYDROcodone-acetaminophen [facility]) 10-325 MG tablet  Roberto called to say he needs a refill for above medication.  Last refill 08/16/18  Last OV 07/07/18  Last CPE 01/11/18

## 2018-09-15 NOTE — Telephone Encounter (Signed)
I will refill but he needs chronic pain management visit next week. Is Wednesday morning ok? Needs the drug screen Not done this since November.

## 2018-09-19 ENCOUNTER — Other Ambulatory Visit: Payer: Self-pay | Admitting: Internal Medicine

## 2018-09-19 ENCOUNTER — Encounter: Payer: Self-pay | Admitting: Internal Medicine

## 2018-09-19 DIAGNOSIS — M869 Osteomyelitis, unspecified: Secondary | ICD-10-CM | POA: Diagnosis not present

## 2018-09-19 MED ORDER — DOXYCYCLINE HYCLATE 100 MG PO TABS: 100.0000 mg | ORAL_TABLET | Freq: Two times a day (BID) | ORAL | 1 refills | Status: DC

## 2018-09-19 NOTE — Telephone Encounter (Addendum)
Telephone call to patient regarding request for refill on doxycycline.  In early January, he was diagnosed with osteomyelitis second toe right foot.  He saw a podiatrist in [facility] who recommended amputation.  However due to the COVID-19 outbreak, he says he was unable to get this done in a timely fashion and elective amputation surgery was postponed.  He says that the toe has a crack in it and is now bleeding.  He is worried about a superficial infection.  We will call in doxycycline 100 mg twice daily for 10 days with 1 refill.  for him but he needs to get this toe removed as soon as possible when it is safe to do so.

## 2018-09-19 NOTE — Telephone Encounter (Signed)
[name] Matton [phone]  doxycycline (VIBRA-TABS) 100 MG tablet  [name] called and said we should be getting a refill request for above medication. His toe has a sore on it and he feels like he needs this for preventative care.

## 2018-09-21 ENCOUNTER — Other Ambulatory Visit (HOSPITAL_COMMUNITY): Payer: Self-pay | Admitting: Orthopedic Surgery

## 2018-09-21 ENCOUNTER — Ambulatory Visit: Payer: Medicare Other | Admitting: Internal Medicine

## 2018-09-21 DIAGNOSIS — L97514 Non-pressure chronic ulcer of other part of right foot with necrosis of bone: Secondary | ICD-10-CM | POA: Diagnosis not present

## 2018-09-23 ENCOUNTER — Encounter (HOSPITAL_BASED_OUTPATIENT_CLINIC_OR_DEPARTMENT_OTHER): Payer: Self-pay | Admitting: *Deleted

## 2018-09-23 ENCOUNTER — Encounter: Payer: Self-pay | Admitting: Internal Medicine

## 2018-09-23 ENCOUNTER — Ambulatory Visit: Payer: Medicare Other | Admitting: Internal Medicine

## 2018-09-23 ENCOUNTER — Other Ambulatory Visit: Payer: Self-pay

## 2018-09-23 ENCOUNTER — Encounter (HOSPITAL_BASED_OUTPATIENT_CLINIC_OR_DEPARTMENT_OTHER)
Admission: RE | Admit: 2018-09-23 | Discharge: 2018-09-23 | Disposition: A | Discharge status: Home / Self Care | Payer: Medicare Other | Source: Ambulatory Visit | Attending: Orthopedic Surgery | Admitting: Orthopedic Surgery

## 2018-09-23 VITALS — BP 110/60 | HR 95 | Temp 98.3°F

## 2018-09-23 DIAGNOSIS — Z01810 Encounter for preprocedural cardiovascular examination: Secondary | ICD-10-CM | POA: Insufficient documentation

## 2018-09-23 DIAGNOSIS — M869 Osteomyelitis, unspecified: Secondary | ICD-10-CM

## 2018-09-23 DIAGNOSIS — M79671 Pain in right foot: Secondary | ICD-10-CM | POA: Diagnosis not present

## 2018-09-23 DIAGNOSIS — G8929 Other chronic pain: Secondary | ICD-10-CM | POA: Diagnosis not present

## 2018-09-23 NOTE — Progress Notes (Signed)
Ensure pre surgery drink given with instructions to complete by 0400 dos, pt verbalized understanding. 

## 2018-09-23 NOTE — Progress Notes (Signed)
Reviewed pmh with Dr. Lanetta Inch, no PAT appt needed. Pt states that he is to stop his Eliquis tomorrow for surgery. He has an appt with Dr. Renold Genta today. I called the office and requested that Dr Renold Genta include in her note that pt may stop his eliquis and for how long.

## 2018-09-24 NOTE — Patient Instructions (Signed)
To have amputation right second toe April 2018.  Cleared for surgery.  Continue narcotic pain medication.  Urine drug screen obtained today.  Follow-up in 3 months.  Continue same medications but hold Eliquis beginning today.

## 2018-09-24 NOTE — Progress Notes (Signed)
Subjective:    Patient ID: Roberto Reid, male    DOB: December 13, 1932, 85 y.o.   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  He is seen generally every 3 months regarding chronic pain management with Norco 10/325 which he takes responsibly.  He is seen in person today at the office.  A urine drug screen was obtained today due to policies regarding chronic pain medication therapy.  Last pain management profile was in November 2019 so he is slightly overdue for pain management visit.  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  He was seen in January regarding dislocation distal phalanx right second toe.  At that time he was felt to have osteomyelitis middle phalanx right second digit and distal portion of distal phalanx.  Amputation was recommended.  He saw a podiatrist in [facility] in late January but chose not to have surgery there.  He has a chronic right foot deformity and has seen Dr. Para March at [facility] in the past.  His chronic foot deformity is the basis for his need for chronic pain medication.  Recently saw Dr. Doran Durand and amputation is planned for Tuesday, April 28.  Patient is on chronic anticoagulation for history of pulmonary embolus.  He understands he should stop taking Eliquis today in anticipation of his surgery on Tuesday.  The surgery will be at [facility] surgery center.  He will not undergo general anesthesia.  He has essential hypertension, controlled type 2 diabetes mellitus, low testosterone, erectile dysfunction.  He had right foot surgery sometime ago and stated that there is a broken pin in his right foot.  He went to see Dr. Ouida Sills in [place] several years ago and Dr. Ouida Sills did not think it would be wise to operate on his foot.  He has an old Lisfranc fracture subluxation with instability of first and second  tarsometatarsal joints causing chronic pain.  He saw Dr. Para March at [facility] regarding his foot and had a procedure done that initially seemed to help but did not give him long-term relief.  He has a history of multiple bouts of pneumonia.  Last bout was in 2017.  He was subsequently found to have significant shortness of breath and was hospitalized with pulmonary embolism and now treated with Eliquis.  Dr. Beryle Beams has been seeing him.  Because he travels frequently by airplane,  it has been recommended that he remain on chronic anticoagulation therapy.  History of GE reflux treated with Nexium.  History of hyperlipidemia treated with statin medication.  Issues with chronic insomnia treated with Ambien and Seroquel.  History of anxiety treated with Klonopin.  Quit smoking in 1991.  Status post repair of right quadriceps tendon rupture by Dr.Lucey in 2016.  This was secondary to a fall during a trip to [place] and spine.  He fell down some marble steps.  Had left knee surgery done by Dr. Onnie Graham for torn cartilage November 2005.  Hospitalized in [place] September 2011 with right leg cellulitis and treated with IV antibiotics.  Status post cataract extraction both eyes 2010.  Has been evaluated in the remote past by Dr. Lia Foyer.  Cardiolite study 2008 was negative.  History of hearing loss.  History of Barrett's esophagus and adenomatous colon polyp.  History of kidney stones.   Social history: He is married.  No children from this marriage.  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  Moved from [place] to form [employer] a number of years ago and it was subsequently sold to [employer] a few years ago.  He is now a Optometrist in the laboratory business.     Review of Systems no new complaints     Objective:   Physical Exam  His toe was wrapped up and was not examined today.  Urine drug screen obtained.  He is alert and oriented and appears to be stable  for surgery      Assessment & Plan:  Osteomyelitis right second toe-scheduled for amputation by Dr. Doran Durand April 28.  Cleared for surgery.  Chronic anticoagulation.  We will stop Eliquis today.  Chronic right foot deformity requiring chronic narcotic pain medication which he takes responsibly.  Essential hypertension  Controlled diabetes mellitus  Anxiety and insomnia  Hyperlipidemia  Plan: He indicates he will have a preop EKG at the surgery center today.  We will plan to see him again in 3 months.  25 minutes spent with patient

## 2018-09-26 LAB — PAIN MGMT, PROFILE 8 W/CONF, U
6 Acetylmorphine: NEGATIVE ng/mL
Alcohol Metabolites: POSITIVE ng/mL — AB (ref <500)
Alphahydroxyalprazolam: NEGATIVE ng/mL
Alphahydroxymidazolam: NEGATIVE ng/mL
Alphahydroxytriazolam: NEGATIVE ng/mL
Aminoclonazepam: 106 ng/mL
Amphetamines: NEGATIVE ng/mL
Benzodiazepines: POSITIVE ng/mL
Buprenorphine, Urine: NEGATIVE ng/mL
Cocaine Metabolite: NEGATIVE ng/mL
Codeine: NEGATIVE ng/mL
Creatinine: 149 mg/dL
Ethyl Glucuronide (ETG): 25298 ng/mL
Ethyl Sulfate (ETS): 3656 ng/mL
Hydrocodone: 3487 ng/mL
Hydromorphone: 1683 ng/mL
Hydroxyethylflurazepam: NEGATIVE ng/mL
Lorazepam: NEGATIVE ng/mL
MDMA: NEGATIVE ng/mL
Marijuana Metabolite: NEGATIVE ng/mL
Morphine: NEGATIVE ng/mL
Nordiazepam: NEGATIVE ng/mL
Norhydrocodone: 2294 ng/mL
Opiates: POSITIVE ng/mL
Oxazepam: NEGATIVE ng/mL
Oxidant: NEGATIVE ug/mL
Oxycodone: NEGATIVE ng/mL
Temazepam: NEGATIVE ng/mL
pH: 5.4 (ref 4.5–9.0)

## 2018-09-27 ENCOUNTER — Ambulatory Visit (HOSPITAL_BASED_OUTPATIENT_CLINIC_OR_DEPARTMENT_OTHER): Payer: Medicare Other | Admitting: Certified Registered"

## 2018-09-27 ENCOUNTER — Encounter (HOSPITAL_BASED_OUTPATIENT_CLINIC_OR_DEPARTMENT_OTHER)
Admission: RE | Disposition: A | Discharge status: Home / Self Care | Payer: Self-pay | Source: Home / Self Care | Attending: Orthopedic Surgery

## 2018-09-27 ENCOUNTER — Ambulatory Visit (HOSPITAL_BASED_OUTPATIENT_CLINIC_OR_DEPARTMENT_OTHER)
Admission: RE | Admit: 2018-09-27 | Discharge: 2018-09-27 | Disposition: A | Discharge status: Home / Self Care | Payer: Medicare Other | Attending: Orthopedic Surgery | Admitting: Orthopedic Surgery

## 2018-09-27 ENCOUNTER — Encounter (HOSPITAL_BASED_OUTPATIENT_CLINIC_OR_DEPARTMENT_OTHER): Payer: Self-pay | Admitting: Certified Registered"

## 2018-09-27 DIAGNOSIS — E11621 Type 2 diabetes mellitus with foot ulcer: Secondary | ICD-10-CM | POA: Insufficient documentation

## 2018-09-27 DIAGNOSIS — Z9841 Cataract extraction status, right eye: Secondary | ICD-10-CM | POA: Diagnosis not present

## 2018-09-27 DIAGNOSIS — K219 Gastro-esophageal reflux disease without esophagitis: Secondary | ICD-10-CM | POA: Diagnosis not present

## 2018-09-27 DIAGNOSIS — L97519 Non-pressure chronic ulcer of other part of right foot with unspecified severity: Secondary | ICD-10-CM | POA: Insufficient documentation

## 2018-09-27 DIAGNOSIS — Z86718 Personal history of other venous thrombosis and embolism: Secondary | ICD-10-CM | POA: Insufficient documentation

## 2018-09-27 DIAGNOSIS — L97514 Non-pressure chronic ulcer of other part of right foot with necrosis of bone: Secondary | ICD-10-CM | POA: Diagnosis not present

## 2018-09-27 DIAGNOSIS — Z9842 Cataract extraction status, left eye: Secondary | ICD-10-CM | POA: Insufficient documentation

## 2018-09-27 DIAGNOSIS — Z885 Allergy status to narcotic agent status: Secondary | ICD-10-CM | POA: Diagnosis not present

## 2018-09-27 DIAGNOSIS — F419 Anxiety disorder, unspecified: Secondary | ICD-10-CM | POA: Insufficient documentation

## 2018-09-27 DIAGNOSIS — Z882 Allergy status to sulfonamides status: Secondary | ICD-10-CM | POA: Insufficient documentation

## 2018-09-27 DIAGNOSIS — H9190 Unspecified hearing loss, unspecified ear: Secondary | ICD-10-CM | POA: Insufficient documentation

## 2018-09-27 DIAGNOSIS — M86171 Other acute osteomyelitis, right ankle and foot: Secondary | ICD-10-CM | POA: Diagnosis not present

## 2018-09-27 DIAGNOSIS — I10 Essential (primary) hypertension: Secondary | ICD-10-CM | POA: Insufficient documentation

## 2018-09-27 DIAGNOSIS — M869 Osteomyelitis, unspecified: Secondary | ICD-10-CM | POA: Insufficient documentation

## 2018-09-27 DIAGNOSIS — Z87442 Personal history of urinary calculi: Secondary | ICD-10-CM | POA: Insufficient documentation

## 2018-09-27 DIAGNOSIS — M19041 Primary osteoarthritis, right hand: Secondary | ICD-10-CM | POA: Insufficient documentation

## 2018-09-27 DIAGNOSIS — Z820 Family history of epilepsy and other diseases of the nervous system: Secondary | ICD-10-CM | POA: Insufficient documentation

## 2018-09-27 DIAGNOSIS — E785 Hyperlipidemia, unspecified: Secondary | ICD-10-CM | POA: Insufficient documentation

## 2018-09-27 DIAGNOSIS — E1122 Type 2 diabetes mellitus with diabetic chronic kidney disease: Secondary | ICD-10-CM | POA: Diagnosis not present

## 2018-09-27 DIAGNOSIS — N184 Chronic kidney disease, stage 4 (severe): Secondary | ICD-10-CM | POA: Diagnosis not present

## 2018-09-27 DIAGNOSIS — Z87891 Personal history of nicotine dependence: Secondary | ICD-10-CM | POA: Diagnosis not present

## 2018-09-27 DIAGNOSIS — I4510 Unspecified right bundle-branch block: Secondary | ICD-10-CM | POA: Insufficient documentation

## 2018-09-27 DIAGNOSIS — E1169 Type 2 diabetes mellitus with other specified complication: Secondary | ICD-10-CM | POA: Insufficient documentation

## 2018-09-27 DIAGNOSIS — Z96652 Presence of left artificial knee joint: Secondary | ICD-10-CM | POA: Insufficient documentation

## 2018-09-27 DIAGNOSIS — Z7901 Long term (current) use of anticoagulants: Secondary | ICD-10-CM | POA: Insufficient documentation

## 2018-09-27 DIAGNOSIS — I129 Hypertensive chronic kidney disease with stage 1 through stage 4 chronic kidney disease, or unspecified chronic kidney disease: Secondary | ICD-10-CM | POA: Diagnosis not present

## 2018-09-27 DIAGNOSIS — Z79899 Other long term (current) drug therapy: Secondary | ICD-10-CM | POA: Diagnosis not present

## 2018-09-27 HISTORY — PX: AMPUTATION TOE: SHX6595

## 2018-09-27 SURGERY — AMPUTATION, TOE
Anesthesia: Monitor Anesthesia Care | Laterality: Right

## 2018-09-27 MED ORDER — SCOPOLAMINE 1 MG/3DAYS TD PT72: 1.0000 | MEDICATED_PATCH | Freq: Once | TRANSDERMAL | Status: DC | PRN

## 2018-09-27 MED ORDER — LACTATED RINGERS IV SOLN
INTRAVENOUS | Status: DC
  Administered 2018-09-27: 07:00:00 via INTRAVENOUS

## 2018-09-27 MED ORDER — BUPIVACAINE-EPINEPHRINE 0.5% -1:200000 IJ SOLN
INTRAMUSCULAR | Status: DC | PRN
  Administered 2018-09-27: 10 mL

## 2018-09-27 MED ORDER — 0.9 % SODIUM CHLORIDE (POUR BTL) OPTIME
TOPICAL | Status: DC | PRN
  Administered 2018-09-27: 08:00:00 1000 mL

## 2018-09-27 MED ORDER — FENTANYL CITRATE (PF) 100 MCG/2ML IJ SOLN
INTRAMUSCULAR | Status: AC
  Filled 2018-09-27: qty 2

## 2018-09-27 MED ORDER — CEFAZOLIN SODIUM-DEXTROSE 2-4 GM/100ML-% IV SOLN
2.0000 g | INTRAVENOUS | Status: AC
  Administered 2018-09-27: 2 g via INTRAVENOUS

## 2018-09-27 MED ORDER — CHLORHEXIDINE GLUCONATE 4 % EX LIQD: 60.0000 mL | Freq: Once | CUTANEOUS | Status: DC

## 2018-09-27 MED ORDER — MIDAZOLAM HCL 2 MG/2ML IJ SOLN: 1.0000 mg | INTRAMUSCULAR | Status: DC | PRN

## 2018-09-27 MED ORDER — LIDOCAINE HCL (CARDIAC) PF 100 MG/5ML IV SOSY
PREFILLED_SYRINGE | INTRAVENOUS | Status: DC | PRN
  Administered 2018-09-27: 60 mg via INTRAVENOUS

## 2018-09-27 MED ORDER — SODIUM CHLORIDE 0.9 % IV SOLN: INTRAVENOUS | Status: DC

## 2018-09-27 MED ORDER — PROMETHAZINE HCL 25 MG/ML IJ SOLN: 6.2500 mg | INTRAMUSCULAR | Status: DC | PRN

## 2018-09-27 MED ORDER — VANCOMYCIN HCL 500 MG IV SOLR
INTRAVENOUS | Status: DC | PRN
  Administered 2018-09-27: 500 mg via TOPICAL

## 2018-09-27 MED ORDER — HYDROMORPHONE HCL 1 MG/ML IJ SOLN: 0.2500 mg | INTRAMUSCULAR | Status: DC | PRN

## 2018-09-27 MED ORDER — ONDANSETRON HCL 4 MG/2ML IJ SOLN
INTRAMUSCULAR | Status: DC | PRN
  Administered 2018-09-27: 4 mg via INTRAVENOUS

## 2018-09-27 MED ORDER — CEFAZOLIN SODIUM-DEXTROSE 2-4 GM/100ML-% IV SOLN
INTRAVENOUS | Status: AC
  Filled 2018-09-27: qty 100

## 2018-09-27 MED ORDER — PROPOFOL 500 MG/50ML IV EMUL
INTRAVENOUS | Status: DC | PRN
  Administered 2018-09-27: 50 ug/kg/min via INTRAVENOUS

## 2018-09-27 MED ORDER — FENTANYL CITRATE (PF) 100 MCG/2ML IJ SOLN
50.0000 ug | INTRAMUSCULAR | Status: DC | PRN
  Administered 2018-09-27: 50 ug via INTRAVENOUS

## 2018-09-27 SURGICAL SUPPLY — 61 items
APL PRP STRL LF DISP 70% ISPRP (MISCELLANEOUS) ×1
BLADE AVERAGE 25X9 (BLADE) IMPLANT
BLADE OSC/SAG .038X5.5 CUT EDG (BLADE) IMPLANT
BLADE SURG 10 STRL SS (BLADE) ×1 IMPLANT
BLADE SURG 15 STRL LF DISP TIS (BLADE) ×1 IMPLANT
BLADE SURG 15 STRL SS (BLADE) ×2
BNDG CMPR 9X4 STRL LF SNTH (GAUZE/BANDAGES/DRESSINGS) ×1
BNDG COHESIVE 4X5 TAN STRL (GAUZE/BANDAGES/DRESSINGS) ×2 IMPLANT
BNDG CONFORM 3 STRL LF (GAUZE/BANDAGES/DRESSINGS) ×1 IMPLANT
BNDG ESMARK 4X9 LF (GAUZE/BANDAGES/DRESSINGS) ×2 IMPLANT
CHLORAPREP W/TINT 26 (MISCELLANEOUS) ×2 IMPLANT
COVER BACK TABLE REUSABLE LG (DRAPES) ×2 IMPLANT
COVER WAND RF STERILE (DRAPES) IMPLANT
CUFF TOURN SGL QUICK 24 (TOURNIQUET CUFF)
CUFF TRNQT CYL 24X4X16.5-23 (TOURNIQUET CUFF) IMPLANT
DECANTER SPIKE VIAL GLASS SM (MISCELLANEOUS) ×1 IMPLANT
DRAPE EXTREMITY T 121X128X90 (DISPOSABLE) ×2 IMPLANT
DRAPE SURG 17X23 STRL (DRAPES) ×2 IMPLANT
DRAPE U-SHAPE 47X51 STRL (DRAPES) ×2 IMPLANT
DRSG EMULSION OIL 3X3 NADH (GAUZE/BANDAGES/DRESSINGS) ×1 IMPLANT
DRSG MEPITEL 4X7.2 (GAUZE/BANDAGES/DRESSINGS) ×2 IMPLANT
DRSG PAD ABDOMINAL 8X10 ST (GAUZE/BANDAGES/DRESSINGS) IMPLANT
ELECT REM PT RETURN 9FT ADLT (ELECTROSURGICAL) ×2
ELECTRODE REM PT RTRN 9FT ADLT (ELECTROSURGICAL) ×1 IMPLANT
GAUZE SPONGE 4X4 12PLY STRL (GAUZE/BANDAGES/DRESSINGS) ×2 IMPLANT
GLOVE BIO SURGEON STRL SZ8 (GLOVE) ×2 IMPLANT
GLOVE BIOGEL PI IND STRL 8 (GLOVE) ×2 IMPLANT
GLOVE BIOGEL PI INDICATOR 8 (GLOVE) ×2
GLOVE ECLIPSE 8.0 STRL XLNG CF (GLOVE) ×2 IMPLANT
GOWN STRL REUS W/ TWL LRG LVL3 (GOWN DISPOSABLE) ×1 IMPLANT
GOWN STRL REUS W/ TWL XL LVL3 (GOWN DISPOSABLE) ×2 IMPLANT
GOWN STRL REUS W/TWL LRG LVL3 (GOWN DISPOSABLE) ×2
GOWN STRL REUS W/TWL XL LVL3 (GOWN DISPOSABLE) ×4
NDL HYPO 25X1 1.5 SAFETY (NEEDLE) IMPLANT
NDL SAFETY ECLIPSE 18X1.5 (NEEDLE) IMPLANT
NEEDLE HYPO 18GX1.5 SHARP (NEEDLE)
NEEDLE HYPO 25X1 1.5 SAFETY (NEEDLE) IMPLANT
NS IRRIG 1000ML POUR BTL (IV SOLUTION) ×2 IMPLANT
PACK BASIN DAY SURGERY FS (CUSTOM PROCEDURE TRAY) ×2 IMPLANT
PAD CAST 4YDX4 CTTN HI CHSV (CAST SUPPLIES) ×1 IMPLANT
PADDING CAST COTTON 4X4 STRL (CAST SUPPLIES) ×2
PENCIL BUTTON HOLSTER BLD 10FT (ELECTRODE) ×2 IMPLANT
SANITIZER HAND PURELL 535ML FO (MISCELLANEOUS) ×2 IMPLANT
SHEET MEDIUM DRAPE 40X70 STRL (DRAPES) ×2 IMPLANT
SLEEVE SCD COMPRESS KNEE MED (MISCELLANEOUS) ×2 IMPLANT
SPONGE LAP 18X18 RF (DISPOSABLE) ×2 IMPLANT
STOCKINETTE 6  STRL (DRAPES) ×1
STOCKINETTE 6 STRL (DRAPES) ×1 IMPLANT
SUCTION FRAZIER HANDLE 10FR (MISCELLANEOUS) ×1
SUCTION TUBE FRAZIER 10FR DISP (MISCELLANEOUS) IMPLANT
SUT ETHILON 2 0 FS 18 (SUTURE) ×2 IMPLANT
SUT ETHILON 2 0 FSLX (SUTURE) ×1 IMPLANT
SUT ETHILON 3 0 PS 1 (SUTURE) IMPLANT
SUT MNCRL AB 3-0 PS2 18 (SUTURE) IMPLANT
SWAB COLLECTION DEVICE MRSA (MISCELLANEOUS) IMPLANT
SWAB CULTURE ESWAB REG 1ML (MISCELLANEOUS) IMPLANT
SYR BULB 3OZ (MISCELLANEOUS) ×2 IMPLANT
SYR CONTROL 10ML LL (SYRINGE) ×1 IMPLANT
TOWEL GREEN STERILE FF (TOWEL DISPOSABLE) ×2 IMPLANT
TUBE CONNECTING 20X1/4 (TUBING) ×1 IMPLANT
UNDERPAD 30X30 (UNDERPADS AND DIAPERS) ×2 IMPLANT

## 2018-09-27 NOTE — Op Note (Signed)
09/27/2018  7:58 AM  PATIENT:  Roberto Reid  85 y.o. male  PRE-OPERATIVE DIAGNOSIS: Right second toe chronic ulcer with osteomyelitis  POST-OPERATIVE DIAGNOSIS: Same  Procedure(s): Right foot second toe amputation through the MTP joint  SURGEON:  Wylene Simmer, MD  ASSISTANT: Mechele Claude, PA-C  ANESTHESIA:   Local, MAC  EBL:  minimal   TOURNIQUET: Less than 10 minutes with an ankle Esmarch  COMPLICATIONS:  None apparent  DISPOSITION:  Extubated, awake and stable to recovery.  INDICATION FOR PROCEDURE: The patient is an 85 year old male who has a severe right foot bunion deformity with crossover of the hallux onto the second toe.  He has a chronic ulcer at the second toe and has developed osteomyelitis.  He presents now for amputation of the second toe of his right foot.  The risks and benefits of the alternative treatment options have been discussed in detail.  The patient wishes to proceed with surgery and specifically understands risks of bleeding, infection, nerve damage, blood clots, need for additional surgery, amputation and death.  PROCEDURE IN DETAIL:  After pre operative consent was obtained, and the correct operative site was identified, the patient was brought to the operating room and placed supine on the OR table.  Anesthesia was administered.  Pre-operative antibiotics were administered.  A surgical timeout was taken.  The right lower extremity was prepped and draped in standard sterile fashion.  A metatarsal block was performed with half percent Marcaine with epinephrine.  The foot was then exsanguinated and a 4 inch Esmarch wrapped around the ankle.  A racquet incision was marked on the skin at the base of the second toe.  The incision was made and dissection was carried down through the subcutaneous tissues to the level of the bone.  Periosteum was incised and subperiosteal dissection was carried proximally to the level of the MTP joint.  The toe was disarticulated  through the MTP joint and passed off the field.  The neurovascular bundles were cauterized.  The wound was irrigated copiously.  Vancomycin powder was sprinkled in the wound.  The tourniquet was released, and hemostasis was achieved.  The skin incision was closed with horizontal mattress sutures of 2-0 nylon.  Sterile dressings were applied followed by a compression wrap.  The patient was awakened from anesthesia and transported to the recovery room in stable condition.  FOLLOW UP PLAN: Weightbearing as tolerated in a flat postop shoe.  He may resume anticoagulation on postop day 1.  Follow-up with me in the office in 2 weeks for suture removal.    Mechele Claude PA-C was present and scrubbed for the duration of the operative case. His assistance was essential in positioning the patient, prepping and draping, gaining and maintaining exposure, performing the operation, closing and dressing the wounds and applying the splint.

## 2018-09-27 NOTE — Anesthesia Procedure Notes (Signed)
Procedure Name: MAC Date/Time: 09/27/2018 7:37 AM Performed by: Signe Colt, CRNA Pre-anesthesia Checklist: Patient identified, Emergency Drugs available, Suction available, Patient being monitored and Timeout performed Patient Re-evaluated:Patient Re-evaluated prior to induction Oxygen Delivery Method: Simple face mask

## 2018-09-27 NOTE — Anesthesia Preprocedure Evaluation (Signed)
Anesthesia Evaluation  Patient identified by MRN, date of birth, ID band Patient awake    Reviewed: Allergy & Precautions, NPO status , Patient's Chart, lab work & pertinent test results  Airway Mallampati: I  TM Distance: >3 FB Neck ROM: Full    Dental no notable dental hx.    Pulmonary former smoker,    Pulmonary exam normal breath sounds clear to auscultation       Cardiovascular hypertension, Pt. on medications Normal cardiovascular exam Rhythm:Regular Rate:Normal     Neuro/Psych Anxiety    GI/Hepatic GERD  ,  Endo/Other  diabetes, Type 2  Renal/GU Renal disease     Musculoskeletal  (+) Arthritis , Osteoarthritis,    Abdominal   Peds  Hematology   Anesthesia Other Findings   Reproductive/Obstetrics                             Anesthesia Physical  Anesthesia Plan  ASA: III  Anesthesia Plan: MAC   Post-op Pain Management:    Induction: Intravenous  PONV Risk Score and Plan: 1 and Ondansetron  Airway Management Planned: Simple Face Mask  Additional Equipment:   Intra-op Plan:   Post-operative Plan:   Informed Consent: I have reviewed the patients History and Physical, chart, labs and discussed the procedure including the risks, benefits and alternatives for the proposed anesthesia with the patient or authorized representative who has indicated his/her understanding and acceptance.     Dental advisory given  Plan Discussed with: CRNA, Anesthesiologist and Surgeon  Anesthesia Plan Comments:         Anesthesia Quick Evaluation

## 2018-09-27 NOTE — OR Nursing (Signed)
Specimen (Right second toe) discarded per Dr. Doran Durand

## 2018-09-27 NOTE — Anesthesia Postprocedure Evaluation (Signed)
Anesthesia Post Note  Patient: Roberto Reid  Procedure(s) Performed: Right 2nd toe amputation (Right )     Patient location during evaluation: PACU Anesthesia Type: MAC Level of consciousness: awake and alert Pain management: pain level controlled Vital Signs Assessment: post-procedure vital signs reviewed and stable Respiratory status: spontaneous breathing, nonlabored ventilation and respiratory function stable Cardiovascular status: stable and blood pressure returned to baseline Postop Assessment: no apparent nausea or vomiting Anesthetic complications: no    Last Vitals:  Vitals:   09/27/18 0830 09/27/18 0843  BP: 118/67 123/64  Pulse: 78 80  Resp: 12 13  Temp:  36.7 C  SpO2: 98% 97%    Last Pain:  Vitals:   09/27/18 0843  TempSrc:   PainSc: 0-No pain                 Lynda Rainwater

## 2018-09-27 NOTE — Discharge Instructions (Addendum)
Roberto Simmer, MD [place]  Please read the following information regarding your care after surgery.  Medications  You only need a prescription for the narcotic pain medicine (ex. oxycodone, Percocet, Norco).  All of the other medicines listed below are available over the counter. X acetominophen (Tylenol) 650 mg every 4-6 hours as you need for minor to moderate pain  X To help prevent blood clots, resume your eliquis the day after surgery.  Weight Bearing X Bear weight only on your operated foot in the post-op shoe.   Cast / Splint / Dressing X Keep your splint, cast or dressing clean and dry.  Dont put anything (coat hanger, pencil, etc) down inside of it.  If it gets damp, use a hair dryer on the cool setting to dry it.  If it gets soaked, call the office to schedule an appointment for a cast change.  After your dressing, cast or splint is removed; you may shower, but do not soak or scrub the wound.  Allow the water to run over it, and then gently pat it dry.  Swelling It is normal for you to have swelling where you had surgery.  To reduce swelling and pain, keep your toes above your nose for at least 3 days after surgery.  It may be necessary to keep your foot or leg elevated for several weeks.  If it hurts, it should be elevated.  Follow Up Call my office at [phone] when you are discharged from the hospital or surgery center to schedule an appointment to be seen two weeks after surgery.  Call my office at [phone] if you develop a fever >101.5 F, nausea, vomiting, bleeding from the surgical site or severe pain.     Post Anesthesia Home Care Instructions  Activity: Get plenty of rest for the remainder of the day. A responsible individual must stay with you for 24 hours following the procedure.  For the next 24 hours, DO NOT: -Drive a car -Paediatric nurse -Drink alcoholic beverages -Take any medication unless instructed by your physician -Make any  legal decisions or sign important papers.  Meals: Start with liquid foods such as gelatin or soup. Progress to regular foods as tolerated. Avoid greasy, spicy, heavy foods. If nausea and/or vomiting occur, drink only clear liquids until the nausea and/or vomiting subsides. Call your physician if vomiting continues.  Special Instructions/Symptoms: Your throat may feel dry or sore from the anesthesia or the breathing tube placed in your throat during surgery. If this causes discomfort, gargle with warm salt water. The discomfort should disappear within 24 hours.  Regional Anesthesia Blocks  1. Numbness or the inability to move the "blocked" extremity may last from 3-48 hours after placement. The length of time depends on the medication injected and your individual response to the medication. If the numbness is not going away after 48 hours, call your surgeon.  2. The extremity that is blocked will need to be protected until the numbness is gone and the  Strength has returned. Because you cannot feel it, you will need to take extra care to avoid injury. Because it may be weak, you may have difficulty moving it or using it. You may not know what position it is in without looking at it while the block is in effect.  3. For blocks in the legs and feet, returning to weight bearing and walking needs to be done carefully. You will need to wait until the numbness is entirely gone and the strength has  returned. You should be able to move your leg and foot normally before you try and bear weight or walk. You will need someone to be with you when you first try to ensure you do not fall and possibly risk injury.  4. Bruising and tenderness at the needle site are common side effects and will resolve in a few days.  5. Persistent numbness or new problems with movement should be communicated to the surgeon or the [facility] [phone]).

## 2018-09-27 NOTE — Transfer of Care (Signed)
Immediate Anesthesia Transfer of Care Note  Patient: Roberto Reid  Procedure(s) Performed: Right 2nd toe amputation (Right )  Patient Location: PACU  Anesthesia Type:MAC  Level of Consciousness: awake, alert , oriented and patient cooperative  Airway & Oxygen Therapy: Patient Spontanous Breathing and Patient connected to face mask oxygen  Post-op Assessment: Report given to RN and Post -op Vital signs reviewed and stable  Post vital signs: Reviewed and stable  Last Vitals:  Vitals Value Taken Time  BP 102/62 09/27/2018  7:57 AM  Temp    Pulse 76 09/27/2018  7:59 AM  Resp 12 09/27/2018  7:59 AM  SpO2 100 % 09/27/2018  7:59 AM  Vitals shown include unvalidated device data.  Last Pain:  Vitals:   09/27/18 5597  TempSrc: Oral  PainSc: 0-No pain      Patients Stated Pain Goal: 4 (41/63/84 5364)  Complications: No apparent anesthesia complications

## 2018-09-27 NOTE — H&P (Signed)
Roberto Reid is an 85 y.o. male.   Chief Complaint: right foot ulcer HPI: The patient is an 85 year old male without significant past medical history.  He has a severe bunion deformity of his right foot and has had an ulcer at the second toe for many months.  He had an MRI of the forefoot showing osteomyelitis of the distal phalanx.  He presents now for operative treatment of this chronic nonhealing ulcer and underlying bone infection.  Past Medical History:  Diagnosis Date  . Arthritis    "right hand" (06/15/2016)  . Childhood asthma   . Chronic anticoagulation 08/30/2017  . DVT, lower extremity, proximal, acute, right ([place]) 08/24/2016   06/15/16  . GERD (gastroesophageal reflux disease)   . Hearing loss   . History of kidney stones   . Hyperlipidemia   . Hypertension   . Pneumonia    "I've had it ~ 9 times as an adult; last time was 04/2016" (06/15/2016)    Past Surgical History:  Procedure Laterality Date  . CATARACT EXTRACTION W/ INTRAOCULAR LENS  IMPLANT, BILATERAL Bilateral 04/2009  . ELBOW BURSA SURGERY Right   . FOOT SURGERY Right 2010   "attempted reconstruction" per pt  . JOINT REPLACEMENT    . KIDNEY STONE SURGERY    . KNEE ARTHROSCOPY Right 10/08/2014   Procedure: ARTHROSCOPY KNEE;  Surgeon: Vickey Huger, MD;  Location: [facility];  Service: Orthopedics;  Laterality: Right;  . KNEE ARTHROSCOPY Left    repair torn knee cartilage  . QUADRICEPS TENDON REPAIR Right 10/08/2014   Procedure: REPAIR QUADRICEP TENDON;  Surgeon: Vickey Huger, MD;  Location: [facility];  Service: Orthopedics;  Laterality: Right;  . TONSILLECTOMY    . TOTAL KNEE ARTHROPLASTY  12/14/2011   Procedure: TOTAL KNEE ARTHROPLASTY;  Surgeon: Rudean Haskell, MD;  Location: [facility];  Service: Orthopedics;  Laterality: Left;    Family History  Problem Relation Age of Onset  . Parkinson's disease Father    Social History:  reports that he quit smoking about 26 years ago. His smoking use included cigarettes. He has a  27.75 pack-year smoking history. He has never used smokeless tobacco. He reports current alcohol use of about 7.0 standard drinks of alcohol per week. He reports that he does not use drugs.  Allergies:  Allergies  Allergen Reactions  . Oxycodone-Acetaminophen Other (See Comments)    confusion  . Percocet [Oxycodone-Acetaminophen]     Wild hallucinations. He states he has tolerated plain oxycodone in the past.   . Sulfa Antibiotics   . Sulfamethoxazole Rash    Medications Prior to Admission  Medication Sig Dispense Refill  . amLODipine (NORVASC) 5 MG tablet Take 1 tablet (5 mg total) by mouth daily. 90 tablet 3  . clonazePAM (KLONOPIN) 0.5 MG tablet TAKE 1 TO 2 TABS PO PRN FOR ANXIETY 60 tablet 5  . Coenzyme Q10 (CO Q-10 PO) Take 1 tablet by mouth daily. 300 mg    . doxycycline (VIBRA-TABS) 100 MG tablet Take 1 tablet (100 mg total) by mouth 2 (two) times daily. 20 tablet 1  . ELIQUIS 2.5 MG TABS tablet TAKE 1 TABLET BY MOUTH 2 TIMES DAILY 60 tablet 6  . esomeprazole (NEXIUM) 40 MG capsule Take 1 capsule (40 mg total) by mouth 2 (two) times daily. 60 capsule 11  . HYDROcodone-acetaminophen (NORCO) 10-325 MG tablet Take 1 tablet by mouth every 6 (six) hours. 124 tablet 0  . loratadine (CLARITIN) 10 MG tablet Take 10 mg by mouth daily  as needed for allergies.    Marland Kitchen losartan (COZAAR) 100 MG tablet Take 1 tablet (100 mg total) by mouth daily. 90 tablet 3  . Multiple Vitamins-Minerals (MULTIVITAMIN WITH MINERALS) tablet Take 1 tablet by mouth daily.    . QUEtiapine (SEROQUEL) 50 MG tablet Take 1 tablet (50 mg total) by mouth at bedtime. 30 tablet 5  . Testosterone (ANDROGEL PUMP) 20.25 MG/ACT (1.62%) GEL 20.25 mg by Pump Prime route daily. Apply 6 pumps everyday as directed.    . zolpidem (AMBIEN) 10 MG tablet Take 1 tablet (10 mg total) by mouth at bedtime. 90 tablet 1  . lubiprostone (AMITIZA) 24 MCG capsule Take 1 capsule (24 mcg total) by mouth 2 (two) times daily with a meal. 60 capsule 11     No results found for this or any previous visit (from the past 48 hour(s)). No results found.  ROS no recent fever, chills, nausea, vomiting or changes in his appetite  Blood pressure (!) 149/63, pulse 100, temperature 98.1 F (36.7 C), temperature source Oral, resp. rate 16, height 5' 10.5" (1.791 m), weight 88.2 kg, SpO2 95 %. Physical Exam  Well-nourished well-developed elderly man in no apparent distress.  Alert and oriented x4.  Mood and affect are normal.  Extraocular motions are intact.  Respirations are unlabored.  Gait is normal.  The right foot has a severe bunion deformity.  The second toe is swollen distally.  Skin is otherwise healthy and intact.  Brisk capillary refill at the forefoot.  No lymphadenopathy.  5 out of 5 strength in plantar flexion and dorsiflexion of the ankle. Assessment/Plan Right foot second toe ulcer and osteomyelitis -to the operating room today for right second toe amputation.  The risks and benefits of the alternative treatment options have been discussed in detail.  The patient wishes to proceed with surgery and specifically understands risks of bleeding, infection, nerve damage, blood clots, need for additional surgery, amputation and death.   Wylene Simmer, MD 28-Sep-2018, 7:24 AM

## 2018-09-28 ENCOUNTER — Ambulatory Visit: Payer: Medicare Other | Admitting: Internal Medicine

## 2018-09-28 ENCOUNTER — Encounter (HOSPITAL_BASED_OUTPATIENT_CLINIC_OR_DEPARTMENT_OTHER): Payer: Self-pay | Admitting: Orthopedic Surgery

## 2018-09-29 ENCOUNTER — Telehealth: Payer: Self-pay | Admitting: Internal Medicine

## 2018-09-29 NOTE — Telephone Encounter (Signed)
LVM to CB and schedule CPE and Labs

## 2018-10-03 NOTE — Telephone Encounter (Signed)
[name] CB and scheduled CPE

## 2018-10-13 ENCOUNTER — Other Ambulatory Visit: Payer: Self-pay | Admitting: Internal Medicine

## 2018-10-13 MED ORDER — HYDROCODONE-ACETAMINOPHEN 10-325 MG PO TABS: 1.0000 | ORAL_TABLET | Freq: Four times a day (QID) | ORAL | 0 refills | Status: DC

## 2018-10-13 NOTE — Telephone Encounter (Signed)
Pend Rx

## 2018-10-13 NOTE — Telephone Encounter (Signed)
Roberto Reid  [phone]  [facility]  HYDROcodone-acetaminophen [facility]) 10-325 MG tablet   Roberto called to say he needs refill on above medication, he will be out on Saturday

## 2018-10-31 ENCOUNTER — Encounter: Payer: Self-pay | Admitting: Internal Medicine

## 2018-10-31 ENCOUNTER — Telehealth: Payer: Self-pay | Admitting: Internal Medicine

## 2018-10-31 ENCOUNTER — Ambulatory Visit (INDEPENDENT_AMBULATORY_CARE_PROVIDER_SITE_OTHER): Payer: Medicare Other | Admitting: Internal Medicine

## 2018-10-31 DIAGNOSIS — H05232 Hemorrhage of left orbit: Secondary | ICD-10-CM

## 2018-10-31 DIAGNOSIS — S0083XA Contusion of other part of head, initial encounter: Secondary | ICD-10-CM | POA: Diagnosis not present

## 2018-10-31 DIAGNOSIS — G44319 Acute post-traumatic headache, not intractable: Secondary | ICD-10-CM | POA: Diagnosis not present

## 2018-10-31 NOTE — Telephone Encounter (Signed)
See in person today
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
## 2018-11-01 ENCOUNTER — Other Ambulatory Visit: Payer: Self-pay

## 2018-11-01 ENCOUNTER — Ambulatory Visit
Admission: RE | Admit: 2018-11-01 | Discharge: 2018-11-01 | Disposition: A | Discharge status: Home / Self Care | Payer: Medicare Other | Source: Ambulatory Visit | Attending: Internal Medicine | Admitting: Internal Medicine

## 2018-11-01 DIAGNOSIS — S0003XA Contusion of scalp, initial encounter: Secondary | ICD-10-CM | POA: Diagnosis not present
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
## 2018-11-01 NOTE — Patient Instructions (Signed)
Proceed with CT of the brain without contrast to rule out subdural hematoma.

## 2018-11-14 ENCOUNTER — Encounter: Payer: Self-pay | Admitting: Internal Medicine

## 2018-11-14 ENCOUNTER — Telehealth: Payer: Self-pay | Admitting: Internal Medicine

## 2018-11-14 MED ORDER — HYDROCODONE-ACETAMINOPHEN 10-325 MG PO TABS: 1.0000 | ORAL_TABLET | Freq: Four times a day (QID) | ORAL | 0 refills | Status: DC

## 2018-11-14 NOTE — Telephone Encounter (Signed)
Have refilled as requested

## 2018-11-14 NOTE — Telephone Encounter (Addendum)
Roberto Reid [phone]  [facility]  HYDROcodone-acetaminophen [facility]) 10-325 MG tablet  [name] called to say he is completely out of his above listed medication.  Last OV 6-1.20   Have refilled as requested MJB
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
## 2018-11-29 ENCOUNTER — Other Ambulatory Visit: Payer: Self-pay

## 2018-11-29 DIAGNOSIS — I824Y1 Acute embolism and thrombosis of unspecified deep veins of right proximal lower extremity: Secondary | ICD-10-CM

## 2018-11-29 DIAGNOSIS — N189 Chronic kidney disease, unspecified: Secondary | ICD-10-CM

## 2018-11-29 DIAGNOSIS — I2782 Chronic pulmonary embolism: Secondary | ICD-10-CM

## 2018-11-29 MED ORDER — APIXABAN 2.5 MG PO TABS: 2.5000 mg | ORAL_TABLET | Freq: Two times a day (BID) | ORAL | 6 refills | Status: DC

## 2018-11-29 MED ORDER — QUETIAPINE FUMARATE 50 MG PO TABS: 50.0000 mg | ORAL_TABLET | Freq: Every day | ORAL | 5 refills | Status: DC

## 2018-11-30 DIAGNOSIS — H5203 Hypermetropia, bilateral: Secondary | ICD-10-CM | POA: Diagnosis not present

## 2018-12-05 ENCOUNTER — Other Ambulatory Visit: Payer: Self-pay

## 2018-12-05 DIAGNOSIS — I824Y1 Acute embolism and thrombosis of unspecified deep veins of right proximal lower extremity: Secondary | ICD-10-CM

## 2018-12-05 DIAGNOSIS — I2782 Chronic pulmonary embolism: Secondary | ICD-10-CM

## 2018-12-05 DIAGNOSIS — N189 Chronic kidney disease, unspecified: Secondary | ICD-10-CM

## 2018-12-05 MED ORDER — APIXABAN 2.5 MG PO TABS: 2.5000 mg | ORAL_TABLET | Freq: Two times a day (BID) | ORAL | 3 refills | Status: DC

## 2018-12-13 ENCOUNTER — Other Ambulatory Visit: Payer: Self-pay | Admitting: Internal Medicine

## 2018-12-13 MED ORDER — HYDROCODONE-ACETAMINOPHEN 10-325 MG PO TABS: 1.0000 | ORAL_TABLET | Freq: Four times a day (QID) | ORAL | 0 refills | Status: DC

## 2018-12-13 NOTE — Telephone Encounter (Signed)
Roberto Reid [phone]  [facility]  HYDROcodone-acetaminophen [facility]) 10-325 MG tablet    [name] called to say he will need a refill tomorrow on the above listed medication.

## 2018-12-13 NOTE — Telephone Encounter (Signed)
I have refilled med but needs 90 day chronic pain visit in person

## 2018-12-15 NOTE — Telephone Encounter (Signed)
Scheduled appointment

## 2018-12-19 ENCOUNTER — Encounter: Payer: Self-pay | Admitting: Internal Medicine

## 2018-12-19 ENCOUNTER — Other Ambulatory Visit: Payer: Self-pay

## 2018-12-19 ENCOUNTER — Ambulatory Visit (INDEPENDENT_AMBULATORY_CARE_PROVIDER_SITE_OTHER): Payer: Medicare Other | Admitting: Internal Medicine

## 2018-12-19 VITALS — BP 110/70 | HR 68 | Temp 98.6°F | Ht 70.0 in | Wt 188.0 lb

## 2018-12-19 DIAGNOSIS — Z7901 Long term (current) use of anticoagulants: Secondary | ICD-10-CM

## 2018-12-19 DIAGNOSIS — Z86711 Personal history of pulmonary embolism: Secondary | ICD-10-CM | POA: Diagnosis not present

## 2018-12-19 DIAGNOSIS — M79671 Pain in right foot: Secondary | ICD-10-CM | POA: Diagnosis not present

## 2018-12-19 DIAGNOSIS — G8929 Other chronic pain: Secondary | ICD-10-CM | POA: Diagnosis not present

## 2018-12-19 DIAGNOSIS — E119 Type 2 diabetes mellitus without complications: Secondary | ICD-10-CM

## 2018-12-19 DIAGNOSIS — I10 Essential (primary) hypertension: Secondary | ICD-10-CM
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
## 2018-12-19 NOTE — Patient Instructions (Signed)
It was a pleasure to see you today.  No change in chronic pain medication.  Drug screen pending.  Follow-up in September for health maintenance exam and fasting labs.

## 2018-12-21 LAB — PAIN MGMT, PROFILE 8 W/CONF, U
6 Acetylmorphine: NEGATIVE ng/mL
Alcohol Metabolites: POSITIVE ng/mL — AB (ref <500)
Alphahydroxyalprazolam: NEGATIVE ng/mL
Alphahydroxymidazolam: NEGATIVE ng/mL
Alphahydroxytriazolam: NEGATIVE ng/mL
Aminoclonazepam: 157 ng/mL
Amphetamines: NEGATIVE ng/mL
Benzodiazepines: POSITIVE ng/mL
Buprenorphine, Urine: NEGATIVE ng/mL
Cocaine Metabolite: NEGATIVE ng/mL
Codeine: NEGATIVE ng/mL
Creatinine: 182.3 mg/dL
Ethyl Glucuronide (ETG): 17426 ng/mL
Ethyl Sulfate (ETS): 4608 ng/mL
Hydrocodone: 6427 ng/mL
Hydromorphone: 2860 ng/mL
Hydroxyethylflurazepam: NEGATIVE ng/mL
Lorazepam: NEGATIVE ng/mL
MDMA: NEGATIVE ng/mL
Marijuana Metabolite: NEGATIVE ng/mL
Morphine: NEGATIVE ng/mL
Nordiazepam: NEGATIVE ng/mL
Norhydrocodone: 4261 ng/mL
Opiates: POSITIVE ng/mL
Oxazepam: NEGATIVE ng/mL
Oxidant: NEGATIVE ug/mL
Oxycodone: NEGATIVE ng/mL
Temazepam: NEGATIVE ng/mL
pH: 5.2 (ref 4.5–9.0)

## 2018-12-23 ENCOUNTER — Telehealth: Payer: Self-pay | Admitting: Internal Medicine

## 2018-12-23 ENCOUNTER — Other Ambulatory Visit: Payer: Self-pay

## 2018-12-23 ENCOUNTER — Ambulatory Visit: Payer: Medicare Other | Admitting: Internal Medicine

## 2018-12-23 ENCOUNTER — Encounter: Payer: Self-pay | Admitting: Internal Medicine

## 2018-12-23 VITALS — BP 110/88 | HR 88 | Temp 98.4°F | Ht 70.0 in | Wt 188.0 lb

## 2018-12-23 DIAGNOSIS — L03116 Cellulitis of left lower limb: Secondary | ICD-10-CM

## 2018-12-23 LAB — CBC WITH DIFFERENTIAL/PLATELET
Absolute Monocytes: 670 cells/uL (ref 200–950)
Basophils Absolute: 39 cells/uL (ref 0–200)
Basophils Relative: 0.5 %
Eosinophils Absolute: 77 cells/uL (ref 15–500)
Eosinophils Relative: 1 %
HCT: 41.7 % (ref 38.5–50.0)
Hemoglobin: 14.4 g/dL (ref 13.2–17.1)
Lymphs Abs: 1378 cells/uL (ref 850–3900)
MCH: 33 pg (ref 27.0–33.0)
MCHC: 34.5 g/dL (ref 32.0–36.0)
MCV: 95.4 fL (ref 80.0–100.0)
MPV: 12.2 fL (ref 7.5–12.5)
Monocytes Relative: 8.7 %
Neutro Abs: 5536 cells/uL (ref 1500–7800)
Neutrophils Relative %: 71.9 %
Platelets: 168 10*3/uL (ref 140–400)
RBC: 4.37 10*6/uL (ref 4.20–5.80)
RDW: 12.8 % (ref 11.0–15.0)
Total Lymphocyte: 17.9 %
WBC: 7.7 10*3/uL (ref 3.8–10.8)

## 2018-12-23 MED ORDER — LEVOFLOXACIN 500 MG PO TABS: 500.0000 mg | ORAL_TABLET | Freq: Every day | ORAL | 2 refills | Status: DC

## 2018-12-23 MED ORDER — CEFTRIAXONE SODIUM 1 G IJ SOLR
1.0000 g | Freq: Once | INTRAMUSCULAR | Status: AC
  Administered 2018-12-23: 1 g via INTRAMUSCULAR

## 2018-12-23 NOTE — Telephone Encounter (Signed)
Scheduled OV.

## 2018-12-23 NOTE — Telephone Encounter (Signed)
Schedule OV

## 2018-12-23 NOTE — Telephone Encounter (Signed)
Roberto Reid [phone]  Roberto called to say that the other day he had chills and now his left leg is red. Would like to come in this afternoon for you to look at it.

## 2018-12-25 NOTE — Patient Instructions (Signed)
Rocephin 1 g IM.  CBC with differential drawn.  Levaquin 500 mg daily for 10 days.  Follow-up on Monday, July 27.

## 2018-12-25 NOTE — Progress Notes (Signed)
   Subjective:    Patient ID: Roberto Reid, male    DOB: February 20, 1933, 85 y.o.   MRN: [id number]  HPI Onset earlier in the week of leg leg redness and pain. Has developed sweating and shaking which he typically with episodes of cellulitis. Going out of town this coming Monday afternoon to [facility]. CBC drawn and is WNL.    Review of Systems     Objective:   Physical Exam  Prominent varicosities left lower extremity.  Left lower leg below the knee is erythematous.  No drainage noted.  Increased warmth.      Assessment & Plan:  Cellulitis left lower extremity  Plan: CBC with differential is within normal limits.  1 g IM Rocephin.  Start Levaquin 500 mg daily for 10 days.  Follow-up on Monday, July 27.

## 2018-12-26 ENCOUNTER — Ambulatory Visit: Payer: Medicare Other | Admitting: Internal Medicine

## 2018-12-26 ENCOUNTER — Encounter: Payer: Self-pay | Admitting: Internal Medicine

## 2018-12-26 ENCOUNTER — Other Ambulatory Visit: Payer: Self-pay

## 2018-12-26 VITALS — BP 102/60 | HR 96 | Temp 98.3°F | Ht 70.0 in | Wt 182.0 lb

## 2018-12-26 DIAGNOSIS — L03116 Cellulitis of left lower limb: Secondary | ICD-10-CM | POA: Diagnosis not present

## 2018-12-26 MED ORDER — CEFTRIAXONE SODIUM 1 G IJ SOLR
1.0000 g | Freq: Once | INTRAMUSCULAR | Status: AC
  Administered 2018-12-26: 1 g via INTRAMUSCULAR

## 2018-12-26 NOTE — Progress Notes (Signed)
   Subjective:    Patient ID: Roberto Reid, male    DOB: 22-Aug-1932, 85 y.o.   MRN: [id number]  HPI 85 year old Male  Leaving today for [facility].  Here today for follow-up of left lower extremity cellulitis.  Has been taking Levaquin since Friday.  Was given 1 g IM injection on Friday, July 24.  Symptoms of discomfort shakes and chills have resolved.  His white blood cell count was normal.    Review of Systems     Objective:   Physical Exam  He has erythema anterior left lower leg without pitting edema.      Assessment & Plan:  Cellulitis left leg-improved  Plan: Complete 10-day course of Levaquin 500 mg daily.  Keep leg elevated as much as possible.  Given another 1 g IM Rocephin here in the office today.

## 2018-12-26 NOTE — Patient Instructions (Signed)
1 g IM Rocephin given in office.  Finish 10-day course of Levaquin.  Keep leg elevated.

## 2019-01-13 ENCOUNTER — Other Ambulatory Visit: Payer: Self-pay

## 2019-01-13 ENCOUNTER — Telehealth: Payer: Self-pay | Admitting: Internal Medicine

## 2019-01-13 MED ORDER — HYDROCODONE-ACETAMINOPHEN 10-325 MG PO TABS: 1.0000 | ORAL_TABLET | Freq: Four times a day (QID) | ORAL | 0 refills | Status: DC

## 2019-01-13 NOTE — Telephone Encounter (Signed)
[name] [phone]  [facility]  HYDROcodone-acetaminophen [facility]) 10-325 MG tablet   [name] called to check on his refill for above medication.

## 2019-01-13 NOTE — Telephone Encounter (Signed)
Patient called to request a refill, last refill 12/13/2018.

## 2019-01-19 DIAGNOSIS — E291 Testicular hypofunction: Secondary | ICD-10-CM | POA: Diagnosis not present

## 2019-01-19 DIAGNOSIS — Z87442 Personal history of urinary calculi: Secondary | ICD-10-CM | POA: Diagnosis not present

## 2019-01-19 DIAGNOSIS — N5201 Erectile dysfunction due to arterial insufficiency: Secondary | ICD-10-CM | POA: Diagnosis not present

## 2019-02-08 ENCOUNTER — Other Ambulatory Visit: Payer: Self-pay

## 2019-02-08 ENCOUNTER — Telehealth: Payer: Self-pay | Admitting: Internal Medicine

## 2019-02-08 MED ORDER — ZOLPIDEM TARTRATE 10 MG PO TABS: 10.0000 mg | ORAL_TABLET | Freq: Every day | ORAL | 0 refills | Status: DC

## 2019-02-08 NOTE — Telephone Encounter (Signed)
Refill Ambien x 90 days

## 2019-02-10 ENCOUNTER — Other Ambulatory Visit: Payer: Self-pay | Admitting: Internal Medicine

## 2019-02-10 MED ORDER — HYDROCODONE-ACETAMINOPHEN 10-325 MG PO TABS: 1.0000 | ORAL_TABLET | Freq: Four times a day (QID) | ORAL | 0 refills | Status: DC

## 2019-02-10 NOTE — Telephone Encounter (Signed)
Roberto Reid [phone]  [facility]  HYDROcodone-acetaminophen [facility]) 10-325 MG tablet   [name] called to say he needs a refill for above medication, he will be out on Monday.

## 2019-02-21 ENCOUNTER — Ambulatory Visit (INDEPENDENT_AMBULATORY_CARE_PROVIDER_SITE_OTHER): Payer: Medicare Other | Admitting: Internal Medicine

## 2019-02-21 ENCOUNTER — Encounter: Payer: Self-pay | Admitting: Internal Medicine

## 2019-02-21 ENCOUNTER — Other Ambulatory Visit: Payer: Self-pay

## 2019-02-21 VITALS — BP 130/70 | HR 86 | Temp 98.3°F | Ht 70.0 in | Wt 185.0 lb

## 2019-02-21 DIAGNOSIS — N529 Male erectile dysfunction, unspecified: Secondary | ICD-10-CM

## 2019-02-21 DIAGNOSIS — F5104 Psychophysiologic insomnia: Secondary | ICD-10-CM

## 2019-02-21 DIAGNOSIS — K219 Gastro-esophageal reflux disease without esophagitis: Secondary | ICD-10-CM | POA: Diagnosis not present

## 2019-02-21 DIAGNOSIS — E782 Mixed hyperlipidemia: Secondary | ICD-10-CM

## 2019-02-21 DIAGNOSIS — N183 Chronic kidney disease, stage 3 unspecified: Secondary | ICD-10-CM

## 2019-02-21 DIAGNOSIS — M79671 Pain in right foot: Secondary | ICD-10-CM | POA: Diagnosis not present

## 2019-02-21 DIAGNOSIS — E1169 Type 2 diabetes mellitus with other specified complication: Secondary | ICD-10-CM

## 2019-02-21 DIAGNOSIS — Z0000 Encounter for general adult medical examination without abnormal findings: Secondary | ICD-10-CM | POA: Diagnosis not present

## 2019-02-21 DIAGNOSIS — R7989 Other specified abnormal findings of blood chemistry: Secondary | ICD-10-CM

## 2019-02-21 DIAGNOSIS — Z7901 Long term (current) use of anticoagulants: Secondary | ICD-10-CM

## 2019-02-21 DIAGNOSIS — I10 Essential (primary) hypertension: Secondary | ICD-10-CM | POA: Diagnosis not present

## 2019-02-21 DIAGNOSIS — E119 Type 2 diabetes mellitus without complications: Secondary | ICD-10-CM | POA: Diagnosis not present

## 2019-02-21 DIAGNOSIS — G8929 Other chronic pain: Secondary | ICD-10-CM | POA: Diagnosis not present

## 2019-02-21 DIAGNOSIS — Z87442 Personal history of urinary calculi: Secondary | ICD-10-CM | POA: Diagnosis not present

## 2019-02-21 DIAGNOSIS — Z23 Encounter for immunization: Secondary | ICD-10-CM

## 2019-02-21 DIAGNOSIS — E8881 Metabolic syndrome: Secondary | ICD-10-CM

## 2019-02-21 DIAGNOSIS — Z86711 Personal history of pulmonary embolism: Secondary | ICD-10-CM | POA: Diagnosis not present

## 2019-02-21 LAB — POCT URINALYSIS DIPSTICK
Appearance: NEGATIVE
Bilirubin, UA: NEGATIVE
Blood, UA: NEGATIVE
Glucose, UA: NEGATIVE
Ketones, UA: NEGATIVE
Leukocytes, UA: NEGATIVE
Nitrite, UA: NEGATIVE
Odor: NEGATIVE
Protein, UA: NEGATIVE
Spec Grav, UA: 1.01 (ref 1.010–1.025)
Urobilinogen, UA: 0.2 E.U./dL
pH, UA: 6.5 (ref 5.0–8.0)

## 2019-02-21 NOTE — Progress Notes (Signed)
Subjective:    Patient ID: Roberto Reid, male    DOB: [birth year], 85 y.o.   MRN: [id number]  HPI 85 year old male in today for health maintenance exam and evaluation of medical issues.  He is seen here for chronic pain management every 3 months.  He takes hydrocodone APAP appropriately.  He has chronic foot pain and recently had right second  toe amputation due to osteomyelitis in April.  He has a severe bunion deformity of his right foot and had an ulcer of the second toe for many months.  MRI showed osteomyelitis of the distal phalanx right second toe.  Dr. Doran Durand performed amputation.  He has been doing well since then.  History of recurrent cellulitis right leg related to apparent spider bite in 2008 but likely due to venous insufficiency.  He has essential hypertension, controlled type 2 diabetes mellitus, low testosterone, erectile dysfunction.  History of left knee replacement 2013.  He had left lower lobe pneumonia in 2017 and developed shortness of breath.  He was hospitalized and found to have pulmonary embolus treated with Eliquis.  Has seen hematologist who advised him to continue with anticoagulation.  History of GE reflux treated with Nexium.  History of hyperlipidemia treated with Lipitor.  Issues with chronic insomnia treated with Ambien and Seroquel.  History of anxiety treated with as needed Klonopin.  Treated with AndroGel pump for low testosterone.  Hypertension treated with losartan.  History of cluster headaches seen by Dr. love in 2001.  History of Barrett's esophagus and adenomatous colon polyp diagnosed in 2008.  Right kidney stone in 2007 in 1997.  Surgery repair fracture arm 1942.  Kidney stone removed 1997.  Reported history of uric acid stones 2007 treated by Dr. Jeffie Pollock.  Cataract extraction both eyes November 2010.  Left knee surgery done by Dr. supple for torn cartilage November 2005.  Hospitalized in [place] September 2011 with right leg  cellulitis and treated with IV antibiotics.  In the spring 2016 while on a trip to [place] he suffered a fall down some marble steps in [place] after slipping.  He suffered a right quadriceps tendon rupture, right knee torn medial meniscus of complex repair by Dr. Lorre Nick in 2016.  He had considerable rehab and took some time to get over that injury.  Social history: He is married.  No children from second marriage.  Non-smoker but formally smoked and quit in 1990 having smoked a pack a day for 32 years.  Social alcohol consumption.  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  He has elevated serum creatinine of 1.60 but has not wanted to see Nephrology.  Had ultrasound of the kidney 2017.  There was suspicion for mass in the interpolar region of the right kidney.  He had MRI of the right kidney showing small bilateral renal cyst and no worrisome renal lesions.  Felt to have chronic kidney disease multifactorial including hypertension and diabetes mellitus.    Review of Systems history of chronic right foot pain which requires chronic narcotic pain medication.  He was referred to chronic pain management clinic.  They wanted him evaluated for sleep apnea which he did not want to do and he prefers not to see him and prescribe chronic pain medications.  Therefore he seen here every 3 months.     Objective:   Physical  Exam Blood pressure 130/70 pulse 86 temperature 98.3 weight 195 pounds BMI 26.54.  Skin warm and dry.  Nodes none.  He is alert and oriented x3.  PERRLA.  No focal deficits on brief neurological exam.  Chronic right foot deformity noted unchanged.  Chest clear to auscultation.  Cardiac exam regular rate and rhythm normal S1 and S2 without murmurs or gallops.  No carotid bruits.  Abdomen soft nondistended without hepatosplenomegaly masses or tenderness.  Prostate is normal. No  lower extremity edema.  Neuro intact without focal deficits.  Thought process and judgment as well as affect normal.      Assessment & Plan:  Chronic right foot deformity requiring chronic pain medication and every 3 month follow-up  Chronic insomnia-stable with Seroquel and Ambien  Bilateral hearing loss  Impaired glucose tolerance-treated with diet  Hyperlipidemia treated with statin medication  Metabolic syndrome  Low testosterone treated with testosterone replacement by urologist  GE reflux treated with PPI  History of Barrett's esophagus  History of adenomatous colon polyps  History of anxiety treated with benzodiazepine as needed  Erectile dysfunction treated with Cialis  History of kidney stones seen by Dr. Jeffie Pollock  History of pulmonary embolism on chronic Eliquis therapy.  Has seen a hematologist who advised chronic therapy.  Chronic kidney disease- had renal ultrasound and MRI of the kidney.  Does not want to see a nephrologist.  We will continue to follow.  Likely multifactorial due to hypertension and diabetes.  Plan: Continue current regimen and continue to see every 3 months for chronic pain management of foot pain.  Subjective:   Patient presents for Medicare Annual/Subsequent preventive examination.  Review Past Medical/Family/Social: See above  Risk Factors  Current exercise habits: Plays golf Dietary issues discussed: Low-fat low carbohydrate  Cardiac risk factors: Diabetes hyperlipidemia  Depression Screen  (Note: if answer to either of the following is "Yes", a more complete depression screening is indicated)   Over the past two weeks, have you felt down, depressed or hopeless? No  Over the past two weeks, have you felt little interest or pleasure in doing things? No Have you lost interest or pleasure in daily life? No Do you often feel hopeless? No Do you cry easily over simple problems? No   Activities of Daily Living  In your present  state of health, do you have any difficulty performing the following activities?:   Driving? No  Managing money? No  Feeding yourself? No  Getting from bed to chair? No  Climbing a flight of stairs? No  Preparing food and eating?: No  Bathing or showering? No  Getting dressed: No  Getting to the toilet? No  Using the toilet:No  Moving around from place to place: No  In the past year have you fallen or had a near fall?:No  Are you sexually active?  Yes Do you have more than one partner? No   Hearing Difficulties: No  Do you often ask people to speak up or repeat themselves? No  Do you experience ringing or noises in your ears? No  Do you have difficulty understanding soft or whispered voices? No  Do you feel that you have a problem with memory? No Do you often misplace items? No    Home Safety:  Do you have a smoke alarm at your residence? Yes Do you have grab bars in the bathroom?  No Do you have throw rugs in your house?  Yes   Cognitive Testing  Alert? Yes Normal Appearance?Yes  Oriented to person? Yes Place? Yes  Time? Yes  Recall of three objects? Yes  Can perform simple calculations? Yes  Displays appropriate judgment?Yes  Can read the correct time from a watch face?Yes   List the Names of Other Physician/Practitioners you currently use:  See referral list for the physicians patient is currently seeing.     Review of Systems: See above   Objective:     General appearance: Appears younger than stated age  Head: Normocephalic, without obvious abnormality, atraumatic  Eyes: conj clear, EOMi PEERLA  Ears: normal TM's and external ear canals both ears  Nose: Nares normal. Septum midline. Mucosa normal. No drainage or sinus tenderness.  Throat: lips, mucosa, and tongue normal; teeth and gums normal  Neck: no adenopathy, no carotid bruit, no JVD, supple, symmetrical, trachea midline and thyroid not enlarged, symmetric, no tenderness/mass/nodules  No CVA  tenderness.  Lungs: clear to auscultation bilaterally  Breasts: normal male Heart: regular rate and rhythm, S1, S2 normal, no murmur, click, rub or gallop  Abdomen: soft, non-tender; bowel sounds normal; no masses, no organomegaly  Musculoskeletal: ROM normal in all joints, no crepitus, no deformity, Normal muscle strengthen. Back  is symmetric, no curvature. Skin: Skin color, texture, turgor normal. No rashes or lesions  Lymph nodes: Cervical, supraclavicular, and axillary nodes normal.  Neurologic: CN 2 -12 Normal, Normal symmetric reflexes. Normal coordination and gait  Psych: Alert & Oriented x 3, Mood appear stable.    Assessment:    Annual wellness medicare exam   Plan:    During the course of the visit the patient was educated and counseled about appropriate screening and preventive services including:   Annual flu vaccine     Patient Instructions (the written plan) was given to the patient.  Medicare Attestation  I have personally reviewed:  The patient's medical and social history  Their use of alcohol, tobacco or illicit drugs  Their current medications and supplements  The patient's functional ability including ADLs,fall risks, home safety risks, cognitive, and hearing and visual impairment  Diet and physical activities  Evidence for depression or mood disorders  The patient's weight, height, BMI, and visual acuity have been recorded in the chart. I have made referrals, counseling, and provided education to the patient based on review of the above and I have provided the patient with a written personalized care plan for preventive services.

## 2019-02-22 LAB — COMPLETE METABOLIC PANEL WITH GFR
AG Ratio: 1.5 (calc) (ref 1.0–2.5)
ALT: 9 U/L (ref 9–46)
AST: 13 U/L (ref 10–35)
Albumin: 4 g/dL (ref 3.6–5.1)
Alkaline phosphatase (APISO): 37 U/L (ref 35–144)
BUN/Creatinine Ratio: 16 (calc) (ref 6–22)
BUN: 25 mg/dL (ref 7–25)
CO2: 27 mmol/L (ref 20–32)
Calcium: 9.2 mg/dL (ref 8.6–10.3)
Chloride: 107 mmol/L (ref 98–110)
Creat: 1.6 mg/dL — ABNORMAL HIGH (ref 0.70–1.11)
GFR, Est African American: 45 mL/min/{1.73_m2} — ABNORMAL LOW (ref >=60)
GFR, Est Non African American: 39 mL/min/{1.73_m2} — ABNORMAL LOW (ref >=60)
Globulin: 2.6 g/dL (calc) (ref 1.9–3.7)
Glucose, Bld: 97 mg/dL (ref 65–139)
Potassium: 4.3 mmol/L (ref 3.5–5.3)
Sodium: 143 mmol/L (ref 135–146)
Total Bilirubin: 1.3 mg/dL — ABNORMAL HIGH (ref 0.2–1.2)
Total Protein: 6.6 g/dL (ref 6.1–8.1)

## 2019-02-22 LAB — LIPID PANEL
Cholesterol: 143 mg/dL (ref <200)
HDL: 43 mg/dL (ref >=40)
LDL Cholesterol (Calc): 81 mg/dL (calc)
Non-HDL Cholesterol (Calc): 100 mg/dL (calc) (ref <130)
Total CHOL/HDL Ratio: 3.3 (calc) (ref <5.0)
Triglycerides: 98 mg/dL (ref <150)

## 2019-02-22 LAB — HEMOGLOBIN A1C
Hgb A1c MFr Bld: 5.1 % of total Hgb (ref <5.7)
Mean Plasma Glucose: 100 (calc)
eAG (mmol/L): 5.5 (calc)

## 2019-02-22 LAB — MICROALBUMIN / CREATININE URINE RATIO
Creatinine, Urine: 86 mg/dL (ref 20–320)
Microalb Creat Ratio: 7 mcg/mg creat (ref <30)
Microalb, Ur: 0.6 mg/dL

## 2019-02-22 LAB — PSA: PSA: 0.5 ng/mL (ref <=4.0)

## 2019-02-23 ENCOUNTER — Other Ambulatory Visit: Payer: Medicare Other | Admitting: Internal Medicine

## 2019-02-27 NOTE — Patient Instructions (Signed)
He will continue current medications.  He will be seen every 3 months for chronic pain management.  He takes his pain medication responsibly.

## 2019-03-13 ENCOUNTER — Other Ambulatory Visit: Payer: Self-pay

## 2019-03-13 MED ORDER — HYDROCODONE-ACETAMINOPHEN 10-325 MG PO TABS: 1.0000 | ORAL_TABLET | Freq: Four times a day (QID) | ORAL | 0 refills | Status: DC

## 2019-03-13 NOTE — Telephone Encounter (Signed)
Last refill 02/10/19. Patient called to request a refill.

## 2019-03-28 ENCOUNTER — Telehealth: Payer: Self-pay | Admitting: Internal Medicine

## 2019-03-28 MED ORDER — DOXYCYCLINE HYCLATE 100 MG PO TABS: 100.0000 mg | ORAL_TABLET | Freq: Two times a day (BID) | ORAL | 0 refills | Status: DC

## 2019-03-28 NOTE — Telephone Encounter (Signed)
Patient does not want Covid test. Call in Doxycycline 100 mg twice a day for 10 days

## 2019-03-28 NOTE — Telephone Encounter (Signed)
He said no.

## 2019-03-28 NOTE — Telephone Encounter (Signed)
Roberto Reid [phone]  Roberto called to say last night he started being hot and cold, low grade fever 99.0, shakes and nausea. Some better this morning but he would like for you to call in some Doxycycline to [facility]. He stated he likes to keep this on hand because he gets Sepsis from that spider bite from 10 years ago.

## 2019-03-28 NOTE — Telephone Encounter (Signed)
Does he want Covid test

## 2019-03-28 NOTE — Telephone Encounter (Signed)
Rx was sent  

## 2019-03-30 ENCOUNTER — Other Ambulatory Visit: Payer: Self-pay

## 2019-03-30 DIAGNOSIS — M79673 Pain in unspecified foot: Secondary | ICD-10-CM

## 2019-03-30 DIAGNOSIS — G8929 Other chronic pain: Secondary | ICD-10-CM

## 2019-03-30 MED ORDER — CLONAZEPAM 0.5 MG PO TABS: ORAL_TABLET | ORAL | 5 refills | Status: DC

## 2019-04-03 DIAGNOSIS — C44319 Basal cell carcinoma of skin of other parts of face: Secondary | ICD-10-CM | POA: Diagnosis not present

## 2019-04-04 ENCOUNTER — Ambulatory Visit (INDEPENDENT_AMBULATORY_CARE_PROVIDER_SITE_OTHER): Payer: Medicare Other | Admitting: Internal Medicine

## 2019-04-04 ENCOUNTER — Encounter: Payer: Self-pay | Admitting: Internal Medicine

## 2019-04-04 ENCOUNTER — Other Ambulatory Visit: Payer: Self-pay

## 2019-04-04 VITALS — BP 120/80 | HR 78 | Temp 98.3°F | Ht 70.0 in | Wt 189.0 lb

## 2019-04-04 DIAGNOSIS — M79671 Pain in right foot: Secondary | ICD-10-CM | POA: Diagnosis not present

## 2019-04-04 DIAGNOSIS — G8929 Other chronic pain: Secondary | ICD-10-CM

## 2019-04-04 NOTE — Progress Notes (Signed)
   Subjective:    Patient ID: Roberto Reid, male    DOB: 18-Jul-1932, 85 y.o.   MRN: [id number]  HPI 85 year old Male for pain management follow up.  He is seen generally every 3 months for chronic pain management here.  He went to the [place] pain [facility] center for a while but prefers to come here.  He is given hydrocodone APAP 10/325 which he takes every 6 hours for chronic leg and foot pain. This seems to work fairly well for him.  He takes Xanax for anxiety and Ambien and Seroquel for sleeping.  He has a history of impaired glucose tolerance and hypertension.  He takes his medications responsibly and keeps his appointments.  Urine drug screen obtained today.  He has no new complaints.  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  He has a chronic right foot deformity and has seen Dr. Para March at [facility] in the past.  His chronic foot deformity is the basis for his need for chronic pain medication.  He also had amputation in April of right second toe distal phalanx for osteomyelitis by Dr. Doran Durand.  He is on chronic anticoagulation for history of pulmonary embolism.   Review of Systems no new complaints     Objective:   Physical Exam Blood pressure 120/80, weight 189 pounds, BMI 27.12 pulse 78 and regular.  Afebrile.  Pulse oximetry 99%  Interviewed regarding chronic pain management and is doing okay       Assessment & Plan:  Chronic pain management regarding foot deformity-stable on current regimen  Essential hypertension-stable on current regimen  Chronic insomnia stable with current medications  History of anxiety  Plan: Medications can be refilled for an additional 3 months as urine drug screen has been reviewed.  Patient takes medications responsibly.

## 2019-04-09 LAB — PAIN MGMT, PROFILE 8 W/CONF, U
6 Acetylmorphine: NEGATIVE ng/mL
Alcohol Metabolites: POSITIVE ng/mL — AB (ref <500)
Alphahydroxyalprazolam: NEGATIVE ng/mL
Alphahydroxymidazolam: NEGATIVE ng/mL
Alphahydroxytriazolam: NEGATIVE ng/mL
Aminoclonazepam: 138 ng/mL
Amphetamines: NEGATIVE ng/mL
Benzodiazepines: POSITIVE ng/mL
Buprenorphine, Urine: NEGATIVE ng/mL
Cocaine Metabolite: NEGATIVE ng/mL
Codeine: NEGATIVE ng/mL
Creatinine: 117.1 mg/dL
Ethyl Glucuronide (ETG): 11894 ng/mL
Ethyl Sulfate (ETS): 2807 ng/mL
Hydrocodone: 3522 ng/mL
Hydromorphone: 1267 ng/mL
Hydroxyethylflurazepam: NEGATIVE ng/mL
Lorazepam: NEGATIVE ng/mL
MDMA: NEGATIVE ng/mL
Marijuana Metabolite: NEGATIVE ng/mL
Morphine: NEGATIVE ng/mL
Nordiazepam: NEGATIVE ng/mL
Norhydrocodone: 2189 ng/mL
Opiates: POSITIVE ng/mL
Oxazepam: NEGATIVE ng/mL
Oxidant: NEGATIVE ug/mL
Oxycodone: NEGATIVE ng/mL
Temazepam: NEGATIVE ng/mL
pH: 5 (ref 4.5–9.0)

## 2019-04-12 ENCOUNTER — Other Ambulatory Visit: Payer: Self-pay

## 2019-04-12 MED ORDER — HYDROCODONE-ACETAMINOPHEN 10-325 MG PO TABS: 1.0000 | ORAL_TABLET | Freq: Four times a day (QID) | ORAL | 0 refills | Status: DC

## 2019-04-29 NOTE — Patient Instructions (Signed)
Urine drug screen obtained and reviewed results.  Continue chronic pain medication as previously prescribed and follow-up in 3 months.

## 2019-05-11 ENCOUNTER — Other Ambulatory Visit: Payer: Self-pay | Admitting: Internal Medicine

## 2019-05-11 NOTE — Telephone Encounter (Signed)
Roberto Reid [phone]  Roberto called to say he will be out of his medication on Saturday, and he has also change pharmacies  HYDROcodone-acetaminophen ([place]) 10-325 MG tablet  [name] at [facility]

## 2019-05-11 NOTE — Telephone Encounter (Signed)
Will route tomorrow.

## 2019-05-12 MED ORDER — HYDROCODONE-ACETAMINOPHEN 10-325 MG PO TABS: 1.0000 | ORAL_TABLET | Freq: Four times a day (QID) | ORAL | 0 refills | Status: DC

## 2019-05-12 NOTE — Telephone Encounter (Signed)
Last refill 04/12/2019

## 2019-05-12 NOTE — Addendum Note (Signed)
Addended by: Mady Haagensen on: 05/12/2019 02:38 PM   Modules accepted: Orders

## 2019-05-15 ENCOUNTER — Other Ambulatory Visit: Payer: Self-pay | Admitting: Internal Medicine

## 2019-05-15 MED ORDER — ZOLPIDEM TARTRATE 10 MG PO TABS: 10.0000 mg | ORAL_TABLET | Freq: Every day | ORAL | 0 refills | Status: DC

## 2019-05-15 NOTE — Telephone Encounter (Signed)
[name] Grieder [phone]  [place]  zolpidem (AMBIEN) 10 MG tablet   [name] needs new refill sent to above pharmacy.

## 2019-05-24 ENCOUNTER — Other Ambulatory Visit: Payer: Self-pay

## 2019-05-24 MED ORDER — AZITHROMYCIN 250 MG PO TABS: ORAL_TABLET | ORAL | 0 refills | Status: DC

## 2019-05-29 ENCOUNTER — Other Ambulatory Visit: Payer: Self-pay

## 2019-05-29 MED ORDER — AMLODIPINE BESYLATE 5 MG PO TABS: 5.0000 mg | ORAL_TABLET | Freq: Every day | ORAL | 3 refills | Status: DC

## 2019-06-13 ENCOUNTER — Other Ambulatory Visit: Payer: Self-pay

## 2019-06-13 MED ORDER — HYDROCODONE-ACETAMINOPHEN 10-325 MG PO TABS: 1.0000 | ORAL_TABLET | Freq: Four times a day (QID) | ORAL | 0 refills | Status: DC

## 2019-06-13 NOTE — Telephone Encounter (Signed)
Last refill 05/12/19, last pain OV 04/04/19.

## 2019-06-15 ENCOUNTER — Ambulatory Visit: Payer: Medicare Other | Attending: Internal Medicine

## 2019-06-15 DIAGNOSIS — Z23 Encounter for immunization: Secondary | ICD-10-CM | POA: Insufficient documentation

## 2019-06-15 NOTE — Progress Notes (Signed)
   Covid-19 Vaccination Clinic  Name:  Roberto Reid    MRN: [id number] DOB: [birth year]  06/15/2019  [name] was observed post Covid-19 immunization for 30 minutes based on pre-vaccination screening without incidence. He was provided with Vaccine Information Sheet and instruction to access the V-Safe system.   [name] was instructed to call 911 with any severe reactions post vaccine: Marland Kitchen Difficulty breathing  . Swelling of your face and throat  . A fast heartbeat  . A bad rash all over your body  . Dizziness and weakness    Immunizations Administered    Name Date Dose VIS Date Route   Pfizer COVID-19 Vaccine 06/15/2019 11:11 AM 0.3 mL 05/12/2019 Intramuscular   Manufacturer: [employer], [employer]   Lot: [id number]   [place]: [address]-9

## 2019-07-03 DIAGNOSIS — Z0120 Encounter for dental examination and cleaning without abnormal findings: Secondary | ICD-10-CM | POA: Diagnosis not present

## 2019-07-05 DIAGNOSIS — Z0120 Encounter for dental examination and cleaning without abnormal findings: Secondary | ICD-10-CM | POA: Diagnosis not present

## 2019-07-06 ENCOUNTER — Telehealth: Payer: Self-pay

## 2019-07-06 ENCOUNTER — Ambulatory Visit: Payer: Medicare Other | Attending: Internal Medicine

## 2019-07-06 DIAGNOSIS — Z23 Encounter for immunization: Secondary | ICD-10-CM | POA: Insufficient documentation

## 2019-07-06 NOTE — Telephone Encounter (Signed)
Roberto Reid was notified.

## 2019-07-06 NOTE — Telephone Encounter (Signed)
Has to hold  Eliquis for 5 days before extraction and resume 24 hours after extraction.

## 2019-07-06 NOTE — Telephone Encounter (Signed)
Lilia Pro from dentist office called patient needs to have a tooth extraction and they want to know what are your recommendations on the [facility], before they can schedule an appointment.  Call back number [phone] [facility]

## 2019-07-06 NOTE — Progress Notes (Signed)
   Covid-19 Vaccination Clinic  Name:  KENTREL CLEVENGER    MRN: [id number] DOB: November 20, 1932  07/06/2019  Mr. Karen was observed post Covid-19 immunization for 15 minutes without incidence. He was provided with Vaccine Information Sheet and instruction to access the V-Safe system.   Mr. Juncaj was instructed to call 911 with any severe reactions post vaccine: Marland Kitchen Difficulty breathing  . Swelling of your face and throat  . A fast heartbeat  . A bad rash all over your body  . Dizziness and weakness    Immunizations Administered    Name Date Dose VIS Date Route   Pfizer COVID-19 Vaccine 07/06/2019  9:30 AM 0.3 mL 05/12/2019 Intramuscular   Manufacturer: [employer]   Lot: [id number]   [place]: [address]-3

## 2019-07-13 ENCOUNTER — Other Ambulatory Visit: Payer: Self-pay | Admitting: Internal Medicine

## 2019-07-13 MED ORDER — HYDROCODONE-ACETAMINOPHEN 10-325 MG PO TABS: 1.0000 | ORAL_TABLET | Freq: Four times a day (QID) | ORAL | 0 refills | Status: DC

## 2019-07-13 NOTE — Telephone Encounter (Signed)
[name] Coombs [phone]  HYDROcodone-acetaminophen [facility]) 10-325 MG tablet  [name] at [place], [place] Phone:  [phone]  Fax:  [phone]     [name] called to say that his above prescription runs out tomorrow.

## 2019-08-01 ENCOUNTER — Other Ambulatory Visit: Payer: Self-pay | Admitting: Internal Medicine

## 2019-08-01 NOTE — Telephone Encounter (Signed)
Refill once 

## 2019-08-01 NOTE — Telephone Encounter (Signed)
Called and spoke with Roberto Reid and he said he does not have anything going on, he just needs refill because he keeps prophylactic antibiotic with him all the time because he is subject to become Septic.

## 2019-08-01 NOTE — Telephone Encounter (Signed)
Please call pt and see why this is needed?

## 2019-08-07 ENCOUNTER — Other Ambulatory Visit: Payer: Self-pay | Admitting: Internal Medicine

## 2019-08-09 ENCOUNTER — Other Ambulatory Visit: Payer: Self-pay | Admitting: Internal Medicine

## 2019-08-09 NOTE — Telephone Encounter (Signed)
Roberto Reid [phone]  Roberto called to say he will be out of below medication tomorrow.  HYDROcodone-acetaminophen ([name]) 10-325 MG tablet  [name] at [place], [place] Phone:  [phone]  Fax:  [phone]

## 2019-08-10 MED ORDER — HYDROCODONE-ACETAMINOPHEN 10-325 MG PO TABS: 1.0000 | ORAL_TABLET | Freq: Four times a day (QID) | ORAL | 0 refills | Status: DC

## 2019-08-23 DIAGNOSIS — Z0120 Encounter for dental examination and cleaning without abnormal findings: Secondary | ICD-10-CM | POA: Diagnosis not present

## 2019-09-11 ENCOUNTER — Other Ambulatory Visit: Payer: Self-pay | Admitting: Internal Medicine

## 2019-09-11 MED ORDER — HYDROCODONE-ACETAMINOPHEN 10-325 MG PO TABS: 1.0000 | ORAL_TABLET | Freq: Four times a day (QID) | ORAL | 0 refills | Status: DC

## 2019-09-11 NOTE — Telephone Encounter (Signed)
[name] called to say his RX runs out tomorrow and he needs a refill.  Pharmacy -  [name] at [place], [place] Phone:  [phone]  Fax:  [phone]       Medication - HYDROcodone-acetaminophen ([place]) 10-325 MG tablet   Last Refill -   Last OV - 04/04/19  Last CPE - 02/21/19  Next Appointment -

## 2019-09-11 NOTE — Telephone Encounter (Signed)
Patient scheduled appt for tomorrow.

## 2019-09-11 NOTE — Telephone Encounter (Signed)
He is past due for pain management visit and urine drug screen. Needs visit before we refill

## 2019-09-12 ENCOUNTER — Other Ambulatory Visit: Payer: Self-pay

## 2019-09-12 ENCOUNTER — Ambulatory Visit: Payer: Medicare Other | Admitting: Internal Medicine

## 2019-09-12 ENCOUNTER — Encounter: Payer: Self-pay | Admitting: Internal Medicine

## 2019-09-12 VITALS — BP 130/80 | Ht 70.0 in | Wt 189.0 lb

## 2019-09-12 DIAGNOSIS — I10 Essential (primary) hypertension: Secondary | ICD-10-CM

## 2019-09-12 DIAGNOSIS — G8929 Other chronic pain: Secondary | ICD-10-CM

## 2019-09-12 DIAGNOSIS — E8881 Metabolic syndrome: Secondary | ICD-10-CM

## 2019-09-12 DIAGNOSIS — M79671 Pain in right foot: Secondary | ICD-10-CM | POA: Diagnosis not present

## 2019-09-12 DIAGNOSIS — Z7901 Long term (current) use of anticoagulants: Secondary | ICD-10-CM | POA: Diagnosis not present

## 2019-09-12 NOTE — Progress Notes (Signed)
   Subjective:    Patient ID: Roberto Reid, male    DOB: February 13, 1933, 86 y.o.   MRN: [id number]  HPI 86 year old Male seen for chronic pain management routinely.  He takes hydrocodone APAP appropriately.  He has chronic foot pain and had right second toe amputation due to osteomyelitis April 2020.  He has a severe bunion deformity of his right foot.  He had an ulcer of the second toe for many months.  MRI showed osteomyelitis of the distal phalanx right second toe.  Dr. He was performed amputation.  He has been doing well since that time but still has to take pain medication due to the bunion deformity.  He has essential hypertension, impaired glucose tolerance but hemoglobin A1c in September 2020 was 5.1%, history of total  left knee arthroplasty 2013.  Urine drug screen reviewed.  Positive for alcohol and hydrocodone.  He consumes alcohol regularly.  This does not appear to interfere with his pain medication.  History of recurrent cellulitis in the right leg and from time to time has to take antibiotics.  Currently doing well.  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  Not traveling by airplane at the present time due to the coronavirus pandemic.  He is seen in person today.  He is on chronic anticoagulation with history of pulmonary embolus  He has had 2 COVID-19 immunizations.  Has had pneumococcal immunizations.  Review of Systems see above-no new complaints     Objective:   Physical Exam Blood pressure 130/80, weight 189 pounds BMI 27.12 skin warm and dry.  No cellulitis of the right lower extremity.  Bunion deformity noted.  Chest is clear to auscultation.  Cardiac exam regular rate and rhythm normal S1 and S2 without murmurs or gallops.  No lower extremity edema.       Assessment & Plan:  Chronic pain management for right foot deformity-doing well on current dose of hydrocodone APAP.  He will follow-up here in July for pain management visit.  Plan: Urine  drug screen obtained today.  Medicare wellness visit and health maintenance exam due September 2021.

## 2019-09-27 NOTE — Patient Instructions (Signed)
Continue hydrocodone APAP as prescribed.  Follow-up with chronic pain management visit July 2021 and health maintenance exam and Medicare wellness visit with fasting labs September 2021.

## 2019-10-09 ENCOUNTER — Other Ambulatory Visit: Payer: Self-pay

## 2019-10-09 ENCOUNTER — Encounter: Payer: Self-pay | Admitting: Internal Medicine

## 2019-10-09 ENCOUNTER — Telehealth: Payer: Self-pay | Admitting: Internal Medicine

## 2019-10-09 ENCOUNTER — Ambulatory Visit: Payer: Medicare Other | Admitting: Internal Medicine

## 2019-10-09 VITALS — BP 120/80 | HR 86 | Temp 98.0°F | Ht 70.0 in | Wt 186.0 lb

## 2019-10-09 DIAGNOSIS — R8290 Unspecified abnormal findings in urine: Secondary | ICD-10-CM

## 2019-10-09 DIAGNOSIS — M545 Low back pain, unspecified: Secondary | ICD-10-CM

## 2019-10-09 LAB — POCT URINALYSIS DIPSTICK
Appearance: NEGATIVE
Bilirubin, UA: NEGATIVE
Glucose, UA: NEGATIVE
Ketones, UA: NEGATIVE
Nitrite, UA: NEGATIVE
Odor: NEGATIVE
Protein, UA: NEGATIVE
Spec Grav, UA: 1.015 (ref 1.010–1.025)
Urobilinogen, UA: 0.2 E.U./dL
pH, UA: 6.5 (ref 5.0–8.0)

## 2019-10-09 MED ORDER — PREDNISONE 10 MG PO TABS: ORAL_TABLET | ORAL | 0 refills | Status: DC

## 2019-10-09 NOTE — Telephone Encounter (Signed)
Pt said that works so I scheduled him at 20

## 2019-10-09 NOTE — Telephone Encounter (Signed)
Can he be here at 4 pm?

## 2019-10-09 NOTE — Telephone Encounter (Signed)
Pt calling wanting to schedule an appt for back pain, he said its been going on for about 3 weeks, lower left back. Is this ok to schedule?

## 2019-10-09 NOTE — Progress Notes (Signed)
   Subjective:    Patient ID: Roberto Reid, male    DOB: 1933-01-04, 86 y.o.   MRN: [id number]  HPI 86 year old Male presents with left lower back pain. Pain has been rather severe. Pain does not radiate into groin.  No nausea vomiting fever or chills.  No dysuria.  No hematuria.  He has been evaluated at [facility] urology in the past.  History of hemorrhagic cyst midpole of left kidney on MRI in 2017.  History of small bilateral renal cyst.  Ultrasound prior to the MRI in 2017 suggested 3.3 x 2.2 x 3.3 cm mass without hydronephrosis.  Patient had repeat renal ultrasound in September 2019 regarding left-sided flank pain for a month and no mass lesion or hydronephrosis was noted.  Cortical echogenicity identified likely representing a small stone.  Patient is on chronic pain management therapy for chronic right foot pain.  He had amputation of right second toe due to osteomyelitis April 2020.  He has a history of essential hypertension controlled type 2 diabetes mellitus, low testosterone, erectile dysfunction.  He is on chronic anticoagulation therapy for history of pulmonary embolus.  Despite taking hydrocodone APAP for foot pain he is still experiencing significant left lower back pain    Review of Systems see above-he plays golf and swings a golf club and has been trying to work out some.  May have injured his lower left back area with exercising     Objective:   Physical Exam Blood pressure 120/80 pulse 86 temperature 98 degrees pulse oximetry 98% weight 186 pounds height 5 feet 10 inches BMI 26.69  He has palpable point tenderness and firmness below left posterior rib cage area.  Area appears to be linear and significantly tender to moderate pressure.  Urine dipstick showed trace occult blood and trace leukocytes.  Culture was obtained and proved to be negative.  Urine cytology was sent and atypical urothelial cells are present     Assessment & Plan:  Left lower back pain with   palpable tenderness and firmness-this may be musculoskeletal pain/muscle spasm and I have placed him on a tapering course of prednisone starting with 60 mg decreasing by 10 mg daily over 6 days.  Consider physical therapy if not improving.  He is already on hydrocodone APAP for a foot problem and this is not controlling his pain.  Abnormal urine cytology-referral to [facility] for evaluation of atypical urothelial cells  Chronic pain management for foot deformity treated with Norco 10/325 every 6 hours.  Patient is compliant with his pain medication and does not abuse it and keeps regular appointments.  Urine drug screen obtained on a regular basis.  Time spent reviewing medical records, seeing patient, medical decision making and explanation of proposed treatment the patient is 30 minutes.  This includes telephone call regarding abnormal urine cytology.

## 2019-10-10 LAB — NON-GYN, SPECIMEN A

## 2019-10-10 LAB — URINALYSIS, MICROSCOPIC ONLY
Bacteria, UA: NONE SEEN /HPF
Hyaline Cast: NONE SEEN /LPF
RBC / HPF: NONE SEEN /HPF (ref 0–2)
Squamous Epithelial / HPF: NONE SEEN /HPF (ref <=5)
WBC, UA: NONE SEEN /HPF (ref 0–5)

## 2019-10-10 LAB — URINE CULTURE
MICRO NUMBER:: 10458646
SPECIMEN QUALITY:: ADEQUATE

## 2019-10-10 LAB — CYTOLOGY - NON PAP

## 2019-10-11 ENCOUNTER — Other Ambulatory Visit: Payer: Self-pay

## 2019-10-11 MED ORDER — HYDROCODONE-ACETAMINOPHEN 10-325 MG PO TABS: 1.0000 | ORAL_TABLET | Freq: Four times a day (QID) | ORAL | 0 refills | Status: DC

## 2019-10-11 NOTE — Telephone Encounter (Signed)
Patient called to request a refill on [place] last refill 09/11/19.

## 2019-10-12 ENCOUNTER — Encounter: Payer: Self-pay | Admitting: Internal Medicine

## 2019-10-12 NOTE — Patient Instructions (Addendum)
Recommend referral to [facility] for atypical urothelial cells seen on urine cytology.  Take prednisone in tapering course as directed for left lower back pain.

## 2019-10-13 ENCOUNTER — Telehealth: Payer: Self-pay | Admitting: Internal Medicine

## 2019-10-13 DIAGNOSIS — M545 Low back pain, unspecified: Secondary | ICD-10-CM

## 2019-10-13 MED ORDER — PREDNISONE 10 MG PO TABS: ORAL_TABLET | ORAL | 0 refills | Status: DC

## 2019-10-13 NOTE — Telephone Encounter (Signed)
Spoke with patient. He is aware of Urology referral which I faxed l;ast night. Refill tapering course of Prednisone please. He is better.

## 2019-10-13 NOTE — Telephone Encounter (Signed)
Pt called to give up date on back pain, he said it is doing better but that he would like to get a renewal of predniSONE (DELTASONE) 10 MG tablet, please advise

## 2019-10-18 DIAGNOSIS — E291 Testicular hypofunction: Secondary | ICD-10-CM | POA: Diagnosis not present

## 2019-10-28 ENCOUNTER — Other Ambulatory Visit: Payer: Self-pay | Admitting: Internal Medicine

## 2019-10-28 DIAGNOSIS — G8929 Other chronic pain: Secondary | ICD-10-CM

## 2019-10-29 ENCOUNTER — Other Ambulatory Visit: Payer: Self-pay | Admitting: Internal Medicine

## 2019-10-31 ENCOUNTER — Other Ambulatory Visit: Payer: Self-pay

## 2019-10-31 NOTE — Telephone Encounter (Signed)
Patient states that RX for Ambien was denied and he said he needs to go ahead and get that called in.  Patient states that this in nothing new and he needs his RX.  He is going out of town on Wednesday.  Patient can be contacted at [phone].

## 2019-10-31 NOTE — Telephone Encounter (Signed)
Please call him back and tell him you spoke with me. We sent in 90 tabs on March 8 therefor he should NOT be out this early. He is a week early that is why it was denied. Did he lose some tablets? This is a controlled substance.

## 2019-10-31 NOTE — Telephone Encounter (Signed)
Pend Ambien for tomorrow #90 tabs with no refill

## 2019-10-31 NOTE — Telephone Encounter (Signed)
Spoke with Dr Renold Genta and she states that she can send the medication in tomorrow.   He is not to be sharing his medication.  It is a controlled substance and she can get in trouble for that.  Patient notified of Dr Verlene Mayer recommendation.  States that he understands.  Patient also states that he did not know that it was controlled substance like Hydocodone.

## 2019-10-31 NOTE — Telephone Encounter (Signed)
Spoke with patient and he states that his wife had taken some of his medication and that is why is out early.   I explained to him that this is a controlled substance.  He states that he understands.

## 2019-11-02 MED ORDER — ZOLPIDEM TARTRATE 10 MG PO TABS: 10.0000 mg | ORAL_TABLET | Freq: Every day | ORAL | 0 refills | Status: DC

## 2019-11-02 NOTE — Addendum Note (Signed)
Addended by: Mady Haagensen on: 11/02/2019 10:47 AM   Modules accepted: Orders

## 2019-11-07 ENCOUNTER — Other Ambulatory Visit: Payer: Self-pay

## 2019-11-07 MED ORDER — LOSARTAN POTASSIUM 100 MG PO TABS: 100.0000 mg | ORAL_TABLET | Freq: Every day | ORAL | 1 refills | Status: DC

## 2019-11-10 ENCOUNTER — Other Ambulatory Visit: Payer: Self-pay | Admitting: Internal Medicine

## 2019-11-10 MED ORDER — HYDROCODONE-ACETAMINOPHEN 10-325 MG PO TABS: 1.0000 | ORAL_TABLET | Freq: Four times a day (QID) | ORAL | 0 refills | Status: DC

## 2019-11-10 NOTE — Telephone Encounter (Signed)
Roberto Reid [phone]  Roberto called to say he will run out of the below medication tomorrow.  HYDROcodone-acetaminophen ([place]) 10-325 MG tablet   [name] at [place], [place] Phone:  [phone]  Fax:  [phone]

## 2019-11-10 NOTE — Telephone Encounter (Signed)
Refill once as requested.

## 2019-12-05 DIAGNOSIS — H52223 Regular astigmatism, bilateral: Secondary | ICD-10-CM | POA: Diagnosis not present

## 2019-12-08 ENCOUNTER — Ambulatory Visit: Payer: Medicare Other | Admitting: Internal Medicine

## 2019-12-08 ENCOUNTER — Telehealth: Payer: Self-pay | Admitting: Internal Medicine

## 2019-12-08 MED ORDER — LEVOFLOXACIN 500 MG PO TABS
500.0000 mg | ORAL_TABLET | Freq: Every day | ORAL | 0 refills | Status: DC
Start: 2019-12-08 — End: 2020-02-23

## 2019-12-08 NOTE — Telephone Encounter (Signed)
He had an episode of cellulitis according to his wife. Please refill it once for 10 days.

## 2019-12-08 NOTE — Telephone Encounter (Signed)
Roberto Reid [phone]  Roberto called to say he needs a refill on his Leviquin and that you are aware of what he needs it for. I let him know he had an appointment at 11:45, he stated he was not aware, so I rescheduled for next week, but let him know he may need virtual visit to receive refill on this medication.

## 2019-12-11 ENCOUNTER — Other Ambulatory Visit: Payer: Self-pay | Admitting: Internal Medicine

## 2019-12-11 DIAGNOSIS — I824Y1 Acute embolism and thrombosis of unspecified deep veins of right proximal lower extremity: Secondary | ICD-10-CM

## 2019-12-11 DIAGNOSIS — I2782 Chronic pulmonary embolism: Secondary | ICD-10-CM

## 2019-12-11 DIAGNOSIS — N189 Chronic kidney disease, unspecified: Secondary | ICD-10-CM

## 2019-12-14 ENCOUNTER — Other Ambulatory Visit: Payer: Self-pay

## 2019-12-14 ENCOUNTER — Ambulatory Visit: Payer: Medicare Other | Admitting: Internal Medicine

## 2019-12-14 ENCOUNTER — Encounter: Payer: Self-pay | Admitting: Internal Medicine

## 2019-12-14 VITALS — BP 120/70 | HR 90 | Ht 70.0 in | Wt 187.0 lb

## 2019-12-14 DIAGNOSIS — Z86711 Personal history of pulmonary embolism: Secondary | ICD-10-CM

## 2019-12-14 DIAGNOSIS — L03115 Cellulitis of right lower limb: Secondary | ICD-10-CM

## 2019-12-14 DIAGNOSIS — Z7901 Long term (current) use of anticoagulants: Secondary | ICD-10-CM | POA: Diagnosis not present

## 2019-12-14 DIAGNOSIS — I10 Essential (primary) hypertension: Secondary | ICD-10-CM

## 2019-12-14 DIAGNOSIS — G8929 Other chronic pain: Secondary | ICD-10-CM

## 2019-12-14 DIAGNOSIS — F5104 Psychophysiologic insomnia: Secondary | ICD-10-CM

## 2019-12-14 DIAGNOSIS — E8881 Metabolic syndrome: Secondary | ICD-10-CM

## 2019-12-14 DIAGNOSIS — M79671 Pain in right foot: Secondary | ICD-10-CM

## 2019-12-14 DIAGNOSIS — M21961 Unspecified acquired deformity of right lower leg: Secondary | ICD-10-CM

## 2019-12-14 MED ORDER — HYDROCODONE-ACETAMINOPHEN 10-325 MG PO TABS: 1.0000 | ORAL_TABLET | Freq: Four times a day (QID) | ORAL | 0 refills | Status: DC

## 2019-12-14 MED ORDER — ONDANSETRON HCL 4 MG PO TABS: 4.0000 mg | ORAL_TABLET | Freq: Three times a day (TID) | ORAL | 1 refills | Status: DC | PRN

## 2019-12-24 NOTE — Progress Notes (Signed)
   Subjective:    Patient ID: Roberto Reid, male    DOB: [birth year], 86 y.o.   MRN: [id number]  HPI 86 year old Male here for follow-up on chronic pain management.  He recently had an issue with recurrent cellulitis of his right leg and was treated with Levaquin. He can feel the infection coming on and he has Levaquin on hand to take when this happens. This is been ongoing for a number of years. It is likely due to a small break in the skin and venous insufficiency that triggers the cellulitis. Sometimes he gets quite ill with this and runs a fever. His wife takes good care of him.  He has a history of chronic right foot pain and right second toe amputation due to osteomyelitis in April 2020. He has a severe bunion deformity of his right foot. He has to take pain medication due to the bunion deformity.  He has essential hypertension, impaired glucose tolerance and history of total left knee arthroplasty 2013.  He does consume alcohol socially. He takes hydrocodone APAP 10/325 every 6 hours for pain. This seems to work well for him. He is also on Eliquis 2.5 mg daily because of history of DVT. He takes Ambien nightly to sleep.  He has done well during the pandemic. He has had 2 pneumococcal vaccines in the past. He has had 2 Pfizer COVID-19 vaccine. Usually takes annual flu vaccine. Had Zostavax vaccine in 2010 but by my records has not had Shingrix vaccine.  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  Review of Systems no new complaints. He says his pain control is adequate at this point time with current medications.     Objective:   Physical Exam        Assessment & Plan:  Chronic pain management due to right foot deformity/bunion managed with hydrocodone/APAP 10/325 4 times daily. We referred him to pain management at one point and he did not like going there.  History of cellulitis right lower extremity that is  recurren  History of impaired glucose tolerance  History of hypertension treated with losartan and amlodipine  History of DVT treated with Eliquis  History of GE reflux treated with Nexium  History of anxiety treated with as needed Klonopin  History of insomnia treated with Ambien and Seroquel  History of impaired glucose tolerance treated with diet  Plan: We have made an appointment for him to have Medicare wellness and health maintenance exam in September. He will have urine drug screen at that time. Continue with current medications.

## 2019-12-30 NOTE — Patient Instructions (Signed)
Continue with current medications and follow-up here in September for Medicare wellness health maintenance exam and fasting labs in addition to urine drug screen.

## 2020-01-15 ENCOUNTER — Other Ambulatory Visit: Payer: Self-pay | Admitting: Internal Medicine

## 2020-01-15 MED ORDER — HYDROCODONE-ACETAMINOPHEN 10-325 MG PO TABS: 1.0000 | ORAL_TABLET | Freq: Four times a day (QID) | ORAL | 0 refills | Status: DC

## 2020-01-15 NOTE — Telephone Encounter (Signed)
Roberto Reid  [phone]  Roberto called to say he needs a refill, he ran out yesterday.   Pharmacy -  [name] at [place], [place] Phone:  [phone]  Fax:  [phone]       Medication - HYDROcodone-acetaminophen ([place]) 10-325 MG tablet   Last OV - 12/14/19  Last CPE - 02/10/19  Next Appointment - 02/23/20

## 2020-01-15 NOTE — Telephone Encounter (Signed)
Norco pended, please sign.

## 2020-01-15 NOTE — Telephone Encounter (Signed)
Refill once 

## 2020-01-28 ENCOUNTER — Other Ambulatory Visit: Payer: Self-pay | Admitting: Internal Medicine

## 2020-01-29 ENCOUNTER — Telehealth: Payer: Self-pay

## 2020-01-29 NOTE — Telephone Encounter (Signed)
I have called patient. He says wife does not remember borrowing Ambien from him. I do not think he should be out. He says he takes it as directed. He will need to wait until Sept 2 to refill.

## 2020-01-29 NOTE — Telephone Encounter (Signed)
Yes this was e-scribed yesterday I called patient to let him know about it. Then he called back saying that his pharmacy will not dispense medication until 9/02 bc it was too soon to refill patient stated that his wife could have used some of his Lorrin Mais and now he is out and he really needs some and he can not wait until Thursday to have it refill. He wanted me to ask you if we can call his pharmacy and have them refill early? Please advise.

## 2020-01-29 NOTE — Telephone Encounter (Signed)
Please check. I refilled this yesterday I thought.

## 2020-02-14 ENCOUNTER — Encounter: Payer: Self-pay | Admitting: Internal Medicine

## 2020-02-14 ENCOUNTER — Telehealth: Payer: Self-pay | Admitting: Internal Medicine

## 2020-02-14 MED ORDER — HYDROCODONE-ACETAMINOPHEN 10-325 MG PO TABS
1.0000 | ORAL_TABLET | Freq: Four times a day (QID) | ORAL | 0 refills | Status: DC
Start: 2020-02-14 — End: 2020-03-15

## 2020-02-14 NOTE — Telephone Encounter (Signed)
[name] Mccarney [phone]  [name] called to say he would be out of below medication tomorrow.  HYDROcodone-acetaminophen ([place]) 10-325 MG tablet  [name] at [place], [place] Phone:  [phone]  Fax:  [phone]      Last OV - 12/14/19 Last CPE -02/21/19  Next appointment 02/23/20

## 2020-02-14 NOTE — Telephone Encounter (Signed)
Refill Norco as requested. Keep appy for CPE soon. MJB,MD

## 2020-02-19 ENCOUNTER — Other Ambulatory Visit: Payer: Self-pay

## 2020-02-19 ENCOUNTER — Other Ambulatory Visit: Payer: Medicare Other | Admitting: Internal Medicine

## 2020-02-19 DIAGNOSIS — F5104 Psychophysiologic insomnia: Secondary | ICD-10-CM

## 2020-02-19 DIAGNOSIS — G8929 Other chronic pain: Secondary | ICD-10-CM

## 2020-02-19 DIAGNOSIS — I10 Essential (primary) hypertension: Secondary | ICD-10-CM | POA: Diagnosis not present

## 2020-02-19 DIAGNOSIS — Z86711 Personal history of pulmonary embolism: Secondary | ICD-10-CM

## 2020-02-19 DIAGNOSIS — Z7901 Long term (current) use of anticoagulants: Secondary | ICD-10-CM

## 2020-02-19 DIAGNOSIS — Z0000 Encounter for general adult medical examination without abnormal findings: Secondary | ICD-10-CM | POA: Diagnosis not present

## 2020-02-19 DIAGNOSIS — Z125 Encounter for screening for malignant neoplasm of prostate: Secondary | ICD-10-CM

## 2020-02-19 DIAGNOSIS — E8881 Metabolic syndrome: Secondary | ICD-10-CM

## 2020-02-19 DIAGNOSIS — M21961 Unspecified acquired deformity of right lower leg: Secondary | ICD-10-CM

## 2020-02-19 DIAGNOSIS — E1169 Type 2 diabetes mellitus with other specified complication: Secondary | ICD-10-CM

## 2020-02-19 DIAGNOSIS — M79671 Pain in right foot: Secondary | ICD-10-CM

## 2020-02-20 LAB — CBC WITH DIFFERENTIAL/PLATELET
Absolute Monocytes: 323 cells/uL (ref 200–950)
Basophils Absolute: 30 cells/uL (ref 0–200)
Basophils Relative: 0.7 %
Eosinophils Absolute: 129 cells/uL (ref 15–500)
Eosinophils Relative: 3 %
HCT: 39.8 % (ref 38.5–50.0)
Hemoglobin: 13.3 g/dL (ref 13.2–17.1)
Lymphs Abs: 1269 cells/uL (ref 850–3900)
MCH: 33.1 pg — ABNORMAL HIGH (ref 27.0–33.0)
MCHC: 33.4 g/dL (ref 32.0–36.0)
MCV: 99 fL (ref 80.0–100.0)
MPV: 11.1 fL (ref 7.5–12.5)
Monocytes Relative: 7.5 %
Neutro Abs: 2550 cells/uL (ref 1500–7800)
Neutrophils Relative %: 59.3 %
Platelets: 174 10*3/uL (ref 140–400)
RBC: 4.02 10*6/uL — ABNORMAL LOW (ref 4.20–5.80)
RDW: 12.6 % (ref 11.0–15.0)
Total Lymphocyte: 29.5 %
WBC: 4.3 10*3/uL (ref 3.8–10.8)

## 2020-02-20 LAB — COMPLETE METABOLIC PANEL WITH GFR
AG Ratio: 1.6 (calc) (ref 1.0–2.5)
ALT: 7 U/L — ABNORMAL LOW (ref 9–46)
AST: 11 U/L (ref 10–35)
Albumin: 3.8 g/dL (ref 3.6–5.1)
Alkaline phosphatase (APISO): 31 U/L — ABNORMAL LOW (ref 35–144)
BUN/Creatinine Ratio: 14 (calc) (ref 6–22)
BUN: 29 mg/dL — ABNORMAL HIGH (ref 7–25)
CO2: 27 mmol/L (ref 20–32)
Calcium: 8.3 mg/dL — ABNORMAL LOW (ref 8.6–10.3)
Chloride: 108 mmol/L (ref 98–110)
Creat: 2.05 mg/dL — ABNORMAL HIGH (ref 0.70–1.11)
GFR, Est African American: 33 mL/min/{1.73_m2} — ABNORMAL LOW (ref >=60)
GFR, Est Non African American: 29 mL/min/{1.73_m2} — ABNORMAL LOW (ref >=60)
Globulin: 2.4 g/dL (calc) (ref 1.9–3.7)
Glucose, Bld: 110 mg/dL — ABNORMAL HIGH (ref 65–99)
Potassium: 4.6 mmol/L (ref 3.5–5.3)
Sodium: 142 mmol/L (ref 135–146)
Total Bilirubin: 0.9 mg/dL (ref 0.2–1.2)
Total Protein: 6.2 g/dL (ref 6.1–8.1)

## 2020-02-20 LAB — LIPID PANEL
Cholesterol: 141 mg/dL (ref <200)
HDL: 42 mg/dL (ref >=40)
LDL Cholesterol (Calc): 81 mg/dL (calc)
Non-HDL Cholesterol (Calc): 99 mg/dL (calc) (ref <130)
Total CHOL/HDL Ratio: 3.4 (calc) (ref <5.0)
Triglycerides: 92 mg/dL (ref <150)

## 2020-02-20 LAB — HEMOGLOBIN A1C
Hgb A1c MFr Bld: 5.3 % of total Hgb (ref <5.7)
Mean Plasma Glucose: 105 (calc)
eAG (mmol/L): 5.8 (calc)

## 2020-02-20 LAB — PSA: PSA: 0.45 ng/mL (ref <=4.0)

## 2020-02-21 ENCOUNTER — Telehealth: Payer: Self-pay | Admitting: Internal Medicine

## 2020-02-21 DIAGNOSIS — Z0289 Encounter for other administrative examinations: Secondary | ICD-10-CM

## 2020-02-21 NOTE — Telephone Encounter (Signed)
Faxed Medical Records to [facility] for [employer] [phone] Pages)

## 2020-02-23 ENCOUNTER — Encounter: Payer: Self-pay | Admitting: Internal Medicine

## 2020-02-23 ENCOUNTER — Other Ambulatory Visit: Payer: Self-pay

## 2020-02-23 ENCOUNTER — Ambulatory Visit (INDEPENDENT_AMBULATORY_CARE_PROVIDER_SITE_OTHER): Payer: Medicare Other | Admitting: Internal Medicine

## 2020-02-23 VITALS — BP 120/70 | HR 83 | Ht 70.0 in | Wt 187.0 lb

## 2020-02-23 DIAGNOSIS — M79671 Pain in right foot: Secondary | ICD-10-CM

## 2020-02-23 DIAGNOSIS — M21961 Unspecified acquired deformity of right lower leg: Secondary | ICD-10-CM | POA: Diagnosis not present

## 2020-02-23 DIAGNOSIS — F5104 Psychophysiologic insomnia: Secondary | ICD-10-CM

## 2020-02-23 DIAGNOSIS — Z23 Encounter for immunization: Secondary | ICD-10-CM | POA: Diagnosis not present

## 2020-02-23 DIAGNOSIS — I10 Essential (primary) hypertension: Secondary | ICD-10-CM

## 2020-02-23 DIAGNOSIS — N529 Male erectile dysfunction, unspecified: Secondary | ICD-10-CM

## 2020-02-23 DIAGNOSIS — Z0000 Encounter for general adult medical examination without abnormal findings: Secondary | ICD-10-CM

## 2020-02-23 DIAGNOSIS — E119 Type 2 diabetes mellitus without complications: Secondary | ICD-10-CM

## 2020-02-23 DIAGNOSIS — Z87442 Personal history of urinary calculi: Secondary | ICD-10-CM

## 2020-02-23 DIAGNOSIS — E8881 Metabolic syndrome: Secondary | ICD-10-CM

## 2020-02-23 DIAGNOSIS — Z7901 Long term (current) use of anticoagulants: Secondary | ICD-10-CM

## 2020-02-23 DIAGNOSIS — G8929 Other chronic pain: Secondary | ICD-10-CM | POA: Diagnosis not present

## 2020-02-23 DIAGNOSIS — N1831 Chronic kidney disease, stage 3a: Secondary | ICD-10-CM

## 2020-02-23 DIAGNOSIS — Z86711 Personal history of pulmonary embolism: Secondary | ICD-10-CM

## 2020-02-23 LAB — POCT URINALYSIS DIPSTICK
Appearance: NEGATIVE
Bilirubin, UA: NEGATIVE
Blood, UA: NEGATIVE
Glucose, UA: NEGATIVE
Ketones, UA: NEGATIVE
Leukocytes, UA: NEGATIVE
Nitrite, UA: NEGATIVE
Odor: NEGATIVE
Protein, UA: NEGATIVE
Spec Grav, UA: 1.01 (ref 1.010–1.025)
Urobilinogen, UA: 0.2 E.U./dL
pH, UA: 6.5 (ref 5.0–8.0)

## 2020-02-23 NOTE — Progress Notes (Signed)
Subjective:    Patient ID: Roberto Reid, male    DOB: 06-09-1932, 86 y.o.   MRN: [id number]  HPI  86 year old Male seen for medicare wellness, health maintenance exam and evaluation of medical issues.  He is seen here for chronic pain management every 3 months.  He takes hydrocodone APAP appropriately.  He has chronic foot pain.  Had right second toe amputation in 2020 due to osteomyelitis.  He has a severe bunion deformity of his right foot.  He had an ulcer on his second toe for many months.  MRI showed osteomyelitis of the distal phalanx right second toe.  Dr. Doran Durand performed amputation.  He has been doing well since then.  He takes his pain medication responsibly.  He has regular urine drug screens.  History of recurrent cellulitis of his right leg related to an apparent spider bite in 2008 but likely due to venous insufficiency and minor trauma causing cellulitis.  He has a history of essential hypertension, controlled type 2 diabetes mellitus, low testosterone, erectile dysfunction.  History of left knee arthroplasty in 2013.  He had left lower lobe pneumonia in 2017 and developed shortness of breath.  He was hospitalized and was found to have a pulmonary embolus which was treated with Eliquis.  He saw hematologist who advised him to continue with anticoagulation indefinitely.  History of GE reflux treated with Nexium.  History of hyperlipidemia treated with Lipitor.  Issues with chronic insomnia treated with Ambien and Seroquel.  History of anxiety treated with as needed Klonopin.  Has been treated with AndroGel for low testosterone.  Hypertension has been treated with losartan and is stable.  History of cluster headaches seen by Dr. Leitha Bleak in 2001 but none recently.  History of Barrett's esophagus and adenomatous colon polyp diagnosed in 2008.  Right kidney stone in 1997 and in 2007.  History of uric acid stones 2007 treated by Dr. Jeffie Pollock.  Left knee surgery done by Dr.  Onnie Graham for torn cartilage November 2005.  Hospitalized in [place] September 2011 with right leg cellulitis treated with IV antibiotics.  In the spring 2016 while on trip to [place], he suffered a fall down some marble steps in [place].  He suffered a right quadriceps tendon rupture.  He also had right knee torn medial meniscus and had complex repair by Dr. Ronnie Derby in 2016.  He had considerable rehab and took some time to get over that injury.  He has an elevated serum creatinine but has declined Nephrology consultation.  Had ultrasound of the kidneys in 2017.  There was a suspicion for mass in the interpolar region of the right kidney.  He had MRI of the right kidney showing small bilateral renal cysts and no worrisome renal lesions.  Felt to have chronic kidney disease that was multifactorial including hypertension and diabetes mellitus.  He plays golf for exercise.    Review of Systems-  Chronic right foot pain-makes it painful to exercise such as playing golf but it is tolerable with hydrocodone APAP.   At one point we referred him to chronic pain management clinic.  They wanted him evaluated for sleep apnea which he did not want to do and he prefers that I prescribe his chronic pain medication.  Therefore he is seen here every 3 months and is compliant with his pain management medication.  No new complaints  He has had 2 Covid-19 vaccines.     Objective:   Physical Exam BP 120/70 pulse 83,  [name] 98% Weight 187 pounds, BMI 26.83  Skin warm and dry.  No cervical adenopathy.  No carotid bruits.  TMs are clear.  Neck is supple.  Chest is clear to auscultation.  Cardiac exam regular rate and rhythm normal S1 and S2 without murmurs or gallops.  Abdomen soft nondistended without hepatosplenomegaly masses or tenderness.  No lower extremity pitting edema but has chronic venous stasis changes of the lower extremities.  Has bunion deformity of right foot.  No ulcerations on feet.  Prostate is  normal.  Neuro intact without focal deficits.  Thought judgment and affect are normal.  He has lost about 8 pounds since last physical exam last year September 2020.       Assessment & Plan:  Chronic right foot deformity requiring chronic pain medication and follow-up every 3 months  Chronic insomnia stable with Seroquel and Ambien  Bilateral hearing loss  Impaired glucose tolerance treated with diet only and stable  Hyperlipidemia treated with statin medication  Metabolic syndrome  History of low testosterone treated with testosterone replacement by urologist  GE reflux treated with PPI  History of Barrett's esophagus  History of adenomatous colon polyps  History of anxiety treated with benzodiazepines as needed-likely related to work issues and stress  History of erectile dysfunction treated with Cialis  History of kidney stones in the remote past treated by Dr. Jeffie Pollock  History of pulmonary embolus on chronic Eliquis therapy.  He saw a hematologist to advise chronic therapy  Chronic kidney disease-has had renal ultrasound and MRI of the kidney.  Does not want to see nephrologist.  We will continue to follow.  Etiology is likely multifactorial due to hypertension and diabetes.  Plan: We will continue with current regimen and I will continue to see him every 3 months for chronic pain management.  No change in medications at this point in time.  Subjective:   Patient presents for Medicare Annual/Subsequent preventive examination.  Review Past Medical/Family/Social: See above   Risk Factors  Current exercise habits: Plays golf regularly Dietary issues discussed: Low-fat low carbohydrate discussed  Cardiac risk factors: Hyperlipidemia  Depression Screen  (Note: if answer to either of the following is "Yes", a more complete depression screening is indicated)   Over the past two weeks, have you felt down, depressed or hopeless? No  Over the past two weeks, have you  felt little interest or pleasure in doing things? No Have you lost interest or pleasure in daily life? No Do you often feel hopeless? No Do you cry easily over simple problems? No   Activities of Daily Living  In your present state of health, do you have any difficulty performing the following activities?:   Driving? No  Managing money? No  Feeding yourself? No  Getting from bed to chair? No  Climbing a flight of stairs? No  Preparing food and eating?: No  Bathing or showering? No  Getting dressed: No  Getting to the toilet? No  Using the toilet:No  Moving around from place to place: No  In the past year have you fallen or had a near fall?:  1 minor fall without injury Are you sexually active?  Yes Do you have more than one partner? No   Hearing Difficulties:  Do you often ask people to speak up or repeat themselves? No  Do you experience ringing or noises in your ears? No  Do you have difficulty understanding soft or whispered voices? No  Do you feel that you  have a problem with memory? No Do you often misplace items? No    Home Safety:  Do you have a smoke alarm at your residence? Yes Do you have grab bars in the bathroom?  No Do you have throw rugs in your house?   Cognitive Testing  Alert? Yes Normal Appearance?Yes  Oriented to person? Yes Place? Yes  Time? Yes  Recall of three objects? Yes  Can perform simple calculations? Yes  Displays appropriate judgment?Yes  Can read the correct time from a watch face?Yes   List the Names of Other Physician/Practitioners you currently use:  See referral list for the physicians patient is currently seeing.     Review of Systems: See above  Objective:     General appearance: Appears younger than stated age.  Head: Normocephalic, without obvious abnormality, atraumatic  Eyes: conj clear, EOMi PEERLA  Ears: normal TM's and external ear canals both ears  Nose: Nares normal. Septum midline. Mucosa normal. No drainage or  sinus tenderness.  Throat: lips, mucosa, and tongue normal; teeth and gums normal  Neck: no adenopathy, no carotid bruit, no JVD, supple, symmetrical, trachea midline and thyroid not enlarged, symmetric, no tenderness/mass/nodules  No CVA tenderness.  Lungs: clear to auscultation bilaterally  Breasts: normal appearance, no masses or tenderness Heart: regular rate and rhythm, S1, S2 normal, no murmur, click, rub or gallop  Abdomen: soft, non-tender; bowel sounds normal; no masses, no organomegaly  Musculoskeletal: ROM normal in all joints, no crepitus, chronic right foot deformity normal muscle strengthen. Back  is symmetric, no curvature. Skin: Skin color, texture, turgor normal. No rashes or lesions  Lymph nodes: Cervical, supraclavicular, and axillary nodes normal.  Neurologic: CN 2 -12 Normal, Normal symmetric reflexes. Normal coordination and gait  Psych: Alert & Oriented x 3, Mood appear stable.    Assessment:    Annual wellness medicare exam   Plan:    During the course of the visit the patient was educated and counseled about appropriate screening and preventive services including:   Annual PSA  Annual flu vaccine  Third Covid booster when available     Patient Instructions (the written plan) was given to the patient.  Medicare Attestation  I have personally reviewed:  The patient's medical and social history  Their use of alcohol, tobacco or illicit drugs  Their current medications and supplements  The patient's functional ability including ADLs,fall risks, home safety risks, cognitive, and hearing and visual impairment  Diet and physical activities  Evidence for depression or mood disorders  The patient's weight, height, BMI, and visual acuity have been recorded in the chart. I have made referrals, counseling, and provided education to the patient based on review of the above and I have provided the patient with a written personalized care plan for preventive  services.

## 2020-02-26 ENCOUNTER — Other Ambulatory Visit: Payer: Self-pay | Admitting: Internal Medicine

## 2020-02-26 DIAGNOSIS — I2782 Chronic pulmonary embolism: Secondary | ICD-10-CM

## 2020-02-26 DIAGNOSIS — I824Y1 Acute embolism and thrombosis of unspecified deep veins of right proximal lower extremity: Secondary | ICD-10-CM

## 2020-02-26 DIAGNOSIS — N189 Chronic kidney disease, unspecified: Secondary | ICD-10-CM

## 2020-03-04 ENCOUNTER — Other Ambulatory Visit: Payer: Self-pay | Admitting: Internal Medicine

## 2020-03-12 DIAGNOSIS — Z029 Encounter for administrative examinations, unspecified: Secondary | ICD-10-CM

## 2020-03-13 ENCOUNTER — Other Ambulatory Visit: Payer: Self-pay | Admitting: Internal Medicine

## 2020-03-14 ENCOUNTER — Telehealth: Payer: Self-pay | Admitting: Internal Medicine

## 2020-03-14 NOTE — Telephone Encounter (Signed)
Collected payment and faxed 238 pages of medical records to [phone]  Case [phone]

## 2020-03-15 ENCOUNTER — Telehealth: Payer: Self-pay | Admitting: Internal Medicine

## 2020-03-15 ENCOUNTER — Encounter: Payer: Self-pay | Admitting: Internal Medicine

## 2020-03-15 MED ORDER — HYDROCODONE-ACETAMINOPHEN 10-325 MG PO TABS
1.0000 | ORAL_TABLET | Freq: Four times a day (QID) | ORAL | 0 refills | Status: DC
Start: 2020-03-15 — End: 2020-04-15

## 2020-03-15 NOTE — Telephone Encounter (Signed)
Called patient to let him know system is down, he verbalized understanding, and Thanked me for letting him know.

## 2020-03-15 NOTE — Telephone Encounter (Signed)
[facility] electronic e-scribe has been down all day and just came back on-line. Have sent in Hydrocodone APAP as requested. MJB,MD

## 2020-03-15 NOTE — Telephone Encounter (Signed)
Our electronic Rx is down. Can't do this until repaired

## 2020-03-15 NOTE — Telephone Encounter (Signed)
[name] Pharo [phone]  [name] called to say he will run out of below medication tomorrow.  HYDROcodone-acetaminophen ([place]) 10-325 MG tablet  [name] at [place], [place] Phone:  [phone]  Fax:  [phone]

## 2020-03-15 NOTE — Telephone Encounter (Signed)
Called and let patient know

## 2020-03-24 ENCOUNTER — Encounter: Payer: Self-pay | Admitting: Internal Medicine

## 2020-03-24 NOTE — Patient Instructions (Addendum)
Continue current medications.  Have COVID-19 booster when available.  Have annual flu vaccine.  Follow-up every 3 months for chronic pain management.  No change in medications.  It was a pleasure to see you today. RTC December for pain management visit.

## 2020-04-15 ENCOUNTER — Other Ambulatory Visit: Payer: Self-pay | Admitting: Internal Medicine

## 2020-04-15 MED ORDER — HYDROCODONE-ACETAMINOPHEN 10-325 MG PO TABS
1.0000 | ORAL_TABLET | Freq: Four times a day (QID) | ORAL | 0 refills | Status: DC
Start: 2020-04-15 — End: 2020-05-14

## 2020-04-15 NOTE — Telephone Encounter (Signed)
[name] Maher [phone]  [name] called to say he needs refill on below medication.  HYDROcodone-acetaminophen ([place]) 10-325 MG tablet  [name] at [place], [place] Phone:  [phone]  Fax:  [phone]

## 2020-04-15 NOTE — Telephone Encounter (Signed)
Pended.

## 2020-04-15 NOTE — Telephone Encounter (Signed)
Pend please

## 2020-05-07 ENCOUNTER — Ambulatory Visit (INDEPENDENT_AMBULATORY_CARE_PROVIDER_SITE_OTHER): Payer: Medicare Other | Admitting: Internal Medicine

## 2020-05-07 ENCOUNTER — Telehealth: Payer: Self-pay | Admitting: Internal Medicine

## 2020-05-07 ENCOUNTER — Other Ambulatory Visit: Payer: Self-pay

## 2020-05-07 ENCOUNTER — Encounter: Payer: Self-pay | Admitting: Internal Medicine

## 2020-05-07 VITALS — BP 130/80 | HR 87 | Ht 70.0 in | Wt 187.0 lb

## 2020-05-07 DIAGNOSIS — N1831 Chronic kidney disease, stage 3a: Secondary | ICD-10-CM

## 2020-05-07 DIAGNOSIS — R202 Paresthesia of skin: Secondary | ICD-10-CM

## 2020-05-07 DIAGNOSIS — R200 Anesthesia of skin: Secondary | ICD-10-CM

## 2020-05-07 DIAGNOSIS — E119 Type 2 diabetes mellitus without complications: Secondary | ICD-10-CM | POA: Diagnosis not present

## 2020-05-07 LAB — POCT GLUCOSE (DEVICE FOR HOME USE): POC Glucose: 147 mg/dl — AB (ref 70–99)

## 2020-05-07 NOTE — Telephone Encounter (Signed)
After talking with Dr Renold Genta she wants patient to come in this afternoon. Scheduled appointment and advised to bring machine and strips along with readings

## 2020-05-07 NOTE — Telephone Encounter (Signed)
Will see today.  

## 2020-05-07 NOTE — Telephone Encounter (Signed)
Roberto Reid [phone]  Roberto called to say that yesterday no matter what he ate or drank he could not get his blood sugar above 60, he would drink some juice and then test 15 minutes later and it would still be below 60.

## 2020-05-08 LAB — COMPLETE METABOLIC PANEL WITH GFR
AG Ratio: 1.8 (calc) (ref 1.0–2.5)
ALT: 8 U/L — ABNORMAL LOW (ref 9–46)
AST: 8 U/L — ABNORMAL LOW (ref 10–35)
Albumin: 4.2 g/dL (ref 3.6–5.1)
Alkaline phosphatase (APISO): 42 U/L (ref 35–144)
BUN/Creatinine Ratio: 14 (calc) (ref 6–22)
BUN: 21 mg/dL (ref 7–25)
CO2: 28 mmol/L (ref 20–32)
Calcium: 8.8 mg/dL (ref 8.6–10.3)
Chloride: 103 mmol/L (ref 98–110)
Creat: 1.45 mg/dL — ABNORMAL HIGH (ref 0.70–1.11)
GFR, Est African American: 51 mL/min/{1.73_m2} — ABNORMAL LOW (ref >=60)
GFR, Est Non African American: 44 mL/min/{1.73_m2} — ABNORMAL LOW (ref >=60)
Globulin: 2.3 g/dL (calc) (ref 1.9–3.7)
Glucose, Bld: 113 mg/dL — ABNORMAL HIGH (ref 65–99)
Potassium: 4.7 mmol/L (ref 3.5–5.3)
Sodium: 138 mmol/L (ref 135–146)
Total Bilirubin: 0.8 mg/dL (ref 0.2–1.2)
Total Protein: 6.5 g/dL (ref 6.1–8.1)

## 2020-05-08 LAB — CBC WITH DIFFERENTIAL/PLATELET
Absolute Monocytes: 314 cells/uL (ref 200–950)
Basophils Absolute: 23 cells/uL (ref 0–200)
Basophils Relative: 0.4 %
Eosinophils Absolute: 120 cells/uL (ref 15–500)
Eosinophils Relative: 2.1 %
HCT: 39 % (ref 38.5–50.0)
Hemoglobin: 13.6 g/dL (ref 13.2–17.1)
Lymphs Abs: 1288 cells/uL (ref 850–3900)
MCH: 33.7 pg — ABNORMAL HIGH (ref 27.0–33.0)
MCHC: 34.9 g/dL (ref 32.0–36.0)
MCV: 96.5 fL (ref 80.0–100.0)
MPV: 11.7 fL (ref 7.5–12.5)
Monocytes Relative: 5.5 %
Neutro Abs: 3956 cells/uL (ref 1500–7800)
Neutrophils Relative %: 69.4 %
Platelets: 171 10*3/uL (ref 140–400)
RBC: 4.04 10*6/uL — ABNORMAL LOW (ref 4.20–5.80)
RDW: 12.5 % (ref 11.0–15.0)
Total Lymphocyte: 22.6 %
WBC: 5.7 10*3/uL (ref 3.8–10.8)

## 2020-05-08 LAB — SEDIMENTATION RATE: Sed Rate: 9 mm/h (ref 0–20)

## 2020-05-08 LAB — T4, FREE: Free T4: 1 ng/dL (ref 0.8–1.8)

## 2020-05-08 LAB — TSH: TSH: 3.41 mIU/L (ref 0.40–4.50)

## 2020-05-08 NOTE — Progress Notes (Signed)
Subjective:    Patient ID: Roberto Reid, male    DOB: [birth year], 86 y.o.   MRN: [id number]  HPI 86 year old Male seen today because he thought his glucose was running low. He brings in his meter which does not correlate with ours. Patient is alert and oriented and does not seem to have hypoglycemia. I think his Accucheck device is malfunctioning. Accucheck device at the office shows glucose to be within normal limits.  Patient is alert and oriented.    However, he indicates that he has been having some issues with left hand pain and numbness for couple of weeks.  He saw also says that the left side of his face is numb which is a new finding.  He has no issues with swallowing.  No facial weakness.  No dysarthria.  He is seen for chronic pain management here every 3 months and takes hydrocodone APAP appropriately.  He has chronic foot pain.  He had right second toe amputation in 2020 due to osteomyelitis.  He has a severe bunion deformity of his right foot.  He takes his pain medication responsibly and has regular urine drug screens through this office.  History of recurrent cellulitis of right leg which apparently started with a spider bite in 2008 but is likely due down to venous insufficiency.  Minor trauma seems to cause cellulitis.  History of essential hypertension, controlled type 2 diabetes mellitus, low testosterone, erectile dysfunction.  History of left knee arthroplasty in 2013.  He had left lower lobe pneumonia in 2017.  Developed shortness of breath.  He was hospitalized and was found to have a pulmonary embolus which was treated with Eliquis.  He saw hematologist who advised him to continue with anticoagulation indefinitely.  History of GE reflux treated with Nexium.  History of hyperlipidemia treated with Lipitor.  Issues with chronic insomnia treated with Ambien and Seroquel.  History of anxiety treated with as needed Klonopin.  Has been treated with AndroGel for low  testosterone.  History of Barrett's esophagus and adenomatous colon polyp diagnosed in 2008.  Right kidney stone 1997 and in 2007.  Uric acid stones in 2007 were treated by Dr. Jeffie Pollock.  Hospitalized in [place] September 2011 with right leg cellulitis treated with IV antibiotics.  In the spring 2016 while on a trip to [place] he fell down some marble steps since finding and suffered a right quadriceps tendon rupture.  He also had right knee torn medial meniscus and had complex repair by Dr. Ronnie Derby in 2016.  He has an elevated serum creatinine but has declined nephrology consultation.  MRI of the right kidney in 2017 showed small bilateral renal cyst and no worrisome lesions.  Felt to have chronic kidney disease that was multifactorial including hypertension and diabetes.  He plays golf for exercise.  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  He is married.  Does not smoke.  Social alcohol consumption.         Review of Systems  Neurological: Negative for dizziness, syncope, speech difficulty, light-headedness and headaches.   Has been having left hand numbness particularly worrisome at night for several weeks.  Sometimes left hand hurts.  Has also developed left facial numbness which is new.    Objective:   Physical Exam  Blood pressure 130/80 pulse 87 pulse oximetry 98% weight 187 pounds BMI 26.83 height 5 feet 10 inches.  Skin warm and dry.  Nodes none.  Decreased sensation left face.  Muscle strength in the left hand is normal but he reports decreased sensation in all fingers of the left hand.  His chest is clear.  Cardiac exam regular rate and rhythm.  He is able to ambulate without difficulty.  He is alert and oriented x3 with no dysarthria.      Assessment & Plan:  There is no evidence today of hypoglycemia.  I believe his device is malfunctioning.  We did draw CBC c-Met, free T4 TSH and sed rate.  His  glucose is 113.  Hemoglobin and white blood cell count as well as platelet count are normal.  He has chronic kidney disease stage IIIa with creatinine 1.45.  Plan: I would like for Neurology to evaluate his left facial and left hand numbness.  I have ordered an MRI/MRA to be done at [facility] [facility].  Neurology consultation placed.

## 2020-05-08 NOTE — Patient Instructions (Signed)
Neurology consultation for evaluation of left hand and left facial numbness.  MRI/MRA of the brain ordered.

## 2020-05-09 ENCOUNTER — Other Ambulatory Visit: Payer: Self-pay | Admitting: Internal Medicine

## 2020-05-09 DIAGNOSIS — N189 Chronic kidney disease, unspecified: Secondary | ICD-10-CM

## 2020-05-09 DIAGNOSIS — I2782 Chronic pulmonary embolism: Secondary | ICD-10-CM

## 2020-05-09 DIAGNOSIS — I824Y1 Acute embolism and thrombosis of unspecified deep veins of right proximal lower extremity: Secondary | ICD-10-CM

## 2020-05-10 ENCOUNTER — Ambulatory Visit
Admission: RE | Admit: 2020-05-10 | Discharge: 2020-05-10 | Disposition: A | Discharge status: Home / Self Care | Payer: Medicare Other | Source: Ambulatory Visit | Attending: Internal Medicine | Admitting: Internal Medicine

## 2020-05-10 ENCOUNTER — Other Ambulatory Visit: Payer: Self-pay

## 2020-05-10 DIAGNOSIS — I6503 Occlusion and stenosis of bilateral vertebral arteries: Secondary | ICD-10-CM | POA: Diagnosis not present

## 2020-05-10 DIAGNOSIS — R29818 Other symptoms and signs involving the nervous system: Secondary | ICD-10-CM | POA: Diagnosis not present

## 2020-05-10 DIAGNOSIS — R200 Anesthesia of skin: Secondary | ICD-10-CM | POA: Diagnosis not present

## 2020-05-10 DIAGNOSIS — I6603 Occlusion and stenosis of bilateral middle cerebral arteries: Secondary | ICD-10-CM | POA: Diagnosis not present

## 2020-05-14 ENCOUNTER — Telehealth: Payer: Self-pay | Admitting: Internal Medicine

## 2020-05-14 ENCOUNTER — Encounter: Payer: Self-pay | Admitting: Internal Medicine

## 2020-05-14 MED ORDER — HYDROCODONE-ACETAMINOPHEN 10-325 MG PO TABS: 1.0000 | ORAL_TABLET | Freq: Four times a day (QID) | ORAL | 0 refills | Status: DC

## 2020-05-14 NOTE — Telephone Encounter (Signed)
Roberto Reid [phone]  Roberto called to say his below prescription would be due on Thursday.   HYDROcodone-acetaminophen ([place]) 10-325 MG tablet  [name] at [place], [place] Phone:  [phone]  Fax:  [phone]

## 2020-05-14 NOTE — Telephone Encounter (Signed)
Patient requesting refill on Norco 10/325 which is due December 15. Have refilled for tomorrow as requested

## 2020-05-16 ENCOUNTER — Telehealth: Payer: Self-pay | Admitting: Internal Medicine

## 2020-05-16 DIAGNOSIS — R202 Paresthesia of skin: Secondary | ICD-10-CM

## 2020-05-16 DIAGNOSIS — R200 Anesthesia of skin: Secondary | ICD-10-CM

## 2020-05-16 NOTE — Telephone Encounter (Signed)
See if we can order nerve conduction studies on left hand through [facility]

## 2020-05-16 NOTE — Telephone Encounter (Signed)
Roberto Reid [phone]  Roberto called to say that he can not get in with neurologist until 07/22/2020, so he is asking if Orthopedic doctor would be alternative go to for his Carpel tunnel.

## 2020-05-17 NOTE — Telephone Encounter (Signed)
Nerve conduction study order placed.

## 2020-05-20 ENCOUNTER — Ambulatory Visit (INDEPENDENT_AMBULATORY_CARE_PROVIDER_SITE_OTHER): Payer: Medicare Other | Admitting: Internal Medicine

## 2020-05-20 ENCOUNTER — Encounter: Payer: Self-pay | Admitting: Internal Medicine

## 2020-05-20 ENCOUNTER — Other Ambulatory Visit: Payer: Self-pay

## 2020-05-20 VITALS — BP 130/80 | HR 77 | Wt 182.0 lb

## 2020-05-20 DIAGNOSIS — R202 Paresthesia of skin: Secondary | ICD-10-CM

## 2020-05-20 DIAGNOSIS — M21961 Unspecified acquired deformity of right lower leg: Secondary | ICD-10-CM

## 2020-05-20 DIAGNOSIS — R200 Anesthesia of skin: Secondary | ICD-10-CM | POA: Diagnosis not present

## 2020-05-20 DIAGNOSIS — M79671 Pain in right foot: Secondary | ICD-10-CM | POA: Diagnosis not present

## 2020-05-20 DIAGNOSIS — G8929 Other chronic pain: Secondary | ICD-10-CM

## 2020-05-20 NOTE — Patient Instructions (Addendum)
Urine drug screen obtained.  Nerve conduction studies ordered on left upper extremity.  Will likely need to see hand surgeon if it reveals that he has carpal tunnel syndrome.  Stroke has been ruled out with MRA of the brain.  No change in medications at this point in time.  Neurology consultation has been scheduled for February 2022.

## 2020-05-20 NOTE — Progress Notes (Signed)
Subjective:    Patient ID: Roberto Reid, male    DOB: Feb 16, 1933, 86 y.o.   MRN: [id number]  HPI 86 year old Male seen today for regular pain management visit but has recently had issues with left hand numbness.   He cannot get a Neurology appt until February. We will order nerve conduction studies in the interim. He thinks he may have carpal tunnel syndrome.   He had MRA of brain recently for left face and left hand numbness. Has moderate stenosis of V4 segment of the left vertebral aretery. No aneurysm. Moderate stenosis of distal right M1/MCA segment.  He is seen for chronic pain management every 3 months.  He takes codon with APAP 10/325 appropriately as prescribed.  He has chronic foot pain.  He had right second toe amputation in 2020 due to osteomyelitis.  He has a severe bunion deformity of his right foot.  Dr. Doran Reid performed amputation after MRI showed osteomyelitis of distal phalanx right second toe.  He has a history of recurrent cellulitis of his right leg related to an apparent spider bite in 2008 but also has venous insufficiency and minor trauma causing cellulitis.  He has a history of essential hypertension, controlled type 2 diabetes mellitus, history of left knee arthroplasty in 2013.  He had left lower lobe pneumonia in 2017.  He developed shortness of breath and was hospitalized.  Was found to have pulmonary embolus which was treated with Eliquis.  He saw hematologist who advised him to continue with anticoagulation indefinitely.  History of GE reflux treated with Nexium  Issues with chronic insomnia treated with Ambien and Seroquel.  History of anxiety treated with as needed Klonopin.  History of hyperlipidemia treated with Lipitor.  History of left knee surgery by Dr. Onnie Reid for torn cartilage in 2005.  Hospitalized in [place] September 2011 with right leg cellulitis treated with IV antibiotics.  Right quadriceps tendon rupture Spring 2016 while on trip to  [place].  Right knee torn medial meniscus and complex repair done by Dr. Ronnie Reid in 2016.  He has a history of elevated serum creatinine.  Ultrasound of the kidneys done in 2017 revealed suspicion for mass in the interpolar region of the right kidney.  MRI of the right kidney showed small bilateral renal cysts and no worrisome renal lesions.  Felt to have chronic kidney disease that is multifactorial including hypertension and diabetes mellitus.  He plays golf for exercise.  The only shoes that are comfortable or his golf shoes.   Social history: He is married.  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  He does Neurosurgeon work.  He does not smoke.  Social alcohol consumption.  He is a former Paramedic.      Review of Systems see above     Objective:   Physical Exam Persistent chronic right foot deformity.  No open lesions noted.  He takes good care of his feet.       Assessment & Plan:  Left arm and hand numbness-CVA ruled out.  Nerve conduction studies ordered to see if he has carpal tunnel syndrome.  Chronic pain management treated with hydrocodone APAP every 6 hours number 124 tablets.  He previously went to see chronic pain management physician.  They suggested evaluation for sleep apnea but he did not want to pursue that.  I have been prescribing his pain medication since that time and this seems to work out fairly well for  him.  Chronic insomnia treated with Seroquel and Ambien.  History of anxiety treated sparingly with Klonopin.  Plan: Nerve conduction studies ordered.  We will try to expedite these.  He will likely need to see hand surgeon regarding his hand numbness which is very bothersome to him at the present time.  Continue with pain management every 3 months and continue with current prescriptions.

## 2020-05-22 LAB — DRUG MONITORING, PANEL 8 WITH CONFIRMATION, URINE
6 Acetylmorphine: NEGATIVE ng/mL (ref <10)
Alcohol Metabolites: POSITIVE ng/mL — AB
Amphetamines: NEGATIVE ng/mL (ref <500)
Benzodiazepines: NEGATIVE ng/mL (ref <100)
Buprenorphine, Urine: NEGATIVE ng/mL (ref <5)
Cocaine Metabolite: NEGATIVE ng/mL (ref <150)
Codeine: NEGATIVE ng/mL (ref <50)
Creatinine: 23.4 mg/dL
Ethyl Glucuronide (ETG): 2275 ng/mL — ABNORMAL HIGH (ref <500)
Ethyl Sulfate (ETS): 399 ng/mL — ABNORMAL HIGH (ref <100)
Hydrocodone: 1016 ng/mL — ABNORMAL HIGH (ref <50)
Hydromorphone: 299 ng/mL — ABNORMAL HIGH (ref <50)
MDMA: NEGATIVE ng/mL (ref <500)
Marijuana Metabolite: NEGATIVE ng/mL (ref <20)
Morphine: NEGATIVE ng/mL (ref <50)
Norhydrocodone: 895 ng/mL — ABNORMAL HIGH (ref <50)
Opiates: POSITIVE ng/mL — AB (ref <100)
Oxidant: NEGATIVE ug/mL
Oxycodone: NEGATIVE ng/mL (ref <100)
pH: 5.4 (ref 4.5–9.0)

## 2020-05-22 LAB — DM TEMPLATE

## 2020-06-09 ENCOUNTER — Other Ambulatory Visit: Payer: Self-pay | Admitting: Internal Medicine

## 2020-06-13 ENCOUNTER — Other Ambulatory Visit: Payer: Self-pay | Admitting: Internal Medicine

## 2020-06-13 MED ORDER — HYDROCODONE-ACETAMINOPHEN 10-325 MG PO TABS: 1.0000 | ORAL_TABLET | Freq: Four times a day (QID) | ORAL | 0 refills | Status: DC

## 2020-06-13 NOTE — Telephone Encounter (Signed)
Roberto Reid [phone]  Roberto called to say he will need refill tomorrow on below medication.  HYDROcodone-acetaminophen ([place]) 10-325 MG tablet  [name] at [place], [place] Phone:  [phone]  Fax:  [phone]

## 2020-06-13 NOTE — Telephone Encounter (Signed)
Please pend this

## 2020-06-13 NOTE — Telephone Encounter (Signed)
Last refill 05/14/20. Pended.

## 2020-06-25 DIAGNOSIS — H532 Diplopia: Secondary | ICD-10-CM | POA: Diagnosis not present

## 2020-07-07 ENCOUNTER — Other Ambulatory Visit: Payer: Self-pay | Admitting: Internal Medicine

## 2020-07-07 DIAGNOSIS — I824Y1 Acute embolism and thrombosis of unspecified deep veins of right proximal lower extremity: Secondary | ICD-10-CM

## 2020-07-07 DIAGNOSIS — I2782 Chronic pulmonary embolism: Secondary | ICD-10-CM

## 2020-07-07 DIAGNOSIS — N189 Chronic kidney disease, unspecified: Secondary | ICD-10-CM

## 2020-07-10 DIAGNOSIS — H524 Presbyopia: Secondary | ICD-10-CM | POA: Diagnosis not present

## 2020-07-10 DIAGNOSIS — R69 Illness, unspecified: Secondary | ICD-10-CM | POA: Diagnosis not present

## 2020-07-10 DIAGNOSIS — H532 Diplopia: Secondary | ICD-10-CM | POA: Diagnosis not present

## 2020-07-12 ENCOUNTER — Other Ambulatory Visit: Payer: Self-pay | Admitting: Internal Medicine

## 2020-07-12 MED ORDER — HYDROCODONE-ACETAMINOPHEN 10-325 MG PO TABS: 1.0000 | ORAL_TABLET | Freq: Four times a day (QID) | ORAL | 0 refills | Status: DC

## 2020-07-12 NOTE — Telephone Encounter (Signed)
Note added, rx pended ready to sign.

## 2020-07-12 NOTE — Telephone Encounter (Signed)
Put note in to be filled Feb 13

## 2020-07-12 NOTE — Telephone Encounter (Signed)
Roberto Reid [phone]  Roberto called to say he will need a refill tomorrow on below medication.  HYDROcodone-acetaminophen ([name]) 10-325 MG tablet  [name] at [place], [place] Phone:  [phone]  Fax:  [phone]

## 2020-07-22 ENCOUNTER — Other Ambulatory Visit: Payer: Self-pay

## 2020-07-22 ENCOUNTER — Encounter: Payer: Self-pay | Admitting: Neurology

## 2020-07-22 ENCOUNTER — Ambulatory Visit: Payer: Medicare Other | Admitting: Neurology

## 2020-07-22 VITALS — BP 150/72 | HR 70 | Ht 70.0 in | Wt 183.0 lb

## 2020-07-22 DIAGNOSIS — R29898 Other symptoms and signs involving the musculoskeletal system: Secondary | ICD-10-CM | POA: Diagnosis not present

## 2020-07-22 NOTE — Progress Notes (Signed)
[facility]    Provider:  Dr Jaynee Eagles Requesting Provider: Renold Genta Cresenciano Lick, MD Primary Care Provider:  Elby Showers, MD  CC:  Numbness in left hand  HPI:  Roberto Reid is a 87 y.o. male here as requested by Elby Showers, MD for numbness of left hand and face. Past medical history hypertension, hyperlipidemia, kidney stones, hearing loss, DVT on chronic anticoagulation, arterial embolism of upper extremity, diabetes mellitus, asthma and arthritis.  Patient is here alone, he states he has numbness of the left hand and occasionally minor tingling on the left side of the head, left hand numbness started about 4 months ago, gotten a little better, there was no injury, he just woke up with it, he cannot completely close his fist, and he has dropped things. The left hand numbness "came out of nowhere" about 4 months ago, numbness of the thumb and other fingers, not th pinky, worse at night, goes all the way up the arm, he can;t make a fist and he has lost strength, no neck pain or radicular pain, mostly at night, changing positions makes it better. Awakenings in the middle of the night with pain and numbness and tingling like falling asleep. He has a tingling his scalp. Not really  related. It starts in the back of the head and sometimes it shoot sup and tingles, ongoing for years, so mild and occassional. No other focal neurologic deficits, associated symptoms, inciting events or modifiable factors.   Reviewed notes, labs and imaging from outside physicians, which showed:  I reviewed Dr. Verlene Mayer notes, he is seen for chronic management every 3 months and takes hydrocodone appropriately, chronic foot pain, had a right second toe amputation in 2020 due to osteomyelitis, severe bunion deformity of his right foot, however at last appointment he indicated that he been having some issues with left hand pain and numbness for several weeks, left side of his face is numb which is a new  finding, no issues with swallowing, no facial weakness, no dysarthria. Dr. Renold Genta stated he may have carpal tunnel syndrome, nerve conduction studies were ordered, he had an MRI brain recently for left face and left hand numbness, he has moderate stenosis of the V4 segment of the left vertebral artery, no aneurysm, moderate stenosis of the distal right M1/MCA segment.  CT head 11/01/2018: IMPRESSION:personally reviewed images and agree Small left parietal scalp hematoma. Mild diffuse cortical atrophy. Mild chronic ischemic white matter disease. No acute intracranial abnormality seen.  MRA head 05/10/2020: IMPRESSION: personally reviewed and agree moderate stenosis of the V4 segment of the left vertebral artery and distal right M1/MCA segment.  Review of Systems: Patient complains of symptoms per HPI as well as the following symptoms: left hand numbness. Pertinent negatives and positives per HPI. All others negative.   Social History   Socioeconomic History  . Marital status: Married    Spouse name: Not on file  . Number of children: 2  . Years of education: plus some graduate work  . Highest education level: Bachelor's degree (e.g., BA, AB, BS)  Occupational History  . Not on file  Tobacco Use  . Smoking status: Former Smoker    Packs/day: 0.75    Years: 37.00    Pack years: 27.75    Types: Cigarettes    Quit date: 12/14/1991    Years since quitting: 28.6  . Smokeless tobacco: Never Used  Vaping Use  . Vaping Use: Never used  Substance and Sexual Activity  .  Alcohol use: Yes    Alcohol/week: 7.0 - 10.0 standard drinks    Types: 7 - 10 Shots of liquor per week    Comment: "maybe a little higher than that"  . Drug use: No  . Sexual activity: Yes  Other Topics Concern  . Not on file  Social History Narrative   Lives at home with wife   Right handed   Caffeine: 2 cups/day   Social Determinants of Health   Financial Resource Strain: Not on file  Food Insecurity: Not on  file  Transportation Needs: Not on file  Physical Activity: Not on file  Stress: Not on file  Social Connections: Not on file  Intimate Partner Violence: Not on file    Family History  Problem Relation Age of Onset  . Parkinson's disease Father     Past Medical History:  Diagnosis Date  . Arthritis    "right hand" (06/15/2016)  . Childhood asthma   . Chronic anticoagulation 08/30/2017  . DVT, lower extremity, proximal, acute, right ([place]) 08/24/2016   06/15/16  . GERD (gastroesophageal reflux disease)   . Hearing loss   . History of kidney stones   . Hyperlipidemia   . Hypertension   . Pneumonia    "I've had it ~ 9 times as an adult; last time was 04/2016" (06/15/2016)    Patient Active Problem List   Diagnosis Date Noted  . Skin ulcer of second toe of right foot with fat layer exposed ([place]) 06/13/2018  . History of pulmonary embolism 09/23/2017  . Foot deformity, acquired, right 09/23/2017  . Recurrent cellulitis of lower extremity 09/23/2017  . Insomnia 09/23/2017  . Encounter for chronic pain management 09/23/2017  . Panic disorder 09/23/2017  . Chronic kidney disease, stage IV (severe) ([place]) 09/23/2017  . Controlled type 2 diabetes mellitus without complication, without long-term current use of insulin ([place]) 09/23/2017  . Chronic anticoagulation 08/30/2017  . DVT, lower extremity, proximal, acute, right ([place]) 08/24/2016  . Arterial embolism of upper extremity ([place]) 06/04/2016  . Failure of joint fusion ([place]) 11/06/2015  . Acquired pes planovalgus, right 11/06/2015  . Midfoot collapse, right 11/06/2015  . Mallet toe of right foot 11/06/2015  . Gonalgia 09/27/2014  . Atopic dermatitis 11/25/2012  . H/O total knee replacement 07/26/2012  . Hypertension 02/26/2011  . Hyperlipidemia 02/26/2011  . Osteoarthritis 02/26/2011  . GE reflux 02/26/2011  . Erectile dysfunction 02/26/2011  . Impotence 02/26/2011    Past Surgical History:  Procedure Laterality Date  .  AMPUTATION TOE Right 09/27/2018   Procedure: Right 2nd toe amputation;  Surgeon: Wylene Simmer, MD;  Location: [facility];  Service: Orthopedics;  Laterality: Right;  18min  . CATARACT EXTRACTION W/ INTRAOCULAR LENS  IMPLANT, BILATERAL Bilateral 04/2009  . ELBOW BURSA SURGERY Right   . FOOT SURGERY Right 2010   "attempted reconstruction" per pt  . JOINT REPLACEMENT    . KIDNEY STONE SURGERY    . KNEE ARTHROSCOPY Right 10/08/2014   Procedure: ARTHROSCOPY KNEE;  Surgeon: Vickey Huger, MD;  Location: [facility];  Service: Orthopedics;  Laterality: Right;  . KNEE ARTHROSCOPY Left    repair torn knee cartilage  . QUADRICEPS TENDON REPAIR Right 10/08/2014   Procedure: REPAIR QUADRICEP TENDON;  Surgeon: Vickey Huger, MD;  Location: [facility];  Service: Orthopedics;  Laterality: Right;  . TONSILLECTOMY    . TOTAL KNEE ARTHROPLASTY  12/14/2011   Procedure: TOTAL KNEE ARTHROPLASTY;  Surgeon: Rudean Haskell, MD;  Location: [facility];  Service: Orthopedics;  Laterality: Left;    Current Outpatient Medications  Medication Sig Dispense Refill  . amLODipine (NORVASC) 5 MG tablet Take 1 tablet (5 mg total) by mouth daily. 90 tablet 3  . clonazePAM (KLONOPIN) 0.5 MG tablet TAKE 1-2 TABLETS BY MOUTH DAILY AS NEEDED FOR ANXIETY 60 tablet 3  . Coenzyme Q10 (CO Q-10 PO) Take 1 tablet by mouth daily. 300 mg    . ELIQUIS 2.5 MG TABS tablet TAKE ONE TABLET BY MOUTH TWICE A DAY 60 tablet 1  . esomeprazole (NEXIUM) 40 MG capsule Take 1 capsule (40 mg total) by mouth 2 (two) times daily. 60 capsule 11  . HYDROcodone-acetaminophen (NORCO) 10-325 MG tablet Take 1 tablet by mouth every 6 (six) hours. 124 tablet 0  . loratadine (CLARITIN) 10 MG tablet Take 10 mg by mouth daily as needed for allergies.    Marland Kitchen losartan (COZAAR) 100 MG tablet TAKE ONE TABLET BY MOUTH DAILY 90 tablet 1  . lubiprostone (AMITIZA) 24 MCG capsule Take 1 capsule (24 mcg total) by mouth 2 (two) times daily with a meal. 60 capsule 11  . Multiple  Vitamins-Minerals (MULTIVITAMIN WITH MINERALS) tablet Take 1 tablet by mouth daily.    . ondansetron (ZOFRAN) 4 MG tablet Take 1 tablet (4 mg total) by mouth every 8 (eight) hours as needed for nausea or vomiting. 20 tablet 1  . QUEtiapine (SEROQUEL) 50 MG tablet TAKE ONE TABLET BY MOUTH EVERY NIGHT AT BEDTIME 90 tablet 1  . Testosterone 20.25 MG/ACT (1.62%) GEL 20.25 mg by Pump Prime route daily. Apply 6 pumps everyday as directed.    . zolpidem (AMBIEN) 10 MG tablet TAKE ONE TABLET BY MOUTH AT BEDTIME 90 tablet 1   No current facility-administered medications for this visit.    Allergies as of 07/22/2020 - Review Complete 07/22/2020  Allergen Reaction Noted  . Oxycodone-acetaminophen Other (See Comments) 01/29/2015  . Percocet [oxycodone-acetaminophen]  03/20/2011  . Sulfa antibiotics  02/18/2011  . Sulfamethoxazole Rash 01/29/2015    Vitals: BP (!) 150/72 (BP Location: Right Arm, Patient Position: Sitting)   Pulse 70   Ht 5\' 10"  (1.778 m)   Wt 183 lb (83 kg)   BMI 26.26 kg/m  Last Weight:  Wt Readings from Last 1 Encounters:  07/22/20 183 lb (83 kg)   Last Height:   Ht Readings from Last 1 Encounters:  07/22/20 5\' 10"  (1.778 m)     Physical exam: Exam: Gen: NAD, conversant, well nourised, well groomed                     CV: RRR, no MRG. No Carotid Bruits. No peripheral edema, warm, nontender Eyes: Conjunctivae clear without exudates or hemorrhage  Neuro: Detailed Neurologic Exam  Speech:    Speech is normal; fluent and spontaneous with normal comprehension.  Cognition:    The patient is oriented to person, place, and time;     recent and remote memory intact;     language fluent;     normal attention, concentration,     fund of knowledge Cranial Nerves:    The pupils are equal, round, and reactive to light. Pupils too small to visualize fundi. Visual fields are full to finger confrontation. Extraocular movements are intact. Trigeminal sensation is intact and  the muscles of mastication are normal. The face is symmetric. The palate elevates in the midline. Hearing intact. Voice is normal. Shoulder shrug is normal. The tongue has normal motion without fasciculations.   Coordination:  Normal finger to nose   Gait:    Not ataxic  Motor Observation:    No asymmetry, right thenar eminence atrophy left > right., and no involuntary movements noted. Tone:    Normal muscle tone.    Posture:    Posture is normal. normal erect    Strength: weak grip and opponens left > right. Otherwise trength is V/V in the upper and lower limbs.      Sensation: intact to LT     Reflex Exam:  DTR's:    Deep tendon reflexes in the upper and lower extremities are normal bilaterally.    Toes: right toe amputation. downgoing.   Clonus:    Clonus is absent.    Assessment/Plan: 87 year old male with left hand sensory changes in the median nerve distribution with nocturnal and morning predominance, weakness in grip and opponens pollicis of the left greater than right, and atrophy of the left thenar eminence.  We will try to get him in for EMG nerve conduction study as soon as possible as it appears he is losing muscle mass in the left hand.  Emg/ncs : bilateral.  Scheduled for tomorrow morning July 23, 2020.   Orders Placed This Encounter  Procedures  . NCV with EMG(electromyography)     Cc: Elby Showers, MD,  [name], Cresenciano Lick, MD  Sarina Ill, MD  [facility] [address] [place], [place] [address]  Phone [phone] Fax [phone]

## 2020-07-22 NOTE — Progress Notes (Signed)
Many thanks. 

## 2020-07-22 NOTE — Patient Instructions (Addendum)
Electromyoneurogram Electromyoneurogram is a test to check how well your muscles and nerves are working. This procedure includes the combined use of electromyogram (EMG) and nerve conduction study (NCS). EMG is used to look for muscular disorders. NCS, which is also called electroneurogram, measures how well your nerves are controlling your muscles. The procedures are usually done together to check if your muscles and nerves are healthy. If the results of the tests are abnormal, this may indicate disease or injury, such as a neuromuscular disease or peripheral nerve damage. Tell a health care provider about:  Any allergies you have.  All medicines you are taking, including vitamins, herbs, eye drops, creams, and over-the-counter medicines.  Any problems you or family members have had with anesthetic medicines.  Any blood disorders you have.  Any surgeries you have had.  Any medical conditions you have.  If you have a pacemaker.  Whether you are pregnant or may be pregnant. What are the risks? Generally, this is a safe procedure. However, problems may occur, including:  Infection where the electrodes were inserted.  Bleeding. What happens before the procedure? Medicines Ask your health care provider about:  Changing or stopping your regular medicines. This is especially important if you are taking diabetes medicines or blood thinners.  Taking medicines such as aspirin and ibuprofen. These medicines can thin your blood. Do not take these medicines unless your health care provider tells you to take them.  Taking over-the-counter medicines, vitamins, herbs, and supplements. General instructions  Your health care provider may ask you to avoid: ? Beverages that have caffeine, such as coffee and tea. ? Any products that contain nicotine or tobacco. These products include cigarettes, e-cigarettes, and chewing tobacco. If you need help quitting, ask your health care provider.  Do  not use lotions or creams on the same day that you will be having the procedure. What happens during the procedure? For EMG  Your health care provider will ask you to stay in a position so that he or she can access the muscle that will be studied. You may be standing, sitting, or lying down.  You may be given a medicine that numbs the area (local anesthetic).  A very thin needle that has an electrode will be inserted into your muscle.  Another small electrode will be placed on your skin near the muscle.  Your health care provider will ask you to continue to remain still.  The electrodes will send a signal that tells about the electrical activity of your muscles. You may see this on a monitor or hear it in the room.  After your muscles have been studied at rest, your health care provider will ask you to contract or flex your muscles. The electrodes will send a signal that tells about the electrical activity of your muscles.  Your health care provider will remove the electrodes and the electrode needles when the procedure is finished. The procedure may vary among health care providers and hospitals.   For NCS  An electrode that records your nerve activity (recording electrode) will be placed on your skin by the muscle that is being studied.  An electrode that is used as a reference (reference electrode) will be placed near the recording electrode.  A paste or gel will be applied to your skin between the recording electrode and the reference electrode.  Your nerve will be stimulated with a mild shock. Your health care provider will measure how much time it takes for your muscle to  react.  Your health care provider will remove the electrodes and the gel when the procedure is finished. The procedure may vary among health care providers and hospitals.   What happens after the procedure?  It is up to you to get the results of your procedure. Ask your health care provider, or the department  that is doing the procedure, when your results will be ready.  Your health care provider may: ? Give you medicines for any pain. ? Monitor the insertion sites to make sure that bleeding stops. Summary  Electromyoneurogram is a test to check how well your muscles and nerves are working.  If the results of the tests are abnormal, this may indicate disease or injury.  This is a safe procedure. However, problems may occur, such as bleeding and infection.  Your health care provider will do two tests to complete this procedure. One checks your muscles (EMG) and another checks your nerves (NCS).  It is up to you to get the results of your procedure. Ask your health care provider, or the department that is doing the procedure, when your results will be ready. This information is not intended to replace advice given to you by your health care provider. Make sure you discuss any questions you have with your health care provider. Document Revised: 02/01/2018 Document Reviewed: 01/14/2018 Elsevier Patient Education  2021 [place].   Occipital Neuralgia  Occipital neuralgia is a type of headache that causes brief episodes of very bad pain in the back of the head. Pain from occipital neuralgia may spread (radiate) to other parts of the head. These headaches may be caused by irritation of the nerves that leave the spinal cord high up in the neck, just below the base of the skull (occipital nerves). The occipital nerves transmit sensations from the back of the head, the top of the head, and the areas behind the ears. What are the causes? This condition can occur without any known cause (primary headache syndrome). In other cases, this condition is caused by pressure on or irritation of one of the two occipital nerves. Pressure and irritation may be due to:  Muscle spasm in the neck.  Neck injury.  Wear and tear of the vertebrae in the neck (osteoarthritis).  Disease of the disks that separate  the vertebrae.  Swollen blood vessels that put pressure on the occipital nerves.  Infections.  Tumors.  Diabetes. What are the signs or symptoms? This condition causes brief burning, stabbing, electric, shocking, or shooting pain in the back of the head that can radiate to the top of the head. It can happen on one side or both sides of the head. It can also cause:  Pain behind the eye.  Pain triggered by neck movement or hair brushing.  Scalp tenderness.  Aching in the back of the head between episodes of very bad pain.  Pain that gets worse with exposure to bright lights. How is this diagnosed? Your health care provider may diagnose the condition based on a physical exam and your symptoms. Tests may be done, such as:  Imaging studies of the brain and neck (cervical spine), such as an MRI or CT scan. These look for causes of pinched nerves.  Applying pressure to the nerves in the neck to try to re-create the pain.  Injection of numbing medicine into the occipital nerve areas to see if pain goes away (diagnostic nerve block).   How is this treated? Treatment for this condition may begin with  simple measures, such as:  Rest.  Massage.  Applying heat or cold to the area.  Over-the-counter pain relievers. If these measures do not work, you may need other treatments, including:  Medicines, such as: ? Prescription-strength anti-inflammatory medicines. ? Muscle relaxants. ? Anti-seizure medicines, which can relieve pain. ? Antidepressants, which can relieve pain. ? Injected medicines, such as medicines that numb the area (local anesthetic) and steroids.  Pulsed radiofrequency ablation. This is when wires are implanted to deliver electrical impulses that block pain signals from the occipital nerve.  Surgery to relieve nerve pressure.  Physical therapy. Follow these instructions at home: Managing pain  Avoid any activities that cause pain.  Rest when you have an  attack of pain.  Try gentle massage to relieve pain.  Try a different pillow or sleeping position.  If directed, apply heat to the affected area as often as told by your health care provider. Use the heat source that your health care provider recommends, such as a moist heat pack or a heating pad. ? Place a towel between your skin and the heat source. ? Leave the heat on for 20-30 minutes. ? Remove the heat if your skin turns bright red. This is especially important if you are unable to feel pain, heat, or cold. You have a greater risk of getting burned.  If directed, put ice on the back of your head and neck area. To do this: ? Put ice in a plastic bag. ? Place a towel between your skin and the bag. ? Leave the ice on for 20 minutes, 2-3 times a day. ? Remove the ice if your skin turns bright red. This is very important. If you cannot feel pain, heat, or cold, you have a greater risk of damage to the area.      General instructions  Take over-the-counter and prescription medicines only as told by your health care provider.  Avoid things that make your symptoms worse, such as bright lights.  Try to stay active. Get regular exercise that does not cause pain. Ask your health care provider to suggest safe exercises for you.  Work with a physical therapist to learn stretching exercises you can do at home.  Practice good posture.  Keep all follow-up visits. This is important. Contact a health care provider if:  Your medicine is not working.  You have new or worsening symptoms. Get help right away if:  You have very bad head pain that does not go away.  You have a sudden change in vision, balance, or speech. These symptoms may represent a serious problem that is an emergency. Do not wait to see if the symptoms will go away. Get medical help right away. Call your local emergency services (911 in the [place] Do not drive yourself to the hospital. Summary  Occipital neuralgia is a  type of headache that causes brief episodes of very bad pain in the back of the head.  Pain from occipital neuralgia may spread (radiate) to other parts of the head.  Treatment for this condition includes rest, massage, and medicines. This information is not intended to replace advice given to you by your health care provider. Make sure you discuss any questions you have with your health care provider. Document Revised: 03/17/2020 Document Reviewed: 03/17/2020 Elsevier Patient Education  2021 [place] Syndrome  Carpal tunnel syndrome is a condition that causes pain, weakness, and numbness in your hand and arm. Numbness is when you cannot feel  an area in your body. The carpal tunnel is a narrow area that is on the palm side of your wrist. Repeated wrist motion or certain diseases may cause swelling in the tunnel. This swelling can pinch the main nerve in the wrist. This nerve is called the median nerve. What are the causes? This condition may be caused by:  Moving your hand and wrist over and over again while doing a task.  Injury to the wrist.  Arthritis.  A sac of fluid (cyst) or abnormal growth (tumor) in the carpal tunnel.  Fluid buildup during pregnancy.  Use of tools that vibrate. Sometimes the cause is not known. What increases the risk? The following factors may make you more likely to have this condition:  Having a job that makes you do these things: ? Move your hand over and over again. ? Work with tools that vibrate, such as drills or sanders.  Being a woman.  Having diabetes, obesity, thyroid problems, or kidney failure. What are the signs or symptoms? Symptoms of this condition include:  A tingling feeling in your fingers.  Tingling or loss of feeling in your hand.  Pain in your entire arm. This pain may get worse when you bend your wrist and elbow for a long time.  Pain in your wrist that goes up your arm to your shoulder.  Pain that  goes down into your palm or fingers.  Weakness in your hands. You may find it hard to grab and hold items. You may feel worse at night. How is this treated? This condition may be treated with:  Lifestyle changes. You will be asked to stop or change the activity that caused your problem.  Doing exercises and activities that make bones, muscles, and tendons stronger (physical therapy).  Learning how to use your hand again (occupational therapy).  Medicines for pain and swelling. You may have injections in your wrist.  A wrist splint or brace.  Surgery. Follow these instructions at home: If you have a splint or brace:  Wear the splint or brace as told by your doctor. Take it off only as told by your doctor.  Loosen the splint if your fingers: ? Tingle. ? Become numb. ? Turn cold and blue.  Keep the splint or brace clean.  If the splint or brace is not waterproof: ? Do not let it get wet. ? Cover it with a watertight covering when you take a bath or a shower. Managing pain, stiffness, and swelling If told, put ice on the painful area:  If you have a removable splint or brace, remove it as told by your doctor.  Put ice in a plastic bag.  Place a towel between your skin and the bag.  Leave the ice on for 20 minutes, 2-3 times per day. Do not fall asleep with the cold pack on your skin.  Take off the ice if your skin turns bright red. This is very important. If you cannot feel pain, heat, or cold, you have a greater risk of damage to the area. Move your fingers often to reduce stiffness and swelling.   General instructions  Take over-the-counter and prescription medicines only as told by your doctor.  Rest your wrist from any activity that may cause pain. If needed, talk with your boss at work about changes that can help your wrist heal.  Do exercises as told by your doctor, physical therapist, or occupational therapist.  Keep all follow-up visits. Contact a doctor  if:  You have new symptoms.  Medicine does not help your pain.  Your symptoms get worse. Get help right away if:  You have very bad numbness or tingling in your wrist or hand. Summary  Carpal tunnel syndrome is a condition that causes pain in your hand and arm.  It is often caused by repeated wrist motions.  Lifestyle changes and medicines are used to treat this problem. Surgery may help in very bad cases.  Follow your doctor's instructions about wearing a splint, resting your wrist, keeping follow-up visits, and calling for help. This information is not intended to replace advice given to you by your health care provider. Make sure you discuss any questions you have with your health care provider. Document Revised: 09/28/2019 Document Reviewed: 09/28/2019 Elsevier Patient Education  [place].

## 2020-07-23 ENCOUNTER — Ambulatory Visit: Payer: Medicare Other | Admitting: Neurology

## 2020-07-23 ENCOUNTER — Ambulatory Visit (INDEPENDENT_AMBULATORY_CARE_PROVIDER_SITE_OTHER): Payer: Medicare Other | Admitting: Neurology

## 2020-07-23 DIAGNOSIS — G5603 Carpal tunnel syndrome, bilateral upper limbs: Secondary | ICD-10-CM | POA: Diagnosis not present

## 2020-07-23 DIAGNOSIS — Z0289 Encounter for other administrative examinations: Secondary | ICD-10-CM

## 2020-07-23 DIAGNOSIS — R29898 Other symptoms and signs involving the musculoskeletal system: Secondary | ICD-10-CM | POA: Diagnosis not present

## 2020-07-23 NOTE — Progress Notes (Signed)
Full Name: Roberto Reid Gender: Male MRN #: [id number] Date of Birth: [birth year]    Visit Date: 07/23/2020 08:02 Age: 87 Years Examining Physician: Sarina Ill, MD  Referring Physician: Elby Showers , MD  History: Weakness hands, thenar atrophy, numbness in a median distribution Summary: EMG/NCS was performed on the bilateral upper extremities.   The left median APB motor nerve showed prolonged distal onset latency (14.9 ms, N<4.4) and reduced amplitude(0.1 mV, N>4). The right median APB motor nerve showed prolonged distal onset latency (5.8 ms, N<4.4) and reduced amplitude(3.4 mV, N>4).The left ulnar ADM motor nerve showed prolonged distal onset latency (4.0 ms, N<3.3) and reduced amplitude (1.4 mV, N>6).  The left radial sensory nerve showed reduced amplitude (11 V,N> 15).The left median/ulnar (palm) comparison nerve showed no response when recording at the Median or Ulnar palm. The right median/ulnar (palm) comparison nerve showed prolonged distal peak latency (Median Palm, 2.9 ms, N<2.2) and  prolonged distal peak latency (Ulnar Palm, 2.4 ms, N<2.2) and abnormal peak latency difference (Median Palm-Ulnar Palm, 0.5 ms, N<0.4) with a relative median delay.  The left  Median 2nd Digit orthodromic sensory nerve showed no response. The right Median 2nd Digit orthodromic sensory nerve showed prolonged distal peak latency (4.4 ms, N<3.4) and reduced amplitude(4 uV, N>10). The left  Ulnar 5th Digit orthodromic sensory nerve showed no response. The right Ulnar 5th Digit orthodromic sensory nerve showed prolonged distal peak latency (3.4 ms, N<3.1) and reduced amplitude(3 uV, N>5). The left Ulnar F wave showed delayed latency(34.15ms, N<32).  The right Ulnar F wave showed delayed latency(33.1ms, N<32). All remaining nerves (as indicated in the following tables) were within normal limits. The left first dorsal interosseous showed prolonged motor unit duration, polyphasic motor units and diminished  motor unit recruitment.  The left opponens pollicis showed increased spontaneous activity, prolonged motor unit duration, diminished motor unit recruitment.  The left digitorum profundus showed polyphasic motor units, increased motor unit amplitude and diminished motor unit recruitment.  The right first dorsal interosseous muscle showed diminished motor unit recruitment.  The right opponens pollicis showed diminished motor unit recruitment.  All remaining muscles (as indicated in the following tables) were within normal limits.    Conclusion:  1. There is severe left Carpal Tunnel Syndrome and moderately-severe right Carpal Tunnel Syndrome. 2. There may be concomitant left mild Ulnar neuropathy vs polyneuropathy however patient is asymptomatic. 3. No suggestion radiculopathy.     Sarina Ill, M.D.  [facility] [address], [place], [place] [address] Tel: [phone] Fax: [phone]  Verbal informed consent was obtained from the patient, patient was informed of potential risk of procedure, including bruising, bleeding, hematoma formation, infection, muscle weakness, muscle pain, numbness, among others.        [place]    Nerve / Sites Muscle Latency Ref. Amplitude Ref. Rel Amp Segments Distance Velocity Ref. Area    ms ms mV mV %  cm m/s m/s mVms  L Median - APB     Wrist APB 14.9 ?4.4 0.1 ?4.0 100 Wrist - APB 7   0.2     Upper arm APB 19.6  0.1  101 Upper arm - Wrist 23 49 ?49 0.1  R Median - APB     Wrist APB 5.8 ?4.4 3.4 ?4.0 100 Wrist - APB 7   10.2     Upper arm APB 10.3  3.2  95 Upper arm - Wrist 22 49 ?49 10.1  L Ulnar -  ADM     Wrist ADM 4.0 ?3.3 1.4 ?6.0 100 Wrist - ADM 7   2.7     B.Elbow ADM 8.0  3.3  235 B.Elbow - Wrist 21 52 ?49 14.0     A.Elbow ADM 10.0  3.6  107 A.Elbow - B.Elbow 10 51 ?49 15.5  R Ulnar - ADM     Wrist ADM 3.2 ?3.3 7.6 ?6.0 100 Wrist - ADM 7   25.7     B.Elbow ADM 7.2  7.2  93.8 B.Elbow - Wrist 20 49 ?49 25.3     A.Elbow ADM  9.3  6.8  94.5 A.Elbow - B.Elbow 10 49 ?49 23.8             SNC    Nerve / Sites Rec. Site Peak Lat Ref.  Amp Ref. Segments Distance Peak Diff Ref.    ms ms V V  cm ms ms  L Radial - Anatomical snuff box (Forearm)     Forearm Wrist 2.9 ?2.9 11 ?15 Forearm - Wrist 10    L Median, Ulnar - Transcarpal comparison     Median Palm Wrist NR ?2.2 NR ?35 Median Palm - Wrist 8       Ulnar Palm Wrist NR ?2.2 NR ?12 Ulnar Palm - Wrist 8          Median Palm - Ulnar Palm  NR ?0.4  R Median, Ulnar - Transcarpal comparison     Median Palm Wrist 2.9 ?2.2 28 ?35 Median Palm - Wrist 8       Ulnar Palm Wrist 2.4 ?2.2 2 ?12 Ulnar Palm - Wrist 8          Median Palm - Ulnar Palm  0.5 ?0.4  L Median - Orthodromic (Dig II, Mid palm)     Dig II Wrist NR ?3.4 NR ?10 Dig II - Wrist 13    R Median - Orthodromic (Dig II, Mid palm)     Dig II Wrist 4.4 ?3.4 4 ?10 Dig II - Wrist 13    L Ulnar - Orthodromic, (Dig V, Mid palm)     Dig V Wrist NR ?3.1 NR ?5 Dig V - Wrist 11    R Ulnar - Orthodromic, (Dig V, Mid palm)     Dig V Wrist 3.4 ?3.1 3 ?5 Dig V - Wrist 88                     F  Wave    Nerve F Lat Ref.   ms ms  L Ulnar - ADM 34.8 ?32.0  R Ulnar - ADM 33.7 ?32.0         EMG Summary Table    Spontaneous MUAP Recruitment  Muscle IA Fib PSW Fasc Other Amp Dur. Poly Pattern  Bilat Deltoid Normal None None None _______ Normal Normal Normal Normal  Bilat Triceps brachii Normal None None None _______ Normal Normal Normal Normal  Bilat Pronator teres Normal None None None _______ Normal Normal Normal Normal  Bilat Cervical paraspinals (low) Normal None None None _______ Normal Normal Normal Normal  L. First dorsal interosseous Normal None None None _______ Normal Increased 3+ Reduced  L. Opponens pollicis Normal None 3+ None _______ Normal Increased Normal Reduced  L. Flexor digitorum profundus (Ulnar) Normal None None None _______ Increased Normal 3+ Reduced  L. Extensor indicis proprius Normal None None  None _______ Normal Normal Normal Normal  R. First dorsal interosseous   Normal None None None  _______ Normal Normal Normal Reduced  R Opponens pollicis Normal None None None _______ Normal Normal Normal Reduced

## 2020-07-23 NOTE — Progress Notes (Signed)
See procedure note.

## 2020-07-24 ENCOUNTER — Other Ambulatory Visit: Payer: Self-pay | Admitting: Urology

## 2020-07-24 NOTE — Procedures (Signed)
Full Name: Roberto Reid Gender: Male MRN #: [id number] Date of Birth: 1933/05/26    Visit Date: 07/23/2020 08:02 Age: 87 Years Examining Physician: Sarina Ill, MD  Referring Physician: Elby Showers , MD  History: Weakness hands, thenar atrophy, numbness in a median distribution Summary: EMG/NCS was performed on the bilateral upper extremities.   The left median APB motor nerve showed prolonged distal onset latency (14.9 ms, N<4.4) and reduced amplitude(0.1 mV, N>4). The right median APB motor nerve showed prolonged distal onset latency (5.8 ms, N<4.4) and reduced amplitude(3.4 mV, N>4).The left ulnar ADM motor nerve showed prolonged distal onset latency (4.0 ms, N<3.3) and reduced amplitude (1.4 mV, N>6).  The left radial sensory nerve showed reduced amplitude (11 V,N> 15).The left median/ulnar (palm) comparison nerve showed no response when recording at the Median or Ulnar palm. The right median/ulnar (palm) comparison nerve showed prolonged distal peak latency (Median Palm, 2.9 ms, N<2.2) and  prolonged distal peak latency (Ulnar Palm, 2.4 ms, N<2.2) and abnormal peak latency difference (Median Palm-Ulnar Palm, 0.5 ms, N<0.4) with a relative median delay.  The left  Median 2nd Digit orthodromic sensory nerve showed no response. The right Median 2nd Digit orthodromic sensory nerve showed prolonged distal peak latency (4.4 ms, N<3.4) and reduced amplitude(4 uV, N>10). The left  Ulnar 5th Digit orthodromic sensory nerve showed no response. The right Ulnar 5th Digit orthodromic sensory nerve showed prolonged distal peak latency (3.4 ms, N<3.1) and reduced amplitude(3 uV, N>5). The left Ulnar F wave showed delayed latency(34.87ms, N<32).  The right Ulnar F wave showed delayed latency(33.38ms, N<32). All remaining nerves (as indicated in the following tables) were within normal limits.The left first dorsal interosseous showed prolonged motor unit duration, polyphasic motor units and diminished  motor unit recruitment.  The left opponens pollicis showed increased spontaneous activity, prolonged motor unit duration, diminished motor unit recruitment.  The left digitorum profundus showed polyphasic motor units, increased motor unit amplitude and diminished motor unit recruitment.  The right first dorsal interosseous muscle showed diminished motor unit recruitment.  The right opponens pollicis showed diminished motor unit recruitment.  All remaining muscles (as indicated in the following tables) were within normal limits.    Conclusion: 1. There is severe left Carpal Tunnel Syndrome and moderately-severe right Carpal Tunnel Syndrome. 2. There may be concomitant left mild Ulnar neuropathy vs polyneuropathy however patient is asymptomatic. 3. No suggestion radiculopathy.   Sarina Ill, M.D.  [facility] [address] [place], [place] [address] Tel: [phone] Fax: [phone]  Verbal informed consent was obtained from the patient, patient was informed of potential risk of procedure, including bruising, bleeding, hematoma formation, infection, muscle weakness, muscle pain, numbness, among others.        [place]    Nerve / Sites Muscle Latency Ref. Amplitude Ref. Rel Amp Segments Distance Velocity Ref. Area    ms ms mV mV %  cm m/s m/s mVms  L Median - APB     Wrist APB 14.9 ?4.4 0.1 ?4.0 100 Wrist - APB 7   0.2     Upper arm APB 19.6  0.1  101 Upper arm - Wrist 23 49 ?49 0.1  R Median - APB     Wrist APB 5.8 ?4.4 3.4 ?4.0 100 Wrist - APB 7   10.2     Upper arm APB 10.3  3.2  95 Upper arm - Wrist 22 49 ?49 10.1  L Ulnar - ADM  Wrist ADM 4.0 ?3.3 1.4 ?6.0 100 Wrist - ADM 7   2.7     B.Elbow ADM 8.0  3.3  235 B.Elbow - Wrist 21 52 ?49 14.0     A.Elbow ADM 10.0  3.6  107 A.Elbow - B.Elbow 10 51 ?49 15.5  R Ulnar - ADM     Wrist ADM 3.2 ?3.3 7.6 ?6.0 100 Wrist - ADM 7   25.7     B.Elbow ADM 7.2  7.2  93.8 B.Elbow - Wrist 20 49 ?49 25.3     A.Elbow ADM 9.3   6.8  94.5 A.Elbow - B.Elbow 10 49 ?49 23.8             SNC    Nerve / Sites Rec. Site Peak Lat Ref.  Amp Ref. Segments Distance Peak Diff Ref.    ms ms V V  cm ms ms  L Radial - Anatomical snuff box (Forearm)     Forearm Wrist 2.9 ?2.9 11 ?15 Forearm - Wrist 10    L Median, Ulnar - Transcarpal comparison     Median Palm Wrist NR ?2.2 NR ?35 Median Palm - Wrist 8       Ulnar Palm Wrist NR ?2.2 NR ?12 Ulnar Palm - Wrist 8          Median Palm - Ulnar Palm  NR ?0.4  R Median, Ulnar - Transcarpal comparison     Median Palm Wrist 2.9 ?2.2 28 ?35 Median Palm - Wrist 8       Ulnar Palm Wrist 2.4 ?2.2 2 ?12 Ulnar Palm - Wrist 8          Median Palm - Ulnar Palm  0.5 ?0.4  L Median - Orthodromic (Dig II, Mid palm)     Dig II Wrist NR ?3.4 NR ?10 Dig II - Wrist 13    R Median - Orthodromic (Dig II, Mid palm)     Dig II Wrist 4.4 ?3.4 4 ?10 Dig II - Wrist 13    L Ulnar - Orthodromic, (Dig V, Mid palm)     Dig V Wrist NR ?3.1 NR ?5 Dig V - Wrist 11    R Ulnar - Orthodromic, (Dig V, Mid palm)     Dig V Wrist 3.4 ?3.1 3 ?5 Dig V - Wrist 29                     F  Wave    Nerve F Lat Ref.   ms ms  L Ulnar - ADM 34.8 ?32.0  R Ulnar - ADM 33.7 ?32.0         EMG Summary Table    Spontaneous MUAP Recruitment  Muscle IA Fib PSW Fasc Other Amp Dur. Poly Pattern  Bilat Deltoid Normal None None None _______ Normal Normal Normal Normal  Bilat Triceps brachii Normal None None None _______ Normal Normal Normal Normal  Bilat Pronator teres Normal None None None _______ Normal Normal Normal Normal  Bilat Cervical paraspinals (low) Normal None None None _______ Normal Normal Normal Normal  L. First dorsal interosseous Normal None None None _______ Normal Increased 3+ Reduced  L. Opponens pollicis Normal None 3+ None _______ Normal Increased Normal Reduced  L. Flexor digitorum profundus (Ulnar) Normal None None None _______ Increased Normal 3+ Reduced  L. Extensor indicis proprius Normal None None None  _______ Normal Normal Normal Normal  R. First dorsal interosseous   Normal None None None _______ Normal Normal Normal Reduced  R Opponens pollicis Normal None None None _______ Normal Normal Normal Reduced

## 2020-07-24 NOTE — Progress Notes (Signed)
How about Daryll Brod at the [facility]?  I have referred to [facility] at the [facility].I like Dr. Amedeo Plenty very much also.

## 2020-07-25 NOTE — Progress Notes (Signed)
That's great.

## 2020-07-28 ENCOUNTER — Other Ambulatory Visit: Payer: Self-pay | Admitting: Internal Medicine

## 2020-07-29 ENCOUNTER — Other Ambulatory Visit: Payer: Self-pay | Admitting: Orthopedic Surgery

## 2020-07-29 ENCOUNTER — Telehealth: Payer: Self-pay | Admitting: Internal Medicine

## 2020-07-29 DIAGNOSIS — G5603 Carpal tunnel syndrome, bilateral upper limbs: Secondary | ICD-10-CM | POA: Diagnosis not present

## 2020-07-29 NOTE — Telephone Encounter (Signed)
Faxed signed surgery clearance to [facility] [phone], phone [phone]

## 2020-08-05 ENCOUNTER — Telehealth: Payer: Self-pay | Admitting: Internal Medicine

## 2020-08-05 ENCOUNTER — Encounter: Payer: Self-pay | Admitting: Internal Medicine

## 2020-08-05 MED ORDER — AMPICILLIN 500 MG PO CAPS: 500.0000 mg | ORAL_CAPSULE | Freq: Three times a day (TID) | ORAL | 0 refills | Status: DC

## 2020-08-05 NOTE — Telephone Encounter (Addendum)
Roberto Reid [id number]  [name] called to say he needs his antibiotic filled he is having a dental procedure this week, having some implants put in.   I spoke with patient. Says he needs Ampicillin prior to procedure. Call in Ampicillin 500 mg #21 tabs  one by mouth 3 times a day.MJB,MD 4:20 pm

## 2020-08-06 DIAGNOSIS — E291 Testicular hypofunction: Secondary | ICD-10-CM | POA: Diagnosis not present

## 2020-08-06 DIAGNOSIS — N5201 Erectile dysfunction due to arterial insufficiency: Secondary | ICD-10-CM | POA: Diagnosis not present

## 2020-08-09 ENCOUNTER — Encounter (HOSPITAL_BASED_OUTPATIENT_CLINIC_OR_DEPARTMENT_OTHER): Payer: Self-pay | Admitting: Orthopedic Surgery

## 2020-08-09 ENCOUNTER — Other Ambulatory Visit: Payer: Self-pay

## 2020-08-09 MED ORDER — HYDROCODONE-ACETAMINOPHEN 10-325 MG PO TABS
1.0000 | ORAL_TABLET | Freq: Four times a day (QID) | ORAL | 0 refills | Status: DC
Start: 2020-08-09 — End: 2020-09-09

## 2020-08-09 NOTE — Telephone Encounter (Signed)
Patient called to request a refill. Last refill 07/14/20.

## 2020-08-13 ENCOUNTER — Other Ambulatory Visit (HOSPITAL_COMMUNITY)
Admission: RE | Admit: 2020-08-13 | Discharge: 2020-08-13 | Disposition: A | Discharge status: Home / Self Care | Payer: Medicare Other | Source: Ambulatory Visit | Attending: Orthopedic Surgery | Admitting: Orthopedic Surgery

## 2020-08-13 DIAGNOSIS — Z01812 Encounter for preprocedural laboratory examination: Secondary | ICD-10-CM | POA: Diagnosis not present

## 2020-08-13 DIAGNOSIS — Z20822 Contact with and (suspected) exposure to covid-19: Secondary | ICD-10-CM | POA: Diagnosis not present

## 2020-08-13 LAB — SARS CORONAVIRUS 2 (TAT 6-24 HRS): SARS Coronavirus 2: NEGATIVE

## 2020-08-13 NOTE — Progress Notes (Signed)

## 2020-08-16 ENCOUNTER — Ambulatory Visit (HOSPITAL_BASED_OUTPATIENT_CLINIC_OR_DEPARTMENT_OTHER)
Admission: RE | Admit: 2020-08-16 | Discharge: 2020-08-16 | Disposition: A | Discharge status: Home / Self Care | Payer: Medicare Other | Attending: Orthopedic Surgery | Admitting: Orthopedic Surgery

## 2020-08-16 ENCOUNTER — Ambulatory Visit (HOSPITAL_BASED_OUTPATIENT_CLINIC_OR_DEPARTMENT_OTHER): Payer: Medicare Other | Admitting: Anesthesiology

## 2020-08-16 ENCOUNTER — Other Ambulatory Visit: Payer: Self-pay

## 2020-08-16 ENCOUNTER — Encounter (HOSPITAL_BASED_OUTPATIENT_CLINIC_OR_DEPARTMENT_OTHER)
Admission: RE | Disposition: A | Discharge status: Home / Self Care | Payer: Self-pay | Source: Home / Self Care | Attending: Orthopedic Surgery

## 2020-08-16 ENCOUNTER — Encounter (HOSPITAL_BASED_OUTPATIENT_CLINIC_OR_DEPARTMENT_OTHER): Payer: Self-pay | Admitting: Orthopedic Surgery

## 2020-08-16 DIAGNOSIS — Z882 Allergy status to sulfonamides status: Secondary | ICD-10-CM | POA: Insufficient documentation

## 2020-08-16 DIAGNOSIS — Z96652 Presence of left artificial knee joint: Secondary | ICD-10-CM | POA: Diagnosis not present

## 2020-08-16 DIAGNOSIS — Z7901 Long term (current) use of anticoagulants: Secondary | ICD-10-CM | POA: Insufficient documentation

## 2020-08-16 DIAGNOSIS — Z87891 Personal history of nicotine dependence: Secondary | ICD-10-CM | POA: Insufficient documentation

## 2020-08-16 DIAGNOSIS — Z79899 Other long term (current) drug therapy: Secondary | ICD-10-CM | POA: Insufficient documentation

## 2020-08-16 DIAGNOSIS — I129 Hypertensive chronic kidney disease with stage 1 through stage 4 chronic kidney disease, or unspecified chronic kidney disease: Secondary | ICD-10-CM | POA: Diagnosis not present

## 2020-08-16 DIAGNOSIS — G5602 Carpal tunnel syndrome, left upper limb: Secondary | ICD-10-CM | POA: Diagnosis not present

## 2020-08-16 DIAGNOSIS — N184 Chronic kidney disease, stage 4 (severe): Secondary | ICD-10-CM | POA: Diagnosis not present

## 2020-08-16 DIAGNOSIS — Z86718 Personal history of other venous thrombosis and embolism: Secondary | ICD-10-CM | POA: Insufficient documentation

## 2020-08-16 DIAGNOSIS — Z89421 Acquired absence of other right toe(s): Secondary | ICD-10-CM | POA: Diagnosis not present

## 2020-08-16 DIAGNOSIS — E1122 Type 2 diabetes mellitus with diabetic chronic kidney disease: Secondary | ICD-10-CM | POA: Diagnosis not present

## 2020-08-16 HISTORY — PX: CARPAL TUNNEL RELEASE: SHX101

## 2020-08-16 SURGERY — CARPAL TUNNEL RELEASE
Anesthesia: General | Site: Wrist | Laterality: Left

## 2020-08-16 MED ORDER — OXYCODONE HCL 5 MG/5ML PO SOLN: 5.0000 mg | Freq: Once | ORAL | Status: DC | PRN

## 2020-08-16 MED ORDER — FENTANYL CITRATE (PF) 100 MCG/2ML IJ SOLN
INTRAMUSCULAR | Status: DC | PRN
  Administered 2020-08-16 (×2): 50 ug via INTRAVENOUS

## 2020-08-16 MED ORDER — LIDOCAINE 2% (20 MG/ML) 5 ML SYRINGE
INTRAMUSCULAR | Status: AC
  Filled 2020-08-16: qty 5

## 2020-08-16 MED ORDER — PHENYLEPHRINE 40 MCG/ML (10ML) SYRINGE FOR IV PUSH (FOR BLOOD PRESSURE SUPPORT)
PREFILLED_SYRINGE | INTRAVENOUS | Status: AC
  Filled 2020-08-16: qty 10

## 2020-08-16 MED ORDER — ONDANSETRON HCL 4 MG/2ML IJ SOLN: 4.0000 mg | Freq: Once | INTRAMUSCULAR | Status: DC | PRN

## 2020-08-16 MED ORDER — ONDANSETRON HCL 4 MG/2ML IJ SOLN
INTRAMUSCULAR | Status: AC
  Filled 2020-08-16: qty 2

## 2020-08-16 MED ORDER — SUCCINYLCHOLINE CHLORIDE 200 MG/10ML IV SOSY
PREFILLED_SYRINGE | INTRAVENOUS | Status: AC
  Filled 2020-08-16: qty 10

## 2020-08-16 MED ORDER — CEFAZOLIN SODIUM-DEXTROSE 2-4 GM/100ML-% IV SOLN
2.0000 g | INTRAVENOUS | Status: AC
  Administered 2020-08-16: 2 g via INTRAVENOUS

## 2020-08-16 MED ORDER — ONDANSETRON HCL 4 MG/2ML IJ SOLN
INTRAMUSCULAR | Status: DC | PRN
  Administered 2020-08-16: 4 mg via INTRAVENOUS

## 2020-08-16 MED ORDER — LACTATED RINGERS IV SOLN: INTRAVENOUS | Status: DC

## 2020-08-16 MED ORDER — BUPIVACAINE HCL (PF) 0.25 % IJ SOLN
INTRAMUSCULAR | Status: AC
  Filled 2020-08-16: qty 30

## 2020-08-16 MED ORDER — OXYCODONE HCL 5 MG PO TABS: 5.0000 mg | ORAL_TABLET | Freq: Once | ORAL | Status: DC | PRN

## 2020-08-16 MED ORDER — DEXAMETHASONE SODIUM PHOSPHATE 10 MG/ML IJ SOLN
INTRAMUSCULAR | Status: DC | PRN
  Administered 2020-08-16: 5 mg via INTRAVENOUS

## 2020-08-16 MED ORDER — DEXAMETHASONE SODIUM PHOSPHATE 10 MG/ML IJ SOLN
INTRAMUSCULAR | Status: AC
  Filled 2020-08-16: qty 1

## 2020-08-16 MED ORDER — FENTANYL CITRATE (PF) 100 MCG/2ML IJ SOLN
INTRAMUSCULAR | Status: AC
  Filled 2020-08-16: qty 2

## 2020-08-16 MED ORDER — CEFAZOLIN SODIUM-DEXTROSE 2-4 GM/100ML-% IV SOLN
INTRAVENOUS | Status: AC
  Filled 2020-08-16: qty 100

## 2020-08-16 MED ORDER — EPHEDRINE 5 MG/ML INJ
INTRAVENOUS | Status: AC
  Filled 2020-08-16: qty 10

## 2020-08-16 MED ORDER — ACETAMINOPHEN 10 MG/ML IV SOLN: 1000.0000 mg | Freq: Once | INTRAVENOUS | Status: DC | PRN

## 2020-08-16 MED ORDER — 0.9 % SODIUM CHLORIDE (POUR BTL) OPTIME
TOPICAL | Status: DC | PRN
  Administered 2020-08-16: 120 mL

## 2020-08-16 MED ORDER — BUPIVACAINE HCL (PF) 0.25 % IJ SOLN
INTRAMUSCULAR | Status: DC | PRN
  Administered 2020-08-16: 9 mL

## 2020-08-16 MED ORDER — EPHEDRINE SULFATE 50 MG/ML IJ SOLN
INTRAMUSCULAR | Status: DC | PRN
  Administered 2020-08-16: 10 mg via INTRAVENOUS

## 2020-08-16 MED ORDER — OXYCODONE HCL 5 MG PO TABS: ORAL_TABLET | ORAL | 0 refills | Status: DC

## 2020-08-16 MED ORDER — LIDOCAINE HCL (CARDIAC) PF 100 MG/5ML IV SOSY
PREFILLED_SYRINGE | INTRAVENOUS | Status: DC | PRN
  Administered 2020-08-16: 60 mg via INTRAVENOUS

## 2020-08-16 SURGICAL SUPPLY — 40 items
APL PRP STRL LF DISP 70% ISPRP (MISCELLANEOUS) ×1
BLADE SURG 15 STRL LF DISP TIS (BLADE) ×2 IMPLANT
BLADE SURG 15 STRL SS (BLADE) ×4
BNDG CMPR 9X4 STRL LF SNTH (GAUZE/BANDAGES/DRESSINGS) ×1
BNDG ELASTIC 3X5.8 VLCR STR LF (GAUZE/BANDAGES/DRESSINGS) ×2 IMPLANT
BNDG ESMARK 4X9 LF (GAUZE/BANDAGES/DRESSINGS) ×1 IMPLANT
BNDG GAUZE ELAST 4 BULKY (GAUZE/BANDAGES/DRESSINGS) ×2 IMPLANT
CHLORAPREP W/TINT 26 (MISCELLANEOUS) ×2 IMPLANT
CORD BIPOLAR FORCEPS 12FT (ELECTRODE) ×2 IMPLANT
COVER BACK TABLE 60X90IN (DRAPES) ×2 IMPLANT
COVER MAYO STAND STRL (DRAPES) ×2 IMPLANT
COVER WAND RF STERILE (DRAPES) IMPLANT
CUFF TOURN SGL QUICK 18X4 (TOURNIQUET CUFF) ×2 IMPLANT
DRAPE EXTREMITY T 121X128X90 (DISPOSABLE) ×2 IMPLANT
DRAPE SURG 17X23 STRL (DRAPES) ×2 IMPLANT
DRSG PAD ABDOMINAL 8X10 ST (GAUZE/BANDAGES/DRESSINGS) ×2 IMPLANT
GAUZE SPONGE 4X4 12PLY STRL (GAUZE/BANDAGES/DRESSINGS) ×2 IMPLANT
GAUZE XEROFORM 1X8 LF (GAUZE/BANDAGES/DRESSINGS) ×2 IMPLANT
GLOVE SRG 8 PF TXTR STRL LF DI (GLOVE) ×1 IMPLANT
GLOVE SURG ENC MOIS LTX SZ7.5 (GLOVE) ×2 IMPLANT
GLOVE SURG POLYISO LF SZ8 (GLOVE) ×2 IMPLANT
GLOVE SURG UNDER POLY LF SZ7 (GLOVE) ×1 IMPLANT
GLOVE SURG UNDER POLY LF SZ8 (GLOVE) ×2
GOWN STRL REUS W/ TWL LRG LVL3 (GOWN DISPOSABLE) ×1 IMPLANT
GOWN STRL REUS W/TWL 2XL LVL3 (GOWN DISPOSABLE) ×1 IMPLANT
GOWN STRL REUS W/TWL LRG LVL3 (GOWN DISPOSABLE)
GOWN STRL REUS W/TWL XL LVL3 (GOWN DISPOSABLE) ×2 IMPLANT
NDL HYPO 25X1 1.5 SAFETY (NEEDLE) ×1 IMPLANT
NEEDLE HYPO 25X1 1.5 SAFETY (NEEDLE) ×2 IMPLANT
NS IRRIG 1000ML POUR BTL (IV SOLUTION) ×2 IMPLANT
PACK BASIN DAY SURGERY FS (CUSTOM PROCEDURE TRAY) ×2 IMPLANT
PADDING CAST ABS 4INX4YD NS (CAST SUPPLIES) ×1
PADDING CAST ABS COTTON 4X4 ST (CAST SUPPLIES) ×1 IMPLANT
SLEEVE SCD COMPRESS KNEE MED (STOCKING) ×1 IMPLANT
STOCKINETTE 4X48 STRL (DRAPES) ×2 IMPLANT
SUT ETHILON 4 0 PS 2 18 (SUTURE) ×2 IMPLANT
SYR BULB EAR ULCER 3OZ GRN STR (SYRINGE) ×2 IMPLANT
SYR CONTROL 10ML LL (SYRINGE) ×2 IMPLANT
TOWEL GREEN STERILE FF (TOWEL DISPOSABLE) ×3 IMPLANT
UNDERPAD 30X36 HEAVY ABSORB (UNDERPADS AND DIAPERS) ×2 IMPLANT

## 2020-08-16 NOTE — Discharge Instructions (Addendum)

## 2020-08-16 NOTE — Anesthesia Preprocedure Evaluation (Addendum)
Anesthesia Evaluation  Patient identified by MRN, date of birth, ID band Patient awake    Reviewed: Allergy & Precautions, NPO status , Patient's Chart, lab work & pertinent test results  Airway Mallampati: II  TM Distance: >3 FB Neck ROM: Full    Dental  (+) Poor Dentition, Upper Dentures, Lower Dentures   Pulmonary asthma , former smoker,    Pulmonary exam normal        Cardiovascular hypertension, Pt. on medications + Peripheral Vascular Disease and + DVT (2018)   Rhythm:Regular Rate:Normal     Neuro/Psych Anxiety negative neurological ROS     GI/Hepatic Neg liver ROS, GERD  Medicated,  Endo/Other  negative endocrine ROS  Renal/GU negative Renal ROS  negative genitourinary   Musculoskeletal  (+) Arthritis , Osteoarthritis,    Abdominal (+)  Abdomen: soft. Bowel sounds: normal.  Peds  Hematology negative hematology ROS (+)   Anesthesia Other Findings   Reproductive/Obstetrics                            Anesthesia Physical Anesthesia Plan  ASA: III  Anesthesia Plan: General   Post-op Pain Management:    Induction: Intravenous  PONV Risk Score and Plan: 2 and Ondansetron, Dexamethasone and Treatment may vary due to age or medical condition  Airway Management Planned: Mask and LMA  Additional Equipment: None  Intra-op Plan:   Post-operative Plan: Extubation in OR  Informed Consent: I have reviewed the patients History and Physical, chart, labs and discussed the procedure including the risks, benefits and alternatives for the proposed anesthesia with the patient or authorized representative who has indicated his/her understanding and acceptance.     Dental advisory given  Plan Discussed with: CRNA  Anesthesia Plan Comments: (Lab Results      Component                Value               Date                      WBC                      5.7                 05/07/2020                 HGB                      13.6                05/07/2020                HCT                      39.0                05/07/2020                MCV                      96.5                05/07/2020                PLT  171                 05/07/2020           Lab Results      Component                Value               Date                      NA                       138                 05/07/2020                K                        4.7                 05/07/2020                CO2                      28                  05/07/2020                GLUCOSE                  113 (H)             05/07/2020                BUN                      21                  05/07/2020                CREATININE               1.45 (H)            05/07/2020                CALCIUM                  8.8                 05/07/2020                GFRNONAA                 44 (L)              05/07/2020                GFRAA                    51 (L)              05/07/2020          )       Anesthesia Quick Evaluation

## 2020-08-16 NOTE — Anesthesia Procedure Notes (Signed)
Procedure Name: LMA Insertion Date/Time: 08/16/2020 1:25 PM Performed by: Willa Frater, CRNA Pre-anesthesia Checklist: Patient identified, Emergency Drugs available, Suction available and Patient being monitored Patient Re-evaluated:Patient Re-evaluated prior to induction Oxygen Delivery Method: Circle system utilized Preoxygenation: Pre-oxygenation with 100% oxygen Induction Type: IV induction Ventilation: Mask ventilation without difficulty LMA: LMA inserted LMA Size: 4.0 Number of attempts: 1 Airway Equipment and Method: Bite block Placement Confirmation: positive ETCO2 Tube secured with: Tape Dental Injury: Teeth and Oropharynx as per pre-operative assessment

## 2020-08-16 NOTE — Anesthesia Postprocedure Evaluation (Signed)
Anesthesia Post Note  Patient: Roberto Reid  Procedure(s) Performed: LEFT CARPAL TUNNEL RELEASE (Left Wrist)     Patient location during evaluation: PACU Anesthesia Type: General Level of consciousness: awake and alert Pain management: pain level controlled Vital Signs Assessment: post-procedure vital signs reviewed and stable Respiratory status: spontaneous breathing, nonlabored ventilation, respiratory function stable and patient connected to nasal cannula oxygen Cardiovascular status: blood pressure returned to baseline and stable Postop Assessment: no apparent nausea or vomiting Anesthetic complications: no   No complications documented.  Last Vitals:  Vitals:   08/16/20 1430 08/16/20 1445  BP: (!) 116/58 (!) 126/57  Pulse: 82 83  Resp: 10 18  Temp:  36.6 C  SpO2: 95% 98%    Last Pain:  Vitals:   08/16/20 1445  PainSc: 0-No pain                 Belenda Cruise P Marshawn Normoyle

## 2020-08-16 NOTE — Op Note (Signed)
08/16/2020 [facility]                              OPERATIVE REPORT   PREOPERATIVE DIAGNOSIS:  Left carpal tunnel syndrome.  POSTOPERATIVE DIAGNOSIS:  Left carpal tunnel syndrome.  PROCEDURE:  Left carpal tunnel release.  SURGEON:  [name], MD  ASSISTANT:  none.  ANESTHESIA: General  IV FLUIDS:  Per anesthesia flow sheet.  ESTIMATED BLOOD LOSS:  Minimal.  COMPLICATIONS:  None.  SPECIMENS:  None.  TOURNIQUET TIME:    Total Tourniquet Time Documented: Upper Arm (Left) - 17 minutes Total: Upper Arm (Left) - 17 minutes   DISPOSITION:  Stable to PACU.  LOCATION: [facility]  INDICATIONS:  87 yo male with numbness and tingling in left hand.  Positive nerve conduction studies.  Nocturnal symptoms.  He wishes to have a carpal tunnel release for management of his symptoms.  Risks, benefits and alternatives of surgery were discussed including the risk of blood loss; infection; damage to nerves, vessels, tendons, ligaments, bone; failure of surgery; need for additional surgery; complications with wound healing; continued pain; recurrence of carpal tunnel syndrome; and damage to motor branch. He voiced understanding of these risks and elected to proceed.   OPERATIVE COURSE:  After being identified preoperatively by myself, the patient and I agreed upon the procedure and site of procedure.  The surgical site was marked.  Surgical consent had been signed.  He was given IV Ancef as preoperative antibiotic prophylaxis.  He was transferred to the operating room and placed on the operating room table in supine position with the Left upper extremity on an armboard.  General anesthesia was induced by the anesthesiologist.  Left upper extremity was prepped and draped in normal sterile orthopaedic fashion.  A surgical pause was performed between the surgeons, anesthesia, and operating room staff, and all were in agreement as to the patient, procedure, and site of  procedure.  Tourniquet at the proximal aspect of the extremity was inflated to 250 mmHg after exsanguination of the arm with an Esmarch bandage  Incision was made over the transverse carpal ligament and carried into the subcutaneous tissues by spreading technique.  Bipolar electrocautery was used to obtain hemostasis.  The palmar fascia was sharply incised.  The transverse carpal ligament was identified and sharply incised.  It was incised distally first.  The flexor tendons were identified.  The flexor tendon to the little finger was identified and retracted radially.  The transverse carpal ligament was then incised proximally.  Scissors were used to split the distal aspect of the volar antebrachial fascia.  A finger was placed into the wound to ensure complete decompression, which was the case.  The nerve was examined.  It was flattened and hyperemic and was adherent to the radial leaflet.  The motor branch was identified and was intact.  The wound was copiously irrigated with sterile saline.  It was then closed with 4-0 nylon in a horizontal mattress fashion.  It was injected with 0.25% plain Marcaine to aid in postoperative analgesia.  It was dressed with sterile Xeroform, 4x4s, an ABD, and wrapped with Kerlix and an Ace bandage.  Tourniquet was deflated at 17 minutes.  Fingertips were pink with brisk capillary refill after deflation of the tourniquet.  Operative drapes were broken down.  The patient was awoken from anesthesia safely.  He was transferred back to stretcher and taken to the PACU in  stable condition.  I will see him back in the office in 1 week for postoperative followup.  I will give him a prescription for oxycodone 5mg  1 po q6 hours prn breakthrough pain, dispense # 15.    [name], MD Electronically signed, 08/16/20

## 2020-08-16 NOTE — H&P (Signed)
Roberto Reid is an 87 y.o. male.   Chief Complaint: carpal tunnel syndrome HPI: 87 yo male with numbness and tingling in left hand.  Nocturnal symptoms.  Positive nerve conduction studies.  He wishes to have left carpal tunnel release.  Allergies:  Allergies  Allergen Reactions  . Oxycodone-Acetaminophen Other (See Comments)    confusion  . Percocet [Oxycodone-Acetaminophen]     Wild hallucinations. He states he has tolerated plain oxycodone in the past.   . Sulfa Antibiotics   . Sulfamethoxazole Rash    Past Medical History:  Diagnosis Date  . Arthritis    "right hand" (06/15/2016)  . Childhood asthma   . Chronic anticoagulation 08/30/2017  . DVT, lower extremity, proximal, acute, right ([place]) 08/24/2016   06/15/16  . GERD (gastroesophageal reflux disease)   . Hearing loss   . History of kidney stones   . Hyperlipidemia   . Hypertension   . Pneumonia    "I've had it ~ 9 times as an adult; last time was 04/2016" (06/15/2016)    Past Surgical History:  Procedure Laterality Date  . AMPUTATION TOE Right 09/27/2018   Procedure: Right 2nd toe amputation;  Surgeon: Wylene Simmer, MD;  Location: [place];  Service: Orthopedics;  Laterality: Right;  84min  . CATARACT EXTRACTION W/ INTRAOCULAR LENS  IMPLANT, BILATERAL Bilateral 04/2009  . ELBOW BURSA SURGERY Right   . FOOT SURGERY Right 2010   "attempted reconstruction" per pt  . JOINT REPLACEMENT    . KIDNEY STONE SURGERY    . KNEE ARTHROSCOPY Right 10/08/2014   Procedure: ARTHROSCOPY KNEE;  Surgeon: Vickey Huger, MD;  Location: [facility];  Service: Orthopedics;  Laterality: Right;  . KNEE ARTHROSCOPY Left    repair torn knee cartilage  . QUADRICEPS TENDON REPAIR Right 10/08/2014   Procedure: REPAIR QUADRICEP TENDON;  Surgeon: Vickey Huger, MD;  Location: [place];  Service: Orthopedics;  Laterality: Right;  . TONSILLECTOMY    . TOTAL KNEE ARTHROPLASTY  12/14/2011   Procedure: TOTAL KNEE ARTHROPLASTY;  Surgeon: Rudean Haskell, MD;  Location: [place];  Service: Orthopedics;  Laterality: Left;    Family History: Family History  Problem Relation Age of Onset  . Parkinson's disease Father     Social History:   reports that he quit smoking about 28 years ago. His smoking use included cigarettes. He has a 27.75 pack-year smoking history. He has never used smokeless tobacco. He reports current alcohol use of about 7.0 - 10.0 standard drinks of alcohol per week. He reports that he does not use drugs.  Medications: Medications Prior to Admission  Medication Sig Dispense Refill  . amLODipine (NORVASC) 5 MG tablet Take 1 tablet (5 mg total) by mouth daily. 90 tablet 3  . ampicillin (PRINCIPEN) 500 MG capsule Take 1 capsule (500 mg total) by mouth 3 (three) times daily. 21 capsule 0  . clonazePAM (KLONOPIN) 0.5 MG tablet TAKE 1-2 TABLETS BY MOUTH DAILY AS NEEDED FOR ANXIETY 60 tablet 3  . Coenzyme Q10 (CO Q-10 PO) Take 1 tablet by mouth daily. 300 mg    . ELIQUIS 2.5 MG TABS tablet TAKE ONE TABLET BY MOUTH TWICE A DAY (Patient taking differently: 2.5 mg 2 (two) times daily.) 60 tablet 1  . esomeprazole (NEXIUM) 40 MG capsule Take 1 capsule (40 mg total) by mouth 2 (two) times daily. 60 capsule 11  . HYDROcodone-acetaminophen (NORCO) 10-325 MG tablet Take 1 tablet by mouth every 6 (six) hours. 124 tablet 0  .  loratadine (CLARITIN) 10 MG tablet Take 10 mg by mouth daily as needed for allergies.    Marland Kitchen losartan (COZAAR) 100 MG tablet TAKE ONE TABLET BY MOUTH DAILY 90 tablet 1  . Multiple Vitamins-Minerals (MULTIVITAMIN WITH MINERALS) tablet Take 1 tablet by mouth daily.    . Omega-3 Fatty Acids (OMEGA 3 PO) Take by mouth.    . Testosterone 20.25 MG/ACT (1.62%) GEL 20.25 mg by Pump Prime route daily. Apply 6 pumps everyday as directed.    . zolpidem (AMBIEN) 10 MG tablet TAKE ONE TABLET BY MOUTH AT BEDTIME 90 tablet 0  . lubiprostone (AMITIZA) 24 MCG capsule Take 1 capsule (24 mcg total) by mouth 2 (two) times daily with a  meal. 60 capsule 11  . ondansetron (ZOFRAN) 4 MG tablet Take 1 tablet (4 mg total) by mouth every 8 (eight) hours as needed for nausea or vomiting. 20 tablet 1  . QUEtiapine (SEROQUEL) 50 MG tablet TAKE ONE TABLET BY MOUTH EVERY NIGHT AT BEDTIME 90 tablet 1    No results found for this or any previous visit (from the past 48 hour(s)).  No results found.   A comprehensive review of systems was negative.  Height 5\' 10"  (1.778 m), weight 82.6 kg.  General appearance: alert, cooperative and appears stated age Head: Normocephalic, without obvious abnormality, atraumatic Neck: supple, symmetrical, trachea midline Cardio: regular rate and rhythm Resp: clear to auscultation bilaterally Extremities: Intact sensation and capillary refill all digits though thumb/index/long feel different.  +epl/fpl/io.  No wounds.  Pulses: 2+ and symmetric Skin: Skin color, texture, turgor normal. No rashes or lesions Neurologic: Grossly normal Incision/Wound: none  Assessment/Plan Left carpal tunnel syndrome.  Non operative and operative treatment options have been discussed with the patient and patient wishes to proceed with operative treatment. Risks, benefits, and alternatives of surgery have been discussed and the patient agrees with the plan of care.   [name] 08/16/2020, 12:16 PM

## 2020-08-16 NOTE — Transfer of Care (Signed)
Immediate Anesthesia Transfer of Care Note  Patient: Roberto Reid  Procedure(s) Performed: LEFT CARPAL TUNNEL RELEASE (Left Wrist)  Patient Location: PACU  Anesthesia Type:General  Level of Consciousness: sedated  Airway & Oxygen Therapy: Patient Spontanous Breathing and Patient connected to face mask oxygen  Post-op Assessment: Report given to RN and Post -op Vital signs reviewed and stable  Post vital signs: Reviewed and stable  Last Vitals:  Vitals Value Taken Time  BP    Temp    Pulse 84 08/16/20 1403  Resp    SpO2 100 % 08/16/20 1403  Vitals shown include unvalidated device data.  Last Pain: There were no vitals filed for this visit.       Complications: No complications documented.

## 2020-08-20 ENCOUNTER — Other Ambulatory Visit: Payer: Self-pay | Admitting: Internal Medicine

## 2020-08-20 ENCOUNTER — Encounter (HOSPITAL_BASED_OUTPATIENT_CLINIC_OR_DEPARTMENT_OTHER): Payer: Self-pay | Admitting: Orthopedic Surgery

## 2020-08-22 ENCOUNTER — Other Ambulatory Visit: Payer: Self-pay

## 2020-08-22 ENCOUNTER — Ambulatory Visit (INDEPENDENT_AMBULATORY_CARE_PROVIDER_SITE_OTHER): Payer: Medicare Other | Admitting: Internal Medicine

## 2020-08-22 ENCOUNTER — Encounter: Payer: Self-pay | Admitting: Internal Medicine

## 2020-08-22 VITALS — BP 110/60 | HR 85 | Ht 70.0 in | Wt 182.0 lb

## 2020-08-22 DIAGNOSIS — F5104 Psychophysiologic insomnia: Secondary | ICD-10-CM

## 2020-08-22 DIAGNOSIS — M21961 Unspecified acquired deformity of right lower leg: Secondary | ICD-10-CM

## 2020-08-22 DIAGNOSIS — N1831 Chronic kidney disease, stage 3a: Secondary | ICD-10-CM

## 2020-08-22 DIAGNOSIS — I10 Essential (primary) hypertension: Secondary | ICD-10-CM

## 2020-08-22 DIAGNOSIS — G8929 Other chronic pain: Secondary | ICD-10-CM

## 2020-08-22 DIAGNOSIS — Z86711 Personal history of pulmonary embolism: Secondary | ICD-10-CM

## 2020-08-22 DIAGNOSIS — M79671 Pain in right foot: Secondary | ICD-10-CM

## 2020-08-22 DIAGNOSIS — E8881 Metabolic syndrome: Secondary | ICD-10-CM

## 2020-08-25 LAB — DRUG MONITORING, PANEL 8 WITH CONFIRMATION, URINE
6 Acetylmorphine: NEGATIVE ng/mL (ref <10)
Alcohol Metabolites: POSITIVE ng/mL — AB
Amphetamines: NEGATIVE ng/mL (ref <500)
Benzodiazepines: NEGATIVE ng/mL (ref <100)
Buprenorphine, Urine: NEGATIVE ng/mL (ref <5)
Cocaine Metabolite: NEGATIVE ng/mL (ref <150)
Codeine: NEGATIVE ng/mL (ref <50)
Creatinine: 108 mg/dL
Ethyl Glucuronide (ETG): 20463 ng/mL — ABNORMAL HIGH (ref <500)
Ethyl Sulfate (ETS): 3492 ng/mL — ABNORMAL HIGH (ref <100)
Hydrocodone: 2991 ng/mL — ABNORMAL HIGH (ref <50)
Hydromorphone: 1315 ng/mL — ABNORMAL HIGH (ref <50)
MDMA: NEGATIVE ng/mL (ref <500)
Marijuana Metabolite: NEGATIVE ng/mL (ref <20)
Morphine: NEGATIVE ng/mL (ref <50)
Norhydrocodone: 1964 ng/mL — ABNORMAL HIGH (ref <50)
Opiates: POSITIVE ng/mL — AB (ref <100)
Oxidant: NEGATIVE ug/mL
Oxycodone: NEGATIVE ng/mL (ref <100)
pH: 5.3 (ref 4.5–9.0)

## 2020-08-25 LAB — DM TEMPLATE

## 2020-09-09 ENCOUNTER — Other Ambulatory Visit: Payer: Self-pay | Admitting: Internal Medicine

## 2020-09-09 DIAGNOSIS — C44629 Squamous cell carcinoma of skin of left upper limb, including shoulder: Secondary | ICD-10-CM | POA: Diagnosis not present

## 2020-09-09 DIAGNOSIS — C44319 Basal cell carcinoma of skin of other parts of face: Secondary | ICD-10-CM | POA: Diagnosis not present

## 2020-09-09 MED ORDER — HYDROCODONE-ACETAMINOPHEN 10-325 MG PO TABS
1.0000 | ORAL_TABLET | Freq: Four times a day (QID) | ORAL | 0 refills | Status: DC
Start: 2020-09-09 — End: 2020-10-09

## 2020-09-09 NOTE — Telephone Encounter (Signed)
See request °

## 2020-09-09 NOTE — Telephone Encounter (Signed)
RX pended 

## 2020-09-09 NOTE — Telephone Encounter (Signed)
[name] Levels [phone]  [name] called to say he needs a refill  HYDROcodone-acetaminophen [facility]) 10-325 MG tablet  [name] at [place], [place] Phone:  [phone]  Fax:  [phone]

## 2020-09-18 DIAGNOSIS — G5603 Carpal tunnel syndrome, bilateral upper limbs: Secondary | ICD-10-CM | POA: Diagnosis not present

## 2020-09-19 ENCOUNTER — Telehealth: Payer: Self-pay | Admitting: Internal Medicine

## 2020-09-19 ENCOUNTER — Encounter: Payer: Self-pay | Admitting: Internal Medicine

## 2020-09-19 ENCOUNTER — Other Ambulatory Visit: Payer: Self-pay | Admitting: Internal Medicine

## 2020-09-19 DIAGNOSIS — M79673 Pain in unspecified foot: Secondary | ICD-10-CM

## 2020-09-19 DIAGNOSIS — G8929 Other chronic pain: Secondary | ICD-10-CM

## 2020-09-19 MED ORDER — CLONAZEPAM 0.5 MG PO TABS: ORAL_TABLET | ORAL | 3 refills | Status: DC

## 2020-09-19 MED ORDER — CLONAZEPAM 0.5 MG PO TABS
ORAL_TABLET | ORAL | 3 refills | Status: DC
Start: 2020-09-19 — End: 2021-07-14

## 2020-09-19 NOTE — Telephone Encounter (Signed)
Refill Klonopin #60 with 3 refills.

## 2020-09-19 NOTE — Telephone Encounter (Signed)
Pt calling to get a refill on clonazePAM. [facility]

## 2020-09-19 NOTE — Telephone Encounter (Signed)
Last refill 10/29/19 #60 + 3. RX pended, please sign.

## 2020-09-28 NOTE — Patient Instructions (Signed)
It was a pleasure to see you today.  Urine drug screen obtained.  Continue chronic pain medication as prescribed.  Return in 3 months.  Physical examination and Medicare wellness visit due September 2022.

## 2020-09-28 NOTE — Progress Notes (Signed)
   Subjective:    Patient ID: Roberto Reid, male    DOB: 25-Oct-1932, 87 y.o.   MRN: [id number]  HPI  87 year old Male seen for chronic pain management. Had left carpal tunnel release by Dr. [name] March 18th. Had presented with symptoms of numbness and tingling left hand.I sent him for Mr angiogram. No evidence of aneurysm or stroke.Dr. Jaynee Eagles saw him and diagnosed him with bilateral carpal tunnel syndrome. He has chronic foot pain and is on chronic narcotic medication which he takes responsibly. He does consume alcohol and this is reflected in his drug screen.  Time time he has recurrent cellulitis of his right leg treated with Levaquin.  This is been going on for a number of years and is likely due to a small break in the skin as well as venous insufficiency that triggers a cellulitis.  Generally keeps Levaquin on hand for this.  He has a history of chronic right foot pain and right second toe amputation due to osteomyelitis in April 2020.  He has a severe bunion deformity of his right foot and has to take pain medication due to the bunion deformity.  He has a history of essential hypertension, impaired glucose tolerance and history of total left knee arthroplasty in 2013.  He is on Eliquis 2.5 mg daily because of history of DVT.  He takes Ambien nightly for sleep and sometimes Klonopin for anxiety.  He has recovered from his carpal tunnel surgery.  It has been inconvenient for him.  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    Review of Systems see above-no new complaints.  Says pain control is adequate.     Objective:   Physical Exam Blood pressure 110/60 pulse 85 regular pulse oximetry 97% weight 192 pounds height 5 feet 10 inches BMI 26.11  Skin: Warm and dry.  No evidence of cellulitis.  Chest clear to auscultation.  Cardiac exam regular rate and rhythm.  No pitting edema of the lower extremities.   Affect thought and judgment are normal.       Assessment & Plan:  Encounter for chronic narcotic pain medication  Urine drug screen obtained  Continue chronic narcotic medication as previously prescribed.  No side effects and he uses medication responsibly.  He is on Norco 10/325 which she takes every 6 hours.  Chronic insomnia treated with Ambien nightly and he has taken this for years  Chronic kidney disease-continue to monitor  He is on chronic anticoagulation with Eliquis twice daily 2.5 mg with history of PE.  Takes Klonopin as needed for anxiety which is not all that often  He has essential hypertension treated with Norvasc and blood pressure is stable   Plan he will continue his current narcotic medication Norco 10/325 every 6 hours as previously prescribed.  He will return in 3 months for follow-up.  His annual physical is due in September.  Time spent reviewing his records, urine drug screen, medical decision making and seeing patient is 30 minutes

## 2020-10-06 ENCOUNTER — Telehealth: Payer: Self-pay | Admitting: Internal Medicine

## 2020-10-06 ENCOUNTER — Other Ambulatory Visit: Payer: Self-pay | Admitting: Internal Medicine

## 2020-10-06 DIAGNOSIS — N189 Chronic kidney disease, unspecified: Secondary | ICD-10-CM

## 2020-10-06 DIAGNOSIS — I824Y1 Acute embolism and thrombosis of unspecified deep veins of right proximal lower extremity: Secondary | ICD-10-CM

## 2020-10-06 DIAGNOSIS — I2782 Chronic pulmonary embolism: Secondary | ICD-10-CM

## 2020-10-06 NOTE — Telephone Encounter (Signed)
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 He and his wife went to visit family in [place]. They traveled by airplane. They left on May 2nd. He was fine then.  They returned home last night and he was chilled. His wife called This afternoon.  He has a severe sore throat, malaise, fatigue and low grade temp. Had positive Home Covid test today.  We have 2 Covid vaccines on file in [place] in January and February 2021.  Hx Left lower lobe pneumonia 2017. Hx of PE 2017. GERD  Chronic anticoagulation    Hx of recurrent cellulitis right leg but not recently.  He has a chronic foot deformity and is followed here for chronic pain management.He takes Norco 10/325 every 6 hours.  Hx right second toe amputation 2020 due to osteomyelitis.  He has essential HTN, diet controlled glucose intolerance, Chronic kidney disease,chronic insomnia and anxiety.  See extensive note December 7th 2021 for additional history.  I think he is an candidate for MAB infusion or Paxlovid. He will start Levaquin this evening and a tapering course of steroids. OV in am. Call sooner if worse.    Will send e-mail to Infusion center.   MJB,MD

## 2020-10-07 ENCOUNTER — Other Ambulatory Visit: Payer: Self-pay

## 2020-10-07 ENCOUNTER — Ambulatory Visit (INDEPENDENT_AMBULATORY_CARE_PROVIDER_SITE_OTHER): Payer: Medicare Other | Admitting: Internal Medicine

## 2020-10-07 ENCOUNTER — Encounter: Payer: Self-pay | Admitting: Internal Medicine

## 2020-10-07 VITALS — HR 88 | Temp 97.7°F

## 2020-10-07 DIAGNOSIS — E7439 Other disorders of intestinal carbohydrate absorption: Secondary | ICD-10-CM

## 2020-10-07 DIAGNOSIS — Z86711 Personal history of pulmonary embolism: Secondary | ICD-10-CM

## 2020-10-07 DIAGNOSIS — U071 COVID-19: Secondary | ICD-10-CM | POA: Diagnosis not present

## 2020-10-07 DIAGNOSIS — I10 Essential (primary) hypertension: Secondary | ICD-10-CM

## 2020-10-07 DIAGNOSIS — E8881 Metabolic syndrome: Secondary | ICD-10-CM

## 2020-10-07 DIAGNOSIS — N1831 Chronic kidney disease, stage 3a: Secondary | ICD-10-CM | POA: Diagnosis not present

## 2020-10-07 DIAGNOSIS — Z7901 Long term (current) use of anticoagulants: Secondary | ICD-10-CM

## 2020-10-07 NOTE — Progress Notes (Signed)
   Subjective:    Patient ID: Roberto Reid, male    DOB: 11/10/1932, 87 y.o.   MRN: [id number]  HPI 87 year old Male with glucose intolerance, HTN, Chronic kidney disease Stage 3a, Hx of PE on chronic anticoagulation therapy, hx of recurrent cellulitis right leg but not recently.  Patient left [place] on Tuesday, May 3 and flew to [place] returning on the evening of 97.  He felt chilled.  He woke up yesterday morning and had cough and congestion.  Time COVID test was positive.  Tells me he has had 3 COVID vaccines but not a recent booster.  We only have 2 in his chart.  Wife says they were all done through [facility].  Yesterday we started him on Levaquin and a tapering course of prednisone.  He is seen today and appears to be weak but ambulatory.  Slept in recliner last night.  He is afebrile.  When he initially got out of the car his pulse oximetry was 93% but he walked around a bit and it improved to 99%.    Review of Systems see above     Objective:   Physical Exam He is afebrile, respiratory rate is 10.  Temperature 97.7 degrees pulse 88 regular  His chest is clear to auscultation.  His TMs are clear.  He looks frail.  He is moving slowly.  Clearly does not feel well.       Assessment & Plan:  Acute COVID-19 virus infection.  We have done COVID-19 PCR testing by nasal swab today.  His home test was positive yesterday.  Plan: He is to quarantine at home.  We have contacted infusion center at [facility] health to see if he is a candidate for MAB infusion.  Referral has been placed.  For now he will continue with Levaquin and tapering course of prednisone.  Rest at home.  Drink plenty of fluids.  Monitor pulse oximetry.

## 2020-10-07 NOTE — Addendum Note (Signed)
Addended by: Mady Haagensen on: 10/07/2020 02:18 PM   Modules accepted: Orders

## 2020-10-07 NOTE — Patient Instructions (Signed)
Rest at time.  Await COVID-19 PCR test.  Referral made to monoclonal antibody infusion center.  Continue with Levaquin and prednisone.  Walk around her home and monitor your pulse oximetry carefully.  Call if symptoms worsen.  Stay well-hydrated.

## 2020-10-08 ENCOUNTER — Telehealth: Payer: Self-pay | Admitting: Physician Assistant

## 2020-10-08 ENCOUNTER — Telehealth: Payer: Self-pay | Admitting: Internal Medicine

## 2020-10-08 ENCOUNTER — Other Ambulatory Visit: Payer: Self-pay | Admitting: Physician Assistant

## 2020-10-08 LAB — SARS-COV-2 RNA,(COVID-19) QUALITATIVE NAAT: SARS CoV2 RNA: DETECTED — AB

## 2020-10-08 NOTE — Telephone Encounter (Signed)
Called to check on patient, he is doing much better today. Stated he had slight congestion, little diarrhea, URI symptoms, weak, no fever, slight SOB, O2 is 97%, he said he did sleep pretty good last night, he has not heard from infusion center. He said he would just rather take something and be done with this.

## 2020-10-08 NOTE — Telephone Encounter (Signed)
Thank you for responding!

## 2020-10-08 NOTE — Telephone Encounter (Signed)
Let him know we are sending in Paxlovid for him. Follow directions carefully.

## 2020-10-08 NOTE — Telephone Encounter (Signed)
Called [name] and let him know that RX had been sent to pharmacy and to follow directions very carefully, he verbalized understanding.

## 2020-10-08 NOTE — Telephone Encounter (Signed)
Called to discuss with patient about COVID-19 symptoms and the use of one of the available treatments for those with mild to moderate Covid symptoms and at a high risk of hospitalization.  Pt appears to qualify for outpatient treatment due to co-morbid conditions and/or a member of an at-risk group in accordance with the FDA Emergency Use Authorization.   I confirmed with pt that he received paxlovid from his PCP, which was Rx'd when the pt hadn't heard from [place] yet.   Angelena Form

## 2020-10-09 ENCOUNTER — Telehealth: Payer: Self-pay | Admitting: Internal Medicine

## 2020-10-09 ENCOUNTER — Other Ambulatory Visit: Payer: Self-pay | Admitting: Internal Medicine

## 2020-10-09 MED ORDER — HYDROCODONE-ACETAMINOPHEN 10-325 MG PO TABS: 1.0000 | ORAL_TABLET | Freq: Four times a day (QID) | ORAL | 0 refills | Status: DC

## 2020-10-09 NOTE — Telephone Encounter (Signed)
Faxed Positive COVID results to [facility] [phone] along with demographics.  First tested positive with home test on 10/06/2020

## 2020-10-09 NOTE — Telephone Encounter (Signed)
Roberto Reid [phone]  [name] called to say he needs refill on below medication.  HYDROcodone-acetaminophen ([place]) 10-325 MG tablet  [name] at [place], [place] Phone:  [phone]  Fax:  [phone]

## 2020-10-10 ENCOUNTER — Encounter: Payer: Self-pay | Admitting: Internal Medicine

## 2020-10-10 ENCOUNTER — Ambulatory Visit (INDEPENDENT_AMBULATORY_CARE_PROVIDER_SITE_OTHER): Payer: Medicare Other | Admitting: Internal Medicine

## 2020-10-10 ENCOUNTER — Other Ambulatory Visit: Payer: Self-pay

## 2020-10-10 VITALS — Temp 99.0°F

## 2020-10-10 DIAGNOSIS — N1831 Chronic kidney disease, stage 3a: Secondary | ICD-10-CM

## 2020-10-10 DIAGNOSIS — Z7901 Long term (current) use of anticoagulants: Secondary | ICD-10-CM

## 2020-10-10 DIAGNOSIS — U071 COVID-19: Secondary | ICD-10-CM

## 2020-10-10 DIAGNOSIS — E7439 Other disorders of intestinal carbohydrate absorption: Secondary | ICD-10-CM

## 2020-10-10 DIAGNOSIS — G8929 Other chronic pain: Secondary | ICD-10-CM

## 2020-10-10 DIAGNOSIS — M79671 Pain in right foot: Secondary | ICD-10-CM

## 2020-10-10 DIAGNOSIS — M21961 Unspecified acquired deformity of right lower leg: Secondary | ICD-10-CM

## 2020-10-10 DIAGNOSIS — I10 Essential (primary) hypertension: Secondary | ICD-10-CM

## 2020-10-10 DIAGNOSIS — Z86711 Personal history of pulmonary embolism: Secondary | ICD-10-CM

## 2020-10-10 LAB — BASIC METABOLIC PANEL
BUN/Creatinine Ratio: 28 (calc) — ABNORMAL HIGH (ref 6–22)
BUN: 56 mg/dL — ABNORMAL HIGH (ref 7–25)
CO2: 24 mmol/L (ref 20–32)
Calcium: 8.8 mg/dL (ref 8.6–10.3)
Chloride: 102 mmol/L (ref 98–110)
Creat: 2.02 mg/dL — ABNORMAL HIGH (ref 0.70–1.11)
Glucose, Bld: 118 mg/dL — ABNORMAL HIGH (ref 65–99)
Potassium: 4.8 mmol/L (ref 3.5–5.3)
Sodium: 136 mmol/L (ref 135–146)

## 2020-10-10 NOTE — Patient Instructions (Addendum)
We are glad you are improving.  Finish course of Paxil out of it as prescribed.  Creatinine is stable.  We would like to repeat creatinine in 4 weeks.  Medicare wellness exam is due in September.

## 2020-10-10 NOTE — Progress Notes (Signed)
   Subjective:    Patient ID: Roberto Reid, male    DOB: [birth year], 87 y.o.   MRN: [id number]  HPI 87 year old Male recently diagnosed with Covid-19 infection after trip via plane to [place] May 3 through May 7. Cough and congestion initially were noted May 8th.  He has hx CKD glucose intolerance,chronic foot pain on Norco, recent left carpal tunnel surgery, chronic anticoagulation for hx PE  He was started on Paxlovid May 10th as Covid-19 PCR test was positive on May 9th.  Pharmacist at [place] [facility] recommended we check creatinine today on Paxlovid.Dose was adjusted for renal disease. He is feeling better on Paxlovid. Appears to have more energy. Reports no significant side effects.  Wife has tested positive today for Covid 19.    Review of Systems no new complaints. [name] is stable     Objective:   Physical Exam  He is alert and oriented x3.  He appears to have more energy than he did initially when we diagnosed him with COVID-19 on May 9.  His chest is clear to auscultation.  Cardiac exam regular rate and rhythm.  Affect thought and judgment are normal.  He is alert and oriented.      Assessment & Plan:  COVID-19 virus infection improving with Bid.  Checking being met today to make sure renal functions are stable on that medication.  Medication was reduced for chronic kidney disease.  He is tolerating it without side effects and is improving.  He will finish course of Paxil even as prescribed.  Repeat creatinine in 4 weeks.  It is stable at 2.02.  Medicare wellness exam due September 2022.

## 2020-10-17 NOTE — Progress Notes (Signed)
LVM to CB and schedule appointments 

## 2020-10-27 ENCOUNTER — Other Ambulatory Visit: Payer: Self-pay | Admitting: Internal Medicine

## 2020-10-29 NOTE — Progress Notes (Signed)
Appointments scheduled

## 2020-11-05 ENCOUNTER — Telehealth: Payer: Self-pay | Admitting: Internal Medicine

## 2020-11-05 MED ORDER — HYDROCODONE-ACETAMINOPHEN 10-325 MG PO TABS: 1.0000 | ORAL_TABLET | Freq: Four times a day (QID) | ORAL | 0 refills | Status: DC

## 2020-11-05 NOTE — Telephone Encounter (Signed)
Patient may pick this Rx up on Friday June 10th as I will be away weekend of June 11th. MJB,MD

## 2020-11-07 ENCOUNTER — Other Ambulatory Visit: Payer: Self-pay | Admitting: Internal Medicine

## 2020-11-07 NOTE — Telephone Encounter (Signed)
Left detailed message.   

## 2020-11-07 NOTE — Telephone Encounter (Signed)
Roberto Reid [phone]  Roberto called to say he would be out of below medication on Saturday.  HYDROcodone-acetaminophen [facility]) 10-325 MG tablet  [name] [id number] [place], [place] - [place] Phone:  [phone]  Fax:  [phone]

## 2020-11-11 ENCOUNTER — Other Ambulatory Visit: Payer: Medicare Other | Admitting: Internal Medicine

## 2020-11-11 ENCOUNTER — Other Ambulatory Visit: Payer: Self-pay

## 2020-11-11 DIAGNOSIS — R7989 Other specified abnormal findings of blood chemistry: Secondary | ICD-10-CM

## 2020-11-11 DIAGNOSIS — R799 Abnormal finding of blood chemistry, unspecified: Secondary | ICD-10-CM

## 2020-11-11 LAB — BASIC METABOLIC PANEL
BUN/Creatinine Ratio: 13 (calc) (ref 6–22)
BUN: 24 mg/dL (ref 7–25)
CO2: 27 mmol/L (ref 20–32)
Calcium: 9 mg/dL (ref 8.6–10.3)
Chloride: 103 mmol/L (ref 98–110)
Creat: 1.81 mg/dL — ABNORMAL HIGH (ref 0.70–1.11)
Glucose, Bld: 115 mg/dL — ABNORMAL HIGH (ref 65–99)
Potassium: 4.3 mmol/L (ref 3.5–5.3)
Sodium: 138 mmol/L (ref 135–146)

## 2020-12-01 ENCOUNTER — Other Ambulatory Visit: Payer: Self-pay | Admitting: Internal Medicine

## 2020-12-05 ENCOUNTER — Other Ambulatory Visit: Payer: Self-pay | Admitting: Internal Medicine

## 2020-12-05 MED ORDER — HYDROCODONE-ACETAMINOPHEN 10-325 MG PO TABS: 1.0000 | ORAL_TABLET | Freq: Four times a day (QID) | ORAL | 0 refills | Status: DC

## 2020-12-05 NOTE — Telephone Encounter (Signed)
Roberto Reid [phone]  Roberto called to say his prescription will run out on Sunday for below medication.  HYDROcodone-acetaminophen [facility]) 10-325 MG tablet  [facility] [id number] [place], [place] - [place] Phone:  [phone]  Fax:  [phone]

## 2021-01-01 ENCOUNTER — Other Ambulatory Visit: Payer: Self-pay

## 2021-01-01 DIAGNOSIS — I2782 Chronic pulmonary embolism: Secondary | ICD-10-CM

## 2021-01-01 DIAGNOSIS — N189 Chronic kidney disease, unspecified: Secondary | ICD-10-CM

## 2021-01-01 DIAGNOSIS — I824Y1 Acute embolism and thrombosis of unspecified deep veins of right proximal lower extremity: Secondary | ICD-10-CM

## 2021-01-01 MED ORDER — APIXABAN 2.5 MG PO TABS: 2.5000 mg | ORAL_TABLET | Freq: Two times a day (BID) | ORAL | 1 refills | Status: DC

## 2021-01-03 ENCOUNTER — Other Ambulatory Visit: Payer: Self-pay

## 2021-01-03 MED ORDER — HYDROCODONE-ACETAMINOPHEN 10-325 MG PO TABS: 1.0000 | ORAL_TABLET | Freq: Four times a day (QID) | ORAL | 0 refills | Status: DC

## 2021-01-03 NOTE — Telephone Encounter (Signed)
Patient called to request a refill on [place]. Last refill 12/06/20 he said it runs out on Sunday.

## 2021-01-19 ENCOUNTER — Telehealth: Payer: Self-pay | Admitting: Internal Medicine

## 2021-01-19 NOTE — Telephone Encounter (Signed)
Patient is under pain management contract. Received request for Ambien 10 mg filled May 30 for #90. Not due for refill until August 30,2022 by my records.. Request in NINE days early. Request denied at this time. MJB, MD

## 2021-01-20 ENCOUNTER — Telehealth: Payer: Self-pay | Admitting: Internal Medicine

## 2021-01-20 NOTE — Telephone Encounter (Signed)
Called [name] to see why below medication was requested 9 days early and he said that [place] had requested it so she would not forget, she had put on there that it was not needed till the end of the month. I let them know this medication does not need to be requested till at least the day before it is needed. The pharmacy request it the same day they receive request know matter what it says and it makes it look like they are trying to get it early.  Refill needed on 01/28/21  zolpidem (AMBIEN) 10 MG tablet  [facility] [id number] - [place], [place] - [place] Phone:  [phone]  Fax:  [phone]

## 2021-01-20 NOTE — Telephone Encounter (Signed)
Called pharmacy and spoke with Merry Proud the pharmacist and let him know to cancel request that patient will be calling back for refill when it is actually due.

## 2021-01-30 ENCOUNTER — Other Ambulatory Visit: Payer: Self-pay

## 2021-01-30 MED ORDER — ZOLPIDEM TARTRATE 10 MG PO TABS: 10.0000 mg | ORAL_TABLET | Freq: Every day | ORAL | 0 refills | Status: DC

## 2021-02-04 ENCOUNTER — Encounter: Payer: Self-pay | Admitting: Internal Medicine

## 2021-02-04 ENCOUNTER — Telehealth: Payer: Self-pay | Admitting: Internal Medicine

## 2021-02-04 MED ORDER — HYDROCODONE-ACETAMINOPHEN 10-325 MG PO TABS: 1.0000 | ORAL_TABLET | Freq: Four times a day (QID) | ORAL | 0 refills | Status: DC

## 2021-02-04 NOTE — Telephone Encounter (Signed)
Pt called and said he needs a refill on HYDROcodone-acetaminophen (NORCO) 10-325 MG tablet, please advise

## 2021-02-04 NOTE — Telephone Encounter (Signed)
Per [name] patient last filled hydrocodone 8/6.

## 2021-02-16 ENCOUNTER — Other Ambulatory Visit: Payer: Self-pay | Admitting: Internal Medicine

## 2021-02-21 ENCOUNTER — Other Ambulatory Visit: Payer: Medicare Other | Admitting: Internal Medicine

## 2021-02-21 ENCOUNTER — Other Ambulatory Visit: Payer: Self-pay

## 2021-02-21 DIAGNOSIS — Z13220 Encounter for screening for lipoid disorders: Secondary | ICD-10-CM

## 2021-02-21 DIAGNOSIS — Z125 Encounter for screening for malignant neoplasm of prostate: Secondary | ICD-10-CM | POA: Diagnosis not present

## 2021-02-21 DIAGNOSIS — I10 Essential (primary) hypertension: Secondary | ICD-10-CM

## 2021-02-21 DIAGNOSIS — Z130 Encounter for screening for diseases of the blood and blood-forming organs and certain disorders involving the immune mechanism: Secondary | ICD-10-CM

## 2021-02-21 DIAGNOSIS — E119 Type 2 diabetes mellitus without complications: Secondary | ICD-10-CM

## 2021-02-21 NOTE — Progress Notes (Signed)
Lab only 

## 2021-02-22 LAB — COMPLETE METABOLIC PANEL WITH GFR
AG Ratio: 1.8 (calc) (ref 1.0–2.5)
ALT: 7 U/L — ABNORMAL LOW (ref 9–46)
AST: 11 U/L (ref 10–35)
Albumin: 4.2 g/dL (ref 3.6–5.1)
Alkaline phosphatase (APISO): 35 U/L (ref 35–144)
BUN/Creatinine Ratio: 19 (calc) (ref 6–22)
BUN: 33 mg/dL — ABNORMAL HIGH (ref 7–25)
CO2: 30 mmol/L (ref 20–32)
Calcium: 8.7 mg/dL (ref 8.6–10.3)
Chloride: 105 mmol/L (ref 98–110)
Creat: 1.72 mg/dL — ABNORMAL HIGH (ref 0.70–1.22)
Globulin: 2.3 g/dL (calc) (ref 1.9–3.7)
Glucose, Bld: 116 mg/dL — ABNORMAL HIGH (ref 65–99)
Potassium: 4.2 mmol/L (ref 3.5–5.3)
Sodium: 139 mmol/L (ref 135–146)
Total Bilirubin: 1.4 mg/dL — ABNORMAL HIGH (ref 0.2–1.2)
Total Protein: 6.5 g/dL (ref 6.1–8.1)
eGFR: 38 mL/min/{1.73_m2} — ABNORMAL LOW (ref >=60)

## 2021-02-22 LAB — CBC WITH DIFFERENTIAL/PLATELET
Absolute Monocytes: 331 cells/uL (ref 200–950)
Basophils Absolute: 29 cells/uL (ref 0–200)
Basophils Relative: 0.6 %
Eosinophils Absolute: 202 cells/uL (ref 15–500)
Eosinophils Relative: 4.2 %
HCT: 43.4 % (ref 38.5–50.0)
Hemoglobin: 14.2 g/dL (ref 13.2–17.1)
Lymphs Abs: 1301 cells/uL (ref 850–3900)
MCH: 32.6 pg (ref 27.0–33.0)
MCHC: 32.7 g/dL (ref 32.0–36.0)
MCV: 99.8 fL (ref 80.0–100.0)
MPV: 11.2 fL (ref 7.5–12.5)
Monocytes Relative: 6.9 %
Neutro Abs: 2938 cells/uL (ref 1500–7800)
Neutrophils Relative %: 61.2 %
Platelets: 168 10*3/uL (ref 140–400)
RBC: 4.35 10*6/uL (ref 4.20–5.80)
RDW: 12.6 % (ref 11.0–15.0)
Total Lymphocyte: 27.1 %
WBC: 4.8 10*3/uL (ref 3.8–10.8)

## 2021-02-22 LAB — LIPID PANEL
Cholesterol: 139 mg/dL (ref <200)
HDL: 43 mg/dL (ref >=40)
LDL Cholesterol (Calc): 82 mg/dL (calc)
Non-HDL Cholesterol (Calc): 96 mg/dL (calc) (ref <130)
Total CHOL/HDL Ratio: 3.2 (calc) (ref <5.0)
Triglycerides: 65 mg/dL (ref <150)

## 2021-02-22 LAB — HEMOGLOBIN A1C
Hgb A1c MFr Bld: 5.2 % of total Hgb (ref <5.7)
Mean Plasma Glucose: 103 mg/dL
eAG (mmol/L): 5.7 mmol/L

## 2021-02-22 LAB — PSA: PSA: 0.44 ng/mL (ref <=4.00)

## 2021-02-27 ENCOUNTER — Other Ambulatory Visit: Payer: Self-pay

## 2021-02-27 ENCOUNTER — Encounter: Payer: Self-pay | Admitting: Internal Medicine

## 2021-02-27 ENCOUNTER — Ambulatory Visit (INDEPENDENT_AMBULATORY_CARE_PROVIDER_SITE_OTHER): Payer: Medicare Other | Admitting: Internal Medicine

## 2021-02-27 VITALS — BP 128/70 | HR 97 | Temp 98.2°F | Resp 16 | Ht 70.0 in | Wt 185.0 lb

## 2021-02-27 DIAGNOSIS — M79673 Pain in unspecified foot: Secondary | ICD-10-CM

## 2021-02-27 DIAGNOSIS — Z23 Encounter for immunization: Secondary | ICD-10-CM

## 2021-02-27 DIAGNOSIS — N1831 Chronic kidney disease, stage 3a: Secondary | ICD-10-CM

## 2021-02-27 DIAGNOSIS — M79671 Pain in right foot: Secondary | ICD-10-CM

## 2021-02-27 DIAGNOSIS — Z0000 Encounter for general adult medical examination without abnormal findings: Secondary | ICD-10-CM | POA: Diagnosis not present

## 2021-02-27 DIAGNOSIS — N529 Male erectile dysfunction, unspecified: Secondary | ICD-10-CM

## 2021-02-27 DIAGNOSIS — Z7901 Long term (current) use of anticoagulants: Secondary | ICD-10-CM

## 2021-02-27 DIAGNOSIS — M21961 Unspecified acquired deformity of right lower leg: Secondary | ICD-10-CM

## 2021-02-27 DIAGNOSIS — F5104 Psychophysiologic insomnia: Secondary | ICD-10-CM

## 2021-02-27 DIAGNOSIS — G252 Other specified forms of tremor: Secondary | ICD-10-CM

## 2021-02-27 DIAGNOSIS — Z8616 Personal history of COVID-19: Secondary | ICD-10-CM

## 2021-02-27 DIAGNOSIS — E7439 Other disorders of intestinal carbohydrate absorption: Secondary | ICD-10-CM | POA: Diagnosis not present

## 2021-02-27 DIAGNOSIS — E119 Type 2 diabetes mellitus without complications: Secondary | ICD-10-CM

## 2021-02-27 DIAGNOSIS — Z86711 Personal history of pulmonary embolism: Secondary | ICD-10-CM

## 2021-02-27 DIAGNOSIS — I10 Essential (primary) hypertension: Secondary | ICD-10-CM

## 2021-02-27 DIAGNOSIS — G8929 Other chronic pain: Secondary | ICD-10-CM

## 2021-02-27 DIAGNOSIS — Z87442 Personal history of urinary calculi: Secondary | ICD-10-CM

## 2021-02-27 MED ORDER — LEVOFLOXACIN 500 MG PO TABS: 500.0000 mg | ORAL_TABLET | Freq: Every day | ORAL | 2 refills | Status: AC

## 2021-02-27 NOTE — Progress Notes (Signed)
[place]   Subjective:    Patient ID: Roberto Reid, male    DOB: 1932-10-11, 87 y.o.   MRN: [id number]  HPI 87 year old Male seen for health maintenance exam and evaluation of medical issues in addition to Medicare wellness visit.  He is seen here for chronic pain management every 3 months.  He takes hydrocodone APAP appropriately.  He has chronic foot pain.  Had right second toe amputation in 2020 due to osteomyelitis.  He has a severe bunion deformity of his right foot.  He had an ulcer on the second toe for many months.  MRI showed osteomyelitis of the distal phalanx right second toe.  Dr. Doran Durand performed an amputation.  He takes his pain medication responsibly.  He has regular urine drug screens through this office.  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  He went to see Dr. Para March at [facility] and had a procedure that initially seemed to help but did not give him long-term pain relief.  He went to see Dr. Ouida Sills in [place] and Dr. Ouida Sills did not think it would be wise to operate on his foot.  Fractured ankle 1953 secondary to a football injury.  Fractured ribs in the remote past.  History of recurrent cellulitis of his right leg related to an apparent spider bite in 2008 but is likely due to venous insufficiency and minor trauma causing cellulitis.  He has recurrent flareups from time to time requiring antibiotic treatment.  He has a history of essential hypertension, controlled type 2 diabetes mellitus, low testosterone, erectile dysfunction.  History of left knee arthroplasty in 2013.  History of GE reflux treated with Nexium.  History of hyperlipidemia treated with Lipitor.  Issues with chronic insomnia treated with Ambien and Seroquel.  History of anxiety treated with Klonopin.  He has been treated with AndroGel for low testosterone and in the past.  He had left lower lobe pneumonia in  2017 and developed shortness of breath.  He was hospitalized and was found to have a pulmonary embolus which was treated with Eliquis.  He saw Hematologist who advised him to continue with anticoagulation indefinitely.  History of cluster headaches in 2001 but none recently.  He saw Dr. Erling Cruz at that time.  Has been evaluated by Dr. Lia Foyer, cardiologist, in the past.  Cardiolite study in 2008 was negative.  History of hearing loss.  Hearing test in 2010 revealed mild to moderate high-frequency hearing loss bilaterally.  History of dyshidrotic hand eczema.  Allergy skin testing done in 2010 showed positive test to cats and some molds.  Minimal reactivity to dog.  Spirometry was normal.  History of GE reflux and osteoarthritis.  Cataract extraction both eyes November 2010.  Left knee surgery done by Dr. Onnie Graham for torn cartilage November 2005.  Hospitalized in [place] September 2011 with right leg cellulitis and treated with IV antibiotics.  Hypertension has been treated with losartan and is stable.  History of Barrett's esophagus and adenomatous colon polyp diagnosed in 2008.  Right kidney stone in 1997 and in 2007.  History of uric acid stones 2007 treated by Dr. Jeffie Pollock.  Left knee surgery done by Dr. Onnie Graham for torn cartilage November 2005.  Hospitalized in [place] September 2011 with right leg cellulitis treated with IV antibiotics.  He plays golf for exercise.  He has history of elevated serum creatinine consistent with chronic kidney disease but has declined nephrology consultation.  He had ultrasound of the kidneys in 2017.  There was a suspicion for a mass in the interpolar region of the right kidney.  He had an MRI of the right kidney showing small bilateral renal cyst and no worrisome renal lesions.  Reasons for chronic kidney disease include hypertension and diabetes.  In the Spring 2016 while on trip to [place], he suffered a fall down some marble steps in [place].  He  suffered a right quadriceps tendon rupture.  He also had right knee torn medial meniscus.  Subsequently had complex repair by Dr. Ronnie Derby in 2016.  He had considerable rehab and took some time to get over that injury.    Review of Systems chronic right foot pain-makes it painful to exercise such as playing golf but it is tolerable with hydrocodone APAP.  At one point we referred him to a chronic pain management clinic.  They wanted him evaluated for sleep apnea which he did not want to do.  He prefers that I prescribe his chronic pain medication.  Therefore he is seen here every 3 months and is compliant with pain management regimen.  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  He is married.  This is his second marriage.  No children from current marriage.  Currently non-smoker.  Social alcohol consumption.  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  Family history: Father died at age 45 with Parkinson's disease.  Father had history of diabetes.  Mother died at age 31 of unknown causes.  2 sisters.  A son and a daughter in good health.     Objective:   Physical Exam Vital signs reviewed and are stable.  Blood pressure excellent at 128/70.  BMI 26.54 pulse oximetry 97%  Skin: Warm and dry.  No cervical adenopathy.  No carotid bruits.  Chest is clear to auscultation.  Cardiac exam: Regular rate and rhythm without ectopy.  Abdomen soft nondistended without hepatosplenomegaly masses or tenderness.  No lower extremity pitting edema.  Chronic right foot deformity noted and is unchanged.  He has a mild tremor.  No cogwheel rigidity.  No gross focal deficits otherwise on brief neurological exam.  Affect thought and judgment are normal.       Assessment & Plan:  Patient is concerned about  his tremor.  Family history of Parkinson's disease in his father.  We will ask for Neurology consultation.  Chronic foot deformity requiring chronic pain medication.  He is monitored here regularly every 3 months.  Receives hydrocodone APAP 10/325 number 124 tablets monthly.  Essential hypertension-stable on treatment  Chronic kidney disease-has declined nephrology consultation.  Chronic insomnia treated with Seroquel and Ambien  History of anxiety treated with Klonopin  History of PE on chronic anticoagulation with Eliquis  GE reflux treated with Nexium  History of constipation likely due to medications treated with Amitiza  History of COVID-19-had mild case and recovered without incident.  He took Paxlovid.  History of community-acquired pneumonia on trip to [place] 2017  History of low testosterone  History of bilateral carpal tunnel syndrome and has seen Dr. Jaynee Eagles.  Had surgery by Dr. [name] for left carpal tunnel syndrome March 2022  Chronic pain management with hydrocodone APAP for chronic foot deformity.  Seeing regularly for pain  management visit every 3 months.  Had urine drug screen in September.  Hearing loss  Plan: He will continue with chronic pain medications as previously prescribed.  We will refer him back to Dr. Jaynee Eagles for evaluation of his tremor.  I feel this most likely is an essential tremor but he has family history of Parkinson's disease and is concerned.   Subjective:   Patient presents for Medicare Annual/Subsequent preventive examination.  Review Past Medical/Family/Social: See above   Risk Factors  Current exercise habits: Plays golf and is physically active Dietary issues discussed: Yes  Cardiac risk factors: Diabetes and hyperlipidemia  Depression Screen  (Note: if answer to either of the following is "Yes", a more complete depression screening is indicated)   Over the past two weeks, have you felt down, depressed or hopeless? No   Over the past two weeks, have you felt little interest or pleasure in doing things? No Have you lost interest or pleasure in daily life? No Do you often feel hopeless? No Do you cry easily over simple problems? No   Activities of Daily Living  In your present state of health, do you have any difficulty performing the following activities?:   Driving? No  Managing money? No  Feeding yourself? No  Getting from bed to chair? No  Climbing a flight of stairs? No  Preparing food and eating?: No  Bathing or showering? No  Getting dressed: No  Getting to the toilet? No  Using the toilet:No  Moving around from place to place: Sometimes due to chronic foot pain In the past year have you fallen or had a near fall?:No  Are you sexually active?  Yes Do you have more than one partner? No   Hearing Difficulties: No  Do you often ask people to speak up or repeat themselves?  Yes Do you experience ringing or noises in your ears? No  Do you have difficulty understanding soft or whispered voices? No  Do you feel that you have a problem with memory? No Do you often misplace items? No    Home Safety:  Do you have a smoke alarm at your residence? Yes Do you have grab bars in the bathroom?  No Do you have throw rugs in your house?  yes   Cognitive Testing  Alert? Yes Normal Appearance?Yes  Oriented to person? Yes Place? Yes  Time? Yes  Recall of three objects? Yes  Can perform simple calculations? Yes  Displays appropriate judgment?Yes  Can read the correct time from a watch face?Yes   List the Names of Other Physician/Practitioners you currently use:  See referral list for the physicians patient is currently seeing.     Review of Systems: See above   Objective:     General appearance: Appears younger than stated age Head: Normocephalic, without obvious abnormality, atraumatic  Eyes: conj clear, EOMi PEERLA  Ears: normal TM's and external ear canals both ears  Nose: Nares  normal. Septum midline. Mucosa normal. No drainage or sinus tenderness.  Throat: lips, mucosa, and tongue normal; teeth and gums normal  Neck: no adenopathy, no carotid bruit, no JVD, supple, symmetrical, trachea midline and thyroid not enlarged, symmetric, no tenderness/mass/nodules  No CVA tenderness.  Lungs: clear to auscultation bilaterally  Breasts: normal appearance, no masses or tenderness Heart: regular rate and rhythm, S1, S2 normal, no murmur, click, rub or gallop  Abdomen: soft, non-tender; bowel sounds normal; no masses, no organomegaly  Musculoskeletal: ROM normal in all joints, no crepitus, no  deformity, Normal muscle strengthen. Back  is symmetric, no curvature. Skin: Skin color, texture, turgor normal. No rashes or lesions  Lymph nodes: Cervical, supraclavicular, and axillary nodes normal.  Neurologic: CN 2 -12 Normal, Normal symmetric reflexes. Normal coordination and gait.  He has a fine tremor.  No cogwheel rigidity Psych: Alert & Oriented x 3, Mood appear stable.    Assessment:    Annual wellness medicare exam   Plan:    During the course of the visit the patient was educated and counseled about appropriate screening and preventive services including:   Recommend COVID booster  Recommend flu vaccine  Has had pneumococcal 23 and Prevnar 13  No recent tetanus immunization.  Medicare will not pay for it unless he is injured.  Last tetanus immunization was 2010.  He will continue with chronic pain management visits every 3 months.     Patient Instructions (the written plan) was given to the patient.  Medicare Attestation  I have personally reviewed:  The patient's medical and social history  Their use of alcohol, tobacco or illicit drugs  Their current medications and supplements  The patient's functional ability including ADLs,fall risks, home safety risks, cognitive, and hearing and visual impairment  Diet and physical activities  Evidence for depression  or mood disorders  The patient's weight, height, BMI, and visual acuity have been recorded in the chart. I have made referrals, counseling, and provided education to the patient based on review of the above and I have provided the patient with a written personalized care plan for preventive services.

## 2021-03-02 LAB — DRUG MONITORING, PANEL 8 WITH CONFIRMATION, URINE
6 Acetylmorphine: NEGATIVE ng/mL (ref <10)
Alcohol Metabolites: POSITIVE ng/mL — AB (ref <500)
Amphetamines: NEGATIVE ng/mL (ref <500)
Benzodiazepines: NEGATIVE ng/mL (ref <100)
Buprenorphine, Urine: NEGATIVE ng/mL (ref <5)
Cocaine Metabolite: NEGATIVE ng/mL (ref <150)
Codeine: NEGATIVE ng/mL (ref <50)
Creatinine: 64 mg/dL (ref >=20.0)
Ethyl Glucuronide (ETG): 4416 ng/mL — ABNORMAL HIGH (ref <500)
Ethyl Sulfate (ETS): 830 ng/mL — ABNORMAL HIGH (ref <100)
Hydrocodone: 1609 ng/mL — ABNORMAL HIGH (ref <50)
Hydromorphone: 981 ng/mL — ABNORMAL HIGH (ref <50)
MDMA: NEGATIVE ng/mL (ref <500)
Marijuana Metabolite: NEGATIVE ng/mL (ref <20)
Morphine: NEGATIVE ng/mL (ref <50)
Norhydrocodone: 1517 ng/mL — ABNORMAL HIGH (ref <50)
Opiates: POSITIVE ng/mL — AB (ref <100)
Oxidant: NEGATIVE ug/mL (ref <200)
Oxycodone: NEGATIVE ng/mL (ref <100)
pH: 5.5 (ref 4.5–9.0)

## 2021-03-02 LAB — DM TEMPLATE

## 2021-03-04 ENCOUNTER — Other Ambulatory Visit: Payer: Self-pay | Admitting: Internal Medicine

## 2021-03-04 NOTE — Telephone Encounter (Signed)
Roberto Reid [phone]  [name] called he will need refill on Thursday for below medication.  HYDROcodone-acetaminophen [facility]) 10-325 MG tablet  [facility] [id number] [place], [place] - [place] Phone:  [phone]  Fax:  [phone]

## 2021-03-05 MED ORDER — HYDROCODONE-ACETAMINOPHEN 10-325 MG PO TABS: 1.0000 | ORAL_TABLET | Freq: Four times a day (QID) | ORAL | 0 refills | Status: DC

## 2021-03-05 NOTE — Telephone Encounter (Signed)
Medication pended and routed to Provider.

## 2021-03-09 ENCOUNTER — Other Ambulatory Visit: Payer: Self-pay | Admitting: Internal Medicine

## 2021-03-09 DIAGNOSIS — I2782 Chronic pulmonary embolism: Secondary | ICD-10-CM

## 2021-03-09 DIAGNOSIS — I824Y1 Acute embolism and thrombosis of unspecified deep veins of right proximal lower extremity: Secondary | ICD-10-CM

## 2021-03-09 DIAGNOSIS — N189 Chronic kidney disease, unspecified: Secondary | ICD-10-CM

## 2021-03-20 DIAGNOSIS — Z23 Encounter for immunization: Secondary | ICD-10-CM | POA: Diagnosis not present

## 2021-03-25 ENCOUNTER — Telehealth: Payer: Self-pay

## 2021-03-25 NOTE — Telephone Encounter (Signed)
Patient states that he has several symptoms that he wants to discuss with you. One which is his tremors. He asked that you call him. I told him he would need an appt.  He would like an appt soon but tomorrow does not work. Please advise.

## 2021-03-26 NOTE — Telephone Encounter (Signed)
scheduled

## 2021-03-31 ENCOUNTER — Ambulatory Visit (INDEPENDENT_AMBULATORY_CARE_PROVIDER_SITE_OTHER): Payer: Medicare Other | Admitting: Internal Medicine

## 2021-03-31 ENCOUNTER — Other Ambulatory Visit: Payer: Self-pay

## 2021-03-31 ENCOUNTER — Encounter: Payer: Self-pay | Admitting: Internal Medicine

## 2021-03-31 VITALS — BP 118/64 | HR 76 | Temp 97.7°F | Ht 70.0 in | Wt 184.0 lb

## 2021-03-31 DIAGNOSIS — G250 Essential tremor: Secondary | ICD-10-CM
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
## 2021-03-31 NOTE — Patient Instructions (Signed)
Referral to be made to Dr. Jaynee Eagles whom he has seen before for evaluation of slight tremor.  Patient would like to be reassured he does not have Parkinson's disease.  He has chronic insomnia.

## 2021-04-04 ENCOUNTER — Other Ambulatory Visit: Payer: Self-pay | Admitting: Internal Medicine

## 2021-04-04 MED ORDER — HYDROCODONE-ACETAMINOPHEN 10-325 MG PO TABS: 1.0000 | ORAL_TABLET | Freq: Four times a day (QID) | ORAL | 0 refills | Status: DC

## 2021-04-04 NOTE — Telephone Encounter (Signed)
Roberto Reid [phone]  Roberto called to say he will be out of below medication tomorrow.  HYDROcodone-acetaminophen [facility]) 10-325 MG tablet  [facility] [id number] [place], [place] - [place] Phone:  [phone]  Fax:  [phone]

## 2021-04-20 NOTE — Patient Instructions (Addendum)
It was a pleasure to see you today.  Referring you to Dr. Jaynee Eagles for evaluation of your tremor.  Return here in 3 months for chronic pain management visit.  Continue current medications.  Flu vaccine given.

## 2021-05-01 ENCOUNTER — Other Ambulatory Visit: Payer: Self-pay | Admitting: Internal Medicine

## 2021-05-02 ENCOUNTER — Telehealth: Payer: Self-pay | Admitting: Internal Medicine

## 2021-05-02 MED ORDER — HYDROCODONE-ACETAMINOPHEN 10-325 MG PO TABS: 1.0000 | ORAL_TABLET | Freq: Four times a day (QID) | ORAL | 0 refills | Status: DC

## 2021-05-02 NOTE — Telephone Encounter (Signed)
Roberto Reid [phone]  [name] called to say he will need refill on Sunday for below medication.  HYDROcodone-acetaminophen [facility]) 10-325 MG tablet  [facility] [id number] [place], [place] - [place] Phone:  [phone]  Fax:  [phone]

## 2021-05-02 NOTE — Telephone Encounter (Signed)
Refill Norco as requested for one month to be given on December 4th.Patient takes meds as directed and is responsible with follow up appts. MJB, MD

## 2021-05-12 ENCOUNTER — Other Ambulatory Visit: Payer: Self-pay | Admitting: Internal Medicine

## 2021-05-12 DIAGNOSIS — R200 Anesthesia of skin: Secondary | ICD-10-CM

## 2021-05-12 DIAGNOSIS — R202 Paresthesia of skin: Secondary | ICD-10-CM

## 2021-05-28 ENCOUNTER — Other Ambulatory Visit: Payer: Self-pay | Admitting: Internal Medicine

## 2021-05-28 DIAGNOSIS — I824Y1 Acute embolism and thrombosis of unspecified deep veins of right proximal lower extremity: Secondary | ICD-10-CM

## 2021-05-28 DIAGNOSIS — N189 Chronic kidney disease, unspecified: Secondary | ICD-10-CM

## 2021-05-28 DIAGNOSIS — I2782 Chronic pulmonary embolism: Secondary | ICD-10-CM

## 2021-06-05 ENCOUNTER — Other Ambulatory Visit: Payer: Self-pay | Admitting: Internal Medicine

## 2021-06-05 MED ORDER — HYDROCODONE-ACETAMINOPHEN 10-325 MG PO TABS: 1.0000 | ORAL_TABLET | Freq: Four times a day (QID) | ORAL | 0 refills | Status: DC

## 2021-06-05 NOTE — Telephone Encounter (Signed)
Monroe Qin [phone]  [name] called to say he needs a refill on below medication.  HYDROcodone-acetaminophen [facility]) 10-325 MG tablet  [facility] [id number] [place], [place] - [place] Phone:  [phone]  Fax:  [phone]

## 2021-06-08 ENCOUNTER — Other Ambulatory Visit: Payer: Self-pay | Admitting: Internal Medicine

## 2021-06-09 ENCOUNTER — Other Ambulatory Visit: Payer: Self-pay

## 2021-06-09 ENCOUNTER — Encounter: Payer: Self-pay | Admitting: Internal Medicine

## 2021-06-09 ENCOUNTER — Ambulatory Visit (INDEPENDENT_AMBULATORY_CARE_PROVIDER_SITE_OTHER): Payer: Medicare Other | Admitting: Internal Medicine

## 2021-06-09 VITALS — BP 120/70 | Temp 97.3°F

## 2021-06-09 DIAGNOSIS — Z0289 Encounter for other administrative examinations: Secondary | ICD-10-CM

## 2021-06-09 DIAGNOSIS — M79671 Pain in right foot: Secondary | ICD-10-CM

## 2021-06-09 DIAGNOSIS — J069 Acute upper respiratory infection, unspecified: Secondary | ICD-10-CM | POA: Diagnosis not present

## 2021-06-09 DIAGNOSIS — E7439 Other disorders of intestinal carbohydrate absorption: Secondary | ICD-10-CM | POA: Diagnosis not present

## 2021-06-09 DIAGNOSIS — R52 Pain, unspecified: Secondary | ICD-10-CM

## 2021-06-09 DIAGNOSIS — G8929 Other chronic pain: Secondary | ICD-10-CM

## 2021-06-09 MED ORDER — LEVOFLOXACIN 500 MG PO TABS: 500.0000 mg | ORAL_TABLET | Freq: Every day | ORAL | 0 refills | Status: DC

## 2021-06-09 NOTE — Progress Notes (Signed)
° °  Subjective:    Patient ID: Roberto Reid, male    DOB: [birth year], 88 y.o.   MRN: [id number]  HPI 88 year old Male seen for chronic pain management.  He is seen every 3 months.  He takes hydrocodone APAP appropriately.  Longstanding history of chronic right foot pain.  Had right second toe amputation in 2020 due to osteomyelitis.  He has severe bunion deformity of his right foot.  History of osteomyelitis distal phalanx right second toe and Dr. Doran Durand performed an amputation.  He has regular urine drug screens through this office.  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  He went to see Dr. Para March at [facility] and had a procedure that initially seemed to help but did not give him long-term pain relief.  He went to see Dr. Ouida Sills in [place] and Dr. Ouida Sills did not think it would be wise to operate on his foot.  He has recurrent cellulitis of his right leg related to an apparent spider bite in 2008 but is likely due to venous insufficiency and minor trauma causing cellulitis.  He has recurrent flareups from time to time.    He has a history of essential hypertension, controlled type 2 diabetes mellitus, history of left knee arthroplasty in 2013, hyperlipidemia and GE reflux treated with Nexium.  He needs handicap parking permit signed and this was completed and signed today.  Copy of this permit is in his chart.  He also has a respiratory infection today.  He has cough and congestion but no fever or shaking chills.    Review of Systems see above     Objective:   Physical Exam  Temperature is 97.3 degrees blood pressure 120/70 pulse oximetry 97%  Skin: Warm and dry.  No cervical adenopathy.  Pharynx very slightly injected.  TMs clear.  Chest is clear to auscultation without rales or wheezing.  No cellulitis of the right lower extremity.  Pain management is adequate at the present time according to  patient.  Obtain urine drug screen at next visit    Assessment & Plan:   Chronic pain management  Acute upper respiratory infection  History of recurrent cellulitis of right lower extremity-no recurrence today  Handicap parking permit completed  Essential hypertension stable  Anxiety and insomnia treated with Klonopin as needed and Ambien at night  Chronic anticoagulation for history of pulmonary embolus maintained on Eliquis 2.5 mg daily  Plan: Levaquin 500 mg daily for 10 days.    Okay to refill Norco 10/325  # 124 tablets.  This was done on January 5.    Return in 3 months.  Urine drug screen to be done at next visit.  Handicap parking permit completed.

## 2021-06-09 NOTE — Patient Instructions (Signed)
Pain control is adequate. Has acute URI and will prescribe 10 day course of Levaquin. Will get urine drug screen at next visit. Handicapped parking permit completed.

## 2021-06-11 ENCOUNTER — Ambulatory Visit (INDEPENDENT_AMBULATORY_CARE_PROVIDER_SITE_OTHER): Payer: Self-pay | Admitting: Neurology

## 2021-06-11 ENCOUNTER — Encounter: Payer: Self-pay | Admitting: Neurology

## 2021-06-11 DIAGNOSIS — Z91199 Patient's noncompliance with other medical treatment and regimen due to unspecified reason: Secondary | ICD-10-CM

## 2021-06-11 NOTE — Progress Notes (Signed)
Patient no showed appointment today.

## 2021-06-12 ENCOUNTER — Other Ambulatory Visit: Payer: Self-pay | Admitting: Internal Medicine

## 2021-07-07 ENCOUNTER — Other Ambulatory Visit: Payer: Self-pay

## 2021-07-07 MED ORDER — HYDROCODONE-ACETAMINOPHEN 10-325 MG PO TABS: 1.0000 | ORAL_TABLET | Freq: Four times a day (QID) | ORAL | 0 refills | Status: DC

## 2021-07-14 ENCOUNTER — Other Ambulatory Visit: Payer: Self-pay | Admitting: Internal Medicine

## 2021-07-14 DIAGNOSIS — M79673 Pain in unspecified foot: Secondary | ICD-10-CM

## 2021-07-14 DIAGNOSIS — G8929 Other chronic pain: Secondary | ICD-10-CM

## 2021-08-04 ENCOUNTER — Other Ambulatory Visit: Payer: Self-pay

## 2021-08-04 MED ORDER — HYDROCODONE-ACETAMINOPHEN 10-325 MG PO TABS: 1.0000 | ORAL_TABLET | Freq: Four times a day (QID) | ORAL | 0 refills | Status: DC

## 2021-08-20 DIAGNOSIS — E291 Testicular hypofunction: Secondary | ICD-10-CM | POA: Diagnosis not present

## 2021-08-20 DIAGNOSIS — N5201 Erectile dysfunction due to arterial insufficiency: Secondary | ICD-10-CM | POA: Diagnosis not present

## 2021-08-25 ENCOUNTER — Other Ambulatory Visit: Payer: Self-pay | Admitting: Internal Medicine

## 2021-09-09 ENCOUNTER — Telehealth: Payer: Self-pay | Admitting: Internal Medicine

## 2021-09-09 MED ORDER — HYDROCODONE-ACETAMINOPHEN 10-325 MG PO TABS: 1.0000 | ORAL_TABLET | Freq: Four times a day (QID) | ORAL | 0 refills | Status: DC

## 2021-09-09 NOTE — Telephone Encounter (Signed)
Refill Hydrocodone APAP 10/325 as requested. Patient takes medication responsibly. PMP Aware checked with this refill. Has upcoming appt April 13th. ?

## 2021-09-09 NOTE — Telephone Encounter (Signed)
Caller Name: Yovani Cogburn ?Call back phone #: [phone] ? ?MEDICATION(S): HYDROcodone-acetaminophen (NORCO) 10-325 MG tablet ? ? ? ?Has the patient contacted their pharmacy (YES/NO)?  No because its controlled  ?IF YES, when and what did the pharmacy advise?  ?IF NO, request that the patient contact the pharmacy for the refills in the future.  ?           The pharmacy will send an electronic request (except for controlled medications). ? ?Preferred Pharmacy: [name] at [facility]  ? ?~~~Please advise patient/caregiver to allow 2-3 business days to process RX refills.  ?

## 2021-09-11 ENCOUNTER — Encounter: Payer: Self-pay | Admitting: Internal Medicine

## 2021-09-11 ENCOUNTER — Ambulatory Visit (INDEPENDENT_AMBULATORY_CARE_PROVIDER_SITE_OTHER): Payer: Medicare Other | Admitting: Internal Medicine

## 2021-09-11 VITALS — BP 122/64 | HR 70 | Temp 97.9°F | Ht 70.0 in | Wt 182.5 lb

## 2021-09-11 DIAGNOSIS — I10 Essential (primary) hypertension: Secondary | ICD-10-CM | POA: Diagnosis not present

## 2021-09-11 DIAGNOSIS — M21961 Unspecified acquired deformity of right lower leg: Secondary | ICD-10-CM

## 2021-09-11 DIAGNOSIS — Z7901 Long term (current) use of anticoagulants: Secondary | ICD-10-CM

## 2021-09-11 DIAGNOSIS — G8929 Other chronic pain: Secondary | ICD-10-CM | POA: Diagnosis not present

## 2021-09-11 DIAGNOSIS — R52 Pain, unspecified: Secondary | ICD-10-CM

## 2021-09-11 DIAGNOSIS — N1831 Chronic kidney disease, stage 3a: Secondary | ICD-10-CM

## 2021-09-11 DIAGNOSIS — Z7189 Other specified counseling: Secondary | ICD-10-CM | POA: Diagnosis not present

## 2021-09-11 DIAGNOSIS — M79671 Pain in right foot: Secondary | ICD-10-CM

## 2021-09-11 NOTE — Progress Notes (Signed)
? ?  Subjective:  ? ? Patient ID: Roberto Reid, male    DOB: 1932/12/15, 88 y.o.   MRN: [id number] ? ?HPI 88 year old Male seen today for chronic pain management.  He is seen every 3 months for chronic pain management.  He takes hydrocodone APAP appropriately.  He has chronic foot pain.  He had a right second toe amputation in 2020 due to osteomyelitis.  He has a severe bunion deformity of his right foot.  At one point he had an ulcer on the second toe for many months.  MRI showed osteomyelitis of the distal phalanx of the right second toe.  Dr. Berdie Ogren performed an amputation.  He takes his pain medication responsibly.  He has regular urine drug screens through this office. ? ?He has an old Lisfranc fracture subluxation with instability of the first and second tarsometatarsal joints causing chronic pain.  He saw Dr. Para March at [facility] and had a procedure that initially seemed to help but did not give him long-term pain relief.  He also went to see Dr. Ouida Sills in [place] and Dr. Ouida Sills did not think it would be wise for him to have more surgery on his foot. ? ?He had a fractured ankle in 1953 secondary to a football injury. ? ?He has recurrent cellulitis of right leg from time to time.  This requires antibiotic treatment. ? ?History of left knee arthroplasty in 2013. ? ?He also has chronic insomnia treated with Ambien and Seroquel. ? ?He has anxiety treated with Klonopin. ? ? ? ?Review of Systems everyday around 6 pm pain get worse. Takes another Hydrocodone tab at that time. ?Still playing golf- 9 holes at a time. ? ?   ?Objective:  ? Physical Exam ?His foot deformity is unchanged.  He has no pitting edema of the right lower extremity.  He has chronic venous stasis changes on both lower legs anteriorly.  His chest is clear.  Cardiac exam: Regular rate and rhythm.  His affect thought and judgment are entirely normal. ? ? ? ?   ?Assessment & Plan:  ?Chronic foot deformity right lower extremity requiring chronic pain  management with hydrocodone APAP which patient takes responsibly.  Urine drug screen obtained. ? ?He does consume alcohol socially.  This is reflected in his drug screen. ? ?Chronic insomnia ? ?Plan: He will continue with current medications including Klonopin 0.5 mg 1 or 2 tablets if needed for anxiety.  He will continue Seroquel at bedtime and Ambien at bedtime.  He will continue with hydrocodone APAP 10/325 every 6 hours for chronic foot pain.  He will return in 3 months. ? ?

## 2021-09-11 NOTE — Patient Instructions (Addendum)
It was a pleasure to see you today. We are glad you are doing reasonably well.  Continue hydrocodone APAP as prescribed.  Follow-up in 3 months.  Urine drug screen obtained today. ?

## 2021-09-17 LAB — DRUG MONITORING, PANEL 8 WITH CONFIRMATION, URINE
6 Acetylmorphine: NEGATIVE ng/mL (ref <10)
Alcohol Metabolites: POSITIVE ng/mL — AB (ref <500)
Amphetamines: NEGATIVE ng/mL (ref <500)
Benzodiazepines: NEGATIVE ng/mL (ref <100)
Buprenorphine, Urine: NEGATIVE ng/mL (ref <5)
Cocaine Metabolite: NEGATIVE ng/mL (ref <150)
Codeine: NEGATIVE ng/mL (ref <50)
Creatinine: 117.2 mg/dL (ref >=20.0)
Ethyl Glucuronide (ETG): >10000 ng/mL — ABNORMAL HIGH (ref <500)
Ethyl Sulfate (ETS): 7886 ng/mL — ABNORMAL HIGH (ref <100)
Hydrocodone: 3410 ng/mL — ABNORMAL HIGH (ref <50)
Hydromorphone: 1597 ng/mL — ABNORMAL HIGH (ref <50)
MDMA: NEGATIVE ng/mL (ref <500)
Marijuana Metabolite: NEGATIVE ng/mL (ref <20)
Morphine: NEGATIVE ng/mL (ref <50)
Norhydrocodone: 2813 ng/mL — ABNORMAL HIGH (ref <50)
Opiates: POSITIVE ng/mL — AB (ref <100)
Oxidant: NEGATIVE ug/mL (ref <200)
Oxycodone: NEGATIVE ng/mL (ref <100)
pH: 5.5 (ref 4.5–9.0)

## 2021-09-17 LAB — DM TEMPLATE

## 2021-09-18 DIAGNOSIS — H52203 Unspecified astigmatism, bilateral: Secondary | ICD-10-CM | POA: Diagnosis not present

## 2021-09-18 LAB — HM DIABETES EYE EXAM

## 2021-10-02 DIAGNOSIS — H04123 Dry eye syndrome of bilateral lacrimal glands: Secondary | ICD-10-CM | POA: Diagnosis not present

## 2021-10-02 DIAGNOSIS — H26493 Other secondary cataract, bilateral: Secondary | ICD-10-CM | POA: Diagnosis not present

## 2021-10-02 DIAGNOSIS — Z961 Presence of intraocular lens: Secondary | ICD-10-CM | POA: Diagnosis not present

## 2021-10-08 ENCOUNTER — Telehealth: Payer: Self-pay | Admitting: Internal Medicine

## 2021-10-08 NOTE — Telephone Encounter (Signed)
Roberto Reid ?[phone] ? ?Roberto called to say that he needs refill on below medication, it runs out tomorrow. ? ?HYDROcodone-acetaminophen (NORCO) 10-325 MG tablet ? ? [name] [id number] - [place], [place] - [address] Phone:  [phone]  ?Fax:  [phone]  ?  ? ?

## 2021-10-09 ENCOUNTER — Encounter: Payer: Self-pay | Admitting: Internal Medicine

## 2021-10-09 ENCOUNTER — Telehealth: Payer: Self-pay | Admitting: Internal Medicine

## 2021-10-09 MED ORDER — HYDROCODONE-ACETAMINOPHEN 10-325 MG PO TABS: 1.0000 | ORAL_TABLET | Freq: Four times a day (QID) | ORAL | 0 refills | Status: DC

## 2021-10-09 NOTE — Telephone Encounter (Signed)
Refill Norco 10,325  as prescribed. PMP aware checked. Has appt here in July. He takes med responsibly and accurately. MJB, MD ?

## 2021-10-28 ENCOUNTER — Other Ambulatory Visit: Payer: Self-pay | Admitting: Internal Medicine

## 2021-10-30 DIAGNOSIS — H26491 Other secondary cataract, right eye: Secondary | ICD-10-CM | POA: Diagnosis not present

## 2021-10-30 DIAGNOSIS — H26492 Other secondary cataract, left eye: Secondary | ICD-10-CM | POA: Diagnosis not present

## 2021-11-10 ENCOUNTER — Encounter: Payer: Self-pay | Admitting: Internal Medicine

## 2021-11-10 ENCOUNTER — Telehealth: Payer: Self-pay | Admitting: Internal Medicine

## 2021-11-10 MED ORDER — HYDROCODONE-ACETAMINOPHEN 10-325 MG PO TABS: 1.0000 | ORAL_TABLET | Freq: Four times a day (QID) | ORAL | 0 refills | Status: DC

## 2021-11-10 NOTE — Telephone Encounter (Signed)
Needs refill on Norco 10/325 for chronic pain.Sent in refill as requested. Has appt in July here and will need urine drug screen at that time. MJB, MD

## 2021-11-10 NOTE — Telephone Encounter (Signed)
Roberto Reid [phone]  Roberto needs refill on below medication, he ran out yesterday.  HYDROcodone-acetaminophen [facility]) 10-325 MG tablet  [facility] [id number] [place], [place] - [address] Phone:  [phone]  Fax:  [phone]

## 2021-12-03 ENCOUNTER — Other Ambulatory Visit: Payer: Self-pay | Admitting: Internal Medicine

## 2021-12-03 DIAGNOSIS — I824Y1 Acute embolism and thrombosis of unspecified deep veins of right proximal lower extremity: Secondary | ICD-10-CM

## 2021-12-03 DIAGNOSIS — I2782 Chronic pulmonary embolism: Secondary | ICD-10-CM

## 2021-12-03 DIAGNOSIS — N189 Chronic kidney disease, unspecified: Secondary | ICD-10-CM

## 2021-12-11 ENCOUNTER — Other Ambulatory Visit: Payer: Self-pay

## 2021-12-11 MED ORDER — HYDROCODONE-ACETAMINOPHEN 10-325 MG PO TABS: 1.0000 | ORAL_TABLET | Freq: Four times a day (QID) | ORAL | 0 refills | Status: DC

## 2021-12-12 ENCOUNTER — Other Ambulatory Visit: Payer: Self-pay | Admitting: Internal Medicine

## 2021-12-15 ENCOUNTER — Ambulatory Visit (INDEPENDENT_AMBULATORY_CARE_PROVIDER_SITE_OTHER): Payer: Medicare Other | Admitting: Internal Medicine

## 2021-12-15 ENCOUNTER — Encounter: Payer: Self-pay | Admitting: Internal Medicine

## 2021-12-15 VITALS — BP 108/60 | HR 84 | Temp 97.5°F | Wt 174.5 lb

## 2021-12-15 DIAGNOSIS — L84 Corns and callosities: Secondary | ICD-10-CM | POA: Diagnosis not present

## 2021-12-15 DIAGNOSIS — N1831 Chronic kidney disease, stage 3a: Secondary | ICD-10-CM | POA: Diagnosis not present

## 2021-12-15 DIAGNOSIS — R52 Pain, unspecified: Secondary | ICD-10-CM

## 2021-12-15 DIAGNOSIS — I10 Essential (primary) hypertension: Secondary | ICD-10-CM

## 2021-12-15 DIAGNOSIS — G8929 Other chronic pain: Secondary | ICD-10-CM | POA: Diagnosis not present

## 2021-12-15 DIAGNOSIS — Z7901 Long term (current) use of anticoagulants: Secondary | ICD-10-CM

## 2021-12-15 DIAGNOSIS — M21961 Unspecified acquired deformity of right lower leg: Secondary | ICD-10-CM

## 2021-12-15 MED ORDER — HYDROCODONE-ACETAMINOPHEN 10-325 MG PO TABS: 1.0000 | ORAL_TABLET | Freq: Four times a day (QID) | ORAL | 0 refills | Status: DC

## 2021-12-15 NOTE — Progress Notes (Signed)
   Subjective:    Patient ID: Roberto Reid, male    DOB: April 30, 1933, 88 y.o.   MRN: [id number]  HPI Has developed painful callus bottom of left foot that has become painful. May have been there 3-4 months. It is draining centrally. Does not want to go to Podiatrist.  Does not want to have foot x-ray.  Is wearing a pad on the foot.  Having difficulty getting Hydrocodone from his pharamacy but has located another pharmacy that has it.  He is on chronic pain management for chronic right foot deformity.  Some days are better than others but generally hydrocodone APAP helps with his pain management.  He does not want anything any stronger.  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  Wife is now working with [employer] from home.  Urine drug screen obtained today as part of chronic pain management efforts.  He does not use illicit drugs.  He takes his medications responsibly and reliably.    Review of Systems no issues with constipation     Objective:   Physical Exam Blood pressure 104/60, pulse 84, temperature 97.5 degrees,pulse oximetry 96%, weight 174 pounds 8 ounces BMI 25.04.  He has lost 8 pounds since April. Skin: Warm and dry.  Chest clear to auscultation.  No carotid bruits.  Cardiac exam: Regular rate and rhythm without ectopy.  No pitting edema of the lower extremities.  He has a callus bottom of the left foot that is 3 cm in diameter and has a central area that is opening up.  He says it is opening up due to wearing a medicated pad.  I think he probably needs to be referred to podiatry or orthopedics and he declines at this time.      Assessment & Plan:   Chronic pain management-doing well with hydrocodone APAP for chronic right foot deformity.  Medication refill today.  Urine drug screen obtained today.  Follow-up in 3 months.  Callus bottom of left foot which is somewhat concerning.  It could become infected.  It is draining very small amount and there is a  risk of osteomyelitis.  He does not want to pursue treatment at this time.  Plan: Urine drug screen obtained.  Refill hydrocodone APAP as previously prescribed and he has physical exam appointment scheduled here along with Medicare wellness visit October 2023.  He has no plans to travel in the interim.

## 2021-12-15 NOTE — Patient Instructions (Signed)
Continue chronic pain management with hydrocodone APAP.  This was refilled.  I am concerned about callus on the bottom of the left foot.  It is draining.  He does not want to see orthopedist or podiatrist.  He will let me know if situation worsens.  Otherwise he will return in October for Medicare wellness and health maintenance exam with fasting labs.  Urine drug screen obtained today since he is on chronic pain management.

## 2021-12-18 LAB — DRUG MONITORING, PANEL 8 WITH CONFIRMATION, URINE
6 Acetylmorphine: NEGATIVE ng/mL (ref <10)
Alcohol Metabolites: POSITIVE ng/mL — AB (ref <500)
Amphetamines: NEGATIVE ng/mL (ref <500)
Benzodiazepines: NEGATIVE ng/mL (ref <100)
Buprenorphine, Urine: NEGATIVE ng/mL (ref <5)
Cocaine Metabolite: NEGATIVE ng/mL (ref <150)
Codeine: NEGATIVE ng/mL (ref <50)
Creatinine: 173.2 mg/dL (ref >=20.0)
Ethyl Glucuronide (ETG): >10000 ng/mL — ABNORMAL HIGH (ref <500)
Ethyl Sulfate (ETS): 7754 ng/mL — ABNORMAL HIGH (ref <100)
Hydrocodone: 5079 ng/mL — ABNORMAL HIGH (ref <50)
Hydromorphone: 1566 ng/mL — ABNORMAL HIGH (ref <50)
MDMA: NEGATIVE ng/mL (ref <500)
Marijuana Metabolite: NEGATIVE ng/mL (ref <20)
Morphine: NEGATIVE ng/mL (ref <50)
Norhydrocodone: 3707 ng/mL — ABNORMAL HIGH (ref <50)
Opiates: POSITIVE ng/mL — AB (ref <100)
Oxidant: NEGATIVE ug/mL (ref <200)
Oxycodone: NEGATIVE ng/mL (ref <100)
pH: 5.1 (ref 4.5–9.0)

## 2021-12-18 LAB — DM TEMPLATE

## 2022-01-14 ENCOUNTER — Telehealth: Payer: Self-pay | Admitting: Internal Medicine

## 2022-01-14 MED ORDER — HYDROCODONE-ACETAMINOPHEN 10-325 MG PO TABS: 1.0000 | ORAL_TABLET | Freq: Four times a day (QID) | ORAL | 0 refills | Status: AC

## 2022-01-14 MED ORDER — HYDROCODONE-ACETAMINOPHEN 10-325 MG PO TABS: 1.0000 | ORAL_TABLET | Freq: Three times a day (TID) | ORAL | 0 refills | Status: DC | PRN

## 2022-01-14 NOTE — Telephone Encounter (Signed)
Patient called for refill on Hydrocodone APAP. Sent in #124 tab Norco 10/325 which he takes every 6 hours as needed for chronic pain. He is seen regularly here for follow up. MJB, MD

## 2022-01-14 NOTE — Telephone Encounter (Signed)
Patient called for refill on Hydrocodone APAP 10/325 1 tablet every 6 hours as needed # 124 tablets.  This was last refilled on July 17.  Patient was last seen here on July 17 as well.  Urine drug screen was obtained at that time.  He takes his medication reliably and responsibly.  He is seen on a regular basis for follow-up.  Refill hydrocodone APAP 10/325 (#124 tabs) with directions 1 tablet every 6 hours as needed for chronic pain.  Initial prescription that I sent a letter chronically today stated every 8 hours but pharmacist call me and I have corrected this to every 6 hours.MJB, MD

## 2022-01-20 ENCOUNTER — Ambulatory Visit (INDEPENDENT_AMBULATORY_CARE_PROVIDER_SITE_OTHER): Payer: Medicare Other | Admitting: Internal Medicine

## 2022-01-20 ENCOUNTER — Telehealth: Payer: Self-pay | Admitting: Internal Medicine

## 2022-01-20 VITALS — Temp 98.4°F | Ht 70.0 in | Wt 175.0 lb

## 2022-01-20 DIAGNOSIS — M21961 Unspecified acquired deformity of right lower leg: Secondary | ICD-10-CM

## 2022-01-20 DIAGNOSIS — S8992XA Unspecified injury of left lower leg, initial encounter: Secondary | ICD-10-CM

## 2022-01-20 DIAGNOSIS — Z7901 Long term (current) use of anticoagulants: Secondary | ICD-10-CM

## 2022-01-20 DIAGNOSIS — W19XXXA Unspecified fall, initial encounter: Secondary | ICD-10-CM

## 2022-01-20 DIAGNOSIS — S81802A Unspecified open wound, left lower leg, initial encounter: Secondary | ICD-10-CM

## 2022-01-20 DIAGNOSIS — Z23 Encounter for immunization: Secondary | ICD-10-CM | POA: Diagnosis not present

## 2022-01-20 DIAGNOSIS — N1831 Chronic kidney disease, stage 3a: Secondary | ICD-10-CM

## 2022-01-20 DIAGNOSIS — I10 Essential (primary) hypertension: Secondary | ICD-10-CM

## 2022-01-20 MED ORDER — MUPIROCIN CALCIUM 2 % EX CREA: TOPICAL_CREAM | Freq: Once | CUTANEOUS | Status: DC

## 2022-01-20 MED ORDER — DOXYCYCLINE HYCLATE 100 MG PO TABS: 100.0000 mg | ORAL_TABLET | Freq: Two times a day (BID) | ORAL | 0 refills | Status: DC

## 2022-01-20 NOTE — Progress Notes (Signed)
   Subjective:    Patient ID: Roberto Reid, male    DOB: [birth year], 88 y.o.   MRN: [id number]  HPI 88 year old man who tripped near a curb in the [place] area where he resides and fell suffering a left lower leg flap abrasion just below his knee.  He has been taking care of it quite well and is shows no signs of secondary bacterial infection at this time.  He was given a tetanus update today.  He has had no systemic symptoms such as fever or shaking chills suggesting septicemia from this injury and possible infection.  He has chronic musculoskeletal pain with a deformity of his right foot for which he takes hydrocodone APAP through this office.  He has regular urine drug screens.  He does not use illicit drugs.  He takes his medications responsibly and reliably.  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  Medical issues include essential hypertension, controlled type 2 diabetes mellitus.  He had left knee arthroplasty in 2013.  Had left knee surgery done by Dr. Onnie Graham for torn cartilage November 2005.  History of right quadriceps tendon rupture due to a fall down some marble steps in [place] in 2016.  He had a right knee torn medial meniscus at that time and had complex repair by Dr. Lorre Nick in 2016.  History of hyperlipidemia treated with Lipitor  GE reflux treated with Nexium  History of chronic insomnia.   Review of Systems see above     Objective:   Physical Exam He is afebrile.  Vital signs reviewed.  Left anterior leg has a flap injury just below the patella.  It does not appear to be secondarily infected.  However it is approximately 3-1/2 cm in diameter across his anterior leg.  This wound was cleaned sterilely today.  Bactroban ointment was placed on it after cleaning it with peroxide.  Tetanus immunization update given.       Assessment & Plan:  Flap wound left anterior lower leg secondary to a fall  Plan: Tetanus immunization  update given.  He will take care off of it with cleaning with peroxide and applying Bactroban ointment twice daily until healed.  He was placed on doxycycline 100 mg twice daily for 10 days for possible secondary bacterial infection.  He will call me if this is not healing well.  Time spent assessing wound, giving tetanus update, reviewing medical records and dressing wound in sterile fashion is 25 minutes.

## 2022-01-20 NOTE — Telephone Encounter (Signed)
scheduled

## 2022-01-20 NOTE — Telephone Encounter (Signed)
Roberto Reid [phone]  [name] called to say he fell on 01/14/2022 and took a chunk out of his left knee and he had some Levaquin so he has been taking it to prevent infection, however it makes him very nauseas and he would like something else. I let him know he would need to come in for you to see the wound.

## 2022-01-24 ENCOUNTER — Encounter: Payer: Self-pay | Admitting: Internal Medicine

## 2022-01-24 NOTE — Patient Instructions (Signed)
Tetanus update given.  He will take doxycycline 100 mg twice daily for 10 days.  He will apply Bactroban ointment after cleaning wound with peroxide twice daily.  He will call if not getting along well over the next couple of days.  He is on chronic pain management and has narcotics on hand which he takes regularly and responsibly.

## 2022-01-27 ENCOUNTER — Other Ambulatory Visit: Payer: Self-pay

## 2022-01-27 ENCOUNTER — Other Ambulatory Visit: Payer: Self-pay | Admitting: Urology

## 2022-01-27 DIAGNOSIS — E119 Type 2 diabetes mellitus without complications: Secondary | ICD-10-CM

## 2022-01-27 DIAGNOSIS — N529 Male erectile dysfunction, unspecified: Secondary | ICD-10-CM

## 2022-01-27 DIAGNOSIS — E7439 Other disorders of intestinal carbohydrate absorption: Secondary | ICD-10-CM

## 2022-01-27 DIAGNOSIS — I824Y1 Acute embolism and thrombosis of unspecified deep veins of right proximal lower extremity: Secondary | ICD-10-CM

## 2022-01-27 DIAGNOSIS — N1831 Chronic kidney disease, stage 3a: Secondary | ICD-10-CM

## 2022-01-27 DIAGNOSIS — I10 Essential (primary) hypertension: Secondary | ICD-10-CM

## 2022-01-29 ENCOUNTER — Other Ambulatory Visit: Payer: Self-pay | Admitting: Internal Medicine

## 2022-02-17 ENCOUNTER — Telehealth: Payer: Self-pay | Admitting: Internal Medicine

## 2022-02-17 DIAGNOSIS — R52 Pain, unspecified: Secondary | ICD-10-CM

## 2022-02-17 DIAGNOSIS — G8929 Other chronic pain: Secondary | ICD-10-CM

## 2022-02-17 MED ORDER — HYDROCODONE-ACETAMINOPHEN 10-325 MG PO TABS: ORAL_TABLET | ORAL | 0 refills | Status: DC

## 2022-02-17 NOTE — Telephone Encounter (Signed)
Reshawn Ostlund  [phone]  [name] called to say he was out of his below prescription and needed a refill. For some reason it was in the discontinued medications.   HYDROcodone-acetaminophen [facility]) 10-325 MG tablet   [facility] [id number] [place], [place] - [place] Phone: [phone]  Fax: [phone]

## 2022-02-17 NOTE — Telephone Encounter (Signed)
Refill Norco 10/325, Patient takes med responsibly. MJB, MD

## 2022-03-03 ENCOUNTER — Other Ambulatory Visit: Payer: Medicare Other

## 2022-03-03 DIAGNOSIS — N529 Male erectile dysfunction, unspecified: Secondary | ICD-10-CM

## 2022-03-03 DIAGNOSIS — E119 Type 2 diabetes mellitus without complications: Secondary | ICD-10-CM

## 2022-03-03 DIAGNOSIS — Z125 Encounter for screening for malignant neoplasm of prostate: Secondary | ICD-10-CM | POA: Diagnosis not present

## 2022-03-03 DIAGNOSIS — E7849 Other hyperlipidemia: Secondary | ICD-10-CM

## 2022-03-03 DIAGNOSIS — I10 Essential (primary) hypertension: Secondary | ICD-10-CM

## 2022-03-03 DIAGNOSIS — Z86711 Personal history of pulmonary embolism: Secondary | ICD-10-CM

## 2022-03-03 DIAGNOSIS — N1831 Chronic kidney disease, stage 3a: Secondary | ICD-10-CM

## 2022-03-04 ENCOUNTER — Ambulatory Visit (INDEPENDENT_AMBULATORY_CARE_PROVIDER_SITE_OTHER): Payer: Medicare Other | Admitting: Internal Medicine

## 2022-03-04 ENCOUNTER — Encounter: Payer: Self-pay | Admitting: Internal Medicine

## 2022-03-04 VITALS — BP 124/72 | HR 79 | Temp 98.0°F | Ht 66.0 in | Wt 173.0 lb

## 2022-03-04 DIAGNOSIS — Z86711 Personal history of pulmonary embolism: Secondary | ICD-10-CM

## 2022-03-04 DIAGNOSIS — R634 Abnormal weight loss: Secondary | ICD-10-CM

## 2022-03-04 DIAGNOSIS — M21961 Unspecified acquired deformity of right lower leg: Secondary | ICD-10-CM | POA: Diagnosis not present

## 2022-03-04 DIAGNOSIS — Z7901 Long term (current) use of anticoagulants: Secondary | ICD-10-CM

## 2022-03-04 DIAGNOSIS — Z23 Encounter for immunization: Secondary | ICD-10-CM

## 2022-03-04 DIAGNOSIS — F5104 Psychophysiologic insomnia: Secondary | ICD-10-CM

## 2022-03-04 DIAGNOSIS — Z87442 Personal history of urinary calculi: Secondary | ICD-10-CM

## 2022-03-04 DIAGNOSIS — M79671 Pain in right foot: Secondary | ICD-10-CM | POA: Diagnosis not present

## 2022-03-04 DIAGNOSIS — I10 Essential (primary) hypertension: Secondary | ICD-10-CM

## 2022-03-04 DIAGNOSIS — E119 Type 2 diabetes mellitus without complications: Secondary | ICD-10-CM

## 2022-03-04 DIAGNOSIS — Z0000 Encounter for general adult medical examination without abnormal findings: Secondary | ICD-10-CM

## 2022-03-04 DIAGNOSIS — N1831 Chronic kidney disease, stage 3a: Secondary | ICD-10-CM

## 2022-03-04 DIAGNOSIS — G8929 Other chronic pain: Secondary | ICD-10-CM

## 2022-03-04 DIAGNOSIS — R52 Pain, unspecified: Secondary | ICD-10-CM

## 2022-03-04 LAB — CBC WITH DIFFERENTIAL/PLATELET
Absolute Monocytes: 201 cells/uL (ref 200–950)
Basophils Absolute: 30 cells/uL (ref 0–200)
Basophils Relative: 0.8 %
Eosinophils Absolute: 103 cells/uL (ref 15–500)
Eosinophils Relative: 2.7 %
HCT: 39.1 % (ref 38.5–50.0)
Hemoglobin: 13.7 g/dL (ref 13.2–17.1)
Lymphs Abs: 1011 cells/uL (ref 850–3900)
MCH: 34.4 pg — ABNORMAL HIGH (ref 27.0–33.0)
MCHC: 35 g/dL (ref 32.0–36.0)
MCV: 98.2 fL (ref 80.0–100.0)
MPV: 11.2 fL (ref 7.5–12.5)
Monocytes Relative: 5.3 %
Neutro Abs: 2455 cells/uL (ref 1500–7800)
Neutrophils Relative %: 64.6 %
Platelets: 165 10*3/uL (ref 140–400)
RBC: 3.98 10*6/uL — ABNORMAL LOW (ref 4.20–5.80)
RDW: 12.4 % (ref 11.0–15.0)
Total Lymphocyte: 26.6 %
WBC: 3.8 10*3/uL (ref 3.8–10.8)

## 2022-03-04 LAB — POCT URINALYSIS DIPSTICK
Bilirubin, UA: NEGATIVE
Blood, UA: NEGATIVE
Glucose, UA: NEGATIVE
Ketones, UA: NEGATIVE
Leukocytes, UA: NEGATIVE
Nitrite, UA: NEGATIVE
Protein, UA: NEGATIVE
Spec Grav, UA: 1.015 (ref 1.010–1.025)
Urobilinogen, UA: 0.2 E.U./dL
pH, UA: 5 (ref 5.0–8.0)

## 2022-03-04 LAB — COMPLETE METABOLIC PANEL WITH GFR
AG Ratio: 1.8 (calc) (ref 1.0–2.5)
ALT: 5 U/L — ABNORMAL LOW (ref 9–46)
AST: 9 U/L — ABNORMAL LOW (ref 10–35)
Albumin: 4.2 g/dL (ref 3.6–5.1)
Alkaline phosphatase (APISO): 32 U/L — ABNORMAL LOW (ref 35–144)
BUN/Creatinine Ratio: 15 (calc) (ref 6–22)
BUN: 28 mg/dL — ABNORMAL HIGH (ref 7–25)
CO2: 28 mmol/L (ref 20–32)
Calcium: 9.1 mg/dL (ref 8.6–10.3)
Chloride: 102 mmol/L (ref 98–110)
Creat: 1.89 mg/dL — ABNORMAL HIGH (ref 0.70–1.22)
Globulin: 2.4 g/dL (calc) (ref 1.9–3.7)
Glucose, Bld: 107 mg/dL — ABNORMAL HIGH (ref 65–99)
Potassium: 4.4 mmol/L (ref 3.5–5.3)
Sodium: 139 mmol/L (ref 135–146)
Total Bilirubin: 1.7 mg/dL — ABNORMAL HIGH (ref 0.2–1.2)
Total Protein: 6.6 g/dL (ref 6.1–8.1)
eGFR: 34 mL/min/{1.73_m2} — ABNORMAL LOW (ref >=60)

## 2022-03-04 LAB — PSA: PSA: 0.42 ng/mL (ref <=4.00)

## 2022-03-04 LAB — HEMOGLOBIN A1C
Hgb A1c MFr Bld: 5.3 % of total Hgb (ref <5.7)
Mean Plasma Glucose: 105 mg/dL
eAG (mmol/L): 5.8 mmol/L

## 2022-03-04 LAB — LIPID PANEL
Cholesterol: 140 mg/dL (ref <200)
HDL: 46 mg/dL (ref >=40)
LDL Cholesterol (Calc): 78 mg/dL (calc)
Non-HDL Cholesterol (Calc): 94 mg/dL (calc) (ref <130)
Total CHOL/HDL Ratio: 3 (calc) (ref <5.0)
Triglycerides: 75 mg/dL (ref <150)

## 2022-03-04 LAB — MICROALBUMIN, URINE: Microalb, Ur: 0.2 mg/dL

## 2022-03-04 NOTE — Progress Notes (Signed)
Subjective:    Patient ID: Roberto Reid, male    DOB: 1932-06-30, 88 y.o.   MRN: [id number]  HPI 88 year old Male seen for Medicare wellness and health maintenance exam  His weights are weights reviewed.  He weighed 182 pounds in April.  He weighed 184 pounds in October 2022.  Seems to have lost about 9 pounds in the past 6 months.  He was concerned that he may have lost some weight. Does not get as hungry as he used to.  He is in the process of trying to sell his home in [place] and move to [place].  His wife has taken a job working from home with an Insurance underwriter.   She plays golf some days with her friends and then has to work in the afternoon.  He is still doing some consulting with laboratory businesses.  He plays some golf but they do not play together.  He has a light breakfast and light lunch.  His son has visited recently from out of town.  Patient does not have as much energy as he used to.  He is on chronic pain medication and has been on chronic pain medication for a number of years for a foot deformity.  He is followed here every 3 months and takes his medications responsibly and reliably.  We discussed whether or not he could be mildly depressed and perhaps that is the case.  We will continue to monitor his symptoms.  He has mild hypertension treated with amlodipine and losartan.  He does have some Klonopin to take for anxiety if needed.  He takes Seroquel for sleep as well as Ambien.  Despite his age he seems to tolerate all of these medicines quite well.  He realizes he is not quite as busy as he used to be and he may be a bit lonely is with his wife now working part-time.  He is on chronic anticoagulation with history of DVT.  He takes hydrocodone APAP 10/325 every 6-8 hours as needed for chronic pain.  He has a history of mild glucose intolerance.  History of GE reflux, hyperlipidemia, chronic insomnia, low testosterone.  History of Barrett's esophagus and adenomatous colon  polyp diagnosed in 2008.  Right kidney stone in 1997 and in 2007.  History of uric acid stones 2007 treated by Dr. Jeffie Pollock.  History of hearing loss.  Cardiolite study in 2008 by Dr. Lia Foyer was negative.  He had left lower lobe pneumonia in 2017, was hospitalized and was found to have pulmonary embolus treated with Eliquis.  Hematologist advised him to continue with anticoagulation indefinitely.  Had left knee arthroplasty in 2013.  Review of Systems see above-denies nausea, vomiting abdominal pain     Objective:   Physical Exam Vital signs reviewed.  He weighs 173 pounds and BMI is 27.92 Skin: Warm and dry.  No cervical adenopathy.  Chest clear.  Cardiac exam: Regular rate and rhythm without ectopy.  Abdomen is soft nondistended without hepatosplenomegaly masses or tenderness.  Rectal exam is normal.  No lower extremity pitting edema.  Brief neurological exam no gross focal deficits.      Assessment & Plan:  9 pound weight loss-likely not eating as much as he did previously.  Not quite as active either.  We will need to monitor this closely.  He will return in 3 months and we will weigh him again.  He has no GI symptoms.  He may be mildly depressed.  Chronic anticoagulation  Chronic pain management-he takes his medications responsibly and reliably  Chronic insomnia  History of anxiety  Hypertension  History of mild impaired glucose tolerance  Chronic foot deformity causing chronic pain-seems to tolerate narcotic pain medication fine with no side effects  Plan: He has some situational stress trying to sell current house and moved to [place].  This may be contributing to decreased appetite but we will need to monitor this closely.  He will return in 3 months or sooner if necessary.

## 2022-03-04 NOTE — Progress Notes (Signed)
     Annual Wellness Visit     Patient: Roberto Reid, Male    DOB: [birth year], 88 y.o.   MRN: [id number] Visit Date: 03/04/2022  Chief Complaint  Patient presents with   Medicare [place] is a 88 y.o. male who presents today for his Annual Wellness Visit.  HPI he also is here for health maintenance exam.   Social History   Social History Narrative   Lives at home with wife   Right handed   Caffeine: 2 cups/day    Patient Care Team: [name], Cresenciano Lick, MD as PCP - General (Internal Medicine) Annia Belt, MD as Consulting Physician (Oncology) Madelon Lips, MD as Consulting Physician (Nephrology)  Review of Systems has lost some weight and may be mildly depressed         Most recent functional status assessment:    03/04/2022   11:20 AM  In your present state of health, do you have any difficulty performing the following activities:  Hearing? 1  Vision? 0  Difficulty concentrating or making decisions? 0  Walking or climbing stairs? 1  Dressing or bathing? 0  Doing errands, shopping? 0  Preparing Food and eating ? N  Using the Toilet? N  In the past six months, have you accidently leaked urine? N  Do you have problems with loss of bowel control? N  Managing your Medications? N  Managing your Finances? N  Housekeeping or managing your Housekeeping? N   Most recent fall risk assessment:    03/04/2022   11:19 AM  Fall Risk   Falls in the past year? 1  Number falls in past yr: 0  Injury with Fall? 1  Risk for fall due to : No Fall Risks  Follow up Falls evaluation completed    Most recent depression screenings:    03/04/2022   11:19 AM 02/27/2022    9:12 AM  PHQ 2/9 Scores  PHQ - 2 Score 0 0   Most recent cognitive screening:    03/04/2022   11:21 AM  6CIT Screen  What Year? 0 points  What month? 0 points  What time? 0 points  Count back from 20 0 points  Months in reverse 0 points  Repeat phrase 0  points  Total Score 0 points           Annual wellness visit done today including the all of the following: Reviewed patient's Family Medical History Reviewed and updated list of patient's medical providers Assessment of cognitive impairment was done Assessed patient's functional ability Established a written schedule for health screening [name] Completed and Reviewed  Discussed health benefits of physical activity, and encouraged him to engage in regular exercise appropriate for his age and condition.         {I, Elby Showers, MD, have reviewed all documentation for this visit. The documentation on 03/14/22 for the exam, diagnosis, procedures, and orders are all accurate and complete.   Kei Mcelhiney Barron Alvine, CMA

## 2022-03-05 ENCOUNTER — Other Ambulatory Visit: Payer: Self-pay | Admitting: Internal Medicine

## 2022-03-05 DIAGNOSIS — N189 Chronic kidney disease, unspecified: Secondary | ICD-10-CM

## 2022-03-05 DIAGNOSIS — I2782 Chronic pulmonary embolism: Secondary | ICD-10-CM

## 2022-03-05 DIAGNOSIS — I824Y1 Acute embolism and thrombosis of unspecified deep veins of right proximal lower extremity: Secondary | ICD-10-CM

## 2022-03-12 ENCOUNTER — Telehealth: Payer: Self-pay | Admitting: Internal Medicine

## 2022-03-12 ENCOUNTER — Other Ambulatory Visit: Payer: Self-pay

## 2022-03-12 MED ORDER — HYDROCODONE-ACETAMINOPHEN 10-325 MG PO TABS: ORAL_TABLET | ORAL | 0 refills | Status: DC

## 2022-03-12 NOTE — Telephone Encounter (Signed)
Refil Norco 10/325 as prescribed. Patient seen here recently. He takes his med responsibly. MJB, MD

## 2022-03-12 NOTE — Telephone Encounter (Signed)
Refill sent to pharmacy.   

## 2022-03-12 NOTE — Telephone Encounter (Signed)
Patient called who is a very reliable historian. Roberto Reid does not have enough med for his Rx to be filled so he wants it called to [facility] instead. Cancel Norco Rx to [facility] and send to [facility]. MJB,. MD

## 2022-03-12 NOTE — Telephone Encounter (Signed)
[name] needs refill on below medication he ran out today.  HYDROcodone-acetaminophen [facility]) 10-325 MG tablet   [facility] [id number] [place], [place] - [place] Phone: [phone]  Fax: [phone]

## 2022-03-12 NOTE — Telephone Encounter (Signed)
Cancel Norco Rx sent to [facility] as they do not have enough tabs for 30 day supply for chronic pain and create new Rx for [facility]. MJB, MD

## 2022-03-14 NOTE — Patient Instructions (Signed)
Continue chronic pain medications as prescribed.  Flu vaccine given.  Try to eat a bit more.  Monitor your weight at least 3 times weekly and let me know if you continue to lose weight.  Otherwise I will see you in 3 months.

## 2022-03-26 ENCOUNTER — Other Ambulatory Visit: Payer: Self-pay | Admitting: Internal Medicine

## 2022-04-06 ENCOUNTER — Ambulatory Visit (INDEPENDENT_AMBULATORY_CARE_PROVIDER_SITE_OTHER): Payer: Medicare Other | Admitting: Internal Medicine

## 2022-04-06 ENCOUNTER — Telehealth: Payer: Self-pay | Admitting: Internal Medicine

## 2022-04-06 ENCOUNTER — Ambulatory Visit
Admission: RE | Admit: 2022-04-06 | Discharge: 2022-04-06 | Disposition: A | Discharge status: Home / Self Care | Payer: Medicare Other | Source: Ambulatory Visit | Attending: Internal Medicine | Admitting: Internal Medicine

## 2022-04-06 ENCOUNTER — Other Ambulatory Visit: Payer: Self-pay | Admitting: Internal Medicine

## 2022-04-06 ENCOUNTER — Encounter: Payer: Self-pay | Admitting: Internal Medicine

## 2022-04-06 VITALS — BP 128/60 | HR 68 | Temp 97.7°F | Ht 66.0 in | Wt 177.4 lb

## 2022-04-06 DIAGNOSIS — R0602 Shortness of breath: Secondary | ICD-10-CM

## 2022-04-06 DIAGNOSIS — N1831 Chronic kidney disease, stage 3a: Secondary | ICD-10-CM

## 2022-04-06 DIAGNOSIS — E7439 Other disorders of intestinal carbohydrate absorption: Secondary | ICD-10-CM

## 2022-04-06 DIAGNOSIS — R001 Bradycardia, unspecified: Secondary | ICD-10-CM | POA: Diagnosis not present

## 2022-04-06 DIAGNOSIS — G8929 Other chronic pain: Secondary | ICD-10-CM

## 2022-04-06 DIAGNOSIS — Z7901 Long term (current) use of anticoagulants: Secondary | ICD-10-CM

## 2022-04-06 DIAGNOSIS — R531 Weakness: Secondary | ICD-10-CM | POA: Diagnosis not present

## 2022-04-06 DIAGNOSIS — Z87442 Personal history of urinary calculi: Secondary | ICD-10-CM

## 2022-04-06 DIAGNOSIS — R52 Pain, unspecified: Secondary | ICD-10-CM

## 2022-04-06 DIAGNOSIS — I10 Essential (primary) hypertension: Secondary | ICD-10-CM

## 2022-04-06 DIAGNOSIS — E119 Type 2 diabetes mellitus without complications: Secondary | ICD-10-CM

## 2022-04-06 DIAGNOSIS — Z86711 Personal history of pulmonary embolism: Secondary | ICD-10-CM

## 2022-04-06 DIAGNOSIS — M21961 Unspecified acquired deformity of right lower leg: Secondary | ICD-10-CM

## 2022-04-06 DIAGNOSIS — F5104 Psychophysiologic insomnia: Secondary | ICD-10-CM

## 2022-04-06 DIAGNOSIS — M79671 Pain in right foot: Secondary | ICD-10-CM

## 2022-04-06 NOTE — Patient Instructions (Signed)
Patient will be seen tomorrow by Dr. Claiborne Billings for evaluation. Then he will come here for labs

## 2022-04-06 NOTE — Progress Notes (Signed)
Subjective:    Patient ID: Roberto Reid, male    DOB: [birth year], 88 y.o.   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 He is not on beta blocker med. He denies chest pain.  EKGs from 2018, 2016, 2013  and 2012 show RBBB.he saw Dr. Lia Foyer in 2012 for cardiac clearance for knee surgery. Echo in 2008 was normal. Also noted to have RBBB in 2008 per Dr. Maren Beach notes. Had nuclear study then with EF 79%.Longstanding hx of HTN.  He is seen today in [place]. However, these episodes were disturbing to him. He was just seen October 4 for medicare wellness visit.  He recently sold his home in [place] in order to downsize and bought another smaller home in [place].  Has started moving and will need to finish this by late November.  His wife recently took employment with an airline.  He is still doing some consulting with a laboratory business.  He tries to exercise but has chronic foot pain and is on chronic pain medication.  Does not have as much energy as he used to.  He takes Seroquel for sleep as well as Klonopin for anxiety if needed.  He does take Ambien.He is on chronic anticoagulation with history of DVT.  He takes hydrocodone APAP 10/325 every 6-8 hours as needed for foot pain.  He does play golf some but not with his wife.  She generally plays with her friends.  He has a history of mild glucose intolerance, GE reflux, hyperlipidemia, chronic insomnia, low testosterone.  History of Barrett's esophagus and adenomatous colon polyp diagnosed in 2008.  History of kidney stone (right) in 1997 and in 2007.  History of hearing loss.  Had left lower lobe pneumonia in 2017 and was hospitalized.  Was found to have pulmonary embolus treated with Eliquis.  Hematologist advised him  to continue anticoagulation indefinitely.  He had a left knee arthroplasty in 2013.  He has a severe bunion deformity of his right foot.  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  He saw Dr. Para March at [facility] and had a procedure that initially seemed to help but did not give him long-term pain relief.  He saw Dr. Ouida Sills in [place] who did not think it was wise to operate on his foot.  He had a fractured ankle in 1953 secondary to a football injury.  Fractured ribs in the remote past.  History of recurrent cellulitis of his right leg related to an apparent spider bite in 2008 but likely due to venous insufficiency and minor trauma causing cellulitis.  He has had recurrent flareups from time to time requiring antibiotic treatment.  He has a history of essential hypertension, controlled type 2 diabetes mellitus, low testosterone, erectile dysfunction, GE reflux treated with Nexium, hyperlipidemia treated with Lipitor.  Issues with chronic insomnia treated with Ambien and Seroquel.  He had left lower lobe pneumonia in 2017 and developed shortness of breath.  He was hospitalized and found to have pulmonary embolism which was treated with Eliquis.  Hematologist has advised him to continue with anticoagulation indefinitely.  History of hearing loss.  History of dyshidrotic hand eczema.  History of allergy testing done in  2010 positive to cats and some molds.  Minimal reactivity to dog.  Spirometry was normal.  Cataract extraction both eyes November 2010.  Left knee surgery done by Dr. Onnie Graham for torn cartilage November 2005.  Hospitalized in [place] September 2011 with right leg cellulitis treated with IV antibiotics.  Hypertension has been well controlled with losartan.  History of elevated serum creatinine consistent with chronic kidney disease but has declined nephrology consultation.  He had ultrasound of the  kidneys in 2017.  There was suspicion for mass in the interpolar region of the right kidney.  He had an MRI of the right kidney showing small bilateral renal cyst and no worrisome renal lesions.  Reasons for chronic kidney disease include hypertension and diabetes.  In the spring 2016 while on a trip to [place] he suffered a fall down some marble steps in [place].  He also had right knee torn medial meniscus and had complex repair by Dr. Lorre Nick in 2016.  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  He is married.  This is his second marriage.  No children from current marriage.  Currently a non-smoker.  Social alcohol consumption.  He has a Scientist, water quality.  He is a former Engineer, structural.  Family history: Father died at age 86 with Parkinson's disease.  Father had history of diabetes.  Mother died at age 50 of unknown causes.  2 sisters.  A son and a daughter in good health.  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  EKG today shows ST depression in lateral leads which was present on a her EKGs.  He also has a right bundle branch block which is not new either.  Review of Systems see above he denies chest pain.  He has felt well today with no symptoms whatsoever.  He has not had any syncope or headache.     Objective:   Physical Exam  Vital signs reviewed.  He is alert and oriented.  Neck is supple.  No carotid bruits.  Chest clear.  Cardiac exam regular rate and rhythm.  No murmur appreciated.  No lower extremity pitting edema.  Affect thought and judgment appear to be normal.      Assessment & Plan:  I am concerned with his recent episodes of bradycardia over a few days last week.?  Sick sinus syndrome.  He had no loss of  consciousness.  He did have shortness of breath associated with these episodes.  Plan: He has been have a chest x-ray today.  He will have lab work drawn tomorrow as phlebotomist has left for the day.  I would like for Cardiology to see him as soon as possible.  He will continue to monitor his symptoms and let me know if there is any change.  Labs will be drawn tomorrow.  He was sent today for chest x-ray at [facility].

## 2022-04-06 NOTE — Telephone Encounter (Signed)
Roberto Reid [phone]  Roberto called to say he had some spells over the weekend of shortness of breath and his heart rate dropping down to 51 and 42, wanted to come in and see you today. I went ahead and scheduled him for 12:00.

## 2022-04-06 NOTE — Telephone Encounter (Signed)
Roberto Reid [phone]  [name] called to say he is out of below medication today and needs a refill.  HYDROcodone-acetaminophen [facility]) 10-325 MG tablet   [facility] [id number] [place], [place] - [place] Phone: [phone]  Fax: [phone]

## 2022-04-07 ENCOUNTER — Ambulatory Visit: Payer: Medicare Other | Attending: Cardiovascular Disease

## 2022-04-07 ENCOUNTER — Encounter: Payer: Self-pay | Admitting: Cardiovascular Disease

## 2022-04-07 ENCOUNTER — Ambulatory Visit: Payer: Medicare Other | Attending: Cardiovascular Disease | Admitting: Cardiovascular Disease

## 2022-04-07 ENCOUNTER — Telehealth: Payer: Self-pay | Admitting: Internal Medicine

## 2022-04-07 ENCOUNTER — Encounter: Payer: Self-pay | Admitting: Internal Medicine

## 2022-04-07 ENCOUNTER — Other Ambulatory Visit: Payer: Medicare Other

## 2022-04-07 VITALS — BP 138/71 | HR 78 | Ht 66.0 in | Wt 175.0 lb

## 2022-04-07 DIAGNOSIS — R011 Cardiac murmur, unspecified: Secondary | ICD-10-CM

## 2022-04-07 DIAGNOSIS — Z7901 Long term (current) use of anticoagulants: Secondary | ICD-10-CM

## 2022-04-07 DIAGNOSIS — R531 Weakness: Secondary | ICD-10-CM

## 2022-04-07 DIAGNOSIS — R0602 Shortness of breath: Secondary | ICD-10-CM | POA: Diagnosis not present

## 2022-04-07 DIAGNOSIS — R001 Bradycardia, unspecified: Secondary | ICD-10-CM

## 2022-04-07 DIAGNOSIS — Z86711 Personal history of pulmonary embolism: Secondary | ICD-10-CM

## 2022-04-07 DIAGNOSIS — N184 Chronic kidney disease, stage 4 (severe): Secondary | ICD-10-CM

## 2022-04-07 MED ORDER — HYDROCODONE-ACETAMINOPHEN 10-325 MG PO TABS: ORAL_TABLET | ORAL | 0 refills | Status: DC

## 2022-04-07 NOTE — Telephone Encounter (Signed)
Send in [place] to [facility] as [facility] does not have med.

## 2022-04-07 NOTE — Patient Instructions (Signed)
Medication Instructions:  The current medical regimen is effective;  continue present plan and medications.  *If you need a refill on your cardiac medications before your next appointment, please call your pharmacy*   Testing/Procedures:  Echocardiogram - Your physician has requested that you have an echocardiogram. Echocardiography is a painless test that uses sound waves to create images of your heart. It provides your doctor with information about the size and shape of your heart and how well your heart's chambers and valves are working. This procedure takes approximately one hour. There are no restrictions for this procedure.   ZIO XT- Long Term Monitor Instructions  Your physician has requested you wear a ZIO patch monitor for 14 days.  This is a single patch monitor. [employer] supplies one patch monitor per enrollment. Additional stickers are not available. Please do not apply patch if you will be having a Nuclear Stress Test,  Echocardiogram, Cardiac CT, MRI, or Chest Xray during the period you would be wearing the  monitor. The patch cannot be worn during these tests. You cannot remove and re-apply the  ZIO XT patch monitor.  Your ZIO patch monitor will be mailed 3 day USPS to your address on file. It may take 3-5 days  to receive your monitor after you have been enrolled.  Once you have received your monitor, please review the enclosed instructions. Your monitor  has already been registered assigning a specific monitor serial # to you.  Billing and Patient Assistance Program Information  We have supplied [employer] with any of your insurance information on file for billing purposes. [employer] offers a sliding scale Patient Assistance Program for patients that do not have  insurance, or whose insurance does not completely cover the cost of the ZIO monitor.  You must apply for the Patient Assistance Program to qualify for this discounted rate.  To apply, please call [employer] at  [phone], select option 4, select option 2, ask to apply for  Patient Assistance Program. Theodore Demark will ask your household income, and how many people  are in your household. They will quote your out-of-pocket cost based on that information.  [employer] will also be able to set up a 105-month interest-free payment plan if needed.  Applying the monitor   Shave hair from upper left chest.  Hold abrader disc by orange tab. Rub abrader in 40 strokes over the upper left chest as  indicated in your monitor instructions.  Clean area with 4 enclosed alcohol pads. Let dry.  Apply patch as indicated in monitor instructions. Patch will be placed under collarbone on left  side of chest with arrow pointing upward.  Rub patch adhesive wings for 2 minutes. Remove white label marked "1". Remove the white  label marked "2". Rub patch adhesive wings for 2 additional minutes.  While looking in a mirror, press and release button in center of patch. A small green light will  flash 3-4 times. This will be your only indicator that the monitor has been turned on.  Do not shower for the first 24 hours. You may shower after the first 24 hours.  Press the button if you feel a symptom. You will hear a small click. Record Date, Time and  Symptom in the Patient Logbook.  When you are ready to remove the patch, follow instructions on the last 2 pages of Patient  Logbook. Stick patch monitor onto the last page of Patient Logbook.  Place Patient Logbook in the blue and white box. Use locking tab  on box and tape box closed  securely. The blue and white box has prepaid postage on it. Please place it in the mailbox as  soon as possible. Your physician should have your test results approximately 7 days after the  monitor has been mailed back to [facility].  Call [facility] at [phone] if you have questions regarding  your ZIO XT patch monitor. Call them immediately if you see an orange light  blinking on your  monitor.  If your monitor falls off in less than 4 days, contact our Monitor department at [phone].  If your monitor becomes loose or falls off after 4 days call [employer] at [phone] for  suggestions on securing your monitor    Follow-Up: At [facility], you and your health needs are our priority.  As part of our continuing mission to provide you with exceptional heart care, we have created designated Provider Care Teams.  These Care Teams include your primary Cardiologist (physician) and Advanced Practice Providers (APPs -  Physician Assistants and Nurse Practitioners) who all work together to provide you with the care you need, when you need it.  We recommend signing up for the patient portal called "MyChart".  Sign up information is provided on this After Visit Summary.  MyChart is used to connect with patients for Virtual Visits (Telemedicine).  Patients are able to view lab/test results, encounter notes, upcoming appointments, etc.  Non-urgent messages can be sent to your provider as well.   To learn more about what you can do with MyChart, go to [email or web address].    Your next appointment:   6 week(s)  The format for your next appointment:   In Person  Provider:   Shelva Majestic, MD

## 2022-04-07 NOTE — Telephone Encounter (Signed)
Roberto Reid is out of this medication can you send to   [place], [address] AT [facility] [address] Phone: [phone]  Fax: [phone]

## 2022-04-07 NOTE — Progress Notes (Unsigned)
Enrolled for [employer] to mail a ZIO XT long term holter monitor to the patients address on file.  

## 2022-04-07 NOTE — Progress Notes (Signed)
Cardiology Office Note    Date:  04/14/2022   ID:  Roberto Reid, DOB 1932/06/23, MRN [id number]  PCP:  Elby Showers, MD  Cardiologist:  Shelva Majestic, MD   Chief Complaint  Patient presents with   Establish Care    New cardiology evaluation referred by Dr. Renold Genta due to concerns regarding bradycardia and some shortness of breath.  History of Present Illness:  Roberto Reid is a 88 y.o. male who is a longstanding patient of Dr. Cresenciano Lick. [name].  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  He has a history of right bundle branch block and remotely had seen Dr. Lia Foyer in 2008 had a nuclear study which apparently showed normal perfusion.  He admits to a longstanding history of hypertension.  He tries to stay active and often rides a recumbent bike, he often plays golf approximately 3 days/week.  He denies any associated lightheadedness or dizziness.  He had recently seen Dr. Renold Genta and she was concerned regarding reduced heart rate which she had experienced over the weekend with heart rate decreasing to 51 and on another occasion down to 42 bpm.  He was not on beta-blocker therapy.  He denied associated chest pain, presyncope or syncope.  He had undergone laboratory on March 03, 2022 which showed a hemoglobin of 13.7 hematocrit of 39.1 with normal platelet count at 1 65,000.  Lipid studies reveal total cholesterol 140 HDL 46 triglycerides 75 and LDL 78.  Glucose was 107.  He had CKD with creatinine 1.89 consistent with stage IIIb CKD with GFR estimated at 34.  Hemoglobin A1c was 5.3.  Urinalysis was negative.  PSA was 0.52.  He admits that there has been some increased stress with the [place] and [place]  His wife recently has started working with an Insurance underwriter.  He denies any exertionally precipitated chest  tightness or change in exercise tolerance.  He has history of mild glucose intolerance, GE reflux, hyperlipidemia, chronic insomnia, as well as Barrett's esophagus and adenomatous colonic polyp.  There is remote history of kidney stone.  He had suffered a lower lobe pneumonia in 2017 and has a history of hearing loss.  He had been followed by Dr. Beryle Beams following a diagnosis of bilateral pulmonary embolism with a diagnosis of DVT and PE in January 2018 for which he has been on chronic Eliquis therapy.  He presents for initial cardiology evaluation.   Past Medical History:  Diagnosis Date   Arthritis    "right hand" (06/15/2016)   Childhood asthma    Chronic anticoagulation 08/30/2017   DVT, lower extremity, proximal, acute, right ([place]) 08/24/2016   06/15/16   GERD (gastroesophageal reflux disease)    Hearing loss    History of kidney stones    Hyperlipidemia    Hypertension    Pneumonia    "I've had it ~ 9 times as an adult; last time was 04/2016" (06/15/2016)    Past Surgical History:  Procedure Laterality Date   AMPUTATION TOE Right 09/27/2018   Procedure: Right 2nd toe amputation;  Surgeon: Wylene Simmer, MD;  Location: [facility];  Service: Orthopedics;  Laterality: Right;  13mn   CARPAL TUNNEL RELEASE Left 08/16/2020   Procedure: LEFT CARPAL TUNNEL RELEASE;  Surgeon: KLeanora Cover MD;  Location: [facility];  Service: Orthopedics;  Laterality: Left;   CATARACT EXTRACTION W/ INTRAOCULAR LENS  IMPLANT, BILATERAL Bilateral 04/2009   ELBOW BURSA SURGERY Right    FOOT SURGERY Right 2010   "attempted reconstruction" per pt   JOINT REPLACEMENT     KIDNEY STONE SURGERY     KNEE ARTHROSCOPY Right 10/08/2014   Procedure: ARTHROSCOPY KNEE;  Surgeon: Vickey Huger, MD;  Location: [facility];  Service: Orthopedics;  Laterality: Right;   KNEE ARTHROSCOPY Left    repair torn knee cartilage   QUADRICEPS TENDON REPAIR Right 10/08/2014   Procedure: REPAIR QUADRICEP TENDON;   Surgeon: Vickey Huger, MD;  Location: [facility];  Service: Orthopedics;  Laterality: Right;   TONSILLECTOMY     TOTAL KNEE ARTHROPLASTY  12/14/2011   Procedure: TOTAL KNEE ARTHROPLASTY;  Surgeon: Rudean Haskell, MD;  Location: [facility];  Service: Orthopedics;  Laterality: Left;    Current Medications: Outpatient Medications Prior to Visit  Medication Sig Dispense Refill   amLODipine (NORVASC) 5 MG tablet TAKE ONE TABLET BY MOUTH DAILY 90 tablet 3   clonazePAM (KLONOPIN) 0.5 MG tablet TAKE 1 TO 2 TABLETS BY MOUTH DAILY AS NEEDED FOR ANXIETY 60 tablet 1   Coenzyme Q10 (CO Q-10 PO) Take 1 tablet by mouth daily. 300 mg     doxycycline (VIBRA-TABS) 100 MG tablet Take 1 tablet (100 mg total) by mouth 2 (two) times daily. 20 tablet 0   ELIQUIS 2.5 MG TABS tablet TAKE 1 TABLET BY MOUTH TWICE A DAY 180 tablet 0   esomeprazole (NEXIUM) 40 MG capsule Take 1 capsule (40 mg total) by mouth 2 (two) times daily. 60 capsule 11   levofloxacin (LEVAQUIN) 500 MG tablet Take 1 tablet by mouth daily.     loratadine (CLARITIN) 10 MG tablet Take 10 mg by mouth daily as needed for allergies.     losartan (COZAAR) 100 MG tablet TAKE ONE TABLET BY MOUTH DAILY 90 tablet 3   lubiprostone (AMITIZA) 24 MCG capsule Take 1 capsule (24 mcg total) by mouth 2 (two) times daily with a meal. 60 capsule 11   Multiple Vitamins-Minerals (MULTIVITAMIN WITH MINERALS) tablet Take 1 tablet by mouth daily.     Omega-3 Fatty Acids (OMEGA 3 PO) Take by mouth.     ondansetron (ZOFRAN) 4 MG tablet TAKE ONE TABLET BY MOUTH EVERY 8 HOURS AS NEEDED FOR NAUSEA AND VOMITING 20 tablet 1   QUEtiapine (SEROQUEL) 50 MG tablet TAKE ONE TABLET BY MOUTH EVERY NIGHT AT BEDTIME 90 tablet 0   Testosterone 20.25 MG/ACT (1.62%) GEL 20.25 mg by Pump Prime route daily. Apply 6 pumps everyday as directed.     zolpidem (AMBIEN) 10 MG tablet TAKE ONE TABLET BY MOUTH EVERY NIGHT AT BEDTIME 90 tablet 1   HYDROcodone-acetaminophen (NORCO) 10-325 MG tablet One tab by mouth  every 6-8 hours  for chronic pain 90 tablet 0   Facility-Administered Medications Prior to Visit  Medication Dose Route Frequency Provider Last Rate Last Admin   mupirocin cream (BACTROBAN) 2 %   Topical Once [name], Cresenciano Lick, MD         Allergies:   Oxycodone-acetaminophen, Percocet [oxycodone-acetaminophen], Sulfa antibiotics, and Sulfamethoxazole   Social History   Socioeconomic History   Marital status: Married    Spouse name: Not on file   Number of children: 2   Years of education: plus some graduate work   Highest education level: Bachelor's degree (e.g., BA, AB, BS)  Occupational History  Not on file  Tobacco Use   Smoking status: Former    Packs/day: 0.75    Years: 37.00    Total pack years: 27.75    Types: Cigarettes    Quit date: 12/14/1991    Years since quitting: 30.3   Smokeless tobacco: Never  Vaping Use   Vaping Use: Never used  Substance and Sexual Activity   Alcohol use: Yes    Alcohol/week: 7.0 - 10.0 standard drinks of alcohol    Types: 7 - 10 Shots of liquor per week    Comment: 2 cocktails daily   Drug use: No   Sexual activity: Yes  Other Topics Concern   Not on file  Social History Narrative   Lives at home with wife   Right handed   Caffeine: 2 cups/day   Social Determinants of Health   Financial Resource Strain: Not on file  Food Insecurity: Not on file  Transportation Needs: Not on file  Physical Activity: Not on file  Stress: Not on file  Social Connections: Not on file    Socially he is married in his second marriage.  He is a former Engineer, structural.  He has a Scientist, water quality.  He is retired International aid/development worker.  Family History:  The patient's family history includes Parkinson's disease in his father.  His father died at age 34 with Parkinson's disease and had a history of diabetes.  His mother died at age 16.  He has 2 sisters.  He has a son age 34 and a daughter age 63 in good health.  He has 5 grandchildren and 4  great-grandchildren.  ROS General: Negative; No fevers, chills, or night sweats;  HEENT: Hearing loss ; No changes in vision, sinus congestion, difficulty swallowing Pulmonary: Remote history of PE on long-term Eliquis Cardiovascular: See HPI GI: Negative; No nausea, vomiting, diarrhea, or abdominal pain GU: Negative; No dysuria, hematuria, or difficulty voiding Musculoskeletal: Negative; no myalgias, joint pain, or weakness Hematologic/Oncology: Negative; no easy bruising, bleeding Endocrine: Negative; no heat/cold intolerance; no diabetes Neuro: Negative; no changes in balance, headaches Skin: Negative; No rashes or skin lesions Psychiatric: Negative; No behavioral problems, depression Sleep: Negative; No snoring, daytime sleepiness, hypersomnolence, bruxism, restless legs, hypnogognic hallucinations, no cataplexy Other comprehensive 14 point system review is negative.   PHYSICAL EXAM:   VS:  BP 138/71 (BP Location: Left Arm, Patient Position: Sitting)   Pulse 78   Ht '5\' 6"'$  (1.676 m)   Wt 175 lb (79.4 kg)   SpO2 98%   BMI 28.25 kg/m     Repeat blood pressure by me was 130/60 supine and 138/64 standing  Wt Readings from Last 3 Encounters:  04/07/22 175 lb (79.4 kg)  04/06/22 177 lb 6.4 oz (80.5 kg)  03/04/22 173 lb (78.5 kg)    General: Alert, oriented, no distress.  Skin: normal turgor, no rashes, warm and dry HEENT: Normocephalic, atraumatic. Pupils equal round and reactive to light; sclera anicteric; extraocular muscles intact; Nose without nasal septal hypertrophy Mouth/Parynx benign; Mallinpatti scale 3 Neck: No JVD, no carotid bruits; normal carotid upstroke Lungs: clear to ausculatation and percussion; no wheezing or rales Chest wall: without tenderness to palpitation Heart: PMI not displaced, RRR, s1 s2 normal, 1/6 systolic murmur, no diastolic murmur, no rubs, gallops, thrills, or heaves Abdomen: soft, nontender; no hepatosplenomehaly, BS+; abdominal aorta  nontender and not dilated by palpation. Back: no CVA tenderness Pulses 2+ Musculoskeletal: full range of motion, normal strength, no joint deformities Extremities:  no clubbing cyanosis or edema, Homan's sign negative  Neurologic: grossly nonfocal; Cranial nerves grossly wnl Psychologic: Normal mood and affect   Studies/Labs Reviewed:   April 07, 2022 ECG (independently read by me): Sinus rhythm at 78; 1st degree AV block, PR 228 msec, RBBB with repolarization changes  Recent Labs:    Latest Ref Rng & Units 04/07/2022   10:58 AM 03/03/2022   10:03 AM 02/21/2021    9:36 AM  BMP  Glucose 65 - 139 mg/dL 103  107  116   BUN 7 - 25 mg/dL 26  28  33   Creatinine 0.70 - 1.22 mg/dL 1.83  1.89  1.72   BUN/Creat Ratio 6 - 22 (calc) '14  15  19   '$ Sodium 135 - 146 mmol/L 141  139  139   Potassium 3.5 - 5.3 mmol/L 5.0  4.4  4.2   Chloride 98 - 110 mmol/L 107  102  105   CO2 20 - 32 mmol/L '28  28  30   '$ Calcium 8.6 - 10.3 mg/dL 9.0  9.1  8.7         Latest Ref Rng & Units 04/07/2022   10:58 AM 03/03/2022   10:03 AM 02/21/2021    9:36 AM  Hepatic Function  Total Protein 6.1 - 8.1 g/dL 6.2  6.6  6.5   AST 10 - 35 U/L '9  9  11   '$ ALT 9 - 46 U/L '7  5  7   '$ Total Bilirubin 0.2 - 1.2 mg/dL 1.4  1.7  1.4        Latest Ref Rng & Units 04/07/2022   10:58 AM 03/03/2022   10:03 AM 02/21/2021    9:36 AM  CBC  WBC 3.8 - 10.8 Thousand/uL 4.5  3.8  4.8   Hemoglobin 13.2 - 17.1 g/dL 12.4  13.7  14.2   Hematocrit 38.5 - 50.0 % 35.8  39.1  43.4   Platelets 140 - 400 Thousand/uL 159  165  168    Lab Results  Component Value Date   MCV 99.2 04/07/2022   MCV 98.2 03/03/2022   MCV 99.8 02/21/2021   Lab Results  Component Value Date   TSH 3.79 04/07/2022   Lab Results  Component Value Date   HGBA1C 5.3 03/03/2022     BNP    Component Value Date/Time   BNP 121 (H) 04/07/2022 1058    ProBNP No results found for: "PROBNP"   Lipid Panel     Component Value Date/Time   CHOL 140  03/03/2022 1003   TRIG 75 03/03/2022 1003   HDL 46 03/03/2022 1003   CHOLHDL 3.0 03/03/2022 1003   VLDL 42 (H) 02/18/2016 1011   LDLCALC 78 03/03/2022 1003     RADIOLOGY: DG Chest 2 View  Result Date: 04/06/2022 CLINICAL DATA:  Shortness of breath, difficulty breathing EXAM: CHEST - 2 VIEW COMPARISON:  06/15/2016 FINDINGS: Normal heart size, mediastinal contours, and pulmonary vascularity. Atherosclerotic calcification aorta. Lungs clear. No pulmonary infiltrate, pleural effusion, or pneumothorax. Osseous structures unremarkable. IMPRESSION: No acute abnormalities. Aortic Atherosclerosis (ICD10-I70.0). Electronically Signed   By: Lavonia Dana M.D.   On: 04/06/2022 14:33     Additional studies/ records that were reviewed today include:   I reviewed the records of Dr. Renold Genta.   ASSESSMENT:    1. Bradycardia   2. Murmur, cardiac   3. Chronic kidney disease, stage IV (severe) ([place])   4. Chronic anticoagulation   5. History of pulmonary  embolism: Bilateral PE with DVT lower extremity 2017      PLAN:  Roberto Reid is a very pleasant 88 year old gentleman who is a longstanding patient of Dr. Renold Genta and recently noticed episodes of bradycardia with heart rates decreasing to 41 bpm and 42 bpm associated with mild shortness of breath.  He has not been on any rate control medication.  He has a history of hypertension and has been on amlodipine 5 mg daily in addition to losartan 100 mg daily.  His blood pressure today was excellent on repeat by me at 132/60 supine and 138/64 standing.  Remotely he had experienced a pulmonary embolism following an episode of pneumonia and was followed closely by Dr. Algis Greenhouse who has since retired but it was advised that he continue anticoagulation indefinitely.  Most recently he has been on low-dose Eliquis at 2.5 mg twice a day in light of his age and renal function.  He has a history of GI reflux and continues to be on Nexium.  At times he has taken  Klonopin for anxiety and has recently noticed some increase stress associated with moving from his long term dwelling with a large house and such feel [place].  He denies any presyncope or syncope.  He is unaware of any significant cardiac murmur or evidence of aortic valve disease.  I reviewed his most recent laboratory which appeared stable except he has CKD with most recent creatinine at 1.89.  He has been on losartan for some time this will need to be followed.  He exercises regularly on a recumbent bike and often plays golf several days per week.  Presently, I am recommending he undergo an echo Doppler study for assessment of LV systolic diastolic function and valvular architecture.  With his recent episode of bradycardia, I am scheduling him to wear a Zio patch 2-week monitor.  He may very well have sick sinus syndrome or other heart block.  His ECG today shows sinus rhythm at 78 with first-degree AV block and right bundle branch block.  He will contact us if he experiences any recent or change in symptomatology.  Otherwise I will see him in 6 weeks for reevaluation or sooner as needed.  Medication Adjustments/Labs and Tests Ordered: Current medicines are reviewed at length with the patient today.  Concerns regarding medicines are outlined above.  Medication changes, Labs and Tests ordered today are listed in the Patient Instructions below. Patient Instructions  Medication Instructions:  The current medical regimen is effective;  continue present plan and medications.  *If you need a refill on your cardiac medications before your next appointment, please call your pharmacy*   Testing/Procedures:  Echocardiogram - Your physician has requested that you have an echocardiogram. Echocardiography is a painless test that uses sound waves to create images of your heart. It provides your doctor with information about the size and shape of your heart and how well your heart's  chambers and valves are working. This procedure takes approximately one hour. There are no restrictions for this procedure.   ZIO XT- Long Term Monitor Instructions  Your physician has requested you wear a ZIO patch monitor for 14 days.  This is a single patch monitor. [employer] supplies one patch monitor per enrollment. Additional stickers are not available. Please do not apply patch if you will be having a Nuclear Stress Test,  Echocardiogram, Cardiac CT, MRI, or Chest Xray during the period you would be wearing the  monitor.  The patch cannot be worn during these tests. You cannot remove and re-apply the  ZIO XT patch monitor.  Your ZIO patch monitor will be mailed 3 day USPS to your address on file. It may take 3-5 days  to receive your monitor after you have been enrolled.  Once you have received your monitor, please review the enclosed instructions. Your monitor  has already been registered assigning a specific monitor serial # to you.  Billing and Patient Assistance Program Information  We have supplied [employer] with any of your insurance information on file for billing purposes. [employer] offers a sliding scale Patient Assistance Program for patients that do not have  insurance, or whose insurance does not completely cover the cost of the ZIO monitor.  You must apply for the Patient Assistance Program to qualify for this discounted rate.  To apply, please call [employer] at [phone], select option 4, select option 2, ask to apply for  Patient Assistance Program. Theodore Demark will ask your household income, and how many people  are in your household. They will quote your out-of-pocket cost based on that information.  [employer] will also be able to set up a 91-month interest-free payment plan if needed.  Applying the monitor   Shave hair from upper left chest.  Hold abrader disc by orange tab. Rub abrader in 40 strokes over the upper left chest as  indicated in your monitor instructions.   Clean area with 4 enclosed alcohol pads. Let dry.  Apply patch as indicated in monitor instructions. Patch will be placed under collarbone on left  side of chest with arrow pointing upward.  Rub patch adhesive wings for 2 minutes. Remove white label marked "1". Remove the white  label marked "2". Rub patch adhesive wings for 2 additional minutes.  While looking in a mirror, press and release button in center of patch. A small green light will  flash 3-4 times. This will be your only indicator that the monitor has been turned on.  Do not shower for the first 24 hours. You may shower after the first 24 hours.  Press the button if you feel a symptom. You will hear a small click. Record Date, Time and  Symptom in the Patient Logbook.  When you are ready to remove the patch, follow instructions on the last 2 pages of Patient  Logbook. Stick patch monitor onto the last page of Patient Logbook.  Place Patient Logbook in the blue and white box. Use locking tab on box and tape box closed  securely. The blue and white box has prepaid postage on it. Please place it in the mailbox as  soon as possible. Your physician should have your test results approximately 7 days after the  monitor [facility]  Call ICassodayat [phone] you have questions regarding  your ZIO XT patch monitor. Call them immediately if you see an orange light blinking on your  monitor.  If your monitor falls off in less than 4 days, contact our Monitor department at [phone]  If your monitor becomes loose or falls off after 4 days call [employer] at [phone]  suggestions on securing your monitor    Follow-Up: [facility] you and your health needs are our priority.  As part of our continuing mission to provide you with exceptional heart care, we have created designated Provider Care Teams.  These Care Teams include your primary Cardiologist (physician)  and Advanced Practice Providers (  APPs -  Physician Assistants and Nurse Practitioners) who all work together to provide you with the care you need, when you need it.  We recommend signing up for the patient portal called "MyChart".  Sign up information is provided on this After Visit Summary.  MyChart is used to connect with patients for Virtual Visits (Telemedicine).  Patients are able to view lab/test results, encounter notes, upcoming appointments, etc.  Non-urgent messages can be sent to your provider as well.   To learn more about what you can do with MyChart, go to [email or web address].    Your next appointment:   6 week(s)  The format for your next appointment:   In Person  Provider:   Shelva Majestic, MD           Signed, Shelva Majestic, MD  04/14/2022 1:25 PM    [facility] [address] [place], [place], [place]  [address] Phone: [phone]

## 2022-04-08 LAB — CBC WITH DIFFERENTIAL/PLATELET
Absolute Monocytes: 261 cells/uL (ref 200–950)
Basophils Absolute: 18 cells/uL (ref 0–200)
Basophils Relative: 0.4 %
Eosinophils Absolute: 50 cells/uL (ref 15–500)
Eosinophils Relative: 1.1 %
HCT: 35.8 % — ABNORMAL LOW (ref 38.5–50.0)
Hemoglobin: 12.4 g/dL — ABNORMAL LOW (ref 13.2–17.1)
Lymphs Abs: 842 cells/uL — ABNORMAL LOW (ref 850–3900)
MCH: 34.3 pg — ABNORMAL HIGH (ref 27.0–33.0)
MCHC: 34.6 g/dL (ref 32.0–36.0)
MCV: 99.2 fL (ref 80.0–100.0)
MPV: 11.9 fL (ref 7.5–12.5)
Monocytes Relative: 5.8 %
Neutro Abs: 3330 cells/uL (ref 1500–7800)
Neutrophils Relative %: 74 %
Platelets: 159 10*3/uL (ref 140–400)
RBC: 3.61 10*6/uL — ABNORMAL LOW (ref 4.20–5.80)
RDW: 12.8 % (ref 11.0–15.0)
Total Lymphocyte: 18.7 %
WBC: 4.5 10*3/uL (ref 3.8–10.8)

## 2022-04-08 LAB — COMPLETE METABOLIC PANEL WITH GFR
AG Ratio: 2.1 (calc) (ref 1.0–2.5)
ALT: 7 U/L — ABNORMAL LOW (ref 9–46)
AST: 9 U/L — ABNORMAL LOW (ref 10–35)
Albumin: 4.2 g/dL (ref 3.6–5.1)
Alkaline phosphatase (APISO): 32 U/L — ABNORMAL LOW (ref 35–144)
BUN/Creatinine Ratio: 14 (calc) (ref 6–22)
BUN: 26 mg/dL — ABNORMAL HIGH (ref 7–25)
CO2: 28 mmol/L (ref 20–32)
Calcium: 9 mg/dL (ref 8.6–10.3)
Chloride: 107 mmol/L (ref 98–110)
Creat: 1.83 mg/dL — ABNORMAL HIGH (ref 0.70–1.22)
Globulin: 2 g/dL (calc) (ref 1.9–3.7)
Glucose, Bld: 103 mg/dL (ref 65–139)
Potassium: 5 mmol/L (ref 3.5–5.3)
Sodium: 141 mmol/L (ref 135–146)
Total Bilirubin: 1.4 mg/dL — ABNORMAL HIGH (ref 0.2–1.2)
Total Protein: 6.2 g/dL (ref 6.1–8.1)
eGFR: 35 mL/min/{1.73_m2} — ABNORMAL LOW (ref >=60)

## 2022-04-08 LAB — BRAIN NATRIURETIC PEPTIDE: Brain Natriuretic Peptide: 121 pg/mL — ABNORMAL HIGH (ref <100)

## 2022-04-08 LAB — TSH: TSH: 3.79 mIU/L (ref 0.40–4.50)

## 2022-04-08 LAB — T4, FREE: Free T4: 0.9 ng/dL (ref 0.8–1.8)

## 2022-04-10 DIAGNOSIS — R011 Cardiac murmur, unspecified: Secondary | ICD-10-CM

## 2022-04-10 DIAGNOSIS — R001 Bradycardia, unspecified: Secondary | ICD-10-CM

## 2022-04-14 ENCOUNTER — Encounter: Payer: Self-pay | Admitting: Cardiovascular Disease

## 2022-04-30 ENCOUNTER — Other Ambulatory Visit: Payer: Self-pay | Admitting: Internal Medicine

## 2022-04-30 MED ORDER — HYDROCODONE-ACETAMINOPHEN 10-325 MG PO TABS: ORAL_TABLET | ORAL | 0 refills | Status: DC

## 2022-04-30 NOTE — Telephone Encounter (Signed)
Roberto Reid [phone]  Roberto called to get refill on below medication, and he also checked with pharmacy and they do have it.  HYDROcodone-acetaminophen [facility]) 10-325 MG tablet   [facility] [id number] [place], [place] - [place] Phone: [phone]  Fax: [phone]

## 2022-05-06 ENCOUNTER — Telehealth: Payer: Self-pay | Admitting: Cardiovascular Disease

## 2022-05-06 ENCOUNTER — Ambulatory Visit (HOSPITAL_COMMUNITY): Payer: Medicare Other | Attending: Cardiovascular Disease

## 2022-05-06 DIAGNOSIS — R011 Cardiac murmur, unspecified: Secondary | ICD-10-CM | POA: Insufficient documentation

## 2022-05-06 DIAGNOSIS — R001 Bradycardia, unspecified: Secondary | ICD-10-CM | POA: Insufficient documentation

## 2022-05-06 LAB — ECHOCARDIOGRAM COMPLETE
AR max vel: 1.3 cm2
AV Area VTI: 1.2 cm2
AV Area mean vel: 1.13 cm2
AV Mean grad: 24 mmHg
AV Peak grad: 36 mmHg
Ao pk vel: 3 m/s
Area-P 1/2: 3.63 cm2
MV M vel: 5.06 m/s
MV Peak grad: 102.4 mmHg
S' Lateral: 3.1 cm

## 2022-05-06 NOTE — Telephone Encounter (Signed)
Zio calling to give critical results on patient. Zio hung up before I could connect.

## 2022-05-06 NOTE — Telephone Encounter (Signed)
Returned call to [facility] for critical monitor results. Spoke with patient. He stated that during the wear-time, he had 3 episodes of bradycardia, where he felt dizzy and lightheaded. He cannot get BP at this time. He recently moved and things are packed away. Spoke with Dr. Claiborne Billings about patient and gave him the monitor rhythm. He will review the rhythm today.

## 2022-05-19 ENCOUNTER — Encounter: Payer: Self-pay | Admitting: *Deleted

## 2022-05-20 ENCOUNTER — Encounter: Payer: Self-pay | Admitting: Cardiovascular Disease

## 2022-05-20 ENCOUNTER — Ambulatory Visit: Payer: Medicare Other | Attending: Cardiovascular Disease | Admitting: Cardiovascular Disease

## 2022-05-20 VITALS — BP 142/70 | HR 57 | Ht 70.0 in | Wt 170.2 lb

## 2022-05-20 DIAGNOSIS — I350 Nonrheumatic aortic (valve) stenosis: Secondary | ICD-10-CM | POA: Diagnosis not present

## 2022-05-20 DIAGNOSIS — I441 Atrioventricular block, second degree: Secondary | ICD-10-CM

## 2022-05-20 NOTE — Progress Notes (Signed)
Cardiology Office Note    Date:  05/20/2022   ID:  Roberto Reid, DOB 1932/06/14, MRN [id number]  PCP:  Roberto Showers, MD  Cardiologist:  Roberto Majestic, MD   No chief complaint on file.   6 week F/U cardiology evaluation initially referred by Roberto Reid due to concerns regarding bradycardia and shortness of breath.  History of Present Illness:  Roberto Reid is a 88 y.o. male who is a longstanding patient of Roberto Reid. Roberto Reid.  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  He has a history of right bundle branch block and remotely had seen Roberto Reid in 2008 had a nuclear study which apparently showed normal perfusion.  He admits to a longstanding history of hypertension.  He tries to stay active and often rides a recumbent bike, he often plays golf approximately 3 days/week.  He denies any associated lightheadedness or dizziness.  He had recently seen Roberto Reid and she was concerned regarding reduced heart rate which she had experienced over the weekend with heart rate decreasing to 51 and on another occasion down to 42 bpm.  He was not on beta-blocker therapy.  He denied associated chest pain, presyncope or syncope.  He had undergone laboratory on March 03, 2022 which showed a hemoglobin of 13.7 hematocrit of 39.1 with normal platelet count at 1 65,000.  Lipid studies reveal total cholesterol 140 HDL 46 triglycerides 75 and LDL 78.  Glucose was 107.  He had CKD with creatinine 1.89 consistent with stage IIIb CKD with GFR estimated at 34.  Hemoglobin A1c was 5.3.  Urinalysis was negative.  PSA was 0.52.  I saw him for my initial evaluation on April 14, 2022.  At that time he admitted that he was under increased stress  with the selling of his [place] large house and downsizing to a home in [place].  His wife recently has started working with an  Insurance underwriter.  He denies any exertionally precipitated chest tightness or change in exercise tolerance.  He has history of mild glucose intolerance, GE reflux, hyperlipidemia, chronic insomnia, as well as Barrett's esophagus and adenomatous colonic polyp.  There is remote history of kidney stone.  He had suffered a lower lobe pneumonia in 2017 and has a history of hearing loss.  He had been followed by Roberto Reid following a diagnosis of bilateral pulmonary embolism with a diagnosis of DVT and PE in January 2018 for which he has been on chronic Eliquis therapy.  During that evaluation he recommended he undergo a 2D echo Doppler study for assessment of LV systolic and diastolic function and valvular architecture.  With his episode of bradycardia I scheduled him to wear a 2-week Zio patch monitor and raise concern for possible sick sinus syndrome or other heart block.  His ECG showed sinus rhythm with first-degree AV block and right bundle branch block.  He underwent his 2D echo Doppler study in May 06, 2022.  This showed normal LV function with EF estimated at 60 to 65% with grade 2 diastolic dysfunction.  There was mild mitral regurgitation.  There was moderate aortic valve stenosis with a mean gradient of 24 and estimated valve area of 1.2 cm.  Mean gradient was 24 with peak gradient 36 mmHg.  A Zio patch monitor showed predominant sinus rhythm with average rate at 71  bpm.  The slowest sinus rhythm was sinus bradycardia at 50 bpm with maximum sinus tachycardia at 112 bpm.  He had first-degree AV block with bundle branch block.  There was an episode of nonsustained VT lasting 6 beats at a average rate of 103 bpm (maximum 146 bpm.  There were 5 short episodes of SVT with the fastest interval lasting 4 beats with a maximum rate of 164 and the longest lasting 5 beats with an average rate at 116.  Secondary heart block was noted.  The slowest heart rate was 33 bpm which occurred at 5:54 AM on November 23 and was  interpreted as second-degree Mobitz 2 block.  He had frequent PACs, occasional atrial couplets.  Ventricular ectopy was rare.  Presently, he denies any chest pain or shortness of breath.  He denies any presyncope or syncope.  He experiences mild shortness of breath with activity.  He presents for follow-up evaluation.   Past Medical History:  Diagnosis Date   Arthritis    "right hand" (06/15/2016)   Childhood asthma    Chronic anticoagulation 08/30/2017   DVT, lower extremity, proximal, acute, right ([facility]) 08/24/2016   06/15/16   GERD (gastroesophageal reflux disease)    Hearing loss    History of kidney stones    Hyperlipidemia    Hypertension    Pneumonia    "I've had it ~ 9 times as an adult; last time was 04/2016" (06/15/2016)    Past Surgical History:  Procedure Laterality Date   AMPUTATION TOE Right 09/27/2018   Procedure: Right 2nd toe amputation;  Surgeon: Wylene Simmer, MD;  Location: [facility];  Service: Orthopedics;  Laterality: Right;  56mn   CARPAL TUNNEL RELEASE Left 08/16/2020   Procedure: LEFT CARPAL TUNNEL RELEASE;  Surgeon: KLeanora Cover MD;  Location[facility]  Service: Orthopedics;  Laterality: Left;   CATARACT EXTRACTION W/ INTRAOCULAR LENS  IMPLANT, BILATERAL Bilateral 04/2009   ELBOW BURSA SURGERY Right    FOOT SURGERY Right 2010   "attempted reconstruction" per pt   JOINT REPLACEMENT     KIDNEY STONE SURGERY     KNEE ARTHROSCOPY Right 10/08/2014   Procedure: ARTHROSCOPY KNEE;  Surgeon: SVickey Huger MD;  [facility]  Service: Orthopedics;  Laterality: Right;   KNEE ARTHROSCOPY Left    repair torn knee cartilage   QUADRICEPS TENDON REPAIR Right 10/08/2014   Procedure: REPAIR QUADRICEP TENDON;  Surgeon: SVickey Huger MD;  Location[facility]  Service: Orthopedics;  Laterality: Right;   TONSILLECTOMY     TOTAL KNEE ARTHROPLASTY  12/14/2011   Procedure: TOTAL KNEE ARTHROPLASTY;  Surgeon: SRudean Haskell MD;  [facility]  Service:  Orthopedics;  Laterality: Left;    Current Medications: Outpatient Medications Prior to Visit  Medication Sig Dispense Refill   amLODipine (NORVASC) 5 MG tablet TAKE ONE TABLET BY MOUTH DAILY 90 tablet 3   clonazePAM (KLONOPIN) 0.5 MG tablet TAKE 1 TO 2 TABLETS BY MOUTH DAILY AS NEEDED FOR ANXIETY 60 tablet 1   Coenzyme Q10 (CO Q-10 PO) Take 1 tablet by mouth daily. 300 mg     doxycycline (VIBRA-TABS) 100 MG tablet Take 1 tablet (100 mg total) by mouth 2 (two) times daily. 20 tablet 0   ELIQUIS 2.5 MG TABS tablet TAKE 1 TABLET BY MOUTH TWICE A DAY 180 tablet 0   esomeprazole (NEXIUM) 40 MG capsule Take 1 capsule (40 mg total) by mouth 2 (two) times daily. 60 capsule 11   HYDROcodone-acetaminophen (  NORCO) 10-325 MG tablet One tab by mouth every 6-8 hours  for chronic pain 90 tablet 0   levofloxacin (LEVAQUIN) 500 MG tablet Take 1 tablet by mouth daily.     loratadine (CLARITIN) 10 MG tablet Take 10 mg by mouth daily as needed for allergies.     losartan (COZAAR) 100 MG tablet TAKE ONE TABLET BY MOUTH DAILY 90 tablet 3   lubiprostone (AMITIZA) 24 MCG capsule Take 1 capsule (24 mcg total) by mouth 2 (two) times daily with a meal. 60 capsule 11   Multiple Vitamins-Minerals (MULTIVITAMIN WITH MINERALS) tablet Take 1 tablet by mouth daily.     Omega-3 Fatty Acids (OMEGA 3 PO) Take by mouth.     ondansetron (ZOFRAN) 4 MG tablet TAKE ONE TABLET BY MOUTH EVERY 8 HOURS AS NEEDED FOR NAUSEA AND VOMITING 20 tablet 1   QUEtiapine (SEROQUEL) 50 MG tablet TAKE ONE TABLET BY MOUTH EVERY NIGHT AT BEDTIME 90 tablet 0   Testosterone 20.25 MG/ACT (1.62%) GEL 20.25 mg by Pump Prime route daily. Apply 6 pumps everyday as directed.     zolpidem (AMBIEN) 10 MG tablet TAKE ONE TABLET BY MOUTH EVERY NIGHT AT BEDTIME 90 tablet 1   Facility-Administered Medications Prior to Visit  Medication Dose Route Frequency Provider Last Rate Last Admin   mupirocin cream (BACTROBAN) 2 %   Topical Once Roberto Reid, Cresenciano Lick, MD          Allergies:   Oxycodone-acetaminophen, Percocet [oxycodone-acetaminophen], Sulfa antibiotics, and Sulfamethoxazole   Social History   Socioeconomic History   Marital status: Married    Spouse name: Not on file   Number of children: 2   Years of education: plus some graduate work   Highest education level: Bachelor's degree (e.g., BA, AB, BS)  Occupational History   Not on file  Tobacco Use   Smoking status: Former    Packs/day: 0.75    Years: 37.00    Total pack years: 27.75    Types: Cigarettes    Quit date: 12/14/1991    Years since quitting: 30.4   Smokeless tobacco: Never  Vaping Use   Vaping Use: Never used  Substance and Sexual Activity   Alcohol use: Yes    Alcohol/week: 7.0 - 10.0 standard drinks of alcohol    Types: 7 - 10 Shots of liquor per week    Comment: 2 cocktails daily   Drug use: No   Sexual activity: Yes  Other Topics Concern   Not on file  Social History Narrative   Lives at home with wife   Right handed   Caffeine: 2 cups/day   Social Determinants of Health   Financial Resource Strain: Not on file  Food Insecurity: Not on file  Transportation Needs: Not on file  Physical Activity: Not on file  Stress: Not on file  Social Connections: Not on file    Socially he is married in his second marriage.  He is a former Engineer, structural.  He has a Scientist, water quality.  He is retired International aid/development worker.  Family History:  The patient's family history includes Parkinson's disease in his father.  His father died at age 77 with Parkinson's disease and had a history of diabetes.  His mother died at age 51.  He has 2 sisters.  He has a son age 34 and a daughter age 84 in good health.  He has 5 grandchildren and 4 great-grandchildren.  ROS General: Negative; No fevers, chills, or night sweats;  HEENT: Hearing loss ; No changes in vision, sinus congestion, difficulty swallowing Pulmonary: Remote history of PE on long-term Eliquis Cardiovascular: See  HPI GI: Negative; No nausea, vomiting, diarrhea, or abdominal pain GU: Negative; No dysuria, hematuria, or difficulty voiding Musculoskeletal: Negative; no myalgias, joint pain, or weakness Hematologic/Oncology: Negative; no easy bruising, bleeding Endocrine: Negative; no heat/cold intolerance; no diabetes Neuro: Negative; no changes in balance, headaches Skin: Negative; No rashes or skin lesions Psychiatric: Negative; No behavioral problems, depression Sleep: Negative; No snoring, daytime sleepiness, hypersomnolence, bruxism, restless legs, hypnogognic hallucinations, no cataplexy Other comprehensive 14 point system review is negative.   PHYSICAL EXAM:   VS:  BP (!) 142/70   Pulse (!) 57   Ht _0  (1.778 m)   Wt 170 lb 3.2 oz (77.2 kg)   SpO2 99%   BMI 24.42 kg/m     Repeat blood pressure by me was 124/64 supine it was 116/62 standing  Wt Readings from Last 3 Encounters:  05/20/22 170 lb 3.2 oz (77.2 kg)  04/07/22 175 lb (79.4 kg)  04/06/22 177 lb 6.4 oz (80.5 kg)    General: Alert, oriented, no distress.  Skin: normal turgor, no rashes, warm and dry HEENT: Normocephalic, atraumatic. Pupils equal round and reactive to light; sclera anicteric; extraocular muscles intact;  Nose without nasal septal hypertrophy Mouth/Parynx benign; Mallinpatti scale 3 Neck: No JVD, no carotid bruits; normal carotid upstroke Lungs: clear to ausculatation and percussion; no wheezing or rales Chest wall: without tenderness to palpitation Heart: PMI not displaced, RRR, s1 s2 normal, 1/6 systolic murmur, no diastolic murmur, no rubs, gallops, thrills, or heaves Abdomen: soft, nontender; no hepatosplenomehaly, BS+; abdominal aorta nontender and not dilated by palpation. Back: no CVA tenderness Pulses 2+ Musculoskeletal: full range of motion, normal strength, no joint deformities Extremities: no clubbing cyanosis or edema, Homan's sign negative  Neurologic: grossly nonfocal; Cranial nerves grossly  wnl Psychologic: Normal mood and affect    Studies/Labs Reviewed:   May 20, 2022 ECG (independently read by me): Sinus rhythm at 80; 1st degree AV block, RBBB, PR 220 msec  April 07, 2022 ECG (independently read by me): Sinus rhythm at 78; 1st degree AV block, PR 228 msec, RBBB with repolarization changes  Recent Labs:    Latest Ref Rng & Units 04/07/2022   10:58 AM 03/03/2022   10:03 AM 02/21/2021    9:36 AM  BMP  Glucose 65 - 139 mg/dL 103  107  116   BUN 7 - 25 mg/dL 26  28  33   Creatinine 0.70 - 1.22 mg/dL 1.83  1.89  1.72   BUN/Creat Ratio 6 - 22 (calc) _1 Sodium 135 - 146 mmol/L 141  139  139   Potassium 3.5 - 5.3 mmol/L 5.0  4.4  4.2   Chloride 98 - 110 mmol/L 107  102  105   CO2 20 - 32 mmol/L _2 Calcium 8.6 - 10.3 mg/dL 9.0  9.1  8.7         Latest Ref Rng & Units 04/07/2022   10:58 AM 03/03/2022   10:03 AM 02/21/2021    9:36 AM  Hepatic Function  Total Protein 6.1 - 8.1 g/dL 6.2  6.6  6.5   AST 10 - 35 U/L _3 ALT 9 - 46 U/L _4 Total Bilirubin 0.2 - 1.2 mg/dL 1.4  1.7  1.4  Latest Ref Rng & Units 04/07/2022   10:58 AM 03/03/2022   10:03 AM 02/21/2021    9:36 AM  CBC  WBC 3.8 - 10.8 Thousand/uL 4.5  3.8  4.8   Hemoglobin 13.2 - 17.1 g/dL 12.4  13.7  14.2   Hematocrit 38.5 - 50.0 % 35.8  39.1  43.4   Platelets 140 - 400 Thousand/uL 159  165  168    Lab Results  Component Value Date   MCV 99.2 04/07/2022   MCV 98.2 03/03/2022   MCV 99.8 02/21/2021   Lab Results  Component Value Date   TSH 3.79 04/07/2022   Lab Results  Component Value Date   HGBA1C 5.3 03/03/2022     BNP    Component Value Date/Time   BNP 121 (H) 04/07/2022 1058    ProBNP No results found for: "PROBNP"   Lipid Panel     Component Value Date/Time   CHOL 140 03/03/2022 1003   TRIG 75 03/03/2022 1003   HDL 46 03/03/2022 1003   CHOLHDL 3.0 03/03/2022 1003   VLDL 42 (H) 02/18/2016 1011   LDLCALC 78 03/03/2022 1003      RADIOLOGY: ECHOCARDIOGRAM COMPLETE  Result Date: 05/06/2022    ECHOCARDIOGRAM REPORT   Patient Name:   Roberto Reid Date of Exam: 05/06/2022 Medical Rec #:  [id number]        Height:       66.0 in Accession #:    [id number]       Weight:       175.0 lb Date of Birth:  Dec 26, 1932         BSA:          1.889 m Patient Age:    73 years         BP:           153/81 mmHg Patient Gender: M                HR:           78 bpm. Exam Location:  [facility] Procedure: 2D Echo, Cardiac Doppler, Color Doppler and Strain Analysis Indications:    R01.1 Murmur  History:        Patient has prior history of Echocardiogram examinations, most                 recent 06/16/2016. DVT, CKD; Risk Factors:Hypertension, HLD and                 Diabetes.  Sonographer:    Marygrace Drought RCS Referring Phys: [place]  1. The aortic valve is tricuspid. There is moderate calcification of the aortic valve. There is moderate thickening of the aortic valve. Aortic valve regurgitation is not visualized. Moderate aortic valve stenosis. Aortic valve area, by VTI measures 1.20 cm. Aortic valve mean gradient measures 24.0 mmHg. Aortic valve Vmax measures 3.00 m/s.  2. Left ventricular ejection fraction, by estimation, is 60 to 65%. The left ventricle has normal function. The left ventricle has no regional wall motion abnormalities. Left ventricular diastolic parameters are consistent with Grade II diastolic dysfunction (pseudonormalization).  3. Right ventricular systolic function is normal. The right ventricular size is mildly enlarged. There is normal pulmonary artery systolic pressure. The estimated right ventricular systolic pressure is 53.6 mmHg.  4. The mitral valve is grossly normal. Mild mitral valve regurgitation. No evidence of mitral stenosis.  5. Tricuspid valve regurgitation is mild to moderate.  6. The inferior vena  cava is normal in size with greater than 50% respiratory variability, suggesting right  atrial pressure of 3 mmHg. Comparison(s): Changes from prior study are noted. FINDINGS  Left Ventricle: Left ventricular ejection fraction, by estimation, is 60 to 65%. The left ventricle has normal function. The left ventricle has no regional wall motion abnormalities. Global longitudinal strain performed but not reported based on interpreter judgement due to suboptimal tracking. The left ventricular internal cavity size was normal in size. There is no left ventricular hypertrophy. Left ventricular diastolic parameters are consistent with Grade II diastolic dysfunction (pseudonormalization). Right Ventricle: The right ventricular size is mildly enlarged. No increase in right ventricular wall thickness. Right ventricular systolic function is normal. There is normal pulmonary artery systolic pressure. The tricuspid regurgitant velocity is 2.67  m/s, and with an assumed right atrial pressure of 3 mmHg, the estimated right ventricular systolic pressure is 27.7 mmHg. Left Atrium: Left atrial size was normal in size. Right Atrium: Right atrial size was normal in size. Pericardium: There is no evidence of pericardial effusion. Mitral Valve: The mitral valve is grossly normal. Mild mitral valve regurgitation. No evidence of mitral valve stenosis. Tricuspid Valve: The tricuspid valve is grossly normal. Tricuspid valve regurgitation is mild to moderate. No evidence of tricuspid stenosis. Aortic Valve: The aortic valve is tricuspid. There is moderate calcification of the aortic valve. There is moderate thickening of the aortic valve. Aortic valve regurgitation is not visualized. Moderate aortic stenosis is present. Aortic valve mean gradient measures 24.0 mmHg. Aortic valve peak gradient measures 36.0 mmHg. Aortic valve area, by VTI measures 1.20 cm. Pulmonic Valve: The pulmonic valve was grossly normal. Pulmonic valve regurgitation is not visualized. No evidence of pulmonic stenosis. Aorta: The aortic root and ascending  aorta are structurally normal, with no evidence of dilitation. Venous: The right lower pulmonary vein is normal. The inferior vena cava is normal in size with greater than 50% respiratory variability, suggesting right atrial pressure of 3 mmHg. IAS/Shunts: The atrial septum is grossly normal.  LEFT VENTRICLE PLAX 2D LVIDd:         4.20 cm   Diastology LVIDs:         3.10 cm   LV e' medial:    8.16 cm/s LV PW:         1.10 cm   LV E/e' medial:  13.7 LV IVS:        1.10 cm   LV e' lateral:   12.90 cm/s LVOT diam:     2.10 cm   LV E/e' lateral: 8.7 LV SV:         86 LV SV Index:   45        2D Longitudinal Strain LVOT Area:     3.46 cm  2D Strain GLS (A2C):   -14.8 %                          2D Strain GLS (A3C):   -18.1 %                          2D Strain GLS (A4C):   -18.6 %                          2D Strain GLS Avg:     -18.2 % RIGHT VENTRICLE RV Basal diam:  4.30 cm RV Mid diam:    2.50 cm RV  S prime:     11.40 cm/s TAPSE (M-mode): 2.5 cm RVSP:           31.5 mmHg LEFT ATRIUM             Index        RIGHT ATRIUM           Index LA diam:        4.40 cm 2.33 cm/m   RA Pressure: 3.00 mmHg LA Vol (A2C):   53.1 ml 28.10 ml/m  RA Area:     17.40 cm LA Vol (A4C):   49.4 ml 26.14 ml/m  RA Volume:   41.50 ml  21.96 ml/m LA Biplane Vol: 52.2 ml 27.63 ml/m  AORTIC VALVE AV Area (Vmax):    1.30 cm AV Area (Vmean):   1.13 cm AV Area (VTI):     1.20 cm AV Vmax:           300.00 cm/s AV Vmean:          237.000 cm/s AV VTI:            0.715 m AV Peak Grad:      36.0 mmHg AV Mean Grad:      24.0 mmHg LVOT Vmax:         113.00 cm/s LVOT Vmean:        77.300 cm/s LVOT VTI:          0.247 m LVOT/AV VTI ratio: 0.35  AORTA Ao Root diam: 3.50 cm Ao Asc diam:  3.10 cm MITRAL VALVE                TRICUSPID VALVE MV Area (PHT):              TR Peak grad:   28.5 mmHg MV Decel Time:              TR Vmax:        267.00 cm/s MR Peak grad: 102.4 mmHg    Estimated RAP:  3.00 mmHg MR Mean grad: 70.0 mmHg     RVSP:           31.5 mmHg  MR Vmax:      506.00 cm/s MR Vmean:     405.0 cm/s    SHUNTS MV E velocity: 112.00 cm/s  Systemic VTI:  0.25 m MV A velocity: 70.70 cm/s   Systemic Diam: 2.10 cm MV E/A ratio:  1.58 Roberto Chiquito MD Electronically signed by Roberto Chiquito MD Signature Date/Time: 05/06/2022/1:07:31 PM    Final      Additional studies/ records that were reviewed today include:   I reviewed the records of Roberto Reid.  ECHO: 05/06/2021 IMPRESSIONS   1. The aortic valve is tricuspid. There is moderate calcification of the  aortic valve. There is moderate thickening of the aortic valve. Aortic  valve regurgitation is not visualized. Moderate aortic valve stenosis.  Aortic valve area, by VTI measures  1.20 cm. Aortic valve mean gradient measures 24.0 mmHg. Aortic valve Vmax  measures 3.00 m/s.   2. Left ventricular ejection fraction, by estimation, is 60 to 65%. The  left ventricle has normal function. The left ventricle has no regional  wall motion abnormalities. Left ventricular diastolic parameters are  consistent with Grade II diastolic  dysfunction (pseudonormalization).   3. Right ventricular systolic function is normal. The right ventricular  size is mildly enlarged. There is normal pulmonary artery systolic  pressure. The estimated right ventricular systolic pressure is 62.5 mmHg.  4. The mitral valve is grossly normal. Mild mitral valve regurgitation.  No evidence of mitral stenosis.   5. Tricuspid valve regurgitation is mild to moderate.   6. The inferior vena cava is normal in size with greater than 50%  respiratory variability, suggesting right atrial pressure of 3 mmHg.   Comparison(s): Changes from prior study are noted.   Patch Wear Time:  13 days and 23 hours (2023-11-10T10:05:21-0500 to 2023-11-24T10:05:17-0500)   Patient had a min HR of 33 bpm, max HR of 164 bpm, and avg HR of 71 bpm. Predominant underlying rhythm was Sinus Rhythm. First Degree AV Block was present. Bundle Branch  Block/IVCD was present. 1 run of Ventricular Tachycardia occurred lasting 6 beats  with a max rate of 146 bpm (avg 103 bpm). 5 Supraventricular Tachycardia runs occurred, the run with the fastest interval lasting 4 beats with a max rate of 164 bpm, the longest lasting 5 beats with an avg rate of 116 bpm. 4150 episode(s) of AV Block  (2nd Mobitz II) occurred, lasting a total of 10 hours 4 mins. AV Block (2nd Mobitz II) was detected within +/- 45 seconds of symptomatic patient event(s). Isolated SVEs were frequent (11.6%, [id number]), SVE Couplets were occasional (2.7%, [id number]), and  SVE Triplets were rare (<1.0%, 4437). Isolated VEs were rare (<1.0%), and no VE Couplets or VE Triplets were present. MD notification criteria for Symptomatic Second Degree AV Block, Mobitz II met - report posted prior to notification per account request  (JC).   The predominant rhythm was sinus with average rate 71, minimum sinus rhythm at 50 bpm and maximum sinus tachycardia at 112 bpm.  There was a 6 beat run of nonsustained VT at an average rate of 103 (max 146.  There were 5 episodes of SVT.  The slowest heart rate was 33 bpm which occurred at 5:54 AM and was consistent with second-degree Mobitz 2 block.  There were numerous episodes of second-degree AV block lasting a total of 10 hours 4 minutes.  Isolated PACs were frequent with occasional atrial couplets.  There was rare ventricular ectopy.    ASSESSMENT:    No diagnosis found.    PLAN:  Mr. Juandaniel Manfredo is a very pleasant 88 year old gentleman who is a longstanding patient of Roberto Reid and recently noticed episodes of bradycardia with heart rates decreasing to 41 bpm and 42 bpm associated with mild shortness of breath.  He has not been on any rate control medication.  He has a history of hypertension and has been on amlodipine 5 mg daily in addition to losartan 100 mg daily.  His blood pressure today was excellent on repeat by me at 132/60 supine and 138/64  standing.  Remotely he had experienced a pulmonary embolism following an episode of pneumonia and was followed closely by Dr. Algis Greenhouse who has since retired but it was advised that he continue anticoagulation indefinitely.  Most recently he has been on low-dose Eliquis at 2.5 mg twice a day in light of his age and renal function.  He has a history of GI reflux and continues to be on Nexium.  At times he has taken Klonopin for anxiety and has recently noticed some increase stress associated with moving from his long term dwelling with a large house In [place] to a smaller house in [place]. He denies any presyncope or syncope.  When I initially saw him he is unaware of any cardiac murmur.  He also had stage IIIb CKD.  I reviewed his  echo Doppler study which shows normal LV systolic function with EF at 60 to 65% with grade 2 diastolic dysfunction.  There is evidence for mild MR and moderate aortic valve stenosis with a mean gradient of 24 and peak gradient of 36 with estimated AVA at 1.2 cm.  Zio patch monitor demonstrates predominant sinus rhythm but he did have 1 run of nonsustained VT of 6 beats at an average rate of 103 (max 146.  There were 5 beats of SVT.  He also had numerous episodes of second-degree AV block felt to be Mobitz 2.  His shortness of breath may be contributed both by his grade 2 diastolic dysfunction as well as aortic valve stenosis.  He has right bundle branch block and previously noted first-degree AV block.  I reviewed his echo Doppler data as well as a Zio patch monitor.  I have recommended an EP evaluation and will schedule him to see Dr. Crissie Sickles.  In 6 months, I am scheduling him a follow-up echo Doppler study for reassessment of his aortic valve stenosis.  With his creatinine of 1.8, age of 44 years, he is on low-dose Eliquis anticoagulation at 2.5 mg twice a day.  Blood pressure today is stable without orthostatic change on amlodipine and losartan.  I will see him in  follow-up of his echo Doppler assessment in 6 months.   Medication Adjustments/Labs and Tests Ordered: Current medicines are reviewed at length with the patient today.  Concerns regarding medicines are outlined above.  Medication changes, Labs and Tests ordered today are listed in the Patient Instructions below. There are no Patient Instructions on file for this visit.   Signed, Roberto Majestic, MD  05/20/2022 3:59 PM    [facility] [address], [place], [place], [place]  [address] Phone: [phone]

## 2022-05-20 NOTE — Patient Instructions (Signed)
Medication Instructions:  The current medical regimen is effective;  continue present plan and medications as directed. Please refer to the Current Medication list given to you today.  *If you need a refill on your cardiac medications before your next appointment, please call your pharmacy*  Lab Work: NONE If you have labs (blood work) drawn today and your tests are completely normal, you will receive your results only by:  [place] (if you have MyChart) OR A paper copy in the mail If you have any lab test that is abnormal or we need to change your treatment, we will call you to review the results.  Testing/Procedures: Echocardiogram - (BEGINNING OF MAY)Your physician has requested that you have an echocardiogram. Echocardiography is a painless test that uses sound waves to create images of your heart. It provides your doctor with information about the size and shape of your heart and how well your heart's chambers and valves are working. This procedure takes approximately one hour. There are no restrictions for this procedure.    Follow-Up: At [facility], you and your health needs are our priority.  As part of our continuing mission to provide you with exceptional heart care, we have created designated Provider Care Teams.  These Care Teams include your primary Cardiologist (physician) and Advanced Practice Providers (APPs -  Physician Assistants and Nurse Practitioners) who all work together to provide you with the care you need, when you need it.  Your next appointment:   AFTER ECHO   The format for your next appointment:   In Person  Provider:   Shelva Majestic, MD     Other Instructions REFERRAL TO DR Lovena Le  Important Information About [place]

## 2022-05-22 ENCOUNTER — Other Ambulatory Visit: Payer: Self-pay | Admitting: Internal Medicine

## 2022-05-22 ENCOUNTER — Encounter: Payer: Self-pay | Admitting: Internal Medicine

## 2022-05-22 ENCOUNTER — Telehealth: Payer: Medicare Other | Admitting: Physician Assistant

## 2022-05-22 ENCOUNTER — Ambulatory Visit (INDEPENDENT_AMBULATORY_CARE_PROVIDER_SITE_OTHER): Payer: Medicare Other | Admitting: Internal Medicine

## 2022-05-22 VITALS — BP 112/64 | HR 58 | Temp 101.0°F

## 2022-05-22 DIAGNOSIS — M21961 Unspecified acquired deformity of right lower leg: Secondary | ICD-10-CM

## 2022-05-22 DIAGNOSIS — Z86711 Personal history of pulmonary embolism: Secondary | ICD-10-CM

## 2022-05-22 DIAGNOSIS — Z7901 Long term (current) use of anticoagulants: Secondary | ICD-10-CM | POA: Diagnosis not present

## 2022-05-22 DIAGNOSIS — S91114A Laceration without foreign body of right lesser toe(s) without damage to nail, initial encounter: Secondary | ICD-10-CM

## 2022-05-22 DIAGNOSIS — N1832 Chronic kidney disease, stage 3b: Secondary | ICD-10-CM | POA: Diagnosis not present

## 2022-05-22 DIAGNOSIS — L03119 Cellulitis of unspecified part of limb: Secondary | ICD-10-CM

## 2022-05-22 DIAGNOSIS — F5104 Psychophysiologic insomnia: Secondary | ICD-10-CM

## 2022-05-22 DIAGNOSIS — I10 Essential (primary) hypertension: Secondary | ICD-10-CM | POA: Diagnosis not present

## 2022-05-22 MED ORDER — MUPIROCIN CALCIUM 2 % EX CREA: 1.0000 | TOPICAL_CREAM | Freq: Two times a day (BID) | CUTANEOUS | 0 refills | Status: DC

## 2022-05-22 MED ORDER — LEVOFLOXACIN 500 MG PO TABS: 500.0000 mg | ORAL_TABLET | Freq: Every day | ORAL | 1 refills | Status: DC

## 2022-05-22 MED ORDER — CEFTRIAXONE SODIUM 1 G IJ SOLR
1.0000 g | Freq: Once | INTRAMUSCULAR | Status: AC
  Administered 2022-05-22: 1 g via INTRAMUSCULAR

## 2022-05-22 NOTE — Progress Notes (Signed)
No Charge, PCP answered

## 2022-05-22 NOTE — Progress Notes (Unsigned)
   Subjective:    Patient ID: Roberto Reid, male    DOB: 1932/10/02, 88 y.o.   MRN: [id number]  HPI Mr. Christiano is here accompanied by his wife today. He has a fever, malaise and fatigue. Wife noticed he had trimmed a callus from right second toe recently and feels he has an infection originating from that which is very likely.    Review of Systems     Objective:   Physical Exam        Assessment & Plan:

## 2022-05-23 LAB — CBC WITH DIFFERENTIAL/PLATELET
Absolute Monocytes: 172 cells/uL — ABNORMAL LOW (ref 200–950)
Basophils Absolute: 8 cells/uL (ref 0–200)
Basophils Relative: 0.2 %
Eosinophils Absolute: 0 cells/uL — ABNORMAL LOW (ref 15–500)
Eosinophils Relative: 0 %
HCT: 33.5 % — ABNORMAL LOW (ref 38.5–50.0)
Hemoglobin: 11.7 g/dL — ABNORMAL LOW (ref 13.2–17.1)
Lymphs Abs: 353 cells/uL — ABNORMAL LOW (ref 850–3900)
MCH: 34.1 pg — ABNORMAL HIGH (ref 27.0–33.0)
MCHC: 34.9 g/dL (ref 32.0–36.0)
MCV: 97.7 fL (ref 80.0–100.0)
MPV: 11.7 fL (ref 7.5–12.5)
Monocytes Relative: 4.2 %
Neutro Abs: 3567 cells/uL (ref 1500–7800)
Neutrophils Relative %: 87 %
Platelets: 118 10*3/uL — ABNORMAL LOW (ref 140–400)
RBC: 3.43 10*6/uL — ABNORMAL LOW (ref 4.20–5.80)
RDW: 12.6 % (ref 11.0–15.0)
Total Lymphocyte: 8.6 %
WBC: 4.1 10*3/uL (ref 3.8–10.8)

## 2022-05-23 LAB — COMPLETE METABOLIC PANEL WITH GFR
AG Ratio: 1.7 (calc) (ref 1.0–2.5)
ALT: 3 U/L — ABNORMAL LOW (ref 9–46)
AST: 10 U/L (ref 10–35)
Albumin: 4 g/dL (ref 3.6–5.1)
Alkaline phosphatase (APISO): 34 U/L — ABNORMAL LOW (ref 35–144)
BUN/Creatinine Ratio: 14 (calc) (ref 6–22)
BUN: 32 mg/dL — ABNORMAL HIGH (ref 7–25)
CO2: 24 mmol/L (ref 20–32)
Calcium: 8.8 mg/dL (ref 8.6–10.3)
Chloride: 103 mmol/L (ref 98–110)
Creat: 2.28 mg/dL — ABNORMAL HIGH (ref 0.70–1.22)
Globulin: 2.3 g/dL (calc) (ref 1.9–3.7)
Glucose, Bld: 104 mg/dL — ABNORMAL HIGH (ref 65–99)
Potassium: 4.4 mmol/L (ref 3.5–5.3)
Sodium: 138 mmol/L (ref 135–146)
Total Bilirubin: 2.4 mg/dL — ABNORMAL HIGH (ref 0.2–1.2)
Total Protein: 6.3 g/dL (ref 6.1–8.1)
eGFR: 27 mL/min/{1.73_m2} — ABNORMAL LOW (ref >=60)

## 2022-05-26 ENCOUNTER — Encounter: Payer: Self-pay | Admitting: Internal Medicine

## 2022-05-26 ENCOUNTER — Encounter: Payer: Self-pay | Admitting: Cardiovascular Disease

## 2022-05-26 NOTE — Patient Instructions (Addendum)
Rocephin 1 g IM given in office today and started on Levaquin 500 milligrams daily for 10 days.  He and his wife will monitor the infection in his toe.  We certainly can make him at the office tomorrow and give him another injection of Rocephin or if necessary he can be seen in the emergency department.  His white count is stable at 4100 currently.

## 2022-05-27 ENCOUNTER — Telehealth: Payer: Self-pay | Admitting: Internal Medicine

## 2022-05-27 DIAGNOSIS — G8929 Other chronic pain: Secondary | ICD-10-CM

## 2022-05-27 NOTE — Telephone Encounter (Signed)
Myking Sar [phone]  [name] called to say he will need refill on 05/29/2022 for below medication.   HYDROcodone-acetaminophen [facility]) 10-325 MG tablet   [facility] [id number] [place], [place] - [place] Phone: [phone]  Fax: [phone]

## 2022-05-28 ENCOUNTER — Encounter: Payer: Self-pay | Admitting: Internal Medicine

## 2022-05-28 MED ORDER — HYDROCODONE-ACETAMINOPHEN 10-325 MG PO TABS: ORAL_TABLET | ORAL | 0 refills | Status: DC

## 2022-05-28 NOTE — Telephone Encounter (Signed)
I have refilled this Rx one day early as he had an acute foot injury that was painful and I thought he might be running out before the [place] holiday. Also, sometimes we have to use different pharmacies depending on supply chain and it takes a bit of time to redo these Rxs. MJB, MD

## 2022-06-05 ENCOUNTER — Other Ambulatory Visit: Payer: Self-pay | Admitting: Internal Medicine

## 2022-06-09 ENCOUNTER — Ambulatory Visit: Payer: Medicare Other | Admitting: Internal Medicine

## 2022-06-15 ENCOUNTER — Encounter: Payer: Self-pay | Admitting: Internal Medicine

## 2022-06-15 ENCOUNTER — Ambulatory Visit (INDEPENDENT_AMBULATORY_CARE_PROVIDER_SITE_OTHER): Payer: Medicare Other | Admitting: Internal Medicine

## 2022-06-15 VITALS — BP 130/66 | HR 72 | Temp 98.0°F | Ht 70.0 in | Wt 170.8 lb

## 2022-06-15 DIAGNOSIS — Z7901 Long term (current) use of anticoagulants: Secondary | ICD-10-CM

## 2022-06-15 DIAGNOSIS — R52 Pain, unspecified: Secondary | ICD-10-CM | POA: Diagnosis not present

## 2022-06-15 DIAGNOSIS — E7439 Other disorders of intestinal carbohydrate absorption: Secondary | ICD-10-CM

## 2022-06-15 DIAGNOSIS — G8929 Other chronic pain: Secondary | ICD-10-CM

## 2022-06-15 DIAGNOSIS — N1832 Chronic kidney disease, stage 3b: Secondary | ICD-10-CM | POA: Diagnosis not present

## 2022-06-15 DIAGNOSIS — M79671 Pain in right foot: Secondary | ICD-10-CM

## 2022-06-15 DIAGNOSIS — Z86711 Personal history of pulmonary embolism: Secondary | ICD-10-CM

## 2022-06-15 DIAGNOSIS — M2141 Flat foot [pes planus] (acquired), right foot: Secondary | ICD-10-CM | POA: Diagnosis not present

## 2022-06-15 DIAGNOSIS — I10 Essential (primary) hypertension: Secondary | ICD-10-CM | POA: Diagnosis not present

## 2022-06-15 DIAGNOSIS — L03119 Cellulitis of unspecified part of limb: Secondary | ICD-10-CM

## 2022-06-15 NOTE — Patient Instructions (Addendum)
It was a pleasure to see you today.  Continue chronic pain medications as prescribed due to pain from foot deformity.  Urine drug screen obtained today.  Follow-up in 3 months.  Has appointment to see [facility] regarding consultation for possible pacemaker placement.

## 2022-06-15 NOTE — Progress Notes (Signed)
Subjective:    Patient ID: Roberto Reid, male    DOB: 1933/05/07, 89 y.o.   MRN: [id number]  HPI 89 year old Male seen today for follow-up on chronic pain.  He was here in October for Medicare wellness visit.  He takes his medications reliably and responsibly and calls for refills on time.  Current pain medications do not completely eliminate his pain but make the pain more bearable.  On December 22 he was seen here for a laceration and infection of second toe.  He was treated with Rocephin and oral antibiotics.  He has improved.  Lesion is on the plantar aspect of his foot and is still healing.  No evidence of secondary bacterial infection.  At the time he had temperature of 101 degrees.  His white count on December 22 was 4100.  He has mild hypertension.  He takes Seroquel and Ambien for sleep.  Has Klonopin if needed for anxiety.  He is on chronic anticoagulation with history of DVT.  For chronic foot pain he takes hydrocodone APAP 10/325 every 6-8 hours as needed.  History of mild glucose intolerance, GE reflux, hyperlipidemia, chronic insomnia and low testosterone.  He saw Dr. Para March at [facility] in 2017.  A horse stepped on his foot some 20 years ago.  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  He had first and second TMT attempted arthrodesis by Dr. Beola Cord in 2010.  This went on to a nonunion and he has required a pass planovalgus and hallux valgus foot deformities.  He has decreased sensation and circulation in his foot.  Orthotics have not been helpful.  He is on chronic pain medication for chronic foot pain.  Not as active as he used to be due to foot pain.  History of pulmonary embolus associated with left lower lobe pneumonia in 2017 treated with Eliquis.  Hematology has advised him to continue with chronic anticoagulation indefinitely.  Review of Systems no chest pain, shortness of breath, abdominal pain, edema of the lower extremities.  Is adjusting to new home in [place].   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     Objective:   Physical Exam  Blood pressure 130/66, pulse 72 regular, temperature 98 degrees by ear thermometer, pulse oximetry 98% weight 170 pounds 12.8 ounces,, height 5 feet 10 inches, BMI 24.51  Seen today in no acute distress.  He drove himself to the office.  Skin: Warm and dry.  No cervical adenopathy.  No carotid bruits.  Chest clear.  Cardiac exam: Regular rate and rhythm.  Right foot: Plantar aspect has healing lacerated area.  No drainage.  No lower extremity pitting edema.     Assessment & Plan:   Chronic pain management due to chronic left foot pain and deformity status post arthrodesis in 2010  Severe hallux valgus right foot status post  Acquired pes planovalgus right foot  Recently had episode of cellulitis right foot treated with Levaquin.  Is seen to be related to trimming of callus from right second toe.  No fall or trauma to the area.  Hypertension-stable  Mild glucose intolerance  GE reflux  Hyperlipidemia  History of first-degree AV block and right bundle branch block  History of grade 2 diastolic dysfunction  History of moderate aortic valve stenosis and mild mitral regurgitation  Foot laceration-improving  Plan: He was prescribed Levaquin 500 milligrams daily for 10 days on December 22.  The wound looks improved  Chronic foot pain requiring narcotic pain medication.  He takes  his medications for his foot pain reliably and responsibly.  He has regular follow-ups.  Urine drug screen obtained today.  Urine drug screen obtained today.  He has follow-up chronic pain medication appointments here on a regular basis.

## 2022-06-17 LAB — DRUG MONITORING, PANEL 8 WITH CONFIRMATION, URINE
6 Acetylmorphine: NEGATIVE ng/mL (ref <10)
Alcohol Metabolites: POSITIVE ng/mL — AB (ref <500)
Amphetamines: NEGATIVE ng/mL (ref <500)
Benzodiazepines: NEGATIVE ng/mL (ref <100)
Buprenorphine, Urine: NEGATIVE ng/mL (ref <5)
Cocaine Metabolite: NEGATIVE ng/mL (ref <150)
Codeine: NEGATIVE ng/mL (ref <50)
Creatinine: 114.9 mg/dL (ref >=20.0)
Ethyl Glucuronide (ETG): >10000 ng/mL — ABNORMAL HIGH (ref <500)
Ethyl Sulfate (ETS): 2059 ng/mL — ABNORMAL HIGH (ref <100)
Hydrocodone: 4010 ng/mL — ABNORMAL HIGH (ref <50)
Hydromorphone: 1257 ng/mL — ABNORMAL HIGH (ref <50)
MDMA: NEGATIVE ng/mL (ref <500)
Marijuana Metabolite: NEGATIVE ng/mL (ref <20)
Morphine: NEGATIVE ng/mL (ref <50)
Norhydrocodone: 3578 ng/mL — ABNORMAL HIGH (ref <50)
Opiates: POSITIVE ng/mL — AB (ref <100)
Oxidant: NEGATIVE ug/mL (ref <200)
Oxycodone: NEGATIVE ng/mL (ref <100)
pH: 5.2 (ref 4.5–9.0)

## 2022-06-17 LAB — DM TEMPLATE

## 2022-06-23 ENCOUNTER — Other Ambulatory Visit (HOSPITAL_COMMUNITY): Payer: Medicare Other

## 2022-06-25 ENCOUNTER — Telehealth: Payer: Self-pay | Admitting: Internal Medicine

## 2022-06-25 NOTE — Telephone Encounter (Signed)
[facility] is open 1-6 on Sunday

## 2022-06-25 NOTE — Telephone Encounter (Signed)
Roberto Reid [phone]  [name] called to say he was going to need his below medication on Sunday.  HYDROcodone-acetaminophen [facility]) 10-325 MG tablet    [facility] [id number] [place], [place] - [place] Phone: [phone]  Fax: [phone]

## 2022-06-26 MED ORDER — HYDROCODONE-ACETAMINOPHEN 10-325 MG PO TABS: ORAL_TABLET | ORAL | 0 refills | Status: DC

## 2022-06-26 NOTE — Telephone Encounter (Signed)
scheduled

## 2022-06-26 NOTE — Telephone Encounter (Signed)
[name] called to say that he spoke with a Merry Proud at the pharmacy and they only have 72 pills, so on Sunday they can only give him that many and he will have to get the next prescription sent in early with more called in to make up for what he is missing this time. They will have 90 on Monday but he said he will be completely out for 2-3 by then and can not wait. He also said that when he takes 4  a day that 90 pills is not enough to cover for a 30 day supply and with his foot he has been having to take that many.

## 2022-06-30 ENCOUNTER — Encounter: Payer: Self-pay | Admitting: Internal Medicine

## 2022-06-30 ENCOUNTER — Ambulatory Visit: Payer: Medicare Other | Attending: Internal Medicine | Admitting: Internal Medicine

## 2022-06-30 ENCOUNTER — Ambulatory Visit (INDEPENDENT_AMBULATORY_CARE_PROVIDER_SITE_OTHER): Payer: Medicare Other | Admitting: Internal Medicine

## 2022-06-30 VITALS — BP 116/80 | HR 60 | Temp 98.0°F | Ht 70.0 in | Wt 174.8 lb

## 2022-06-30 VITALS — BP 116/60 | HR 75 | Ht 70.0 in | Wt 171.0 lb

## 2022-06-30 DIAGNOSIS — I441 Atrioventricular block, second degree: Secondary | ICD-10-CM | POA: Insufficient documentation

## 2022-06-30 DIAGNOSIS — R52 Pain, unspecified: Secondary | ICD-10-CM

## 2022-06-30 DIAGNOSIS — R3915 Urgency of urination: Secondary | ICD-10-CM

## 2022-06-30 DIAGNOSIS — M21961 Unspecified acquired deformity of right lower leg: Secondary | ICD-10-CM

## 2022-06-30 DIAGNOSIS — I10 Essential (primary) hypertension: Secondary | ICD-10-CM

## 2022-06-30 MED ORDER — TAMSULOSIN HCL 0.4 MG PO CAPS: 0.4000 mg | ORAL_CAPSULE | Freq: Every day | ORAL | 3 refills | Status: DC

## 2022-06-30 NOTE — Patient Instructions (Addendum)
Medication Instructions:  Your physician recommends that you continue on your current medications as directed. Please refer to the Current Medication list given to you today.  *If you need a refill on your cardiac medications before your next appointment, please call your pharmacy*  Lab Work: None ordered.  If you have labs (blood work) drawn today and your tests are completely normal, you will receive your results only by: MyChart Message (if you have MyChart) OR A paper copy in the mail If you have any lab test that is abnormal or we need to change your treatment, we will call you to review the results.  Testing/Procedures: None ordered.  Follow-Up: At [facility], you and your health needs are our priority.  As part of our continuing mission to provide you with exceptional heart care, we have created designated Provider Care Teams.  These Care Teams include your primary Cardiologist (physician) and Advanced Practice Providers (APPs -  Physician Assistants and Nurse Practitioners) who all work together to provide you with the care you need, when you need it.  We recommend signing up for the patient portal called "MyChart".  Sign up information is provided on this After Visit Summary.  MyChart is used to connect with patients for Virtual Visits (Telemedicine).  Patients are able to view lab/test results, encounter notes, upcoming appointments, etc.  Non-urgent messages can be sent to your provider as well.   To learn more about what you can do with MyChart, go to [email or web address].    Your next appointment:   AS NEEDED   The format for your next appointment:   In Person  Provider:   Gregg Taylor, MD{or one of the following Advanced Practice Providers on your designated Care Team:   Renee Ursuy, PA-C Michael "Andy" Tillery, PA-C        

## 2022-06-30 NOTE — Progress Notes (Signed)
HPI Mr. Roberto Reid is referred by Dr. Georgina Peer for evaluation of conduction system disease. He is an 89 yo man who has been fairly healthy. He has longstanding RBBB and HTN. He wore a cardiac monitor demonstrating nocturnal AV block. He has not had syncope. He remains active. He denies limitation. No anginal symptoms. He denies peripheral edema.  Allergies  Allergen Reactions   Oxycodone-Acetaminophen Other (See Comments)    confusion   Percocet [Oxycodone-Acetaminophen]     Wild hallucinations. He states he has tolerated plain oxycodone in the past.    Sulfa Antibiotics    Sulfamethoxazole Rash     Current Outpatient Medications  Medication Sig Dispense Refill   amLODipine (NORVASC) 5 MG tablet TAKE ONE TABLET BY MOUTH DAILY 90 tablet 3   clonazePAM (KLONOPIN) 0.5 MG tablet TAKE 1 TO 2 TABLETS BY MOUTH DAILY AS NEEDED FOR ANXIETY 60 tablet 1   Coenzyme Q10 (CO Q-10 PO) Take 1 tablet by mouth daily. 300 mg     doxycycline (VIBRA-TABS) 100 MG tablet Take 1 tablet (100 mg total) by mouth 2 (two) times daily. 20 tablet 0   ELIQUIS 2.5 MG TABS tablet TAKE 1 TABLET BY MOUTH TWICE A DAY 180 tablet 0   esomeprazole (NEXIUM) 40 MG capsule Take 1 capsule (40 mg total) by mouth 2 (two) times daily. 60 capsule 11   HYDROcodone-acetaminophen (NORCO) 10-325 MG tablet One tab by mouth every 6-8 hours  for chronic pain 90 tablet 0   levofloxacin (LEVAQUIN) 500 MG tablet Take 1 tablet (500 mg total) by mouth daily. 10 tablet 1   loratadine (CLARITIN) 10 MG tablet Take 10 mg by mouth daily as needed for allergies.     losartan (COZAAR) 100 MG tablet TAKE ONE TABLET BY MOUTH DAILY 90 tablet 3   lubiprostone (AMITIZA) 24 MCG capsule Take 1 capsule (24 mcg total) by mouth 2 (two) times daily with a meal. 60 capsule 11   Multiple Vitamins-Minerals (MULTIVITAMIN WITH MINERALS) tablet Take 1 tablet by mouth daily.     mupirocin cream (BACTROBAN) 2 % Apply 1 Application topically 2 (two) times daily. 15 g 0    Omega-3 Fatty Acids (OMEGA 3 PO) Take by mouth.     ondansetron (ZOFRAN) 4 MG tablet TAKE ONE TABLET BY MOUTH EVERY 8 HOURS AS NEEDED FOR NAUSEA AND VOMITING 20 tablet 1   QUEtiapine (SEROQUEL) 50 MG tablet TAKE ONE TABLET BY MOUTH EVERY NIGHT AT BEDTIME 90 tablet 0   tamsulosin (FLOMAX) 0.4 MG CAPS capsule Take 1 capsule (0.4 mg total) by mouth daily. 30 capsule 3   Testosterone 20.25 MG/ACT (1.62%) GEL 20.25 mg by Pump Prime route daily. Apply 6 pumps everyday as directed.     zolpidem (AMBIEN) 10 MG tablet TAKE ONE TABLET BY MOUTH EVERY NIGHT AT BEDTIME 90 tablet 1   Current Facility-Administered Medications  Medication Dose Route Frequency Provider Last Rate Last Admin   mupirocin cream (BACTROBAN) 2 %   Topical Once Roberto Showers, MD         Past Medical History:  Diagnosis Date   Arthritis    "right hand" (06/15/2016)   Childhood asthma    Chronic anticoagulation 08/30/2017   DVT, lower extremity, proximal, acute, right ([place]) 08/24/2016   06/15/16   GERD (gastroesophageal reflux disease)    Hearing loss    History of kidney stones    Hyperlipidemia    Hypertension    Pneumonia    "I've had it ~  9 times as an adult; last time was 04/2016" (06/15/2016)    ROS:   All systems reviewed and negative except as noted in the HPI.   Past Surgical History:  Procedure Laterality Date   AMPUTATION TOE Right 09/27/2018   Procedure: Right 2nd toe amputation;  Surgeon: Wylene Simmer, MD;  Location: [facility];  Service: Orthopedics;  Laterality: Right;  25mn   CARPAL TUNNEL RELEASE Left 08/16/2020   Procedure: LEFT CARPAL TUNNEL RELEASE;  Surgeon: KLeanora Cover MD;  [place] [facility]  Service: Orthopedics;  Laterality: Left;   CATARACT EXTRACTION W/ INTRAOCULAR LENS  IMPLANT, BILATERAL Bilateral 04/2009   ELBOW BURSA SURGERY Right    FOOT SURGERY Right 2010   "attempted reconstruction" per pt   JOINT REPLACEMENT     KIDNEY STONE SURGERY     KNEE  ARTHROSCOPY Right 10/08/2014   Procedure: ARTHROSCOPY KNEE;  Surgeon: SVickey Huger MD;  [place] [facility]  Service: Orthopedics;  Laterality: Right;   KNEE ARTHROSCOPY Left    repair torn knee cartilage   QUADRICEPS TENDON REPAIR Right 10/08/2014   Procedure: REPAIR QUADRICEP TENDON;  Surgeon: SVickey Huger MD;  [place] [facility]  Service: Orthopedics;  Laterality: Right;   TONSILLECTOMY     TOTAL KNEE ARTHROPLASTY  12/14/2011   Procedure: TOTAL KNEE ARTHROPLASTY;  Surgeon: SRudean Haskell MD;  [place] [facility]  Service: Orthopedics;  Laterality: Left;     Family History  Problem Relation Age of Onset   Parkinson's disease Father      Social History   Socioeconomic History   Marital status: Married    Spouse name: Not on file   Number of children: 2   Years of education: plus some graduate work   Highest education level: Bachelor's degree (e.g., BA, AB, BS)  Occupational History   Not on file  Tobacco Use   Smoking status: Former    Packs/day: 0.75    Years: 37.00    Total pack years: 27.75    Types: Cigarettes    Quit date: 12/14/1991    Years since quitting: 30.5   Smokeless tobacco: Never  Vaping Use   Vaping Use: Never used  Substance and Sexual Activity   Alcohol use: Yes    Alcohol/week: 7.0 - 10.0 standard drinks of alcohol    Types: 7 - 10 Shots of liquor per week    Comment: 2 cocktails daily   Drug use: No   Sexual activity: Yes  Other Topics Concern   Not on file  Social History Narrative   Lives at home with wife   Right handed   Caffeine: 2 cups/day   Social Determinants of Health   Financial Resource Strain: Not on file  Food Insecurity: Not on file  Transportation Needs: Not on file  Physical Activity: Not on file  Stress: Not on file  Social Connections: Not on file  Intimate Partner Violence: Not on file     BP 116/60   Pulse 75   Ht '5\' 10"'$  (1.778 m)   Wt 171 lb (77.6 kg)   SpO2 98%   BMI 24.54 kg/m   Physical Exam:  Well appearing  NAD HEENT: Unremarkable Neck:  No JVD, no thyromegally Lymphatics:  No adenopathy Back:  No CVA tenderness Lungs:  Clear with no wheezes HEART:  Regular rate rhythm, no murmurs, no rubs, no clicks Abd:  soft, positive bowel sounds, no organomegally, no rebound, no guarding Ext:  2 plus pulses, no edema,  no cyanosis, no clubbing Skin:  No rashes no nodules Neuro:  CN II through XII intact, motor grossly intact  EKG - NSR with marked first degree AV block and RBBB  Assess/Plan: Conduction system disease - he has extensive conduction system disease but does not have advanced disease. His block is only nocturnal I discussed the symptoms he might experience if his symptoms worsen. He will undergo watchful waiting.   Carleene Overlie Ellarie Picking,MD

## 2022-06-30 NOTE — Progress Notes (Signed)
   Subjective:    Patient ID: Roberto Reid, male    DOB: 07/31/1932, 89 y.o.   MRN: [id number]  HPI Patient seen for chronic pain management follow up. He had drug template monitoring completed on January 15th. He takes his narcotic medication reliably. He recently had cellulitis of foot December 22nd after trimming a callus on his foot. He was treated with Im Rocephin x 1 and  oral antibiotics. The foot was more painful than his usual chronic foot pain due to infection and inflammation. He had more foot pain at that time and was taking up to 4 Hydrocodone APAP tabs per 24 hours in steadof 3 tabs. As a result, he had run out of narcotic pain med early due to that. His foot is better. Discussion today about pharmacies having Hydrocodone shortage. Sometimes he takes an extra tab if having a lot of pain. Does not want to see foot specialist at this time. Getting settled into new home. Wife has returned to work for an Insurance underwriter.She  works mostly from home but that has been an adjustment for the patient. Sometimes he is alone and also has basically given up playing golf due to foot issue. Not a lot of close friends that he spends time with. Perhaps he can find a couple of retired men in the [place] area to have lunch with or share some interest such as football.  He has appt to see Electrophysiologist, Dr. Lovena Le, later today  Review of Systems he is having some issues with urge urinary incontinence which are new.  He has a history of BPH and has seen Dr. Sandrea Matte in the remote past at [facility].  I am going to prescribe Flomax for him and if not improving within a couple of weeks we can [facility] back to [facility]     Objective:   Physical Exam VS reviewed. BP 116/80 pulse 60 T 98 degrees pulse ox 95% weight 174 lb 12.8 oz BMI 25.08   He is seen in no acute distress. No change in right foot deformity. No open lesions or drainage of  right foot  He is also having some urinary urgency and has  had a couple of episodes of incontinence.  Likely has BPH and has been seen at [facility] in the past.  I am prescribing Flomax for him to try and if not improving in a couple of weeks he can be seen at [facility] with Dr. Helane Rima.  His urine dipstick in October was normal    Assessment & Plan:   Chronic pain management with hydrocodone APAP.  Has had issues getting quantities of hydrocodone APAP due to shortages at pharmacies.  Has occasionally had to take 4 hydrocodone APAP 10/325 daily when having issues with foot infection but I would prefer that he stay with 3 a day if possible.  We discussed seeing orthopedist once again and he declines.  Chronic foot deformity-does not want to see orthopedist at this time  Urinary urgency-trial of Flomax  Recent move to new home and situational stress discussed  Time spent with patient was 40 minutes discussing his current situation, chronic foot pain and other medical issues.

## 2022-07-01 NOTE — Patient Instructions (Addendum)
Try Flomax for urinary urgency.  Discussion regarding chronic pain management.  He is generally seen every 3 months for chronic pain follow-up.  He takes his medication reliably and responsibly and has regular urine drug screens.

## 2022-07-23 ENCOUNTER — Telehealth: Payer: Self-pay | Admitting: Internal Medicine

## 2022-07-23 NOTE — Telephone Encounter (Signed)
Roberto Reid called to say he will need a refill on his below medication on Sunday 07/26/2022 and he was able to get the full amount last time because someone did not pick theres up. He also was going to call and make sure they had plenty in stock and if he did not call me back they were good. I have not heard from him.  HYDROcodone-acetaminophen [facility]) 10-325 MG tablet   [facility] [id number] [place], [place] - [place] Phone: [phone]  Fax: [phone]

## 2022-07-24 ENCOUNTER — Encounter: Payer: Self-pay | Admitting: Internal Medicine

## 2022-07-24 ENCOUNTER — Telehealth: Payer: Self-pay | Admitting: Internal Medicine

## 2022-07-24 MED ORDER — HYDROCODONE-ACETAMINOPHEN 10-325 MG PO TABS: ORAL_TABLET | ORAL | 0 refills | Status: DC

## 2022-07-24 NOTE — Telephone Encounter (Signed)
Refill on Norco is due this Sunday. I have sent Rx in today since it will be due when office is closed for the weekend. MJB

## 2022-08-02 ENCOUNTER — Other Ambulatory Visit: Payer: Self-pay | Admitting: Internal Medicine

## 2022-08-11 ENCOUNTER — Other Ambulatory Visit: Payer: Self-pay | Admitting: Urology

## 2022-08-14 DIAGNOSIS — E291 Testicular hypofunction: Secondary | ICD-10-CM | POA: Diagnosis not present

## 2022-08-18 ENCOUNTER — Telehealth: Payer: Self-pay | Admitting: Internal Medicine

## 2022-08-18 DIAGNOSIS — C44722 Squamous cell carcinoma of skin of right lower limb, including hip: Secondary | ICD-10-CM | POA: Diagnosis not present

## 2022-08-18 DIAGNOSIS — C44712 Basal cell carcinoma of skin of right lower limb, including hip: Secondary | ICD-10-CM | POA: Diagnosis not present

## 2022-08-18 NOTE — Telephone Encounter (Signed)
Roberto Reid [phone]  [name] called to say he will be out of his below medication on Saturday and would like it sent in on Friday. He also said that [facility] called to say his H&H labs off some and he is thinking that might be why he is cold all the time. He ask me to mail his labs from them. I have done that.  HYDROcodone-acetaminophen [facility]) 10-325 MG tablet   [facility] [id number] [place], [place] - [place] Phone: [phone]  Fax: [phone]

## 2022-08-21 ENCOUNTER — Telehealth: Payer: Self-pay | Admitting: Internal Medicine

## 2022-08-21 MED ORDER — HYDROCODONE-ACETAMINOPHEN 10-325 MG PO TABS: ORAL_TABLET | ORAL | 0 refills | Status: DC

## 2022-08-21 NOTE — Telephone Encounter (Signed)
Norco  10/325 Rx sent in today as requested by patient. MJB, MD

## 2022-08-30 ENCOUNTER — Other Ambulatory Visit: Payer: Self-pay | Admitting: Internal Medicine

## 2022-09-01 ENCOUNTER — Telehealth: Payer: Self-pay | Admitting: Internal Medicine

## 2022-09-01 NOTE — Telephone Encounter (Signed)
Roberto Reid [phone]  Roberto Reid called to say for the last 3 weeks he has had explosive diarrhea , stomach cramps, feels really bad, no energy and just is not getting better, so he felt like he needed to call.

## 2022-09-01 NOTE — Telephone Encounter (Signed)
Patient picked up stool study and was given instructions on how to use.

## 2022-09-01 NOTE — Telephone Encounter (Signed)
Called [name] back to schedule appointment for Thursday, also let him know he needed someone to pick up stool sample lab kit, and he would be needing labs. He verbalized understanding.

## 2022-09-03 ENCOUNTER — Encounter: Payer: Self-pay | Admitting: Internal Medicine

## 2022-09-03 ENCOUNTER — Ambulatory Visit (INDEPENDENT_AMBULATORY_CARE_PROVIDER_SITE_OTHER): Payer: Medicare Other | Admitting: Internal Medicine

## 2022-09-03 ENCOUNTER — Other Ambulatory Visit: Payer: Medicare Other

## 2022-09-03 VITALS — BP 122/66 | HR 90 | Temp 97.9°F | Ht 70.0 in | Wt 164.8 lb

## 2022-09-03 DIAGNOSIS — R197 Diarrhea, unspecified: Secondary | ICD-10-CM | POA: Diagnosis not present

## 2022-09-03 DIAGNOSIS — R630 Anorexia: Secondary | ICD-10-CM | POA: Diagnosis not present

## 2022-09-03 DIAGNOSIS — R634 Abnormal weight loss: Secondary | ICD-10-CM | POA: Diagnosis not present

## 2022-09-03 NOTE — Patient Instructions (Signed)
Labs drawn and are pending. Suggest KUB to see if impaction from chronic narcotic therapy. Tried to obtain stool samples and pt could not produce enough stool to send to lab for several specimen bottles. At least, we will try to get stool for C diff with history of antibiotic treatment.

## 2022-09-03 NOTE — Progress Notes (Signed)
Patient Care Team: Elby Showers, MD as PCP - General (Internal Medicine) Troy Sine, MD as PCP - Cardiology (Cardiology) Annia Belt, MD as Consulting Physician (Oncology) Madelon Lips, MD as Consulting Physician (Nephrology)  Visit Date: 09/03/22  Subjective:    Patient ID: Roberto Reid , Male   DOB: 08-10-1932, 89 y.o.    MRN: [id number]   89 y.o. Male presents today for diarrhea, bowel urgency, stomach cramps, fatigue since 07/31/22. Believes it may have something to do with switching from hydrocodone APAP 4 times daily to 3 times daily in February. Has not traveled recently. Has not eaten anything unusual. Has diminished appetite. He is sleeping more often than usual. As of yesterday, bowel movements dropped from 4 per day to 2-3 per day.  Patient was seen by Dr. Carlean Purl in January 2008 for surveillance exam.  Surveillance exam showed sessile polyp 3 mm in the ascending colon and a 6 mm polyp in the transverse colon and a 10 mm sessile polyp in the hepatic flexure.  Grade 1 internal and external hemorrhoids.  Patient had a polypectomy in 2002.  He had a polypectomy at that time for adenomatous polyp.  Transverse colon polyps were adenomatous as were ascending and hepatic flexure polyps.  He also had on endoscopy a stomach biopsy for showing benign gastric mucosa with mild chronic inflammation and no metaplasia.  He also had intestinal metaplasia in the esophagus consistent with Barrett's esophagus.  He recently had a wide excision of a right lower leg lesion on March 19 that turned out to be a well-differentiated squamous cell carcinoma by Dr. Rozann Lesches.  He was placed on Keflex 500 mg twice daily for 10 days at that time.  He had a laceration of his second toe right foot December 2023 and was placed on Levaquin 500 milligrams daily for 10 days and given 1 g IM Rocephin in the office.  No other recent antibiotic treatment.  No recent travel history.  Has not eaten any  unusual foods.  He reports weight loss and decreased appetite.  In October 2023 he weighed 173 pounds.  On June 15, 2022 he weighed 170 pounds 12.8 ounces.  He was seen for chronic pain management follow-up on June 30, 2022 and at that time weighed 174 pounds 12.8 ounces.  He saw Dr. Lovena Le June 30, 2022 and weighed 171 pounds.  Today he weighs 164 pounds 12.8 ounces.  He denies fever or shaking chills.  We had given him containers to collect stool for stool studies but he says that he can only partially filled these containers and lab tech tells me that this cannot be sent to the lab because they are only partially filled.  He says he is not producing that much stool at this time.  Denies bright red blood per rectum or melena.  We did give him a container today to try to get stool for C. difficile since he has been on 2 antibiotics i.e. Levaquin in late December and more recently Keflex on March 19 per Dr. Rozann Lesches, Dermatologist.  He is on chronic hydrocodone APAP for a longstanding foot deformity that is not amenable to surgical correction.  He has been seen by several specialist regarding this.  He is on Eliquis 2.5 mg daily for remote history of DVT.  Reported in October 2023 that he did not get his hungry and see previously did.  He was at that time trying to sell his home and [place] and  moved to a home in [place].  I think this move has been stressful.  Wife is now working from home for an airline as a Building control surveyor.  History of GE reflux, mild glucose intolerance, hyperlipidemia, chronic insomnia.  Barrett's esophagus and adenomatous colon polyps diagnosed 2008.  Right kidney stone in 1997 and in 2007.  History of hearing loss.  Left lower lobe pneumonia in 2017 and was hospitalized and subsequently found to have pulmonary embolus treated with Eliquis.  Hematologist advised him to continue with anticoagulation indefinitely.  Had left knee arthroplasty in 2013.  He has history of  mild hypertension.  Takes Klonopin and Seroquel as well as Ambien.   Past Medical History:  Diagnosis Date   Arthritis    "right hand" (06/15/2016)   Childhood asthma    Chronic anticoagulation 08/30/2017   DVT, lower extremity, proximal, acute, right 08/24/2016   06/15/16   GERD (gastroesophageal reflux disease)    Hearing loss    History of kidney stones    Hyperlipidemia    Hypertension    Pneumonia    "I've had it ~ 9 times as an adult; last time was 04/2016" (06/15/2016)     Family History  Problem Relation Age of Onset   Parkinson's disease Father     Social History   Social History Narrative   Lives at home with wife   Right handed   Caffeine: 2 cups/day      Review of Systems  Constitutional:  Positive for malaise/fatigue. Negative for fever.       (+) Diminished appetite  HENT:  Negative for congestion.   Eyes:  Negative for blurred vision.  Respiratory:  Negative for cough and shortness of breath.   Cardiovascular:  Negative for chest pain, palpitations and leg swelling.  Gastrointestinal:  Positive for abdominal pain (Cramping) and diarrhea. Negative for vomiting.  Musculoskeletal:  Negative for back pain.  Skin:  Negative for rash.  Neurological:  Negative for loss of consciousness and headaches.        Objective:   Vitals: BP 122/66   Pulse 90   Temp 97.9 F (36.6 C) (Tympanic)   Ht 5\' 10"  (1.778 m)   Wt 164 lb 12.8 oz (74.8 kg)   SpO2 96%   BMI 23.65 kg/m    Physical Exam Vitals and nursing note reviewed.  Constitutional:      General: He is not in acute distress.    Appearance: Normal appearance. He is not ill-appearing.  HENT:     Head: Normocephalic and atraumatic.  Pulmonary:     Effort: Pulmonary effort is normal.  Skin:    General: Skin is warm and dry.  Neurological:     Mental Status: He is alert and oriented to person, place, and time. Mental status is at baseline.  Psychiatric:        Mood and Affect: Mood normal.         Behavior: Behavior normal.        Thought Content: Thought content normal.        Judgment: Judgment normal.       Results:   Studies obtained and personally reviewed by me:   Labs:       Component Value Date/Time   NA 138 05/22/2022 1145   NA 138 08/23/2017 1101   K 4.4 05/22/2022 1145   CL 103 05/22/2022 1145   CO2 24 05/22/2022 1145   GLUCOSE 104 (H) 05/22/2022 1145   BUN 32 (H) 05/22/2022  1145   BUN 34 (H) 08/23/2017 1101   CREATININE 2.28 (H) 05/22/2022 1145   CALCIUM 8.8 05/22/2022 1145   PROT 6.3 05/22/2022 1145   ALBUMIN 3.5 08/24/2016 1519   AST 10 05/22/2022 1145   ALT 3 (L) 05/22/2022 1145   ALKPHOS 33 (L) 08/24/2016 1519   BILITOT 2.4 (H) 05/22/2022 1145   GFRNONAA 44 (L) 05/07/2020 1635   GFRAA 51 (L) 05/07/2020 1635     Lab Results  Component Value Date   WBC 4.1 05/22/2022   HGB 11.7 (L) 05/22/2022   HCT 33.5 (L) 05/22/2022   MCV 97.7 05/22/2022   PLT 118 (L) 05/22/2022    Lab Results  Component Value Date   CHOL 140 03/03/2022   HDL 46 03/03/2022   LDLCALC 78 03/03/2022   TRIG 75 03/03/2022   CHOLHDL 3.0 03/03/2022    Lab Results  Component Value Date   HGBA1C 5.3 03/03/2022     Lab Results  Component Value Date   TSH 3.79 04/07/2022     Lab Results  Component Value Date   PSA 0.42 03/03/2022   PSA 0.44 02/21/2021   PSA 0.45 02/19/2020      Assessment & Plan:   Diarrhea, abdominal pain: Ordered CBC with Diff/Plat, electrolytes, sed rate. Referral to Gastroenterologist.   Patient to bring stool sample for C. Diff  toxin. We considered other stool studies and gave him containers but he says he could not fill these up as he did not have sufficient stool.   Consider possible ? Constipation/Obstipation with ? Diarrhea around an impaction due to  chronic narcotic therapy. Suggest KUB  May be best served by GI consult.    I,Alexander Ruley,acting as a Education administrator for Elby Showers, MD.,have documented all relevant documentation on  the behalf of Elby Showers, MD,as directed by  Elby Showers, MD while in the presence of Elby Showers, MD.   I, Elby Showers, MD, have reviewed all documentation for this visit. The documentation on 09/03/22 for the exam, diagnosis, procedures, and orders are all accurate and complete.

## 2022-09-04 ENCOUNTER — Telehealth: Payer: Self-pay

## 2022-09-04 ENCOUNTER — Other Ambulatory Visit: Payer: Self-pay

## 2022-09-04 DIAGNOSIS — R197 Diarrhea, unspecified: Secondary | ICD-10-CM | POA: Diagnosis not present

## 2022-09-04 LAB — COMPLETE METABOLIC PANEL WITH GFR
AG Ratio: 1.7 (calc) (ref 1.0–2.5)
ALT: 6 U/L — ABNORMAL LOW (ref 9–46)
AST: 9 U/L — ABNORMAL LOW (ref 10–35)
Albumin: 4 g/dL (ref 3.6–5.1)
Alkaline phosphatase (APISO): 37 U/L (ref 35–144)
BUN/Creatinine Ratio: 20 (calc) (ref 6–22)
BUN: 34 mg/dL — ABNORMAL HIGH (ref 7–25)
CO2: 25 mmol/L (ref 20–32)
Calcium: 8.9 mg/dL (ref 8.6–10.3)
Chloride: 105 mmol/L (ref 98–110)
Creat: 1.74 mg/dL — ABNORMAL HIGH (ref 0.70–1.22)
Globulin: 2.4 g/dL (calc) (ref 1.9–3.7)
Glucose, Bld: 93 mg/dL (ref 65–99)
Potassium: 4.6 mmol/L (ref 3.5–5.3)
Sodium: 139 mmol/L (ref 135–146)
Total Bilirubin: 1.1 mg/dL (ref 0.2–1.2)
Total Protein: 6.4 g/dL (ref 6.1–8.1)
eGFR: 37 mL/min/{1.73_m2} — ABNORMAL LOW (ref >=60)

## 2022-09-04 LAB — CBC WITH DIFFERENTIAL/PLATELET
Absolute Monocytes: 473 cells/uL (ref 200–950)
Basophils Absolute: 31 cells/uL (ref 0–200)
Basophils Relative: 0.6 %
Eosinophils Absolute: 42 cells/uL (ref 15–500)
Eosinophils Relative: 0.8 %
HCT: 35.5 % — ABNORMAL LOW (ref 38.5–50.0)
Hemoglobin: 12 g/dL — ABNORMAL LOW (ref 13.2–17.1)
Lymphs Abs: 1066 cells/uL (ref 850–3900)
MCH: 33.2 pg — ABNORMAL HIGH (ref 27.0–33.0)
MCHC: 33.8 g/dL (ref 32.0–36.0)
MCV: 98.3 fL (ref 80.0–100.0)
MPV: 11.7 fL (ref 7.5–12.5)
Monocytes Relative: 9.1 %
Neutro Abs: 3588 cells/uL (ref 1500–7800)
Neutrophils Relative %: 69 %
Platelets: 210 10*3/uL (ref 140–400)
RBC: 3.61 10*6/uL — ABNORMAL LOW (ref 4.20–5.80)
RDW: 12.9 % (ref 11.0–15.0)
Total Lymphocyte: 20.5 %
WBC: 5.2 10*3/uL (ref 3.8–10.8)

## 2022-09-04 LAB — SEDIMENTATION RATE: Sed Rate: 31 mm/h — ABNORMAL HIGH (ref 0–20)

## 2022-09-04 NOTE — Telephone Encounter (Signed)
Patient informed Dr. Leone Payor office is trying to get in touch with him and patient will call the office back.  Also stated symptoms are getting better

## 2022-09-07 ENCOUNTER — Ambulatory Visit
Admission: RE | Admit: 2022-09-07 | Discharge: 2022-09-07 | Disposition: A | Discharge status: Home / Self Care | Payer: Medicare Other | Source: Ambulatory Visit | Attending: Internal Medicine | Admitting: Internal Medicine

## 2022-09-07 DIAGNOSIS — R197 Diarrhea, unspecified: Secondary | ICD-10-CM | POA: Diagnosis not present

## 2022-09-07 DIAGNOSIS — M5136 Other intervertebral disc degeneration, lumbar region: Secondary | ICD-10-CM | POA: Diagnosis not present

## 2022-09-07 DIAGNOSIS — R634 Abnormal weight loss: Secondary | ICD-10-CM | POA: Diagnosis not present

## 2022-09-07 DIAGNOSIS — E291 Testicular hypofunction: Secondary | ICD-10-CM | POA: Diagnosis not present

## 2022-09-08 NOTE — Progress Notes (Signed)
Patient Care Team: Margaree Mackintosh, MD as PCP - General (Internal Medicine) Lennette Bihari, MD as PCP - Cardiology (Cardiology) Levert Feinstein, MD as Consulting Physician (Oncology) Bufford Buttner, MD as Consulting Physician (Nephrology)  Visit Date: 09/15/22  Subjective:    Patient ID: Roberto Reid , Male   DOB: 1932-11-18, 89 y.o.    MRN: [id number]   89 y.o. Male presents today for a 3 month follow-up. Patient has a past medical history of arthritis, chronic anticoagulation, DVT, GERD, hearing loss, kidney stones, hyperlipidemia, hypertension, pneumonia.  Seen here for diarrhea, abdominal pain on 09/03/22. This has resolved. Having formed stools twice daily on average. Using Miralax.  History of hypertension treated with amlodipine 5 mg daily, losartan 100 mg daily. Blood pressure normal at 126/64.  History of musculoskeletal pain treated with hydrocodone-acetaminophen 10-325 mg every 6-8 hours as needed. This has been helping to keep him active.  History of GERD treated with esomeprazole 40 mg twice daily.   History of DVT treated with Eliquis 2.5 mg twice daily.   Past Medical History:  Diagnosis Date   Arthritis    "right hand" (06/15/2016)   Childhood asthma    Chronic anticoagulation 08/30/2017   DVT, lower extremity, proximal, acute, right 08/24/2016   06/15/16   GERD (gastroesophageal reflux disease)    Hearing loss    History of kidney stones    Hyperlipidemia    Hypertension    Pneumonia    "I've had it ~ 9 times as an adult; last time was 04/2016" (06/15/2016)     Family History  Problem Relation Age of Onset   Parkinson's disease Father     Social History   Social History Narrative   Lives at home with wife   Right handed   Caffeine: 2 cups/day      Review of Systems  Constitutional:  Negative for fever and malaise/fatigue.  HENT:  Negative for congestion.   Eyes:  Negative for blurred vision.  Respiratory:  Negative for cough and  shortness of breath.   Cardiovascular:  Negative for chest pain, palpitations and leg swelling.  Gastrointestinal:  Negative for vomiting.  Musculoskeletal:  Negative for back pain.  Skin:  Negative for rash.  Neurological:  Negative for loss of consciousness and headaches.        Objective:   Vitals: BP 126/64   Pulse 74   Temp 97.9 F (36.6 C) (Tympanic)   Ht 5\' 10"  (1.778 m)   Wt 164 lb 12.8 oz (74.8 kg)   SpO2 98%   BMI 23.65 kg/m    Physical Exam Vitals and nursing note reviewed.  Constitutional:      General: He is not in acute distress.    Appearance: Normal appearance. He is not ill-appearing.  HENT:     Head: Normocephalic and atraumatic.  Pulmonary:     Effort: Pulmonary effort is normal.  Skin:    General: Skin is warm and dry.  Neurological:     Mental Status: He is alert and oriented to person, place, and time. Mental status is at baseline.  Psychiatric:        Mood and Affect: Mood normal.        Behavior: Behavior normal.        Thought Content: Thought content normal.        Judgment: Judgment normal.       Results:   Studies obtained and personally reviewed by me:    Labs:  Component Value Date/Time   NA 139 09/03/2022 1147   NA 138 08/23/2017 1101   K 4.6 09/03/2022 1147   CL 105 09/03/2022 1147   CO2 25 09/03/2022 1147   GLUCOSE 93 09/03/2022 1147   BUN 34 (H) 09/03/2022 1147   BUN 34 (H) 08/23/2017 1101   CREATININE 1.74 (H) 09/03/2022 1147   CALCIUM 8.9 09/03/2022 1147   PROT 6.4 09/03/2022 1147   ALBUMIN 3.5 08/24/2016 1519   AST 9 (L) 09/03/2022 1147   ALT 6 (L) 09/03/2022 1147   ALKPHOS 33 (L) 08/24/2016 1519   BILITOT 1.1 09/03/2022 1147   GFRNONAA 44 (L) 05/07/2020 1635   GFRAA 51 (L) 05/07/2020 1635     Lab Results  Component Value Date   WBC 5.2 09/03/2022   HGB 12.0 (L) 09/03/2022   HCT 35.5 (L) 09/03/2022   MCV 98.3 09/03/2022   PLT 210 09/03/2022    Lab Results  Component Value Date   CHOL 140  03/03/2022   HDL 46 03/03/2022   LDLCALC 78 03/03/2022   TRIG 75 03/03/2022   CHOLHDL 3.0 03/03/2022    Lab Results  Component Value Date   HGBA1C 5.3 03/03/2022     Lab Results  Component Value Date   TSH 3.79 04/07/2022     Lab Results  Component Value Date   PSA 0.42 03/03/2022   PSA 0.44 02/21/2021   PSA 0.45 02/19/2020      Assessment & Plan:   Hypertension: treated with amlodipine 5 mg daily, losartan 100 mg daily. Blood pressure normal at 126/64.  Musculoskeletal pain: treated with hydrocodone-acetaminophen 10-325 mg every 6-8 hours as needed. This has been helping to keep him active.  GERD: stable with esomeprazole 40 mg twice daily.   DVT: treated with Eliquis 2.5 mg twice daily.    I,Alexander Ruley,acting as a Neurosurgeon for Margaree Mackintosh, MD.,have documented all relevant documentation on the behalf of Margaree Mackintosh, MD,as directed by  Margaree Mackintosh, MD while in the presence of Margaree Mackintosh, MD.   ***

## 2022-09-10 ENCOUNTER — Other Ambulatory Visit: Payer: Self-pay | Admitting: Internal Medicine

## 2022-09-10 LAB — C.DIFF TOXINB QL PCR: CLOSTRIDIUM DIFFICILE TOXINB,QL REAL TIME PCR: DETECTED — CR

## 2022-09-10 LAB — CLOSTRIDIUM DIFFICILE CULTURE-FECAL

## 2022-09-11 ENCOUNTER — Telehealth: Payer: Self-pay | Admitting: Internal Medicine

## 2022-09-11 DIAGNOSIS — A0472 Enterocolitis due to Clostridium difficile, not specified as recurrent: Secondary | ICD-10-CM

## 2022-09-11 MED ORDER — VANCOMYCIN HCL 125 MG PO CAPS: 125.0000 mg | ORAL_CAPSULE | Freq: Four times a day (QID) | ORAL | 0 refills | Status: DC

## 2022-09-11 MED ORDER — VANCOMYCIN HCL 125 MG PO CAPS: 125.0000 mg | ORAL_CAPSULE | Freq: Four times a day (QID) | ORAL | 0 refills | Status: AC

## 2022-09-11 NOTE — Telephone Encounter (Signed)
[name] just called back and said that [facility] is out of this medication and he needs it sent into the [facility].   Vancomycin 125mg  4 times daily x 10 days   [id number] [place], [place] - [address] AT [facility] [address] Phone: [phone]  Fax: [phone]

## 2022-09-11 NOTE — Telephone Encounter (Signed)
Spoke with patient and he is aware of message left by Dr. Lenord Fellers and will pick up Rx today.

## 2022-09-11 NOTE — Telephone Encounter (Signed)
Has been contacted by voice mail and staff will follow up. He has positive stool for C. Diff toxin. Will treat with Vancomycin 125mg  4 times daily x 10 days. Must be treated with liquid. MJB,MD

## 2022-09-11 NOTE — Addendum Note (Signed)
Addended by: Mary Sella D on: 09/11/2022 11:15 AM   Modules accepted: Orders

## 2022-09-15 ENCOUNTER — Other Ambulatory Visit: Payer: Medicare Other

## 2022-09-15 ENCOUNTER — Encounter: Payer: Self-pay | Admitting: Internal Medicine

## 2022-09-15 ENCOUNTER — Ambulatory Visit (INDEPENDENT_AMBULATORY_CARE_PROVIDER_SITE_OTHER): Payer: Medicare Other | Admitting: Internal Medicine

## 2022-09-15 VITALS — BP 126/64 | HR 74 | Temp 97.9°F | Ht 70.0 in | Wt 164.8 lb

## 2022-09-15 DIAGNOSIS — M79671 Pain in right foot: Secondary | ICD-10-CM

## 2022-09-15 DIAGNOSIS — Z86711 Personal history of pulmonary embolism: Secondary | ICD-10-CM

## 2022-09-15 DIAGNOSIS — Z7901 Long term (current) use of anticoagulants: Secondary | ICD-10-CM | POA: Diagnosis not present

## 2022-09-15 DIAGNOSIS — I10 Essential (primary) hypertension: Secondary | ICD-10-CM | POA: Diagnosis not present

## 2022-09-15 DIAGNOSIS — A0472 Enterocolitis due to Clostridium difficile, not specified as recurrent: Secondary | ICD-10-CM

## 2022-09-15 DIAGNOSIS — E538 Deficiency of other specified B group vitamins: Secondary | ICD-10-CM

## 2022-09-15 DIAGNOSIS — Z85828 Personal history of other malignant neoplasm of skin: Secondary | ICD-10-CM | POA: Diagnosis not present

## 2022-09-15 DIAGNOSIS — R52 Pain, unspecified: Secondary | ICD-10-CM | POA: Diagnosis not present

## 2022-09-15 DIAGNOSIS — N1832 Chronic kidney disease, stage 3b: Secondary | ICD-10-CM

## 2022-09-15 DIAGNOSIS — D649 Anemia, unspecified: Secondary | ICD-10-CM

## 2022-09-15 DIAGNOSIS — G8929 Other chronic pain: Secondary | ICD-10-CM

## 2022-09-15 DIAGNOSIS — Z08 Encounter for follow-up examination after completed treatment for malignant neoplasm: Secondary | ICD-10-CM | POA: Diagnosis not present

## 2022-09-15 DIAGNOSIS — M21961 Unspecified acquired deformity of right lower leg: Secondary | ICD-10-CM

## 2022-09-16 LAB — CBC WITH DIFFERENTIAL/PLATELET
Absolute Monocytes: 340 cells/uL (ref 200–950)
Basophils Absolute: 19 cells/uL (ref 0–200)
Basophils Relative: 0.3 %
Eosinophils Absolute: 113 cells/uL (ref 15–500)
Eosinophils Relative: 1.8 %
HCT: 34.4 % — ABNORMAL LOW (ref 38.5–50.0)
Hemoglobin: 11.6 g/dL — ABNORMAL LOW (ref 13.2–17.1)
Lymphs Abs: 1405 cells/uL (ref 850–3900)
MCH: 33 pg (ref 27.0–33.0)
MCHC: 33.7 g/dL (ref 32.0–36.0)
MCV: 98 fL (ref 80.0–100.0)
MPV: 10.3 fL (ref 7.5–12.5)
Monocytes Relative: 5.4 %
Neutro Abs: 4423 cells/uL (ref 1500–7800)
Neutrophils Relative %: 70.2 %
Platelets: 220 10*3/uL (ref 140–400)
RBC: 3.51 10*6/uL — ABNORMAL LOW (ref 4.20–5.80)
RDW: 12.9 % (ref 11.0–15.0)
Total Lymphocyte: 22.3 %
WBC: 6.3 10*3/uL (ref 3.8–10.8)

## 2022-09-16 LAB — IRON, TOTAL/TOTAL IRON BINDING CAP
%SAT: 53 % (calc) — ABNORMAL HIGH (ref 20–48)
Iron: 147 ug/dL (ref 50–180)
TIBC: 278 mcg/dL (calc) (ref 250–425)

## 2022-09-16 LAB — B12 AND FOLATE PANEL
Folate: 2.9 ng/mL — ABNORMAL LOW
Vitamin B-12: 747 pg/mL (ref 200–1100)

## 2022-09-16 LAB — RETICULOCYTES
ABS Retic: 38610 cells/uL (ref 25000–90000)
Retic Ct Pct: 1.1 %

## 2022-09-16 MED ORDER — FOLIC ACID 1 MG PO TABS
1.0000 mg | ORAL_TABLET | Freq: Every day | ORAL | 99 refills | Status: DC
Start: 2022-09-16 — End: 2023-10-06

## 2022-09-16 NOTE — Patient Instructions (Signed)
It was a pleasure to see you today.  We are glad you are feeling better from C. difficile gastroenteritis/colitis.  Pain management visit performed today.  You are handling your pain medications responsibly.  Urine drug screen performed.  Next visit due in July.  Recommend folate 1 mg daily for folate deficiency.  We will refill chronic pain medications this coming Friday.

## 2022-09-17 ENCOUNTER — Telehealth: Payer: Self-pay

## 2022-09-17 ENCOUNTER — Telehealth: Payer: Self-pay | Admitting: Internal Medicine

## 2022-09-17 ENCOUNTER — Encounter: Payer: Self-pay | Admitting: Internal Medicine

## 2022-09-17 MED ORDER — HYDROCODONE-ACETAMINOPHEN 10-325 MG PO TABS: ORAL_TABLET | ORAL | 0 refills | Status: DC

## 2022-09-17 NOTE — Telephone Encounter (Signed)
Patient called asking for Rx refill HYDROcodone-acetaminophen (NORCO) 10-325 MG tablet will be out tomorrow

## 2022-09-17 NOTE — Telephone Encounter (Signed)
Refill Norco 10/325 #90 tabs to be picked up at pharmacy tomorrow. MJB, MD  [place] PMP Aware checked. Patient has chronic foot pain and had recent OV for pain management.

## 2022-09-18 ENCOUNTER — Other Ambulatory Visit: Payer: Self-pay | Admitting: Internal Medicine

## 2022-09-18 DIAGNOSIS — N189 Chronic kidney disease, unspecified: Secondary | ICD-10-CM

## 2022-09-18 DIAGNOSIS — I2782 Chronic pulmonary embolism: Secondary | ICD-10-CM

## 2022-09-18 DIAGNOSIS — I824Y1 Acute embolism and thrombosis of unspecified deep veins of right proximal lower extremity: Secondary | ICD-10-CM

## 2022-09-18 LAB — DRUG MONITORING, PANEL 8 WITH CONFIRMATION, URINE
6 Acetylmorphine: NEGATIVE ng/mL (ref <10)
Alcohol Metabolites: POSITIVE ng/mL — AB (ref <500)
Amphetamines: NEGATIVE ng/mL (ref <500)
Benzodiazepines: NEGATIVE ng/mL (ref <100)
Buprenorphine, Urine: NEGATIVE ng/mL (ref <5)
Cocaine Metabolite: NEGATIVE ng/mL (ref <150)
Codeine: NEGATIVE ng/mL (ref <50)
Creatinine: 101.6 mg/dL (ref >=20.0)
Ethyl Glucuronide (ETG): >10000 ng/mL — ABNORMAL HIGH (ref <500)
Ethyl Sulfate (ETS): 6046 ng/mL — ABNORMAL HIGH (ref <100)
Hydrocodone: 1502 ng/mL — ABNORMAL HIGH (ref <50)
Hydromorphone: 1068 ng/mL — ABNORMAL HIGH (ref <50)
MDMA: NEGATIVE ng/mL (ref <500)
Marijuana Metabolite: NEGATIVE ng/mL (ref <20)
Morphine: NEGATIVE ng/mL (ref <50)
Norhydrocodone: 2447 ng/mL — ABNORMAL HIGH (ref <50)
Opiates: POSITIVE ng/mL — AB (ref <100)
Oxidant: NEGATIVE ug/mL (ref <200)
Oxycodone: NEGATIVE ng/mL (ref <100)
pH: 7 (ref 4.5–9.0)

## 2022-09-18 LAB — DM TEMPLATE

## 2022-09-28 ENCOUNTER — Telehealth: Payer: Self-pay | Admitting: Internal Medicine

## 2022-09-28 MED ORDER — ONDANSETRON HCL 4 MG PO TABS: 4.0000 mg | ORAL_TABLET | Freq: Three times a day (TID) | ORAL | 0 refills | Status: DC | PRN

## 2022-09-28 NOTE — Telephone Encounter (Addendum)
Roberto Reid [phone]  [name] called to say he is still having waves of nausea and was asking if he could possible get something called in for nausea. His daughter is visiting and he only gets to see her twice a year, so he does not want to come in today.  Patient does not want appt today. Sending in Zofran 4 mg to take every 8 hopurs as needed for nausea. MJB, MD

## 2022-10-07 ENCOUNTER — Ambulatory Visit (HOSPITAL_COMMUNITY): Payer: Medicare Other | Attending: Cardiology

## 2022-10-07 DIAGNOSIS — I350 Nonrheumatic aortic (valve) stenosis: Secondary | ICD-10-CM | POA: Insufficient documentation

## 2022-10-07 LAB — ECHOCARDIOGRAM COMPLETE
AR max vel: 1.43 cm2
AV Area VTI: 1.35 cm2
AV Area mean vel: 1.27 cm2
AV Mean grad: 21.8 mmHg
AV Peak grad: 36.3 mmHg
Ao pk vel: 3.01 m/s
Area-P 1/2: 3.42 cm2
Calc EF: 63.7 %
MV M vel: 5.51 m/s
MV Peak grad: 121.4 mmHg
Radius: 0.4 cm
S' Lateral: 3.1 cm
Single Plane A2C EF: 63 %
Single Plane A4C EF: 64.1 %

## 2022-10-12 ENCOUNTER — Other Ambulatory Visit: Payer: Self-pay | Admitting: Internal Medicine

## 2022-10-12 DIAGNOSIS — G8929 Other chronic pain: Secondary | ICD-10-CM

## 2022-10-14 ENCOUNTER — Ambulatory Visit: Payer: Medicare Other | Attending: Cardiovascular Disease | Admitting: Cardiovascular Disease

## 2022-10-14 VITALS — BP 108/71 | HR 84 | Ht 70.0 in | Wt 154.2 lb

## 2022-10-14 DIAGNOSIS — Z7901 Long term (current) use of anticoagulants: Secondary | ICD-10-CM

## 2022-10-14 DIAGNOSIS — I441 Atrioventricular block, second degree: Secondary | ICD-10-CM

## 2022-10-14 DIAGNOSIS — N1832 Chronic kidney disease, stage 3b: Secondary | ICD-10-CM

## 2022-10-14 DIAGNOSIS — I440 Atrioventricular block, first degree: Secondary | ICD-10-CM

## 2022-10-14 DIAGNOSIS — I10 Essential (primary) hypertension: Secondary | ICD-10-CM

## 2022-10-14 DIAGNOSIS — I4510 Unspecified right bundle-branch block: Secondary | ICD-10-CM | POA: Diagnosis not present

## 2022-10-14 DIAGNOSIS — I350 Nonrheumatic aortic (valve) stenosis: Secondary | ICD-10-CM | POA: Diagnosis not present

## 2022-10-14 DIAGNOSIS — Z86711 Personal history of pulmonary embolism: Secondary | ICD-10-CM

## 2022-10-14 NOTE — Progress Notes (Unsigned)
Cardiology Office Note    Date:  10/15/2022   ID:  BERNALDO PENTICO, DOB [birth year], MRN [id number]  PCP:  Margaree Mackintosh, MD  Cardiologist:  Nicki Guadalajara, MD   6 week F/U cardiology evaluation initially referred by Dr. Lenord Fellers due to concerns regarding bradycardia and shortness of breath.  History of Present Illness:  KHAIDEN REICHLE is a 89 y.o. male who is a longstanding patient of Dr. Luanna Cole. [name].  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  He has a history of right bundle branch block and remotely had seen Dr. Riley Kill in 2008 had a nuclear study which apparently showed normal perfusion.  He admits to a longstanding history of hypertension.  He tries to stay active and often rides a recumbent bike, he often plays golf approximately 3 days/week.  He denies any associated lightheadedness or dizziness.  He had recently seen Dr. Lenord Fellers and she was concerned regarding reduced heart rate which she had experienced over the weekend with heart rate decreasing to 51 and on another occasion down to 42 bpm.  He was not on beta-blocker therapy.  He denied associated chest pain, presyncope or syncope.  He had undergone laboratory on March 03, 2022 which showed a hemoglobin of 13.7 hematocrit of 39.1 with normal platelet count at 1 65,000.  Lipid studies reveal total cholesterol 140 HDL 46 triglycerides 75 and LDL 78.  Glucose was 107.  He had CKD with creatinine 1.89 consistent with stage IIIb CKD with GFR estimated at 34.  Hemoglobin A1c was 5.3.  Urinalysis was negative.  PSA was 0.52.  I saw him for my initial evaluation on April 14, 2022.  At that time he admitted that he was under increased stress  with the selling of his [place] large house and downsizing to a home in [place].  His wife recently has started working with an Chief Financial Officer.  He denies any exertionally  precipitated chest tightness or change in exercise tolerance.  He has history of mild glucose intolerance, GE reflux, hyperlipidemia, chronic insomnia, as well as Barrett's esophagus and adenomatous colonic polyp.  There is remote history of kidney stone.  He had suffered a lower lobe pneumonia in 2017 and has a history of hearing loss.  He had been followed by Dr. Cyndie Chime following a diagnosis of bilateral pulmonary embolism with a diagnosis of DVT and PE in January 2018 for which he has been on chronic Eliquis therapy.  During that evaluation he recommended he undergo a 2D echo Doppler study for assessment of LV systolic and diastolic function and valvular architecture.  With his episode of bradycardia I scheduled him to wear a 2-week Zio patch monitor and raise concern for possible sick sinus syndrome or other heart block.  His ECG showed sinus rhythm with first-degree AV block and right bundle branch block.  He underwent his 2D echo Doppler study in May 06, 2022.  This showed normal LV function with EF estimated at 60 to 65% with grade 2 diastolic dysfunction.  There was mild mitral regurgitation.  There was moderate aortic valve stenosis with a mean gradient of 24 and estimated valve area of 1.2 cm.  Mean gradient was 24 with peak gradient 36 mmHg.  A Zio patch monitor showed predominant sinus rhythm with average rate at 71 bpm.  The slowest sinus rhythm was  sinus bradycardia at 50 bpm with maximum sinus tachycardia at 112 bpm.  He had first-degree AV block with bundle branch block.  There was an episode of nonsustained VT lasting 6 beats at a average rate of 103 bpm (maximum 146 bpm.  There were 5 short episodes of SVT with the fastest interval lasting 4 beats with a maximum rate of 164 and the longest lasting 5 beats with an average rate at 116.  Secondary heart block was noted.  The slowest heart rate was 33 bpm which occurred at 5:54 AM on November 23 and was interpreted as second-degree Mobitz 2  block.  He had frequent PACs, occasional atrial couplets.  Ventricular ectopy was rare.  I last saw him on May 20, 2022 in follow-up of his monitor.  At that time he denied any chest pain or significant shortness of breath.  He denied any presyncope or syncope presently, he denies any chest pain or shortness of breath.  He denies any presyncope or syncope.  He experiences mild shortness of breath with activity.  During that evaluation, his ECG shows sinus rhythm at 80 with first-degree AV block, and right bundle branch block.  I reviewed his echo Doppler study as well as a Zio patch monitor.  He was found to have normal LV function with moderate aortic stenosis with mean gradient of 24 and peak gradient of 36.  In light of his findings on Zio patch monitor, I recommended an EP evaluation with Dr. Sharrell Ku.  With his creatinine of 1.8 age of 33 years he continues to be on low-dose Eliquis anticoagulation 2.5 mg twice a day.  His blood pressure was stable without orthostatic change on amlodipine and losartan.  Mr. Rajchel was evaluated by Dr. Sharrell Ku on June 30, 2022.  He was felt to have extensive conducted system disease but not advanced disease.  His block was predominantly nocturnal and a watchful waiting approach was recommended.  Since I last saw him, Mr. Bogardus underwent a 59-month follow-up echo Doppler study on Oct 07, 2022.  EF continued to be stable at 60 to 65%.  He had normal diastolic parameters and mild LVH.  He again was found to have moderate aortic stenosis with a mean gradient of 21.8 and peak gradient 36.3 mmHg.  Presently, he remains asymptomatic.  There is no chest pain.  He denies significant change in exercise tolerance.  There is no presyncope or syncope.  He continues to be on amlodipine 5 mg and losartan 100 mg daily for blood pressure control.  There is no bleeding on low-dose Eliquis 2.5 twice daily.  He takes Nexium twice a day for GERD.  He presents for  reevaluation.  Past Medical History:  Diagnosis Date   Arthritis    "right hand" (06/15/2016)   Childhood asthma    Chronic anticoagulation 08/30/2017   DVT, lower extremity, proximal, acute, right (HCC) 08/24/2016   06/15/16   GERD (gastroesophageal reflux disease)    Hearing loss    History of kidney stones    Hyperlipidemia    Hypertension    Pneumonia    "I've had it ~ 9 times as an adult; last time was 04/2016" (06/15/2016)    Past Surgical History:  Procedure Laterality Date   AMPUTATION TOE Right 09/27/2018   Procedure: Right 2nd toe amputation;  Surgeon: Toni Arthurs, MD;  Location: [facility];  Service: Orthopedics;  Laterality: Right;    CARPAL TUNNEL RELEASE Left 08/16/2020   Procedure:  LEFT CARPAL TUNNEL RELEASE;  Surgeon: Betha Loa, MD;  Location: [facility];  Service: Orthopedics;  Laterality: Left;   CATARACT EXTRACTION W/ INTRAOCULAR LENS  IMPLANT, BILATERAL Bilateral 04/2009   ELBOW BURSA SURGERY Right    FOOT SURGERY Right 2010   "attempted reconstruction" per pt   JOINT REPLACEMENT     KIDNEY STONE SURGERY     KNEE ARTHROSCOPY Right 10/08/2014   Procedure: ARTHROSCOPY KNEE;  Surgeon: Dannielle Huh, MD;  Location[facility];  Service: Orthopedics;  Laterality: Right;   KNEE ARTHROSCOPY Left    repair torn knee cartilage   QUADRICEPS TENDON REPAIR Right 10/08/2014   Procedure: REPAIR QUADRICEP TENDON;  Surgeon: Dannielle Huh, MD;  Location: [facility] OR;  Service: Orthopedics;  Laterality: Right;   TONSILLECTOMY     TOTAL KNEE ARTHROPLASTY  12/14/2011   Procedure: TOTAL KNEE ARTHROPLASTY;  Surgeon: Raymon Mutton, MD;  Location: [facility] OR;  Service: Orthopedics;  Laterality: Left;    Current Medications: Outpatient Medications Prior to Visit  Medication Sig Dispense Refill   amLODipine (NORVASC) 5 MG tablet TAKE ONE TABLET BY MOUTH DAILY 90 tablet 3   clonazePAM (KLONOPIN) 0.5 MG tablet TAKE 1 TO 2 TABLETS BY MOUTH DAILY AS NEEDED FOR ANXIETY 60  tablet 1   Coenzyme Q10 (CO Q-10 PO) Take 1 tablet by mouth daily. 300 mg     ELIQUIS 2.5 MG TABS tablet TAKE 1 TABLET BY MOUTH TWICE A DAY 180 tablet 0   esomeprazole (NEXIUM) 40 MG capsule Take 1 capsule (40 mg total) by mouth 2 (two) times daily. 60 capsule 11   folic acid (FOLVITE) 1 MG tablet Take 1 tablet (1 mg total) by mouth daily. 100 tablet PRN   loratadine (CLARITIN) 10 MG tablet Take 10 mg by mouth daily as needed for allergies.     losartan (COZAAR) 100 MG tablet TAKE ONE TABLET BY MOUTH DAILY 90 tablet 3   lubiprostone (AMITIZA) 24 MCG capsule Take 1 capsule (24 mcg total) by mouth 2 (two) times daily with a meal. 60 capsule 11   Multiple Vitamins-Minerals (MULTIVITAMIN WITH MINERALS) tablet Take 1 tablet by mouth daily.     mupirocin cream (BACTROBAN) 2 % Apply 1 Application topically 2 (two) times daily. 15 g 0   Omega-3 Fatty Acids (OMEGA 3 PO) Take by mouth.     ondansetron (ZOFRAN) 4 MG tablet Take 1 tablet (4 mg total) by mouth every 8 (eight) hours as needed for nausea or vomiting. 20 tablet 0   QUEtiapine (SEROQUEL) 50 MG tablet TAKE 1 TABLET BY MOUTH EVERY NIGHT AT BEDTIME 90 tablet 0   tamsulosin (FLOMAX) 0.4 MG CAPS capsule Take 1 capsule (0.4 mg total) by mouth daily. 30 capsule 3   Testosterone 20.25 MG/ACT (1.62%) GEL 20.25 mg by Pump Prime route daily. Apply 6 pumps everyday as directed.     zolpidem (AMBIEN) 10 MG tablet TAKE ONE TABLET BY MOUTH EVERY NIGHT AT BEDTIME 90 tablet 1   HYDROcodone-acetaminophen (NORCO) 10-325 MG tablet One tab by mouth every 6-8 hours  for chronic pain 90 tablet 0   Facility-Administered Medications Prior to Visit  Medication Dose Route Frequency Provider Last Rate Last Admin   mupirocin cream (BACTROBAN) 2 %   Topical Once [name], Luanna Cole, MD         Allergies:   Oxycodone-acetaminophen, Percocet [oxycodone-acetaminophen], Sulfa antibiotics, and Sulfamethoxazole   Social History   Socioeconomic History   Marital status: Married     Spouse  name: Not on file   Number of children: 2   Years of education: plus some graduate work   Highest education level: Bachelor's degree (e.g., BA, AB, BS)  Occupational History   Not on file  Tobacco Use   Smoking status: Former    Packs/day: 0.75    Years: 37.00    Additional pack years: 0.00    Total pack years: 27.75    Types: Cigarettes    Quit date: 12/14/1991    Years since quitting: 30.8   Smokeless tobacco: Never  Vaping Use   Vaping Use: Never used  Substance and Sexual Activity   Alcohol use: Yes    Alcohol/week: 7.0 - 10.0 standard drinks of alcohol    Types: 7 - 10 Shots of liquor per week    Comment: 2 cocktails daily   Drug use: No   Sexual activity: Yes  Other Topics Concern   Not on file  Social History Narrative   Lives at home with wife   Right handed   Caffeine: 2 cups/day   Social Determinants of Health   Financial Resource Strain: Not on file  Food Insecurity: Not on file  Transportation Needs: Not on file  Physical Activity: Not on file  Stress: Not on file  Social Connections: Not on file    Socially he is married in his second marriage.  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  Family History:  The patient's family history includes Parkinson's disease in his father.  His father died at age 64 with Parkinson's disease and had a history of diabetes.  His mother died at age 70.  He has 2 sisters.  He has a son age 10 and a daughter age 31 in good health.  He has 5 grandchildren and 4 great-grandchildren.  ROS General: Negative; No fevers, chills, or night sweats;  HEENT: Hearing loss ; No changes in vision, sinus congestion, difficulty swallowing Pulmonary: Remote history of PE on long-term Eliquis Cardiovascular: See HPI GI: Negative; No nausea, vomiting, diarrhea, or abdominal pain GU: Negative; No dysuria, hematuria, or difficulty voiding Musculoskeletal: Negative; no  myalgias, joint pain, or weakness Hematologic/Oncology: Negative; no easy bruising, bleeding Endocrine: Negative; no heat/cold intolerance; no diabetes Neuro: Negative; no changes in balance, headaches Skin: Negative; No rashes or skin lesions Psychiatric: Negative; No behavioral problems, depression Sleep: Negative; No snoring, daytime sleepiness, hypersomnolence, bruxism, restless legs, hypnogognic hallucinations, no cataplexy Other comprehensive 14 point system review is negative.   PHYSICAL EXAM:   VS:  BP 108/71   Pulse 84   Ht 5\' 10"  (1.778 m)   Wt 154 lb 3.2 oz (69.9 kg)   SpO2 100%   BMI 22.13 kg/m     Repeat blood pressure by me was 120/64  Wt Readings from Last 3 Encounters:  10/14/22 154 lb 3.2 oz (69.9 kg)  09/15/22 164 lb 12.8 oz (74.8 kg)  09/03/22 164 lb 12.8 oz (74.8 kg)    General: Alert, oriented, no distress.  Skin: normal turgor, no rashes, warm and dry HEENT: Normocephalic, atraumatic. Pupils equal round and reactive to light; sclera anicteric; extraocular muscles intact; Nose without nasal septal hypertrophy Mouth/Parynx benign; Mallinpatti scale 3 Neck: No JVD, no carotid bruits; normal carotid upstroke Lungs: clear to ausculatation and percussion; no wheezing or rales Chest wall: without tenderness to palpitation Heart: PMI not displaced, RRR, s1 s2 normal, 2/6 systolic murmur in the aortic area;  no diastolic murmur, no rubs, gallops, thrills, or heaves Abdomen: soft, nontender; no hepatosplenomehaly, BS+; abdominal aorta nontender and not dilated by palpation. Back: no CVA tenderness Pulses 2+ Musculoskeletal: full range of motion, normal strength, no joint deformities Extremities: Dry and scaly lower extremities;no clubbing cyanosis or edema, Homan's sign negative  Neurologic: grossly nonfocal; Cranial nerves grossly wnl Psychologic: Normal mood and affect   Studies/Labs Reviewed:   Oct 14, 2022 ECG (independently read by me): Sinus rhythm at  84, 1st degree AV block, PR 256, RBBB  May 20, 2022 ECG (independently read by me): Sinus rhythm at 80; 1st degree AV block, RBBB, PR 220 msec  April 07, 2022 ECG (independently read by me): Sinus rhythm at 78; 1st degree AV block, PR 228 msec, RBBB with repolarization changes  Recent Labs:    Latest Ref Rng & Units 09/03/2022   11:47 AM 05/22/2022   11:45 AM 04/07/2022   10:58 AM  BMP  Glucose 65 - 99 mg/dL 93  161  096   BUN 7 - 25 mg/dL 34  32  26   Creatinine 0.70 - 1.22 mg/dL 0.45  4.09  8.11   BUN/Creat Ratio 6 - 22 (calc) 20  14  14    Sodium 135 - 146 mmol/L 139  138  141   Potassium 3.5 - 5.3 mmol/L 4.6  4.4  5.0   Chloride 98 - 110 mmol/L 105  103  107   CO2 20 - 32 mmol/L 25  24  28    Calcium 8.6 - 10.3 mg/dL 8.9  8.8  9.0         Latest Ref Rng & Units 09/03/2022   11:47 AM 05/22/2022   11:45 AM 04/07/2022   10:58 AM  Hepatic Function  Total Protein 6.1 - 8.1 g/dL 6.4  6.3  6.2   AST 10 - 35 U/L 9  10  9    ALT 9 - 46 U/L 6  3  7    Total Bilirubin 0.2 - 1.2 mg/dL 1.1  2.4  1.4        Latest Ref Rng & Units 09/15/2022   11:26 AM 09/03/2022   11:47 AM 05/22/2022   11:45 AM  CBC  WBC 3.8 - 10.8 Thousand/uL 6.3  5.2  4.1   Hemoglobin 13.2 - 17.1 g/dL 91.4  78.2  95.6   Hematocrit 38.5 - 50.0 % 34.4  35.5  33.5   Platelets 140 - 400 Thousand/uL 220  210  118    Lab Results  Component Value Date   MCV 98.0 09/15/2022   MCV 98.3 09/03/2022   MCV 97.7 05/22/2022   Lab Results  Component Value Date   TSH 3.79 04/07/2022   Lab Results  Component Value Date   HGBA1C 5.3 03/03/2022     BNP    Component Value Date/Time   BNP 121 (H) 04/07/2022 1058    ProBNP No results found for: "PROBNP"   Lipid Panel     Component Value Date/Time   CHOL 140 03/03/2022 1003   TRIG 75 03/03/2022 1003   HDL 46 03/03/2022 1003   CHOLHDL 3.0 03/03/2022 1003   VLDL 42 (H) 02/18/2016 1011   LDLCALC 78 03/03/2022 1003     RADIOLOGY: ECHOCARDIOGRAM  COMPLETE  Result Date: 10/07/2022    ECHOCARDIOGRAM REPORT   Patient Name:   ATTILA CUSIMANO Date of Exam: 10/07/2022 Medical Rec #:  [id number]        Height:  70.0 in Accession #:    [id number]       Weight:       164.8 lb Date of Birth:  Apr 25, 1933         BSA:          1.922 m Patient Age:    89 years         BP:           126/64 mmHg Patient Gender: M                HR:           70 bpm. Exam Location:  [address] Procedure: 2D Echo, 3D Echo, Cardiac Doppler, Color Doppler and Strain Analysis Indications:    Aortic stenosis I35.0  History:        Patient has prior history of Echocardiogram examinations, most                 recent 05/06/2022. Risk Factors:GERD, Hypertension and                 Dyslipidemia.  Sonographer:    Eulah Pont RDCS Referring Phys: [phone] Nihar Klus A Alycia Cooperwood  Sonographer Comments: Global longitudinal strain was attempted. IMPRESSIONS  1. Left ventricular ejection fraction, by estimation, is 60 to 65%. The left ventricle has normal function. The left ventricle has no regional wall motion abnormalities. There is mild left ventricular hypertrophy. Left ventricular diastolic parameters were normal.  2. Right ventricular systolic function is normal. The right ventricular size is normal. There is normal pulmonary artery systolic pressure. The estimated right ventricular systolic pressure is 29.0 mmHg.  3. Right atrial size was mildly dilated.  4. The mitral valve is normal in structure. Trivial mitral valve regurgitation. No evidence of mitral stenosis.  5. The aortic valve was not well visualized. There is moderate calcification of the aortic valve. Aortic valve regurgitation is not visualized. Moderate aortic valve stenosis. Vmax 3.3 m/s, MG 24 mmHg, AVA 1.3 cm^2, DI 0.34  6. The inferior vena cava is normal in size with greater than 50% respiratory variability, suggesting right atrial pressure of 3 mmHg. FINDINGS  Left Ventricle: Left ventricular ejection fraction, by estimation, is 60 to  65%. The left ventricle has normal function. The left ventricle has no regional wall motion abnormalities. 3D ejection fraction reviewed and evaluated as part of the interpretation. Alternate measurement of EF is felt to be most reflective of LV function. The left ventricular internal cavity size was normal in size. There is mild left ventricular hypertrophy. Left ventricular diastolic parameters were normal. Right Ventricle: The right ventricular size is normal. No increase in right ventricular wall thickness. Right ventricular systolic function is normal. There is normal pulmonary artery systolic pressure. The tricuspid regurgitant velocity is 2.55 m/s, and  with an assumed right atrial pressure of 3 mmHg, the estimated right ventricular systolic pressure is 29.0 mmHg. Left Atrium: Left atrial size was normal in size. Right Atrium: Right atrial size was mildly dilated. Pericardium: There is no evidence of pericardial effusion. Mitral Valve: The mitral valve is normal in structure. Trivial mitral valve regurgitation. No evidence of mitral valve stenosis. Tricuspid Valve: The tricuspid valve is normal in structure. Tricuspid valve regurgitation is mild. Aortic Valve: The aortic valve was not well visualized. There is moderate calcification of the aortic valve. Aortic valve regurgitation is not visualized. Moderate aortic stenosis is present. Aortic valve mean gradient measures 21.8 mmHg. Aortic valve peak gradient measures 36.3 mmHg. Aortic valve area,  by VTI measures 1.35 cm. Pulmonic Valve: The pulmonic valve was not well visualized. Pulmonic valve regurgitation is trivial. Aorta: The aortic root and ascending aorta are structurally normal, with no evidence of dilitation. Venous: The inferior vena cava is normal in size with greater than 50% respiratory variability, suggesting right atrial pressure of 3 mmHg. IAS/Shunts: The interatrial septum was not well visualized.  LEFT VENTRICLE PLAX 2D LVIDd:         4.40  cm      Diastology LVIDs:         3.10 cm      LV e' medial:    7.42 cm/s LV PW:         1.00 cm      LV E/e' medial:  17.0 LV IVS:        1.30 cm      LV e' lateral:   9.62 cm/s LVOT diam:     2.20 cm      LV E/e' lateral: 13.1 LV SV:         94 LV SV Index:   49 LVOT Area:     3.80 cm                              3D Volume EF: LV Volumes (MOD)            3D EF:        58 % LV vol d, MOD A2C: 102.0 ml LV EDV:       150 ml LV vol d, MOD A4C: 85.8 ml  LV ESV:       63 ml LV vol s, MOD A2C: 37.7 ml  LV SV:        87 ml LV vol s, MOD A4C: 30.8 ml LV SV MOD A2C:     64.3 ml LV SV MOD A4C:     85.8 ml LV SV MOD BP:      61.4 ml RIGHT VENTRICLE RV S prime:     12.40 cm/s TAPSE (M-mode): 2.1 cm LEFT ATRIUM             Index        RIGHT ATRIUM           Index LA diam:        4.80 cm 2.50 cm/m   RA Area:     21.20 cm LA Vol (A2C):   52.5 ml 27.31 ml/m  RA Volume:   58.30 ml  30.33 ml/m LA Vol (A4C):   56.6 ml 29.44 ml/m LA Biplane Vol: 56.6 ml 29.44 ml/m  AORTIC VALVE AV Area (Vmax):    1.43 cm AV Area (Vmean):   1.27 cm AV Area (VTI):     1.35 cm AV Vmax:           301.40 cm/s AV Vmean:          222.200 cm/s AV VTI:            0.693 m AV Peak Grad:      36.3 mmHg AV Mean Grad:      21.8 mmHg LVOT Vmax:         113.00 cm/s LVOT Vmean:        74.500 cm/s LVOT VTI:          0.246 m LVOT/AV VTI ratio: 0.35  AORTA Ao Root diam: 3.50 cm Ao Asc diam:  3.60 cm MITRAL VALVE  TRICUSPID VALVE MV Area (PHT): 3.42 cm       TR Peak grad:   26.0 mmHg MV Decel Time: 222 msec       TR Vmax:        255.00 cm/s MR Peak grad:    121.4 mmHg MR Mean grad:    84.0 mmHg    SHUNTS MR Vmax:         551.00 cm/s  Systemic VTI:  0.25 m MR Vmean:        444.0 cm/s   Systemic Diam: 2.20 cm MR PISA:         1.01 cm MR PISA Eff ROA: 7 mm MR PISA Radius:  0.40 cm MV E velocity: 126.00 cm/s MV A velocity: 81.00 cm/s MV E/A ratio:  1.56 Epifanio Lesches MD Electronically signed by Epifanio Lesches MD Signature Date/Time:  10/07/2022/6:20:06 PM    Final      Additional studies/ records that were reviewed today include:   I reviewed the records of Dr. Lenord Fellers.  ECHO: 05/06/2022 IMPRESSIONS   1. The aortic valve is tricuspid. There is moderate calcification of the  aortic valve. There is moderate thickening of the aortic valve. Aortic  valve regurgitation is not visualized. Moderate aortic valve stenosis.  Aortic valve area, by VTI measures  1.20 cm. Aortic valve mean gradient measures 24.0 mmHg. Aortic valve Vmax  measures 3.00 m/s.   2. Left ventricular ejection fraction, by estimation, is 60 to 65%. The  left ventricle has normal function. The left ventricle has no regional  wall motion abnormalities. Left ventricular diastolic parameters are  consistent with Grade II diastolic  dysfunction (pseudonormalization).   3. Right ventricular systolic function is normal. The right ventricular  size is mildly enlarged. There is normal pulmonary artery systolic  pressure. The estimated right ventricular systolic pressure is 31.5 mmHg.   4. The mitral valve is grossly normal. Mild mitral valve regurgitation.  No evidence of mitral stenosis.   5. Tricuspid valve regurgitation is mild to moderate.   6. The inferior vena cava is normal in size with greater than 50%  respiratory variability, suggesting right atrial pressure of 3 mmHg.   Comparison(s): Changes from prior study are noted.   Patch Wear Time:  13 days and 23 hours (2023-11-10T10:05:21-0500 to 2023-11-24T10:05:17-0500)   Patient had a min HR of 33 bpm, max HR of 164 bpm, and avg HR of 71 bpm. Predominant underlying rhythm was Sinus Rhythm. First Degree AV Block was present. Bundle Branch Block/IVCD was present. 1 run of Ventricular Tachycardia occurred lasting 6 beats  with a max rate of 146 bpm (avg 103 bpm). 5 Supraventricular Tachycardia runs occurred, the run with the fastest interval lasting 4 beats with a max rate of 164 bpm, the longest lasting 5  beats with an avg rate of 116 bpm. 4150 episode(s) of AV Block  (2nd Mobitz II) occurred, lasting a total of 10 hours 4 mins. AV Block (2nd Mobitz II) was detected within +/- 45 seconds of symptomatic patient event(s). Isolated SVEs were frequent (11.6%, [id number]), SVE Couplets were occasional (2.7%, [id number]), and  SVE Triplets were rare (<1.0%, 4437). Isolated VEs were rare (<1.0%), and no VE Couplets or VE Triplets were present. MD notification criteria for Symptomatic Second Degree AV Block, Mobitz II met - report posted prior to notification per account request  (JC).   The predominant rhythm was sinus with average rate 71, minimum sinus rhythm at 50 bpm and maximum sinus tachycardia at 112  bpm.  There was a 6 beat run of nonsustained VT at an average rate of 103 (max 146.  There were 5 episodes of SVT.  The slowest heart rate was 33 bpm which occurred at 5:54 AM and was consistent with second-degree Mobitz 2 block.  There were numerous episodes of second-degree AV block lasting a total of 10 hours 4 minutes.  Isolated PACs were frequent with occasional atrial couplets.  There was rare ventricular ectopy  .   ECHO: 10/07/2022  1. Left ventricular ejection fraction, by estimation, is 60 to 65%. The  left ventricle has normal function. The left ventricle has no regional  wall motion abnormalities. There is mild left ventricular hypertrophy.  Left ventricular diastolic parameters  were normal.   2. Right ventricular systolic function is normal. The right ventricular  size is normal. There is normal pulmonary artery systolic pressure. The  estimated right ventricular systolic pressure is 29.0 mmHg.   3. Right atrial size was mildly dilated.   4. The mitral valve is normal in structure. Trivial mitral valve  regurgitation. No evidence of mitral stenosis.   5. The aortic valve was not well visualized. There is moderate  calcification of the aortic valve. Aortic valve regurgitation is not   visualized. Moderate aortic valve stenosis. Vmax 3.3 m/s, MG 24 mmHg, AVA  1.3 cm^2, DI 0.34   6. The inferior vena cava is normal in size with greater than 50%  respiratory variability, suggesting right atrial pressure of 3 mmHg.    ASSESSMENT:    1. Nonrheumatic aortic valve stenosis   2. Primary hypertension   3. RBBB   4. First degree AV block   5. AV block, 2nd degree on Zio monitor   6. Stage 3b chronic kidney disease (HCC)   7. Chronic anticoagulation   8. History of pulmonary embolism: Bilateral PE with DVT lower extremity 2017     PLAN:  Mr. Daonte Gribben is a very pleasant 89 year old gentleman who is a longstanding patient of Dr. Lenord Fellers who had noticed episodes of bradycardia with heart rates decreasing to 41 bpm and 42 bpm associated with mild shortness of breath.  He has not been on any rate control medication.  He has a history of hypertension and has been on amlodipine 5 mg daily in addition to losartan 100 mg daily which she has tolerated well and has not had significant orthostatic blood pressure changes.  Remotely he had experienced a pulmonary embolism following an episode of pneumonia and was followed closely by Dr. Riley Churches who has since retired but it was advised that he continue anticoagulation indefinitely.  Most recently he has been on low-dose Eliquis at 2.5 mg twice a day in light of his age and renal function.  He has a history of GI reflux and continues to be on Nexium.  At times he has taken Klonopin for anxiety and had noticed some increase stress associated with moving from his long term dwelling with a large house in [place] to a smaller house in [place]. He denies any presyncope or syncope.  When I initially saw him he was unaware of any cardiac murmur.  He also had stage IIIb CKD.  His echo Doppler study in December 2023 showed  normal LV systolic function with EF at 60 to 65% with grade 2 diastolic dysfunction.  There is evidence for mild MR and  moderate aortic valve stenosis with a mean gradient of 24 and peak gradient of 36 with estimated AVA at  1.2 cm. A Zio patch monitor demonstrated predominant sinus rhythm but he did have 1 run of nonsustained VT of 6 beats at an average rate of 103 (max 146.  There were 5 beats of SVT.  He also had numerous episodes of second-degree AV block felt to be Mobitz 2.  His shortness of breath may be contributed both by his grade 2 diastolic dysfunction as well as aortic valve stenosis.  He has right bundle branch block and previously noted first-degree AV block. His Zio patch findings, he has undergone evaluation by Dr. Ladona Ridgel who felt he was relatively stable presently and a watchful waiting approach was recommended.  Mr. Carle underwent a 70-month follow-up echo Doppler study on Oct 07, 2022 which I reviewed in detail with him today.  LV function remains stable.  His aortic valve remained fairly stable on this echo the mean gradient was 21.8 mmHg with a peak gradient of 36.3 mmHg with aortic valve area 1.3 cm.  His blood pressure today continues to be stable.  He remains relatively symptom-free with reference to his AS.  Blood pressure remained stable.  I reviewed recent laboratory from September 03, 2022.  He has stage IIIb CKD with creatinine 1.74.  LFTs were normal.  He is mildly anemic with hemoglobin 12 and hematocrit 35.5.  He will be following up with Dr. Lenord Fellers in July and will be seeing Dr. Leone Payor for GI evaluation in September 2024.  I have recommended that he undergo a follow-up echo Doppler study in April 2025 and as long as he is stable in the interim, I will see him for follow-up evaluation after that study.   Medication Adjustments/Labs and Tests Ordered: Current medicines are reviewed at length with the patient today.  Concerns regarding medicines are outlined above.  Medication changes, Labs and Tests ordered today are listed in the Patient Instructions below. Patient Instructions  Medication  Instructions:  No changes *If you need a refill on your cardiac medications before your next appointment, please call your pharmacy*  Testing/Procedures: Your physician has requested that you have an echocardiogram in April 2025. Echocardiography is a painless test that uses sound waves to create images of your heart. It provides your doctor with information about the size and shape of your heart and how well your heart's chambers and valves are working. This procedure takes approximately one hour. There are no restrictions for this procedure. Please do NOT wear cologne, perfume, aftershave, or lotions (deodorant is allowed). Please arrive 15 minutes prior to your appointment time.    Follow-Up: At [facility], you and your health needs are our priority.  As part of our continuing mission to provide you with exceptional heart care, we have created designated Provider Care Teams.  These Care Teams include your primary Cardiologist (physician) and Advanced Practice Providers (APPs -  Physician Assistants and Nurse Practitioners) who all work together to provide you with the care you need, when you need it.  We recommend signing up for the patient portal called "MyChart".  Sign up information is provided on this After Visit Summary.  MyChart is used to connect with patients for Virtual Visits (Telemedicine).  Patients are able to view lab/test results, encounter notes, upcoming appointments, etc.  Non-urgent messages can be sent to your provider as well.   To learn more about what you can do with MyChart, go to [email or web address].    Your next appointment:   1 year(s) after Echo in April  Provider:   Maisie Fus  Tresa Endo, MD       Signed, Nicki Guadalajara, MD  10/15/2022 6:28 PM    [facility] [address], [address], [place], [place]  [address] Phone: [phone]

## 2022-10-14 NOTE — Patient Instructions (Addendum)
Medication Instructions:  No changes *If you need a refill on your cardiac medications before your next appointment, please call your pharmacy*  Testing/Procedures: Your physician has requested that you have an echocardiogram in April 2025. Echocardiography is a painless test that uses sound waves to create images of your heart. It provides your doctor with information about the size and shape of your heart and how well your heart's chambers and valves are working. This procedure takes approximately one hour. There are no restrictions for this procedure. Please do NOT wear cologne, perfume, aftershave, or lotions (deodorant is allowed). Please arrive 15 minutes prior to your appointment time.    Follow-Up: At [facility], you and your health needs are our priority.  As part of our continuing mission to provide you with exceptional heart care, we have created designated Provider Care Teams.  These Care Teams include your primary Cardiologist (physician) and Advanced Practice Providers (APPs -  Physician Assistants and Nurse Practitioners) who all work together to provide you with the care you need, when you need it.  We recommend signing up for the patient portal called "MyChart".  Sign up information is provided on this After Visit Summary.  MyChart is used to connect with patients for Virtual Visits (Telemedicine).  Patients are able to view lab/test results, encounter notes, upcoming appointments, etc.  Non-urgent messages can be sent to your provider as well.   To learn more about what you can do with MyChart, go to [email or web address].    Your next appointment:   1 year(s) after Echo in April  Provider:   Nicki Guadalajara, MD

## 2022-10-15 ENCOUNTER — Encounter: Payer: Self-pay | Admitting: Cardiovascular Disease

## 2022-10-15 ENCOUNTER — Encounter: Payer: Self-pay | Admitting: Internal Medicine

## 2022-10-15 ENCOUNTER — Telehealth: Payer: Self-pay | Admitting: Internal Medicine

## 2022-10-15 DIAGNOSIS — R52 Pain, unspecified: Secondary | ICD-10-CM

## 2022-10-15 DIAGNOSIS — Z7901 Long term (current) use of anticoagulants: Secondary | ICD-10-CM

## 2022-10-15 DIAGNOSIS — M21961 Unspecified acquired deformity of right lower leg: Secondary | ICD-10-CM

## 2022-10-15 DIAGNOSIS — G8929 Other chronic pain: Secondary | ICD-10-CM

## 2022-10-15 MED ORDER — HYDROCODONE-ACETAMINOPHEN 10-325 MG PO TABS: ORAL_TABLET | ORAL | 0 refills | Status: DC

## 2022-10-15 NOTE — Telephone Encounter (Addendum)
Claiborne Bingaman [phone]  [name] called to say it is time for his below refill, he is going to be going out of town this week end.  HYDROcodone-acetaminophen [facility]) 10-325 MG tablet   [facility] [id number] - [place], [place] - [address] Phone: [phone]  Fax: [phone]      Have refilled Norco 10 325 and he is to pick this up tomorrow as he is going out of town for the weekend. [place] PMP Aware checked. He uses his meds reliably and responsibly. MJB, MD

## 2022-11-03 ENCOUNTER — Other Ambulatory Visit: Payer: Self-pay

## 2022-11-03 MED ORDER — MUPIROCIN CALCIUM 2 % EX CREA: 1.0000 | TOPICAL_CREAM | Freq: Two times a day (BID) | CUTANEOUS | 0 refills | Status: DC

## 2022-11-16 ENCOUNTER — Telehealth: Payer: Self-pay | Admitting: Internal Medicine

## 2022-11-16 ENCOUNTER — Encounter: Payer: Self-pay | Admitting: Internal Medicine

## 2022-11-16 DIAGNOSIS — M21961 Unspecified acquired deformity of right lower leg: Secondary | ICD-10-CM

## 2022-11-16 DIAGNOSIS — G8929 Other chronic pain: Secondary | ICD-10-CM

## 2022-11-16 DIAGNOSIS — Z7901 Long term (current) use of anticoagulants: Secondary | ICD-10-CM

## 2022-11-16 DIAGNOSIS — R52 Pain, unspecified: Secondary | ICD-10-CM

## 2022-11-16 DIAGNOSIS — N1832 Chronic kidney disease, stage 3b: Secondary | ICD-10-CM

## 2022-11-16 MED ORDER — HYDROCODONE-ACETAMINOPHEN 10-325 MG PO TABS: ORAL_TABLET | ORAL | 0 refills | Status: DC

## 2022-11-16 NOTE — Telephone Encounter (Addendum)
Roberto Reid [phone]  [name] called to say he needs refill on below medication, he runs out today.  HYDROcodone-acetaminophen [facility]) 10-325 MG tablet    [facility] [id number] [place], [place] - [address] Phone: [phone]  Fax: [phone]     Patient takes med reliably and responsibly. Had pain management in person visit in April.

## 2022-11-17 DIAGNOSIS — C44622 Squamous cell carcinoma of skin of right upper limb, including shoulder: Secondary | ICD-10-CM | POA: Diagnosis not present

## 2022-11-30 ENCOUNTER — Other Ambulatory Visit: Payer: Self-pay

## 2022-11-30 MED ORDER — QUETIAPINE FUMARATE 50 MG PO TABS: 50.0000 mg | ORAL_TABLET | Freq: Every day | ORAL | 0 refills | Status: DC

## 2022-12-01 ENCOUNTER — Telehealth: Payer: Self-pay | Admitting: Internal Medicine

## 2022-12-01 MED ORDER — LEVOFLOXACIN 500 MG PO TABS: 500.0000 mg | ORAL_TABLET | Freq: Every day | ORAL | 0 refills | Status: AC

## 2022-12-01 NOTE — Telephone Encounter (Signed)
Done

## 2022-12-01 NOTE — Telephone Encounter (Signed)
Patient called and said he is going out of town tomorrow and asked if levofloxacin can be called in for him. He said its for his foot and that Dr Lenord Fellers was aware of the problem.

## 2022-12-10 NOTE — Progress Notes (Shared)
Patient Care Team: Margaree Mackintosh, MD as PCP - General (Internal Medicine) Lennette Bihari, MD as PCP - Cardiology (Cardiology) Levert Feinstein, MD as Consulting Physician (Oncology) Bufford Buttner, MD as Consulting Physician (Nephrology)  Visit Date: 12/10/22  Subjective:    Patient ID: Roberto Reid , Male   DOB: 28-Feb-1933, 89 y.o.    MRN: [id number]   89 y.o. Male presents today for a 3 month follow-up.  History of hypertension treated with amlodipine 5 mg daily, losartan 100 mg daily. Blood pressure normal at 126/64.   History of musculoskeletal pain treated with hydrocodone-acetaminophen 10-325 mg every 6-8 hours as needed. This has been helping to keep him active.  Next chronic pain visit is due in July.   History of GERD stable with esomeprazole 40 mg twice daily.    Past Medical History:  Diagnosis Date   Arthritis    "right hand" (06/15/2016)   Childhood asthma    Chronic anticoagulation 08/30/2017   DVT, lower extremity, proximal, acute, right (HCC) 08/24/2016   06/15/16   GERD (gastroesophageal reflux disease)    Hearing loss    History of kidney stones    Hyperlipidemia    Hypertension    Pneumonia    "I've had it ~ 9 times as an adult; last time was 04/2016" (06/15/2016)     Family History  Problem Relation Age of Onset   Parkinson's disease Father     Social History   Social History Narrative   Lives at home with wife   Right handed   Caffeine: 2 cups/day      ROS      Objective:   Vitals: There were no vitals taken for this visit.   Physical Exam    Results:   Studies obtained and personally reviewed by me:  Imaging, colonoscopy, mammogram, bone density scan, echocardiogram, heart cath, stress test, CT calcium score, etc. ***   Labs:       Component Value Date/Time   NA 139 09/03/2022 1147   NA 138 08/23/2017 1101   K 4.6 09/03/2022 1147   CL 105 09/03/2022 1147   CO2 25 09/03/2022 1147   GLUCOSE 93 09/03/2022  1147   BUN 34 (H) 09/03/2022 1147   BUN 34 (H) 08/23/2017 1101   CREATININE 1.74 (H) 09/03/2022 1147   CALCIUM 8.9 09/03/2022 1147   PROT 6.4 09/03/2022 1147   ALBUMIN 3.5 08/24/2016 1519   AST 9 (L) 09/03/2022 1147   ALT 6 (L) 09/03/2022 1147   ALKPHOS 33 (L) 08/24/2016 1519   BILITOT 1.1 09/03/2022 1147   GFRNONAA 44 (L) 05/07/2020 1635   GFRAA 51 (L) 05/07/2020 1635     Lab Results  Component Value Date   WBC 6.3 09/15/2022   HGB 11.6 (L) 09/15/2022   HCT 34.4 (L) 09/15/2022   MCV 98.0 09/15/2022   PLT 220 09/15/2022    Lab Results  Component Value Date   CHOL 140 03/03/2022   HDL 46 03/03/2022   LDLCALC 78 03/03/2022   TRIG 75 03/03/2022   CHOLHDL 3.0 03/03/2022    Lab Results  Component Value Date   HGBA1C 5.3 03/03/2022     Lab Results  Component Value Date   TSH 3.79 04/07/2022     Lab Results  Component Value Date   PSA 0.42 03/03/2022   PSA 0.44 02/21/2021   PSA 0.45 02/19/2020   *** delete for male pts  ***    Assessment & Plan:   ***  I,Alexander Ruley,acting as a Neurosurgeon for Margaree Mackintosh, MD.,have documented all relevant documentation on the behalf of Margaree Mackintosh, MD,as directed by  Margaree Mackintosh, MD while in the presence of Margaree Mackintosh, MD.   ***

## 2022-12-17 ENCOUNTER — Other Ambulatory Visit: Payer: Self-pay

## 2022-12-17 ENCOUNTER — Ambulatory Visit: Payer: Medicare Other | Admitting: Internal Medicine

## 2022-12-17 MED ORDER — HYDROCODONE-ACETAMINOPHEN 10-325 MG PO TABS: ORAL_TABLET | ORAL | 0 refills | Status: DC

## 2022-12-17 NOTE — Telephone Encounter (Signed)
Appt tomorrow.

## 2022-12-18 ENCOUNTER — Ambulatory Visit (INDEPENDENT_AMBULATORY_CARE_PROVIDER_SITE_OTHER): Payer: Medicare Other | Admitting: Internal Medicine

## 2022-12-18 ENCOUNTER — Encounter: Payer: Self-pay | Admitting: Internal Medicine

## 2022-12-18 VITALS — BP 108/60 | HR 69 | Resp 16 | Wt 165.8 lb

## 2022-12-18 DIAGNOSIS — I10 Essential (primary) hypertension: Secondary | ICD-10-CM

## 2022-12-18 DIAGNOSIS — N1832 Chronic kidney disease, stage 3b: Secondary | ICD-10-CM

## 2022-12-18 DIAGNOSIS — G8929 Other chronic pain: Secondary | ICD-10-CM

## 2022-12-18 DIAGNOSIS — R52 Pain, unspecified: Secondary | ICD-10-CM

## 2022-12-18 DIAGNOSIS — M79671 Pain in right foot: Secondary | ICD-10-CM

## 2022-12-18 DIAGNOSIS — Z7901 Long term (current) use of anticoagulants: Secondary | ICD-10-CM | POA: Diagnosis not present

## 2022-12-18 NOTE — Progress Notes (Signed)
Patient Care Team: Margaree Mackintosh, MD as PCP - General (Internal Medicine) Lennette Bihari, MD as PCP - Cardiology (Cardiology) Levert Feinstein, MD as Consulting Physician (Oncology) Bufford Buttner, MD as Consulting Physician (Nephrology)  Visit Date: 12/18/22  Subjective:    Patient ID: Roberto Reid , Male   DOB: 1932-08-23, 89 y.o.    MRN: [id number]   89 y.o. Male presents today for a medication refill.   He is feeling well regarding general health and has no new complaints.  History of nonrheumatic aortic valve stenosis. Aortic valve remained stable on 10/07/22 echo. Followed by Dr. Nicki Guadalajara, cardiologist. He is on low-dose Eliquis at 2.5 mg twice a day in light of his age and renal function.   He has a history of hypertension and has been on amlodipine 5 mg daily in addition to losartan 100 mg daily. Blood pressure normal today at 108/60.  Takes Klonopin for anxiety.  He had last chronic pain visit on September 15, 2022 and is here today for another chronic pain visit follow-up.   In December he was seen for laceration and infection of his second toe treated with Rocephin and Levaquin.  He had fever of 101 degrees.  History of chronic foot pain and has a chronic foot deformity that is not amenable to surgery at his age.  He takes chronic narcotic pain medication for foot pain.  He takes his medication reliably and responsibly.   Seen here for diarrhea, abdominal pain on 09/03/22.  He submitted the stool study that was positive for C. difficile toxin.  He was treated with Clindamycin and this has mostly resolved. Having formed stools twice daily on average. Using Miralax.   History of chronic musculoskeletal pain secondary to right foot deformity treated with hydrocodone-acetaminophen 10-325 mg every 6-8 hours as needed.  Chronic pain medication has been helping to keep him active.   History of GERD treated with esomeprazole 40 mg twice daily.   Past Medical History:   Diagnosis Date   Arthritis    "right hand" (06/15/2016)   Childhood asthma    Chronic anticoagulation 08/30/2017   DVT, lower extremity, proximal, acute, right (HCC) 08/24/2016   06/15/16   GERD (gastroesophageal reflux disease)    Hearing loss    History of kidney stones    Hyperlipidemia    Hypertension    Pneumonia    "I've had it ~ 9 times as an adult; last time was 04/2016" (06/15/2016)     Family History  Problem Relation Age of Onset   Parkinson's disease Father     Social History   Social History Narrative   Lives at home with wife   Right handed   Caffeine: 2 cups/day      Review of Systems  Constitutional:  Negative for fever and malaise/fatigue.  HENT:  Negative for congestion.   Eyes:  Negative for blurred vision.  Respiratory:  Negative for cough and shortness of breath.   Cardiovascular:  Negative for chest pain, palpitations and leg swelling.  Gastrointestinal:  Negative for vomiting.  Musculoskeletal:  Negative for back pain.  Skin:  Negative for rash.  Neurological:  Negative for loss of consciousness and headaches.        Objective:   Vitals: BP 108/60   Pulse 69   Resp 16   Wt 165 lb 12 oz (75.2 kg)   SpO2 96%   BMI 23.78 kg/m    Physical Exam Constitutional:  General: He is not in acute distress.    Appearance: Normal appearance. He is not ill-appearing.  HENT:     Head: Normocephalic and atraumatic.  Cardiovascular:     Rate and Rhythm: Normal rate and regular rhythm.     Pulses: Normal pulses.     Heart sounds: Normal heart sounds. No murmur heard.    No friction rub. No gallop.  Pulmonary:     Effort: Pulmonary effort is normal. No respiratory distress.     Breath sounds: Normal breath sounds. No wheezing or rales.  Feet:     Comments: No change in right foot deformity.  No open lesions or drainage of right foot. He is seen in no acute distress. Skin:    General: Skin is warm and dry.  Neurological:     Mental Status:  He is alert and oriented to person, place, and time. Mental status is at baseline.  Psychiatric:        Mood and Affect: Mood normal.        Behavior: Behavior normal.        Thought Content: Thought content normal.        Judgment: Judgment normal.       Results:   Studies obtained and personally reviewed by me:   Labs:       Component Value Date/Time   NA 139 09/03/2022 1147   NA 138 08/23/2017 1101   K 4.6 09/03/2022 1147   CL 105 09/03/2022 1147   CO2 25 09/03/2022 1147   GLUCOSE 93 09/03/2022 1147   BUN 34 (H) 09/03/2022 1147   BUN 34 (H) 08/23/2017 1101   CREATININE 1.74 (H) 09/03/2022 1147   CALCIUM 8.9 09/03/2022 1147   PROT 6.4 09/03/2022 1147   ALBUMIN 3.5 08/24/2016 1519   AST 9 (L) 09/03/2022 1147   ALT 6 (L) 09/03/2022 1147   ALKPHOS 33 (L) 08/24/2016 1519   BILITOT 1.1 09/03/2022 1147   GFRNONAA 44 (L) 05/07/2020 1635   GFRAA 51 (L) 05/07/2020 1635     Lab Results  Component Value Date   WBC 6.3 09/15/2022   HGB 11.6 (L) 09/15/2022   HCT 34.4 (L) 09/15/2022   MCV 98.0 09/15/2022   PLT 220 09/15/2022    Lab Results  Component Value Date   CHOL 140 03/03/2022   HDL 46 03/03/2022   LDLCALC 78 03/03/2022   TRIG 75 03/03/2022   CHOLHDL 3.0 03/03/2022    Lab Results  Component Value Date   HGBA1C 5.3 03/03/2022     Lab Results  Component Value Date   TSH 3.79 04/07/2022     Lab Results  Component Value Date   PSA 0.42 03/03/2022   PSA 0.44 02/21/2021   PSA 0.45 02/19/2020      Assessment & Plan:   Chronic pain management: Continue hydrocodone APAP 10/325 1 tablet every 6-8 hours for pain (#90) with no refill.  This prescription generally last 30 days.   Hypertension: treated with amlodipine 5 mg daily, losartan 100 mg daily. Blood pressure normal today at 108/60.  GERD: stable with esomeprazole 40 mg twice daily.    Chronic kidney disease- stable   History of pulmonary embolism 2017 treated with Eliquis 2.5 mg twice daily.   Recommended chronic anticoagulation indefinitely  Chronic testosterone treatment-AndroGel-7.5 g (6 pumps) topically each day as directed.  [facility] saw NP at [facility]   Health maintenance exam and Medicare wellness visit due in October   C. difficile infection-recently treated  in early April   Chronic insomnia treated with Ambien and Seroquel  Hyperlipidemia treated with Lipitor   Low testosterone treated with testosterone topical gel   Adenomatous colon polyp 2008   History of Barrett's esophagus   History of uric acid stones 2007 treated with Dr. Holly Bodily   Left knee surgery done by Dr. Rennis Chris for torn cartilage 2005.  Left knee arthroplasty 2013.   History of recurrent cellulitis of the leg   Folate deficiency-I prescribed Folvite 1 mg daily.  He should take this indefinitely.  Iron level is normal.   Return in October for next pain management visit and urine drug screening.  CPE due in October.    I,Alexander Ruley,acting as a Neurosurgeon for Margaree Mackintosh, MD.,have documented all relevant documentation on the behalf of Margaree Mackintosh, MD,as directed by  Margaree Mackintosh, MD while in the presence of Margaree Mackintosh, MD.   I, Margaree Mackintosh, MD, have reviewed all documentation for this visit. The documentation on 12/28/22 for the exam, diagnosis, procedures, and orders are all accurate and complete.

## 2022-12-24 ENCOUNTER — Other Ambulatory Visit: Payer: Self-pay

## 2022-12-24 DIAGNOSIS — I2782 Chronic pulmonary embolism: Secondary | ICD-10-CM

## 2022-12-24 DIAGNOSIS — I824Y1 Acute embolism and thrombosis of unspecified deep veins of right proximal lower extremity: Secondary | ICD-10-CM

## 2022-12-24 DIAGNOSIS — N189 Chronic kidney disease, unspecified: Secondary | ICD-10-CM

## 2022-12-24 MED ORDER — APIXABAN 2.5 MG PO TABS
2.5000 mg | ORAL_TABLET | Freq: Two times a day (BID) | ORAL | 1 refills | Status: DC
Start: 2022-12-24 — End: 2023-07-12

## 2022-12-28 NOTE — Patient Instructions (Signed)
He takes his chronic pain medication responsibly and reliably. RTC in October for chronic pain med visit, medicare wellness and CPE.

## 2023-01-06 DIAGNOSIS — Z85828 Personal history of other malignant neoplasm of skin: Secondary | ICD-10-CM | POA: Diagnosis not present

## 2023-01-06 DIAGNOSIS — C44219 Basal cell carcinoma of skin of left ear and external auricular canal: Secondary | ICD-10-CM | POA: Diagnosis not present

## 2023-01-06 DIAGNOSIS — Z08 Encounter for follow-up examination after completed treatment for malignant neoplasm: Secondary | ICD-10-CM | POA: Diagnosis not present

## 2023-01-18 ENCOUNTER — Other Ambulatory Visit: Payer: Self-pay | Admitting: Internal Medicine

## 2023-01-18 MED ORDER — HYDROCODONE-ACETAMINOPHEN 10-325 MG PO TABS: ORAL_TABLET | ORAL | 0 refills | Status: DC

## 2023-01-18 NOTE — Telephone Encounter (Signed)
Ona Sedgwick [phone]  [name] called to say he needs refill on below medication.  HYDROcodone-acetaminophen [facility]) 10-325 MG tablet   [facility] [id number] [place], [place] - [address] Phone: [phone]  Fax: [phone]

## 2023-01-18 NOTE — Telephone Encounter (Signed)
Last refill 12/17/2022 #90

## 2023-01-28 ENCOUNTER — Other Ambulatory Visit: Payer: Self-pay

## 2023-01-28 MED ORDER — ZOLPIDEM TARTRATE 10 MG PO TABS: 10.0000 mg | ORAL_TABLET | Freq: Every day | ORAL | 0 refills | Status: DC

## 2023-01-28 NOTE — Telephone Encounter (Signed)
Received rx refill from pharmacy. Ambien 10mg , last filled on 08/02/22 #90 + refill

## 2023-02-17 ENCOUNTER — Ambulatory Visit: Payer: Medicare Other | Admitting: Internal Medicine

## 2023-02-18 ENCOUNTER — Telehealth: Payer: Self-pay | Admitting: Internal Medicine

## 2023-02-18 NOTE — Telephone Encounter (Addendum)
Herlin Cropley [phone]  [name] called to say it is time for a refill on below medication.  HYDROcodone-acetaminophen [facility]) 10-325 MG tablet   [facility] [id number] [place], [place] - [address] Phone: [phone]  Fax: [phone]      This prescription has benn refilled as requested. He takes his medication responsibly. He has an appt for CPE labs and wellness visit in October. MJB, MD

## 2023-02-19 ENCOUNTER — Encounter: Payer: Self-pay | Admitting: Internal Medicine

## 2023-02-19 MED ORDER — HYDROCODONE-ACETAMINOPHEN 10-325 MG PO TABS: ORAL_TABLET | ORAL | 0 refills | Status: DC

## 2023-02-25 ENCOUNTER — Other Ambulatory Visit: Payer: Self-pay

## 2023-02-25 MED ORDER — QUETIAPINE FUMARATE 50 MG PO TABS: 50.0000 mg | ORAL_TABLET | Freq: Every day | ORAL | 0 refills | Status: DC

## 2023-02-25 NOTE — Telephone Encounter (Signed)
Last refill 11/30/22 #90

## 2023-03-01 ENCOUNTER — Other Ambulatory Visit: Payer: Medicare Other

## 2023-03-02 NOTE — Progress Notes (Shared)
Annual Wellness Visit    Patient Care Team: [name], Luanna Cole, MD as PCP - General (Internal Medicine) Lennette Bihari, MD as PCP - Cardiology (Cardiology) Levert Feinstein, MD as Consulting Physician (Oncology) Bufford Buttner, MD as Consulting Physician (Nephrology)  Visit Date: 03/02/23   No chief complaint on file.   Subjective:   Patient: Roberto Reid, Male    DOB: April 30, 1933, 89 y.o.   MRN: [id number]  Roberto Reid is a 89 y.o. Male who presents today for his Annual Wellness Visit. History of arthritis, chronic anticoagulation, DVT, GERD, hearing loss, kidney stones, hyperlipidemia, hypertension, pneumonia.  History of hypertension treated with amlodipine 5 mg daily, losartan 100 mg daily.  History of anxiety treated with clonazepam 0.5 - 1 mg daily as needed.  History of GERD treated with esomeprazole 40 mg twice daily.   He has mild hypertension treated with amlodipine and losartan.  He does have some Klonopin to take for anxiety if needed.  He takes Seroquel for sleep as well as Ambien.  Despite his age he seems to tolerate all of these medicines quite well.  He realizes he is not quite as busy as he used to be and he may be a bit lonely is with his wife now working part-time.   He is on chronic anticoagulation with history of DVT.  He takes hydrocodone APAP 10/325 every 6-8 hours as needed for chronic pain.  He has a history of mild glucose intolerance.  History of GE reflux, hyperlipidemia, chronic insomnia, low testosterone.   History of Barrett's esophagus and adenomatous colon polyp diagnosed in 2008.  Right kidney stone in 1997 and in 2007.  History of uric acid stones 2007 treated by Dr. Annabell Howells.  History of hearing loss.  Cardiolite study in 2008 by Dr. Riley Kill was negative.  He had left lower lobe pneumonia in 2017, was hospitalized and was found to have pulmonary embolus treated with Eliquis.  Hematologist advised him to continue with anticoagulation  indefinitely.  Had left knee arthroplasty in 2013.   Vaccine counseling: UTD on tetanus vaccine.  Past Medical History:  Diagnosis Date   Arthritis    "right hand" (06/15/2016)   Childhood asthma    Chronic anticoagulation 08/30/2017   DVT, lower extremity, proximal, acute, right (HCC) 08/24/2016   06/15/16   GERD (gastroesophageal reflux disease)    Hearing loss    History of kidney stones    Hyperlipidemia    Hypertension    Pneumonia    "I've had it ~ 9 times as an adult; last time was 04/2016" (06/15/2016)     Family History  Problem Relation Age of Onset   Parkinson's disease Father      Social History   Social History Narrative   Lives at home with wife   Right handed   Caffeine: 2 cups/day     ROS    Objective:   Vitals: There were no vitals taken for this visit.  Physical Exam   Most recent functional status assessment:    03/04/2022   11:20 AM  In your present state of health, do you have any difficulty performing the following activities:  Hearing? 1  Vision? 0  Difficulty concentrating or making decisions? 0  Walking or climbing stairs? 1  Dressing or bathing? 0  Doing errands, shopping? 0  Preparing Food and eating ? N  Using the Toilet? N  In the past six months, have you accidently leaked urine? N  Do you have problems with loss of bowel control? N  Managing your Medications? N  Managing your Finances? N  Housekeeping or managing your Housekeeping? N   Most recent fall risk assessment:    09/15/2022   11:35 AM  Fall Risk   Falls in the past year? 0  Number falls in past yr: 0  Injury with Fall? 0  Risk for fall due to : No Fall Risks  Follow up Falls prevention discussed    Most recent depression screenings:    09/15/2022   11:35 AM 09/03/2022   12:05 PM  PHQ 2/9 Scores  PHQ - 2 Score 0 0   Most recent cognitive screening:    03/04/2022   11:21 AM  6CIT Screen  What Year? 0 points  What month? 0 points  What time? 0 points   Count back from 20 0 points  Months in reverse 0 points  Repeat phrase 0 points  Total Score 0 points     Results:   Studies obtained and personally reviewed by me:  Imaging, colonoscopy, mammogram, bone density scan, echocardiogram, heart cath, stress test, CT calcium score, etc. ***   Labs:       Component Value Date/Time   NA 139 09/03/2022 1147   NA 138 08/23/2017 1101   K 4.6 09/03/2022 1147   CL 105 09/03/2022 1147   CO2 25 09/03/2022 1147   GLUCOSE 93 09/03/2022 1147   BUN 34 (H) 09/03/2022 1147   BUN 34 (H) 08/23/2017 1101   CREATININE 1.74 (H) 09/03/2022 1147   CALCIUM 8.9 09/03/2022 1147   PROT 6.4 09/03/2022 1147   ALBUMIN 3.5 08/24/2016 1519   AST 9 (L) 09/03/2022 1147   ALT 6 (L) 09/03/2022 1147   ALKPHOS 33 (L) 08/24/2016 1519   BILITOT 1.1 09/03/2022 1147   GFRNONAA 44 (L) 05/07/2020 1635   GFRAA 51 (L) 05/07/2020 1635     Lab Results  Component Value Date   WBC 6.3 09/15/2022   HGB 11.6 (L) 09/15/2022   HCT 34.4 (L) 09/15/2022   MCV 98.0 09/15/2022   PLT 220 09/15/2022    Lab Results  Component Value Date   CHOL 140 03/03/2022   HDL 46 03/03/2022   LDLCALC 78 03/03/2022   TRIG 75 03/03/2022   CHOLHDL 3.0 03/03/2022    Lab Results  Component Value Date   HGBA1C 5.3 03/03/2022     Lab Results  Component Value Date   TSH 3.79 04/07/2022     Lab Results  Component Value Date   PSA 0.42 03/03/2022   PSA 0.44 02/21/2021   PSA 0.45 02/19/2020   *** delete for male pts  ***  Assessment & Plan:   ***      Annual wellness visit done today including the all of the following: Reviewed patient's Family Medical History Reviewed and updated list of patient's medical providers Assessment of cognitive impairment was done Assessed patient's functional ability Established a written schedule for health screening services Health Risk Assessent Completed and Reviewed  Discussed health benefits of physical activity, and encouraged  him to engage in regular exercise appropriate for his age and condition.        I,Alexander Ruley,acting as a Neurosurgeon for Margaree Mackintosh, MD.,have documented all relevant documentation on the behalf of Margaree Mackintosh, MD,as directed by  Margaree Mackintosh, MD while in the presence of Margaree Mackintosh, MD.   ***

## 2023-03-03 ENCOUNTER — Other Ambulatory Visit: Payer: Self-pay

## 2023-03-03 MED ORDER — QUETIAPINE FUMARATE 50 MG PO TABS: 50.0000 mg | ORAL_TABLET | Freq: Every day | ORAL | 0 refills | Status: DC

## 2023-03-04 ENCOUNTER — Telehealth (INDEPENDENT_AMBULATORY_CARE_PROVIDER_SITE_OTHER): Payer: Medicare Other | Admitting: Internal Medicine

## 2023-03-04 ENCOUNTER — Encounter: Payer: Self-pay | Admitting: Internal Medicine

## 2023-03-04 ENCOUNTER — Telehealth: Payer: Self-pay | Admitting: Internal Medicine

## 2023-03-04 ENCOUNTER — Other Ambulatory Visit: Payer: Medicare Other

## 2023-03-04 VITALS — BP 114/81 | Temp 98.1°F

## 2023-03-04 DIAGNOSIS — Z0000 Encounter for general adult medical examination without abnormal findings: Secondary | ICD-10-CM

## 2023-03-04 DIAGNOSIS — U071 COVID-19: Secondary | ICD-10-CM

## 2023-03-04 DIAGNOSIS — Z7901 Long term (current) use of anticoagulants: Secondary | ICD-10-CM

## 2023-03-04 DIAGNOSIS — N189 Chronic kidney disease, unspecified: Secondary | ICD-10-CM

## 2023-03-04 DIAGNOSIS — I10 Essential (primary) hypertension: Secondary | ICD-10-CM

## 2023-03-04 DIAGNOSIS — E7439 Other disorders of intestinal carbohydrate absorption: Secondary | ICD-10-CM

## 2023-03-04 DIAGNOSIS — M2141 Flat foot [pes planus] (acquired), right foot: Secondary | ICD-10-CM

## 2023-03-04 DIAGNOSIS — Z125 Encounter for screening for malignant neoplasm of prostate: Secondary | ICD-10-CM

## 2023-03-04 DIAGNOSIS — N1832 Chronic kidney disease, stage 3b: Secondary | ICD-10-CM

## 2023-03-04 DIAGNOSIS — E119 Type 2 diabetes mellitus without complications: Secondary | ICD-10-CM

## 2023-03-04 MED ORDER — NIRMATRELVIR/RITONAVIR (PAXLOVID) TABLET (RENAL DOSING): 2.0000 | ORAL_TABLET | Freq: Two times a day (BID) | ORAL | 0 refills | Status: AC

## 2023-03-04 NOTE — Progress Notes (Signed)
Patient Care Team: Margaree Mackintosh, MD as PCP - General (Internal Medicine) Lennette Bihari, MD as PCP - Cardiology (Cardiology) Levert Feinstein, MD as Consulting Physician (Oncology) Bufford Buttner, MD as Consulting Physician (Nephrology)  I connected with Roberto Reid on 03/04/23 at 4:50 PM by video enabled telemedicine visit and verified that I am speaking with the correct person using two identifiers.   I discussed the limitations, risks, security and privacy concerns of performing an evaluation and management service by telemedicine and the availability of in-person appointments. I also discussed with the patient that there may be a patient responsible charge related to this service. The patient expressed understanding and agreed to proceed.   Other persons participating in the visit and their role in the encounter: Medical scribe, Doylene Bode  Patient's location: Home  Provider's location: Clinic   I provided 10 minutes of face-to-face video visit time during this encounter, and > 50% was spent counseling as documented under my assessment & plan. He is identified using two identifiers, Roberto Reid, a patient of this practice. He is in his house and I am in my practice. He is agreeable to using this format today.  Chief Complaint: chest congestion, rhinorrhea   Subjective:    Patient ID: Roberto Reid , Male    DOB: September 17, 1932, 89 y.o.    MRN: [id number]   89 y.o. Male presents today for rhinorrhea, chest congestion since 03/03/23. He returned from a wedding on 03/01/23. Denies fever, cough, headache, sputum production, diarrhea, vomiting, shortness of breath. Reports positive at-home Covid-19 test.  Past Medical History:  Diagnosis Date   Arthritis    "right hand" (06/15/2016)   Childhood asthma    Chronic anticoagulation 08/30/2017   DVT, lower extremity, proximal, acute, right (HCC) 08/24/2016   06/15/16   GERD (gastroesophageal reflux disease)    Hearing  loss    History of kidney stones    Hyperlipidemia    Hypertension    Pneumonia    "I've had it ~ 9 times as an adult; last time was 04/2016" (06/15/2016)     Family History  Problem Relation Age of Onset   Parkinson's disease Father     Social History   Social History Narrative   Lives at home with wife   Right handed   Caffeine: 2 cups/day      Review of Systems  Constitutional:  Negative for fever and malaise/fatigue.  HENT:  Positive for congestion (Chest).        (+) Rhinorrhea  Eyes:  Negative for blurred vision.  Respiratory:  Negative for cough and shortness of breath.   Cardiovascular:  Negative for chest pain, palpitations and leg swelling.  Gastrointestinal:  Negative for nausea and vomiting.  Musculoskeletal:  Negative for back pain.  Skin:  Negative for rash.  Neurological:  Negative for loss of consciousness and headaches.        Objective:   Vitals: BP 114/81   Temp 98.1 F (36.7 C)   SpO2 95%    Physical Exam Vitals and nursing note reviewed.  Constitutional:      General: He is not in acute distress.    Appearance: Normal appearance. He is not ill-appearing.  HENT:     Head: Normocephalic and atraumatic.  Pulmonary:     Effort: Pulmonary effort is normal.  Skin:    General: Skin is warm and dry.  Neurological:     Mental Status: He is alert and oriented to  person, place, and time. Mental status is at baseline.  Psychiatric:        Mood and Affect: Mood normal.        Behavior: Behavior normal.        Thought Content: Thought content normal.        Judgment: Judgment normal.       Results:   Studies obtained and personally reviewed by me:   Labs:       Component Value Date/Time   NA 139 09/03/2022 1147   NA 138 08/23/2017 1101   K 4.6 09/03/2022 1147   CL 105 09/03/2022 1147   CO2 25 09/03/2022 1147   GLUCOSE 93 09/03/2022 1147   BUN 34 (H) 09/03/2022 1147   BUN 34 (H) 08/23/2017 1101   CREATININE 1.74 (H) 09/03/2022  1147   CALCIUM 8.9 09/03/2022 1147   PROT 6.4 09/03/2022 1147   ALBUMIN 3.5 08/24/2016 1519   AST 9 (L) 09/03/2022 1147   ALT 6 (L) 09/03/2022 1147   ALKPHOS 33 (L) 08/24/2016 1519   BILITOT 1.1 09/03/2022 1147   GFRNONAA 44 (L) 05/07/2020 1635   GFRAA 51 (L) 05/07/2020 1635     Lab Results  Component Value Date   WBC 6.3 09/15/2022   HGB 11.6 (L) 09/15/2022   HCT 34.4 (L) 09/15/2022   MCV 98.0 09/15/2022   PLT 220 09/15/2022    Lab Results  Component Value Date   CHOL 140 03/03/2022   HDL 46 03/03/2022   LDLCALC 78 03/03/2022   TRIG 75 03/03/2022   CHOLHDL 3.0 03/03/2022    Lab Results  Component Value Date   HGBA1C 5.3 03/03/2022     Lab Results  Component Value Date   TSH 3.79 04/07/2022     Lab Results  Component Value Date   PSA 0.42 03/03/2022   PSA 0.44 02/21/2021   PSA 0.45 02/19/2020      Assessment & Plan:   Acute Covid-19 infection: GFR at 37 on 09/03/22. Prescribed Paxlovid renal strength. Directed to take Eliquis every other day while taking Paxlovid. Advised to quarantine for 5 days. Get plenty of rest, stay well-hydrated and walk regularly. Contact us if new symptoms develop or if current symptoms worsen or do not improve.  Monitor pulse oximtery and call us for shortness of breath or other concerns.    I,Alexander Ruley,acting as a Neurosurgeon for Margaree Mackintosh, MD.,have documented all relevant documentation on the behalf of Margaree Mackintosh, MD,as directed by  Margaree Mackintosh, MD while in the presence of Margaree Mackintosh, MD.   I, Margaree Mackintosh, MD, have reviewed all documentation for this visit. The documentation on 03/08/23 for the exam, diagnosis, procedures, and orders are all accurate and complete.

## 2023-03-04 NOTE — Telephone Encounter (Signed)
Roberto Reid [phone]  [facility] called to say they had been out of town and [name] started getting sick yesterday with Scratchy throat, nasal congestion, body aches, tired, he is unable to drive and come in today for labs, which I told her was best. I ask her if we needed to treat him. She is going to test him for COVID when she gets home this afternoon, I let her know this was all symptoms. She did say he had his last vaccine about 2 1/2 weeks before they went out of town. She will call back with test results.

## 2023-03-04 NOTE — Telephone Encounter (Signed)
+  COVID, set up video visit

## 2023-03-08 ENCOUNTER — Encounter: Payer: Self-pay | Admitting: Internal Medicine

## 2023-03-08 NOTE — Patient Instructions (Signed)
We are sorry to hear that you are ill with COVID-19.  Prescribed renal strength Paxlovid.  Take Eliquis every other day while you are taking Paxlovid.  Quarantine for 5 days.  Get plenty of rest.  Stay well-hydrated and walk some to prevent atelectasis of the lungs.  Call us if new symptoms develop or if current symptoms worsen or not improving.  Monitor pulse oximetry and call us for shortness of breath or any other concerns whatsoever.

## 2023-03-09 ENCOUNTER — Ambulatory Visit: Payer: Medicare Other | Admitting: Internal Medicine

## 2023-03-09 ENCOUNTER — Telehealth: Payer: Self-pay | Admitting: Internal Medicine

## 2023-03-09 NOTE — Telephone Encounter (Signed)
Rodel Glaspy [phone]  [name] called to see if you would possible call him in some prednisone, he said he was struggling thru this COVID.

## 2023-03-09 NOTE — Telephone Encounter (Signed)
[name] is now 97%

## 2023-03-09 NOTE — Telephone Encounter (Signed)
Called Roberto Reid, he has chest congestion a cough, nothing is coming out, no wheezing. [name] was 91% while I was on phone with him.

## 2023-03-09 NOTE — Telephone Encounter (Signed)
scheduled

## 2023-03-09 NOTE — Telephone Encounter (Signed)
Called Roberto Reid, he said he does not have pneumonia yet, and is not going to go to [facility] ED, He is going to do his [name] and call back, he said it had never been that low before.

## 2023-03-10 ENCOUNTER — Ambulatory Visit: Payer: Medicare Other | Admitting: Internal Medicine

## 2023-03-10 ENCOUNTER — Encounter: Payer: Self-pay | Admitting: Internal Medicine

## 2023-03-10 ENCOUNTER — Ambulatory Visit
Admission: RE | Admit: 2023-03-10 | Discharge: 2023-03-10 | Disposition: A | Discharge status: Home / Self Care | Payer: Medicare Other | Source: Ambulatory Visit | Attending: Internal Medicine | Admitting: Internal Medicine

## 2023-03-10 VITALS — BP 120/60 | HR 96 | Temp 98.1°F | Ht 70.0 in | Wt 150.0 lb

## 2023-03-10 DIAGNOSIS — R0989 Other specified symptoms and signs involving the circulatory and respiratory systems: Secondary | ICD-10-CM | POA: Diagnosis not present

## 2023-03-10 DIAGNOSIS — R5381 Other malaise: Secondary | ICD-10-CM

## 2023-03-10 DIAGNOSIS — N1832 Chronic kidney disease, stage 3b: Secondary | ICD-10-CM

## 2023-03-10 DIAGNOSIS — E7439 Other disorders of intestinal carbohydrate absorption: Secondary | ICD-10-CM

## 2023-03-10 DIAGNOSIS — Z7901 Long term (current) use of anticoagulants: Secondary | ICD-10-CM

## 2023-03-10 DIAGNOSIS — I10 Essential (primary) hypertension: Secondary | ICD-10-CM | POA: Diagnosis not present

## 2023-03-10 DIAGNOSIS — R5383 Other fatigue: Secondary | ICD-10-CM

## 2023-03-10 DIAGNOSIS — Z86711 Personal history of pulmonary embolism: Secondary | ICD-10-CM

## 2023-03-10 DIAGNOSIS — U071 COVID-19: Secondary | ICD-10-CM | POA: Diagnosis not present

## 2023-03-10 MED ORDER — CEFTRIAXONE SODIUM 1 G IJ SOLR
1.0000 g | Freq: Once | INTRAMUSCULAR | Status: AC
Start: 2023-03-10 — End: 2023-03-10
  Administered 2023-03-10: 1 g via INTRAMUSCULAR

## 2023-03-10 NOTE — Progress Notes (Unsigned)
She is  Subjective:    Patient ID: Roberto Reid, male    DOB: 1932-10-18, 89 y.o.   MRN: [id number]  HPI Roberto Reid presents today with cough and respiratory congestion.  Roberto Reid called yesterday about possibly needing antibiotics and advised him to come in today to check for pneumonia.  Roberto Reid had a video visit on October 3 when Roberto Reid first found out Roberto Reid had COVID-19 via home testing.  Roberto Reid had traveled via airplane to [place] for a wedding during the weekend of September 28.  Subsequently after returning home, both Roberto Reid and his wife came down with COVID-19.       Review of Systems     Objective:   Physical Exam        Assessment & Plan:

## 2023-03-25 ENCOUNTER — Telehealth: Payer: Self-pay | Admitting: Internal Medicine

## 2023-03-25 MED ORDER — SILDENAFIL CITRATE 100 MG PO TABS
50.0000 mg | ORAL_TABLET | Freq: Every day | ORAL | 1 refills | Status: DC | PRN
Start: 2023-03-25 — End: 2023-06-03

## 2023-03-25 NOTE — Patient Instructions (Addendum)
Chest x-ray shows no evidence of pneumonia.  He was given 1 g IM Rocephin in the office.  Continue to monitor symptoms.  Stay well-hydrated and rest.

## 2023-03-25 NOTE — Telephone Encounter (Signed)
Patient notified, refill sent.

## 2023-03-25 NOTE — Telephone Encounter (Signed)
Patient called and said he was late to his eye doctor appointment at [facility] ophthalmology  and they rescheduled him until 06/10/23. Patient wanted to know if Dr Lenord Fellers could help him because he's having issues with his eyes. He also asked if Dr Lenord Fellers could refill his viagra

## 2023-04-05 ENCOUNTER — Encounter: Payer: Self-pay | Admitting: Internal Medicine

## 2023-04-05 ENCOUNTER — Telehealth: Payer: Self-pay | Admitting: Internal Medicine

## 2023-04-05 MED ORDER — HYDROCODONE-ACETAMINOPHEN 10-325 MG PO TABS: ORAL_TABLET | ORAL | 0 refills | Status: DC

## 2023-04-05 NOTE — Telephone Encounter (Addendum)
Roberto Reid [phone]  Roberto Reid needs a refill on below mediation, he is down to 3 pills last time he got a refill was in September.  HYDROcodone-acetaminophen [facility]) 10-325 MG tablet   [facility] [id number] [place], [place] - [address] Phone: [phone]  Fax: [phone]      This Rx has been refilled today. MJB, MD

## 2023-04-27 ENCOUNTER — Other Ambulatory Visit: Payer: Self-pay

## 2023-04-28 MED ORDER — ZOLPIDEM TARTRATE 10 MG PO TABS: 10.0000 mg | ORAL_TABLET | Freq: Every day | ORAL | 0 refills | Status: DC

## 2023-05-05 ENCOUNTER — Other Ambulatory Visit: Payer: Self-pay | Admitting: Internal Medicine

## 2023-05-05 ENCOUNTER — Telehealth: Payer: Self-pay | Admitting: Internal Medicine

## 2023-05-05 ENCOUNTER — Ambulatory Visit (INDEPENDENT_AMBULATORY_CARE_PROVIDER_SITE_OTHER): Payer: Medicare Other

## 2023-05-05 DIAGNOSIS — Z0000 Encounter for general adult medical examination without abnormal findings: Secondary | ICD-10-CM

## 2023-05-05 DIAGNOSIS — H5213 Myopia, bilateral: Secondary | ICD-10-CM | POA: Diagnosis not present

## 2023-05-05 MED ORDER — HYDROCODONE-ACETAMINOPHEN 10-325 MG PO TABS: ORAL_TABLET | ORAL | 0 refills | Status: DC

## 2023-05-05 NOTE — Progress Notes (Addendum)
Subjective:   Roberto Reid is a 89 y.o. male who presents for Medicare Annual/Subsequent preventive examination.  Visit Complete: Virtual I connected with  Kinnie Scales on 05/05/23 by a audio enabled telemedicine application and verified that I am speaking with the correct person using two identifiers.  Patient Location: Home  Provider Location: Office/Clinic  I discussed the limitations of evaluation and management by telemedicine. The patient expressed understanding and agreed to proceed.  Vital Signs: Because this visit was a virtual/telehealth visit, some criteria may be missing or patient reported. Any vitals not documented were not able to be obtained and vitals that have been documented are patient reported.  Patient Medicare AWV questionnaire was completed by the patient on 05/05/23; I have confirmed that all information answered by patient is correct and no changes since this date.  Cardiac Risk Factors include: advanced age (>15men, >58 women);dyslipidemia;diabetes mellitus;male gender;hypertension     Objective:    There were no vitals filed for this visit. There is no height or weight on file to calculate BMI.     05/05/2023   11:32 AM 03/04/2022   11:20 AM 02/27/2021    2:47 PM 08/16/2020   11:47 AM 08/09/2020   12:01 PM 09/27/2018    6:29 AM 09/23/2018   11:01 AM  Advanced Directives  Does Patient Have a Medical Advance Directive? Yes Yes No Yes Yes Yes Yes  Type of Advance Directive Living will;Healthcare Power of [employer] Power of [place];Living will    Living will;Healthcare Power of [employer] Power of [place];Living will  Does patient want to make changes to medical advance directive?  No - Patient declined  No - Patient declined     Copy of Healthcare Power of Attorney in Chart?  No - copy requested   No - copy requested    Would patient like information on creating a medical advance directive?   No - Patient declined         Current Medications (verified) Outpatient Encounter Medications as of 05/05/2023  Medication Sig   amLODipine (NORVASC) 5 MG tablet TAKE ONE TABLET BY MOUTH DAILY   apixaban (ELIQUIS) 2.5 MG TABS tablet Take 1 tablet (2.5 mg total) by mouth 2 (two) times daily.   clonazePAM (KLONOPIN) 0.5 MG tablet TAKE 1 TO 2 TABLETS BY MOUTH DAILY AS NEEDED FOR ANXIETY   Coenzyme Q10 (CO Q-10 PO) Take 1 tablet by mouth daily. 300 mg   esomeprazole (NEXIUM) 40 MG capsule Take 1 capsule (40 mg total) by mouth 2 (two) times daily.   folic acid (FOLVITE) 1 MG tablet Take 1 tablet (1 mg total) by mouth daily.   HYDROcodone-acetaminophen (NORCO) 10-325 MG tablet One tab by mouth every 6-8 hours  for chronic pain   loratadine (CLARITIN) 10 MG tablet Take 10 mg by mouth daily as needed for allergies.   losartan (COZAAR) 100 MG tablet TAKE ONE TABLET BY MOUTH DAILY   lubiprostone (AMITIZA) 24 MCG capsule Take 1 capsule (24 mcg total) by mouth 2 (two) times daily with a meal.   Multiple Vitamins-Minerals (MULTIVITAMIN WITH MINERALS) tablet Take 1 tablet by mouth daily.   mupirocin cream (BACTROBAN) 2 % Apply 1 Application topically 2 (two) times daily.   Omega-3 Fatty Acids (OMEGA 3 PO) Take by mouth.   ondansetron (ZOFRAN) 4 MG tablet Take 1 tablet (4 mg total) by mouth every 8 (eight) hours as needed for nausea or vomiting.   QUEtiapine (SEROQUEL) 50 MG tablet Take 1  tablet (50 mg total) by mouth at bedtime.   sildenafil (VIAGRA) 100 MG tablet Take 0.5-1 tablets (50-100 mg total) by mouth daily as needed for erectile dysfunction.   tamsulosin (FLOMAX) 0.4 MG CAPS capsule Take 1 capsule (0.4 mg total) by mouth daily.   Testosterone 20.25 MG/ACT (1.62%) GEL 20.25 mg by Pump Prime route daily. Apply 6 pumps everyday as directed.   zolpidem (AMBIEN) 10 MG tablet Take 1 tablet (10 mg total) by mouth at bedtime.   No facility-administered encounter medications on file as of 05/05/2023.    Allergies  (verified) Oxycodone-acetaminophen, Percocet [oxycodone-acetaminophen], Sulfa antibiotics, and Sulfamethoxazole   History: Past Medical History:  Diagnosis Date   Arthritis    "right hand" (06/15/2016)   Childhood asthma    Chronic anticoagulation 08/30/2017   DVT, lower extremity, proximal, acute, right (HCC) 08/24/2016   06/15/16   GERD (gastroesophageal reflux disease)    Hearing loss    History of kidney stones    Hyperlipidemia    Hypertension    Pneumonia    "I've had it ~ 9 times as an adult; last time was 04/2016" (06/15/2016)   Past Surgical History:  Procedure Laterality Date   AMPUTATION TOE Right 09/27/2018   Procedure: Right 2nd toe amputation;  Surgeon: Toni Arthurs, MD;  Location: [facility];  Service: Orthopedics;  Laterality: Right;    CARPAL TUNNEL RELEASE Left 08/16/2020   Procedure: LEFT CARPAL TUNNEL RELEASE;  Surgeon: Betha Loa, MD;  Location: [facility];  Service: Orthopedics;  Laterality: Left;   CATARACT EXTRACTION W/ INTRAOCULAR LENS  IMPLANT, BILATERAL Bilateral 04/2009   ELBOW BURSA SURGERY Right    FOOT SURGERY Right 2010   "attempted reconstruction" per pt   JOINT REPLACEMENT     KIDNEY STONE SURGERY     KNEE ARTHROSCOPY Right 10/08/2014   Procedure: ARTHROSCOPY KNEE;  Surgeon: Dannielle Huh, MD;  Location: [facility] OR;  Service: Orthopedics;  Laterality: Right;   KNEE ARTHROSCOPY Left    repair torn knee cartilage   QUADRICEPS TENDON REPAIR Right 10/08/2014   Procedure: REPAIR QUADRICEP TENDON;  Surgeon: Dannielle Huh, MD;  Location: [facility] OR;  Service: Orthopedics;  Laterality: Right;   TONSILLECTOMY     TOTAL KNEE ARTHROPLASTY  12/14/2011   Procedure: TOTAL KNEE ARTHROPLASTY;  Surgeon: Raymon Mutton, MD;  Location: [facility] OR;  Service: Orthopedics;  Laterality: Left;   Family History  Problem Relation Age of Onset   Parkinson's disease Father    Social History   Socioeconomic History   Marital status: Married    Spouse  name: Not on file   Number of children: 2   Years of education: plus some graduate work   Highest education level: Bachelor's degree (e.g., BA, AB, BS)  Occupational History   Not on file  Tobacco Use   Smoking status: Former    Current packs/day: 0.00    Average packs/day: 0.8 packs/day for 37.0 years (27.8 ttl pk-yrs)    Types: Cigarettes    Start date: 12/14/1954    Quit date: 12/14/1991    Years since quitting: 31.4   Smokeless tobacco: Never  Vaping Use   Vaping status: Never Used  Substance and Sexual Activity   Alcohol use: Yes    Alcohol/week: 7.0 - 10.0 standard drinks of alcohol    Types: 7 - 10 Shots of liquor per week    Comment: 2 cocktails daily   Drug use: No   Sexual activity: Yes  Other  Topics Concern   Not on file  Social History Narrative   Lives at home with wife   Right handed   Caffeine: 2 cups/day   Social Determinants of Health   Financial Resource Strain: Low Risk  (05/05/2023)   Overall Financial Resource Strain (CARDIA)    Difficulty of Paying Living Expenses: Not hard at all  Food Insecurity: No Food Insecurity (05/05/2023)   Hunger Vital Sign    Worried About Running Out of Food in the Last Year: Never true    Ran Out of Food in the Last Year: Never true  Transportation Needs: No Transportation Needs (05/05/2023)   PRAPARE - Administrator, Civil Service (Medical): No    Lack of Transportation (Non-Medical): No  Physical Activity: Insufficiently Active (05/05/2023)   Exercise Vital Sign    Days of Exercise per Week: 2 days    Minutes of Exercise per Session: 20 min  Stress: No Stress Concern Present (05/05/2023)   [employer] of Occupational Health - Occupational Stress Questionnaire    Feeling of Stress : Not at all  Social Connections: Socially Integrated (05/05/2023)   Social Connection and Isolation Panel [NHANES]    Frequency of Communication with Friends and Family: Three times a week    Frequency of Social Gatherings  with Friends and Family: Twice a week    Attends Religious Services: 1 to 4 times per year    Active Member of [employer] or Organizations: Yes    Attends Engineer, structural: More than 4 times per year    Marital Status: Married    Tobacco Counseling Counseling given: Not Answered   Clinical Intake:                        Activities of Daily Living    05/05/2023   11:27 AM  In your present state of health, do you have any difficulty performing the following activities:  Hearing? 0  Vision? 0  Difficulty concentrating or making decisions? 0  Walking or climbing stairs? 1  Dressing or bathing? 0  Doing errands, shopping? 0  Preparing Food and eating ? N  Using the Toilet? N  In the past six months, have you accidently leaked urine? N  Do you have problems with loss of bowel control? N  Managing your Medications? N  Managing your Finances? N  Housekeeping or managing your Housekeeping? N    Patient Care Team: Margaree Mackintosh, MD as PCP - General (Internal Medicine) Lennette Bihari, MD as PCP - Cardiology (Cardiology) Levert Feinstein, MD as Consulting Physician (Oncology) Bufford Buttner, MD as Consulting Physician (Nephrology)  Indicate any recent Medical Services you may have received from other than [facility] providers in the past year (date may be approximate).     Assessment:   This is a routine wellness examination for Ty[place].  Hearing/Vision screen No results found.   Goals Addressed   None    Depression Screen    05/05/2023   11:34 AM 09/15/2022   11:35 AM 09/03/2022   12:05 PM 06/30/2022   10:52 AM 06/15/2022   11:45 AM 05/22/2022   11:01 AM 04/06/2022   12:13 PM  PHQ 2/9 Scores  PHQ - 2 Score 0 0 0 0 0 0 1    Fall Risk    05/05/2023   11:30 AM 09/15/2022   11:35 AM 09/03/2022   12:05 PM 06/30/2022   10:52 AM 06/15/2022   11:44  AM  Fall Risk   Falls in the past year? 1 0 0 0 0  Number falls in past yr: 0 0 0 0 0  Injury with  Fall? 0 0 0 0 0  Risk for fall due to : Other (Comment) No Fall Risks No Fall Risks Impaired balance/gait Impaired mobility;Impaired balance/gait  Follow up Falls evaluation completed;Education provided;Falls prevention discussed Falls prevention discussed Falls prevention discussed Falls prevention discussed Falls prevention discussed    MEDICARE RISK AT HOME: Medicare Risk at Home Any stairs in or around the home?: Yes If so, are there any without handrails?: No Home free of loose throw rugs in walkways, pet beds, electrical cords, etc?: Yes Adequate lighting in your home to reduce risk of falls?: Yes Life alert?: No Use of a cane, walker or w/c?: Yes Grab bars in the bathroom?: No Shower chair or bench in shower?: No Elevated toilet seat or a handicapped toilet?: No  TIMED UP AND GO:  Was the test performed?  No    Cognitive Function:        05/05/2023   11:33 AM 03/04/2022   11:21 AM  6CIT Screen  What Year? 0 points 0 points  What month? 0 points 0 points  What time? 0 points 0 points  Count back from 20 0 points 0 points  Months in reverse 0 points 0 points  Repeat phrase 0 points 0 points  Total Score 0 points 0 points    Immunizations Immunization History  Administered Date(s) Administered   Fluad Quad(high Dose 65+) 02/25/2023   Influenza Split 02/19/2011   Influenza Whole 03/01/2010   Influenza,inj,Quad PF,6+ Mos 03/08/2014, 03/31/2016, 02/19/2017, 03/08/2018, 02/21/2019, 02/23/2020, 03/04/2022   Influenza-Unspecified 01/21/2016, 02/27/2021   PFIZER(Purple Top)SARS-COV-2 Vaccination 06/15/2019, 07/06/2019, 03/05/2020   Pneumococcal Conjugate-13 06/04/2015   Pneumococcal Polysaccharide-23 08/29/2001   Tdap 09/29/2008, 01/20/2022   Zoster, Live 05/13/2009    TDAP status: Up to date 01/20/22  Flu Vaccine status: Up to date 02/25/23  Pneumococcal vaccine status: Up to date 06/04/2015  Covid-19 vaccine status: Completed vaccines  Qualifies for Shingles  Vaccine? Yes   Zostavax completed Yes   Shingrix Completed?: No.    Education has been provided regarding the importance of this vaccine. Patient has been advised to call insurance company to determine out of pocket expense if they have not yet received this vaccine. Advised may also receive vaccine at local pharmacy or Health Dept. Verbalized acceptance and understanding.  Screening Tests Health Maintenance  Topic Date Due   Zoster Vaccines- Shingrix (1 of 2) 06/01/1984   FOOT EXAM  01/12/2019   OPHTHALMOLOGY EXAM  11/28/2020   HEMOGLOBIN A1C  09/02/2022   Medicare Annual Wellness (AWV)  03/05/2023   DTaP/Tdap/Td (3 - Td or Tdap) 01/21/2032   Pneumonia Vaccine 25+ Years old  Completed   INFLUENZA VACCINE  Completed   HPV VACCINES  Aged Out   COVID-19 Vaccine  Discontinued    Health Maintenance  Health Maintenance Due  Topic Date Due   Zoster Vaccines- Shingrix (1 of 2) 06/01/1984   FOOT EXAM  01/12/2019   OPHTHALMOLOGY EXAM  11/28/2020   HEMOGLOBIN A1C  09/02/2022   Medicare Annual Wellness (AWV)  03/05/2023    Lung Cancer Screening: (Low Dose CT Chest recommended if Age 32-80 years, 20 pack-year currently smoking OR have quit w/in 15years.) does not qualify.    Additional Screening:  Hepatitis C Screening: does not qualify;   Vision Screening: Recommended annual ophthalmology exams for early detection  of glaucoma and other disorders of the eye. Is the patient up to date with their annual eye exam?  Yes  Who is the provider or what is the name of the office in which the patient attends annual eye exams? [facility]  If pt is not established with a provider, would they like to be referred to a provider to establish care? No .   Dental Screening: Recommended annual dental exams for proper oral hygiene  Diabetic Foot Exam: Diabetic Foot Exam: Overdue, Pt has been advised about the importance in completing this exam. Pt is scheduled for diabetic foot exam on   .  Community Resource Referral / Chronic Care Management: CRR required this visit?  No   CCM required this visit?  No     Plan:     I have personally reviewed and noted the following in the patient's chart:   Medical and social history Use of alcohol, tobacco or illicit drugs  Current medications and supplements including opioid prescriptions. Patient is currently taking opioid prescriptions. Information provided to patient regarding non-opioid alternatives. Patient advised to discuss non-opioid treatment plan with their provider. Functional ability and status Nutritional status Physical activity Advanced directives List of other physicians Hospitalizations, surgeries, and ER visits in previous 12 months Vitals Screenings to include cognitive, depression, and falls Referrals and appointments  In addition, I have reviewed and discussed with patient certain preventive protocols, quality metrics, and best practice recommendations. A written personalized care plan for preventive services as well as general preventive health recommendations were provided to patient.     Laqueta Bonaventura Sharlyne Cai, CMA   05/05/2023   After Visit Summary: (Mail) Due to this being a telephonic visit, the after visit summary with patients personalized plan was offered to patient via mail    I, Margaree Mackintosh, MD, have reviewed all documentation for this visit. The documentation on 05/10/23 for the exam, diagnosis, procedures, and orders are all accurate and complete.

## 2023-05-05 NOTE — Telephone Encounter (Signed)
LVM to CB and schedule 3 month pain management for January and needs phone visit with nurse for [facility] in December

## 2023-05-05 NOTE — Telephone Encounter (Signed)
Patient calling for refill on chronic pain medication. He is seen regularly. Takes his medication responsibly and reliably. Refill sent in MJB, MD.

## 2023-05-05 NOTE — Telephone Encounter (Signed)
Roberto Reid [id number]  [name] needs refill on below medication.  HYDROcodone-acetaminophen [facility]) 10-325 MG tablet   [facility] [id number] [place], [place] - [address] Phone: [phone]  Fax: [phone]

## 2023-05-05 NOTE — Patient Instructions (Signed)
Next appointment: Follow up in one year for your annual wellness visit.  Preventive Care 2 Years and Older, Male Preventive care refers to lifestyle choices and visits with your health care provider that can promote health and wellness. What does preventive care include? A yearly physical exam. This is also called an annual well check. Dental exams once or twice a year. Routine eye exams. Ask your health care provider how often you should have your eyes checked. Personal lifestyle choices, including: Daily care of your teeth and gums. Regular physical activity. Eating a healthy diet. Avoiding tobacco and drug use. Limiting alcohol use. Practicing safe sex. Taking low doses of aspirin every day. Taking vitamin and mineral supplements as recommended by your health care provider. What happens during an annual well check? The services and screenings done by your health care provider during your annual well check will depend on your age, overall health, lifestyle risk factors, and family history of disease. Counseling  Your health care provider may ask you questions about your: Alcohol use. Tobacco use. Drug use. Emotional well-being. Home and relationship well-being. Sexual activity. Eating habits. History of falls. Memory and ability to understand (cognition). Work and work Astronomer. Screening  You may have the following tests or measurements: Height, weight, and BMI. Blood pressure. Lipid and cholesterol levels. These may be checked every 5 years, or more frequently if you are over 48 years old. Skin check. Lung cancer screening. You may have this screening every year starting at age 16 if you have a 30-pack-year history of smoking and currently smoke or have quit within the past 15 years. Fecal occult blood test (FOBT) of the stool. You may have this test every year starting at age 13. Flexible sigmoidoscopy or colonoscopy. You may have a sigmoidoscopy every 5 years or a  colonoscopy every 10 years starting at age 54. Prostate cancer screening. Recommendations will vary depending on your family history and other risks. Hepatitis C blood test. Hepatitis B blood test. Sexually transmitted disease (STD) testing. Diabetes screening. This is done by checking your blood sugar (glucose) after you have not eaten for a while (fasting). You may have this done every 1-3 years. Abdominal aortic aneurysm (AAA) screening. You may need this if you are a current or former smoker. Osteoporosis. You may be screened starting at age 64 if you are at high risk. Talk with your health care provider about your test results, treatment options, and if necessary, the need for more tests. Vaccines  Your health care provider may recommend certain vaccines, such as: Influenza vaccine. This is recommended every year. Tetanus, diphtheria, and acellular pertussis (Tdap, Td) vaccine. You may need a Td booster every 10 years. Zoster vaccine. You may need this after age 10. Pneumococcal 13-valent conjugate (PCV13) vaccine. One dose is recommended after age 63. Pneumococcal polysaccharide (PPSV23) vaccine. One dose is recommended after age 65. Talk to your health care provider about which screenings and vaccines you need and how often you need them. This information is not intended to replace advice given to you by your health care provider. Make sure you discuss any questions you have with your health care provider. Document Released: 06/14/2015 Document Revised: 02/05/2016 Document Reviewed: 03/19/2015 Elsevier Interactive Patient Education  2017 [employer].  Fall Prevention in the Home Falls can cause injuries. They can happen to people of all ages. There are many things you can do to make your home safe and to help prevent falls. What can I do on  the outside of my home? Regularly fix the edges of walkways and driveways and fix any cracks. Remove anything that might make you trip as you  walk through a door, such as a raised step or threshold. Trim any bushes or trees on the path to your home. Use bright outdoor lighting. Clear any walking paths of anything that might make someone trip, such as rocks or tools. Regularly check to see if handrails are loose or broken. Make sure that both sides of any steps have handrails. Any raised decks and porches should have guardrails on the edges. Have any leaves, snow, or ice cleared regularly. Use sand or salt on walking paths during winter. Clean up any spills in your garage right away. This includes oil or grease spills. What can I do in the bathroom? Use night lights. Install grab bars by the toilet and in the tub and shower. Do not use towel bars as grab bars. Use non-skid mats or decals in the tub or shower. If you need to sit down in the shower, use a plastic, non-slip stool. Keep the floor dry. Clean up any water that spills on the floor as soon as it happens. Remove soap buildup in the tub or shower regularly. Attach bath mats securely with double-sided non-slip rug tape. Do not have throw rugs and other things on the floor that can make you trip. What can I do in the bedroom? Use night lights. Make sure that you have a light by your bed that is easy to reach. Do not use any sheets or blankets that are too big for your bed. They should not hang down onto the floor. Have a firm chair that has side arms. You can use this for support while you get dressed. Do not have throw rugs and other things on the floor that can make you trip. What can I do in the kitchen? Clean up any spills right away. Avoid walking on wet floors. Keep items that you use a lot in easy-to-reach places. If you need to reach something above you, use a strong step stool that has a grab bar. Keep electrical cords out of the way. Do not use floor polish or wax that makes floors slippery. If you must use wax, use non-skid floor wax. Do not have throw rugs  and other things on the floor that can make you trip. What can I do with my stairs? Do not leave any items on the stairs. Make sure that there are handrails on both sides of the stairs and use them. Fix handrails that are broken or loose. Make sure that handrails are as long as the stairways. Check any carpeting to make sure that it is firmly attached to the stairs. Fix any carpet that is loose or worn. Avoid having throw rugs at the top or bottom of the stairs. If you do have throw rugs, attach them to the floor with carpet tape. Make sure that you have a light switch at the top of the stairs and the bottom of the stairs. If you do not have them, ask someone to add them for you. What else can I do to help prevent falls? Wear shoes that: Do not have high heels. Have rubber bottoms. Are comfortable and fit you well. Are closed at the toe. Do not wear sandals. If you use a stepladder: Make sure that it is fully opened. Do not climb a closed stepladder. Make sure that both sides of the stepladder are locked  into place. Ask someone to hold it for you, if possible. Clearly mark and make sure that you can see: Any grab bars or handrails. First and last steps. Where the edge of each step is. Use tools that help you move around (mobility aids) if they are needed. These include: Canes. Walkers. Scooters. Crutches. Turn on the lights when you go into a dark area. Replace any light bulbs as soon as they burn out. Set up your furniture so you have a clear path. Avoid moving your furniture around. If any of your floors are uneven, fix them. If there are any pets around you, be aware of where they are. Review your medicines with your doctor. Some medicines can make you feel dizzy. This can increase your chance of falling. Ask your doctor what other things that you can do to help prevent falls. This information is not intended to replace advice given to you by your health care provider. Make sure  you discuss any questions you have with your health care provider. Document Released: 03/14/2009 Document Revised: 10/24/2015 Document Reviewed: 06/22/2014 Elsevier Interactive Patient Education  2017 [employer].

## 2023-05-10 NOTE — Addendum Note (Signed)
Addended by: Margaree Mackintosh on: 05/10/2023 09:37 AM   Modules accepted: Level of Service

## 2023-05-17 ENCOUNTER — Other Ambulatory Visit: Payer: Self-pay

## 2023-05-17 MED ORDER — LOSARTAN POTASSIUM 100 MG PO TABS: 100.0000 mg | ORAL_TABLET | Freq: Every day | ORAL | 3 refills | Status: DC

## 2023-06-03 ENCOUNTER — Other Ambulatory Visit: Payer: Self-pay

## 2023-06-03 MED ORDER — SILDENAFIL CITRATE 100 MG PO TABS: 50.0000 mg | ORAL_TABLET | Freq: Every day | ORAL | 1 refills | Status: DC | PRN

## 2023-06-04 ENCOUNTER — Other Ambulatory Visit: Payer: Self-pay | Admitting: Internal Medicine

## 2023-06-04 MED ORDER — HYDROCODONE-ACETAMINOPHEN 10-325 MG PO TABS: ORAL_TABLET | ORAL | 0 refills | Status: DC

## 2023-06-04 NOTE — Telephone Encounter (Signed)
 Copied from CRM [id number]. Topic: Clinical - Medication Refill >> Jun 04, 2023 12:05 PM Graeme ORN wrote: Most Recent Primary Care Visit:  Provider: VALENCIA, ARACELI  Department: [facility]  Visit Type: MEDICARE WELL VISIT 45  Date: 05/05/2023  Medication: HYDROcodone -acetaminophen  (NORCO) 10-325 MG tablet  Has the patient contacted their pharmacy? No (Agent: If no, request that the patient contact the pharmacy for the refill. If patient does not wish to contact the pharmacy document the reason why and proceed with request.) (Agent: If yes, when and what did the pharmacy advise?)  Is this the correct pharmacy for this prescription? Yes If no, delete pharmacy and type the correct one.  This is the patient's preferred pharmacy:  [facility] [id number] [facility], [place] - [address] [place] [address] Phone: [phone] Fax: [phone]  Has the prescription been filled recently? 05/05/2023  Is the patient out of the medication? No - 6 left   Has the patient been seen for an appointment in the last year OR does the patient have an upcoming appointment? Yes  Can we respond through MyChart? No  Agent: Please be advised that Rx refills may take up to 3 business days. We ask that you follow-up with your pharmacy.

## 2023-06-04 NOTE — Progress Notes (Signed)
 Patient Care Team: Perri Ronal PARAS, MD as PCP - General (Internal Medicine) Burnard Debby LABOR, MD as PCP - Cardiology (Cardiology) Freddie Lynwood HERO, MD as Consulting Physician (Oncology) Gearline Norris, MD as Consulting Physician (Nephrology)  Visit Date: 06/07/23  Subjective:  No chief complaint on file.  Patient PI:Roberto Reid,Male DOB:1933/04/26,89 y.o.MRN:[id number]   89 y.o. Male presents today for follow-up for pain management. Patient has a past medical history of Chronic Pain & HTN.  History of Chronic Foot Pain; Chronic Foot Deformity (not amenable to surgery at his age) with secondary Musculoskeletal Pain treated with 10-325 mg Norco every 6-8 PRN. Takes his medication reliably and responsibly.    History of Hypertension treated with 5 mg Amlodipine  daily and 100 mg Losartan  daily. Blood pressure today 130/80. History of Non-Rheumatic Aortic Valve Stenosis and DVT managed with 2.5 mg Eliquis  BID. Aortic valve remained stable on 10/07/22 echo. Followed by Dr. Debby Burnard, cardiologist.   Lesion noted on the right side of her external nostril. He reports that it has been evaluated by dermatology as being basal cell, and is currently being followed.   Past Medical History:  Diagnosis Date   Arthritis    right hand (06/15/2016)   Childhood asthma    Chronic anticoagulation 08/30/2017   DVT, lower extremity, proximal, acute, right (HCC) 08/24/2016   06/15/16   GERD (gastroesophageal reflux disease)    Hearing loss    History of kidney stones    Hyperlipidemia    Hypertension    Pneumonia    I've had it ~ 9 times as an adult; last time was 04/2016 (06/15/2016)    Family History  Problem Relation Age of Onset   Parkinson's disease Father    Social History   Social History Narrative   Lives at home with wife   Right handed   Caffeine: 2 cups/day   Review of Systems  Constitutional:  Negative for fever and malaise/fatigue.  HENT:  Negative for congestion.    Eyes:  Negative for blurred vision.  Respiratory:  Negative for cough and shortness of breath.   Cardiovascular:  Negative for chest pain, palpitations and leg swelling.  Gastrointestinal:  Negative for vomiting.  Musculoskeletal:  Negative for back pain.  Skin:  Negative for rash.  Neurological:  Negative for loss of consciousness and headaches.     Objective:  Vitals: There were no vitals taken for this visit. Physical Exam Constitutional:      General: He is not in acute distress.    Appearance: Normal appearance. He is not ill-appearing.  HENT:     Head: Normocephalic and atraumatic.     Comments: Lesion on external right nostril Cardiovascular:     Rate and Rhythm: Normal rate and regular rhythm.     Pulses: Normal pulses.     Heart sounds: Normal heart sounds. No murmur heard.    No friction rub. No gallop.  Pulmonary:     Effort: Pulmonary effort is normal. No respiratory distress.     Breath sounds: Normal breath sounds. No wheezing or rales.  Skin:    General: Skin is warm and dry.  Neurological:     Mental Status: He is alert and oriented to person, place, and time. Mental status is at baseline.  Psychiatric:        Mood and Affect: Mood normal.        Behavior: Behavior normal.        Thought Content: Thought content normal.  Judgment: Judgment normal.     Results:  Studies obtained and personally reviewed by me:  Aortic valve remained stable on 10/07/22 echo.  Labs:     Component Value Date/Time   NA 139 09/03/2022 1147   NA 138 08/23/2017 1101   K 4.6 09/03/2022 1147   CL 105 09/03/2022 1147   CO2 25 09/03/2022 1147   GLUCOSE 93 09/03/2022 1147   BUN 34 (H) 09/03/2022 1147   BUN 34 (H) 08/23/2017 1101   CREATININE 1.74 (H) 09/03/2022 1147   CALCIUM  8.9 09/03/2022 1147   PROT 6.4 09/03/2022 1147   ALBUMIN  3.5 08/24/2016 1519   AST 9 (L) 09/03/2022 1147   ALT 6 (L) 09/03/2022 1147   ALKPHOS 33 (L) 08/24/2016 1519   BILITOT 1.1 09/03/2022  1147   GFRNONAA 44 (L) 05/07/2020 1635   GFRAA 51 (L) 05/07/2020 1635    Lab Results  Component Value Date   WBC 6.3 09/15/2022   HGB 11.6 (L) 09/15/2022   HCT 34.4 (L) 09/15/2022   MCV 98.0 09/15/2022   PLT 220 09/15/2022   Lab Results  Component Value Date   CHOL 140 03/03/2022   HDL 46 03/03/2022   LDLCALC 78 03/03/2022   TRIG 75 03/03/2022   CHOLHDL 3.0 03/03/2022   Lab Results  Component Value Date   HGBA1C 5.3 03/03/2022    Lab Results  Component Value Date   TSH 3.79 04/07/2022     Lab Results  Component Value Date   PSA 0.42 03/03/2022   PSA 0.44 02/21/2021   PSA 0.45 02/19/2020    Assessment & Plan:  Chronic Foot Pain; Chronic Foot Deformity with secondary Musculoskeletal Pain: treated with 10-325 mg Norco every 6-8 PRN. Takes his medication reliably and responsibly. Drug monitoring specimen collected today.   Hypertension treated with 5 mg Amlodipine  daily and 100 mg Losartan  daily. Blood pressure today 130/80. History of Non-Rheumatic Aortic Valve Stenosis and DVT managed with 2.5 mg Eliquis  BID. Aortic valve remained stable on 10/07/22 echo. Followed by Dr. Debby Sor, cardiologist.   Lesion: noted on the right side of her external nostril. He reports that it has been evaluated by dermatology and determined as being basal cell. Is currently being followed by dermatology.  Return in 3 months (April 2025), or as needed!    I,Emily Lagle,acting as a neurosurgeon for Ronal JINNY Hailstone, MD.,have documented all relevant documentation on the behalf of Ronal JINNY Hailstone, MD,as directed by  Ronal JINNY Hailstone, MD while in the presence of Ronal JINNY Hailstone, MD.   I, Ronal JINNY Hailstone, MD, have reviewed all documentation for this visit. The documentation on 06/14/23 for the exam, diagnosis, procedures, and orders are all accurate and complete.

## 2023-06-07 ENCOUNTER — Ambulatory Visit: Payer: Medicare Other | Admitting: Internal Medicine

## 2023-06-07 ENCOUNTER — Encounter: Payer: Self-pay | Admitting: Internal Medicine

## 2023-06-07 VITALS — BP 130/80 | HR 74 | Ht 70.0 in | Wt 167.0 lb

## 2023-06-07 DIAGNOSIS — I10 Essential (primary) hypertension: Secondary | ICD-10-CM

## 2023-06-07 DIAGNOSIS — M21961 Unspecified acquired deformity of right lower leg: Secondary | ICD-10-CM

## 2023-06-07 DIAGNOSIS — R52 Pain, unspecified: Secondary | ICD-10-CM | POA: Diagnosis not present

## 2023-06-07 DIAGNOSIS — G8929 Other chronic pain: Secondary | ICD-10-CM

## 2023-06-07 DIAGNOSIS — E119 Type 2 diabetes mellitus without complications: Secondary | ICD-10-CM | POA: Diagnosis not present

## 2023-06-07 DIAGNOSIS — Z7901 Long term (current) use of anticoagulants: Secondary | ICD-10-CM

## 2023-06-07 DIAGNOSIS — N1832 Chronic kidney disease, stage 3b: Secondary | ICD-10-CM

## 2023-06-07 DIAGNOSIS — M79671 Pain in right foot: Secondary | ICD-10-CM

## 2023-06-07 NOTE — Patient Instructions (Signed)
 Patient's pain is adequately controlled on current regimen.  He takes his medication responsibly and reliably.  Hemoglobin A1c checked today with history of glucose intolerance.  Reminded about diabetic eye exam.

## 2023-06-09 LAB — DRUG MONITORING, PANEL 8 WITH CONFIRMATION, URINE
6 Acetylmorphine: NEGATIVE ng/mL (ref <10)
Alcohol Metabolites: POSITIVE ng/mL — AB (ref <500)
Amphetamines: NEGATIVE ng/mL (ref <500)
Benzodiazepines: NEGATIVE ng/mL (ref <100)
Buprenorphine, Urine: NEGATIVE ng/mL (ref <5)
Cocaine Metabolite: NEGATIVE ng/mL (ref <150)
Codeine: NEGATIVE ng/mL (ref <50)
Creatinine: 80.2 mg/dL (ref >=20.0)
Ethyl Glucuronide (ETG): 15607 ng/mL — ABNORMAL HIGH (ref <500)
Ethyl Sulfate (ETS): 2686 ng/mL — ABNORMAL HIGH (ref <100)
Hydrocodone: 485 ng/mL — ABNORMAL HIGH (ref <50)
Hydromorphone: 441 ng/mL — ABNORMAL HIGH (ref <50)
MDMA: NEGATIVE ng/mL (ref <500)
Marijuana Metabolite: NEGATIVE ng/mL (ref <20)
Morphine: NEGATIVE ng/mL (ref <50)
Norhydrocodone: 702 ng/mL — ABNORMAL HIGH (ref <50)
Opiates: POSITIVE ng/mL — AB (ref <100)
Oxidant: NEGATIVE ug/mL (ref <200)
Oxycodone: NEGATIVE ng/mL (ref <100)
pH: 6.1 (ref 4.5–9.0)

## 2023-06-09 LAB — DM TEMPLATE

## 2023-06-10 ENCOUNTER — Encounter: Payer: Self-pay | Admitting: Ophthalmology

## 2023-06-10 DIAGNOSIS — H52203 Unspecified astigmatism, bilateral: Secondary | ICD-10-CM | POA: Diagnosis not present

## 2023-06-10 DIAGNOSIS — H34211 Partial retinal artery occlusion, right eye: Secondary | ICD-10-CM

## 2023-06-14 ENCOUNTER — Encounter: Payer: Self-pay | Admitting: Ophthalmology

## 2023-06-16 DIAGNOSIS — C44311 Basal cell carcinoma of skin of nose: Secondary | ICD-10-CM | POA: Diagnosis not present

## 2023-06-21 ENCOUNTER — Ambulatory Visit (HOSPITAL_BASED_OUTPATIENT_CLINIC_OR_DEPARTMENT_OTHER)
Admission: RE | Admit: 2023-06-21 | Discharge: 2023-06-21 | Disposition: A | Discharge status: Home / Self Care | Payer: Medicare Other | Source: Ambulatory Visit | Attending: Ophthalmology | Admitting: Ophthalmology

## 2023-06-21 DIAGNOSIS — H34211 Partial retinal artery occlusion, right eye: Secondary | ICD-10-CM | POA: Diagnosis not present

## 2023-06-21 DIAGNOSIS — I6523 Occlusion and stenosis of bilateral carotid arteries: Secondary | ICD-10-CM | POA: Diagnosis not present

## 2023-06-30 ENCOUNTER — Other Ambulatory Visit: Payer: Self-pay

## 2023-06-30 DIAGNOSIS — G8929 Other chronic pain: Secondary | ICD-10-CM

## 2023-06-30 MED ORDER — CLONAZEPAM 0.5 MG PO TABS: ORAL_TABLET | ORAL | 1 refills | Status: DC

## 2023-07-05 ENCOUNTER — Other Ambulatory Visit: Payer: Self-pay | Admitting: Internal Medicine

## 2023-07-05 MED ORDER — HYDROCODONE-ACETAMINOPHEN 10-325 MG PO TABS: ORAL_TABLET | ORAL | 0 refills | Status: DC

## 2023-07-05 NOTE — Telephone Encounter (Signed)
Copied from CRM [id number]. Topic: Clinical - Medication Refill >> Jul 05, 2023  2:24 PM Payton Doughty wrote: Most Recent Primary Care Visit:  Provider: Margaree Mackintosh  Department: [facility]  Visit Type: OFFICE VISIT  Date: 06/07/2023  Medication:  HYDROcodone-acetaminophen (NORCO) 10-325 MG tablet   Has the patient contacted their pharmacy? no (Agent: If no, request that the patient contact the pharmacy for the refill. If patient does not wish to contact the pharmacy document the reason why and proceed with request.) (Agent: If yes, when and what did the pharmacy advise?)  Is this the correct pharmacy for this prescription? Yes If no, delete pharmacy and type the correct one.  This is the patient's preferred pharmacy:  [facility] [id number] [facility], [place] - [address] [place] [address] Phone: [phone] Fax: [phone]    Has the prescription been filled recently? Yes  Is the patient out of the medication? Yes  Has the patient been seen for an appointment in the last year OR does the patient have an upcoming appointment? Yes  Can we respond through MyChart? Yes  Agent: Please be advised that Rx refills may take up to 3 business days. We ask that you follow-up with your pharmacy.

## 2023-07-07 ENCOUNTER — Telehealth: Payer: Self-pay | Admitting: Internal Medicine

## 2023-07-07 NOTE — Telephone Encounter (Signed)
Received a refill for Paxlovid, called and left a message for patient to call back, to see why he needs a refill.

## 2023-07-12 ENCOUNTER — Other Ambulatory Visit: Payer: Self-pay

## 2023-07-12 DIAGNOSIS — I2782 Chronic pulmonary embolism: Secondary | ICD-10-CM

## 2023-07-12 DIAGNOSIS — N189 Chronic kidney disease, unspecified: Secondary | ICD-10-CM

## 2023-07-12 DIAGNOSIS — I824Y1 Acute embolism and thrombosis of unspecified deep veins of right proximal lower extremity: Secondary | ICD-10-CM

## 2023-07-12 MED ORDER — APIXABAN 2.5 MG PO TABS
2.5000 mg | ORAL_TABLET | Freq: Two times a day (BID) | ORAL | 1 refills | Status: DC
Start: 2023-07-12 — End: 2024-01-11

## 2023-07-21 DIAGNOSIS — Z08 Encounter for follow-up examination after completed treatment for malignant neoplasm: Secondary | ICD-10-CM | POA: Diagnosis not present

## 2023-07-21 DIAGNOSIS — Z85828 Personal history of other malignant neoplasm of skin: Secondary | ICD-10-CM | POA: Diagnosis not present

## 2023-08-02 ENCOUNTER — Other Ambulatory Visit: Payer: Self-pay

## 2023-08-02 MED ORDER — SILDENAFIL CITRATE 100 MG PO TABS: 50.0000 mg | ORAL_TABLET | Freq: Every day | ORAL | 1 refills | Status: AC | PRN

## 2023-08-02 MED ORDER — ZOLPIDEM TARTRATE 10 MG PO TABS
10.0000 mg | ORAL_TABLET | Freq: Every day | ORAL | 0 refills | Status: DC
Start: 2023-08-02 — End: 2023-10-27

## 2023-08-05 ENCOUNTER — Other Ambulatory Visit: Payer: Self-pay | Admitting: Internal Medicine

## 2023-08-05 MED ORDER — HYDROCODONE-ACETAMINOPHEN 10-325 MG PO TABS: ORAL_TABLET | ORAL | 0 refills | Status: DC

## 2023-08-05 NOTE — Telephone Encounter (Signed)
 Refill has been sent to pharmacy.

## 2023-08-05 NOTE — Telephone Encounter (Signed)
 Copied from CRM [id number]. Topic: Clinical - Medication Refill >> Aug 05, 2023  2:53 PM Gery Pray wrote: Most Recent Primary Care Visit:  Provider: Margaree Mackintosh  Department: [facility]  Visit Type: OFFICE VISIT  Date: 06/07/2023  Medication: HYDROcodone-acetaminophen (NORCO) 10-325 MG tablet  Has the patient contacted their pharmacy? Yes (Agent: If no, request that the patient contact the pharmacy for the refill. If patient does not wish to contact the pharmacy document the reason why and proceed with request.) (Agent: If yes, when and what did the pharmacy advise?) Pharmacy stated to call the ordering provider  Is this the correct pharmacy for this prescription? Yes If no, delete pharmacy and type the correct one.  This is the patient's preferred pharmacy:  [facility] [id number] [facility], [place] - [address] [place] [address] Phone: [phone] Fax: [phone]  Has the prescription been filled recently? No  Is the patient out of the medication? No 3 left  Has the patient been seen for an appointment in the last year OR does the patient have an upcoming appointment? Yes  Can we respond through MyChart? Yes  Agent: Please be advised that Rx refills may take up to 3 business days. We ask that you follow-up with your pharmacy.

## 2023-09-01 ENCOUNTER — Telehealth: Payer: Self-pay | Admitting: Internal Medicine

## 2023-09-01 NOTE — Telephone Encounter (Signed)
 Received Fax RX request from  Pharmacy -  [facility] [id number] - [place], [place] - [address] Phone: [phone]  Fax: [phone]      Medication - QUEtiapine (SEROQUEL) 50 MG tablet   Last Refill - 05/31/2023  Last OV - 06/07/2023  Last CPE - 05/05/2023  Next Appointment - 4/8/22025

## 2023-09-02 ENCOUNTER — Encounter: Payer: Self-pay | Admitting: Internal Medicine

## 2023-09-02 MED ORDER — QUETIAPINE FUMARATE 50 MG PO TABS: 50.0000 mg | ORAL_TABLET | Freq: Every day | ORAL | 0 refills | Status: DC

## 2023-09-02 NOTE — Telephone Encounter (Signed)
 Refill Seroquel x 90 days. MJB, MD

## 2023-09-03 NOTE — Progress Notes (Signed)
 Patient Care Team: Sylvan Evener, MD as PCP - General (Internal Medicine) Millicent Ally, MD as PCP - Cardiology (Cardiology) Scotty Cyphers, MD as Consulting Physician (Oncology) Leandra Pro, MD as Consulting Physician (Nephrology)  Visit Date: 09/07/23  Subjective:   Chief Complaint  Patient presents with   Chronic pain management  Patient ZO:XWRUEA L Belue,Male DOB:1933-02-08,89 y.o. VWU:[id number]   89 y.o. Male presents today for 3 months follow-up for Pain Management. Patient has a past medical history of Chronic Pain secondary to Chronic Foot Deformity; Hypertension; Chronic Anti-Coagulation Use; Hx of DVT; Aortic Valve Stenosis.  History of Chronic Foot Deformity with Secondary Musculoskeletal/Foot Pain treated with Hydrocodone-Acetaminophen 10-325 mg every 6-8 hours as needed.   History of Hypertension treated with Amlodipine 5 mg daily and Losartan 100 mg daily. Blood Pressure: normotensive today at 118/70. History of Non-Rheumatic Aortic Valve Stenosis; DVT managed on Eliquis 2.5 mg twice daily, followed by Cardiologist, Dr. Magnus Schuller. Bilateral Carotid US  on 06/23/2023 noted mild (1-49%) stenosis proximal RIGHT internal carotid secondary to irregular /ulcerated heterogenous atherosclerotic plaque, mild (1-49%) stenosis proximal LEFT internal carotid secondary to smooth heterogenous atherosclerotic plaque, and vertebral arteries are patent with normal antegrade flow.    Past Medical History:  Diagnosis Date   Arthritis    "right hand" (06/15/2016)   Childhood asthma    Chronic anticoagulation 08/30/2017   DVT, lower extremity, proximal, acute, right (HCC) 08/24/2016   06/15/16   GERD (gastroesophageal reflux disease)    Hearing loss    History of kidney stones    Hyperlipidemia    Hypertension    Pneumonia    "I've had it ~ 9 times as an adult; last time was 04/2016" (06/15/2016)    Allergies  Allergen Reactions   Oxycodone-Acetaminophen Other (See  Comments)    confusion   Percocet [Oxycodone-Acetaminophen]     Wild hallucinations. He states he has tolerated plain oxycodone in the past.    Sulfa Antibiotics    Sulfamethoxazole Rash    Family History  Problem Relation Age of Onset   Parkinson's disease Father    Social History   Social History Narrative   Lives at home with wife   Right handed   Caffeine: 2 cups/day   Review of Systems  All other systems reviewed and are negative.    Objective:  Vitals: BP 118/70   Pulse 70   Temp (!) 97.1 F (36.2 C) (Temporal)   Ht 5' 10.5" (1.791 m)   Wt 164 lb (74.4 kg)   SpO2 99%   BMI 23.20 kg/m   Physical Exam Vitals and nursing note reviewed.  Constitutional:      General: He is not in acute distress.    Appearance: Normal appearance. He is not ill-appearing.  HENT:     Head: Normocephalic and atraumatic.  Cardiovascular:     Rate and Rhythm: Normal rate and regular rhythm.     Pulses: Normal pulses.     Heart sounds: Normal heart sounds. No murmur heard.    No friction rub. No gallop.  Pulmonary:     Effort: Pulmonary effort is normal. No respiratory distress.     Breath sounds: Normal breath sounds. No wheezing or rales.  Skin:    General: Skin is warm and dry.  Neurological:     Mental Status: He is alert and oriented to person, place, and time. Mental status is at baseline.  Psychiatric:        Mood and Affect: Mood  normal.        Behavior: Behavior normal.        Thought Content: Thought content normal.        Judgment: Judgment normal.     Results:  Studies Obtained And Personally Reviewed By Me:  BILATERAL CAROTID DUPLEX ULTRASOUND  06/23/2023   FINDINGS: Criteria: Quantification of carotid stenosis is based on velocity parameters that correlate the residual internal carotid diameter with NASCET-based stenosis levels, using the diameter of the distal internal carotid lumen as the denominator for stenosis measurement.   The following velocity  measurements were obtained:   RIGHT ICA: 94/16 cm/sec CCA: 87/11 cm/sec   SYSTOLIC ICA/CCA RATIO:  1.1   ECA:  89 cm/sec   LEFT   ICA: 76/18 cm/sec   CCA: 18 81/10 cm/sec   SYSTOLIC ICA/CCA RATIO:  0.9   ECA:  90 cm/sec   RIGHT CAROTID ARTERY: Mild irregular/ulcerated heterogeneous atherosclerotic plaque in the proximal internal carotid artery. By peak systolic velocity criteria, the estimated stenosis is less than 50%.   RIGHT VERTEBRAL ARTERY:  Patent with normal antegrade flow.   LEFT CAROTID ARTERY: Mild smooth heterogeneous atherosclerotic plaque in the proximal internal carotid artery. By peak systolic velocity criteria, the estimated stenosis is less than 50%.   LEFT VERTEBRAL ARTERY:  Patent with normal antegrade flow.   IMPRESSION: 1. Mild (1-49%) stenosis proximal right internal carotid artery secondary to irregular/ulcerated heterogeneous atherosclerotic plaque. 2. Mild (1-49%) stenosis proximal left internal carotid artery secondary to smooth heterogeneous atherosclerotic plaque. 3. Vertebral arteries are patent with normal antegrade flow.   Labs:     Component Value Date/Time   NA 139 09/03/2022 1147   NA 138 08/23/2017 1101   K 4.6 09/03/2022 1147   CL 105 09/03/2022 1147   CO2 25 09/03/2022 1147   GLUCOSE 93 09/03/2022 1147   BUN 34 (H) 09/03/2022 1147   BUN 34 (H) 08/23/2017 1101   CREATININE 1.74 (H) 09/03/2022 1147   CALCIUM 8.9 09/03/2022 1147   PROT 6.4 09/03/2022 1147   ALBUMIN 3.5 08/24/2016 1519   AST 9 (L) 09/03/2022 1147   ALT 6 (L) 09/03/2022 1147   ALKPHOS 33 (L) 08/24/2016 1519   BILITOT 1.1 09/03/2022 1147   GFRNONAA 44 (L) 05/07/2020 1635   GFRAA 51 (L) 05/07/2020 1635    Lab Results  Component Value Date   WBC 6.3 09/15/2022   HGB 11.6 (L) 09/15/2022   HCT 34.4 (L) 09/15/2022   MCV 98.0 09/15/2022   PLT 220 09/15/2022   Lab Results  Component Value Date   CHOL 140 03/03/2022   HDL 46 03/03/2022   LDLCALC 78  03/03/2022   TRIG 75 03/03/2022   CHOLHDL 3.0 03/03/2022   Lab Results  Component Value Date   HGBA1C 5.3 03/03/2022    Lab Results  Component Value Date   TSH 3.79 04/07/2022    Lab Results  Component Value Date   PSA 0.42 03/03/2022   PSA 0.44 02/21/2021   PSA 0.45 02/19/2020     Assessment & Plan:   Chronic Foot Deformity with Secondary Musculoskeletal/Foot Pain treated with Hydrocodone-Acetaminophen 10-325 mg every 6-8 hours as needed. He takes his medication reliably and responsibly. Drug Monitoring Specimen collected.   Hypertension treated with Amlodipine 5 mg daily and Losartan 100 mg daily. Blood Pressure: normotensive today at 118/70.   Non-Rheumatic Aortic Valve Stenosis; DVT managed on Eliquis 2.5 mg twice daily, followed by Cardiologist, Dr. Magnus Schuller. Bilateral Carotid US  on 06/23/2023 noted  mild (1-49%) stenosis proximal RIGHT internal carotid secondary to irregular /ulcerated heterogenous atherosclerotic plaque, mild (1-49%) stenosis proximal LEFT internal carotid secondary to smooth heterogenous atherosclerotic plaque, and vertebral arteries are patent with normal antegrade flow.  Return in about 3 months (around 12/07/2023) for Pain Management Follow-up. Annual visit will be 05/15/2024 with labs on 05/11/2024.   I,Emily Lagle,acting as a Neurosurgeon for Sylvan Evener, MD.,have documented all relevant documentation on the behalf of Sylvan Evener, MD,as directed by  Sylvan Evener, MD while in the presence of Sylvan Evener, MD.   I, Sylvan Evener, MD, have reviewed all documentation for this visit. The documentation on 09/12/23 for the exam, diagnosis, procedures, and orders are all accurate and complete.

## 2023-09-06 ENCOUNTER — Other Ambulatory Visit: Payer: Self-pay | Admitting: Internal Medicine

## 2023-09-06 NOTE — Telephone Encounter (Unsigned)
 Copied from CRM [id number]. Topic: Clinical - Medication Refill >> Sep 06, 2023 12:25 PM Clayton Bibles wrote: Most Recent Primary Care Visit:  Provider: Margaree Mackintosh  Department: [facility]  Visit Type: OFFICE VISIT  Date: 06/07/2023  Medication: HYDROcodone-acetaminophen (NORCO) 10-325 MG tablet   Has the patient contacted their pharmacy? Yes (Agent: If no, request that the patient contact the pharmacy for the refill. If patient does not wish to contact the pharmacy document the reason why and proceed with request.) (Agent: If yes, when and what did the pharmacy advise?) The pharmacy needs order to refill  Is this the correct pharmacy for this prescription? Yes If no, delete pharmacy and type the correct one.  This is the patient's preferred pharmacy:  [facility] [id number] [facility], [place] - [address] [place] [address] Phone: [phone] Fax: [phone]  Has the prescription been filled recently? No  Is the patient out of the medication? Yes - He has 2 pills left  Has the patient been seen for an appointment in the last year OR does the patient have an upcoming appointment? Yes  Can we respond through MyChart? Yes  Agent: Please be advised that Rx refills may take up to 3 business days. We ask that you follow-up with your pharmacy.

## 2023-09-07 ENCOUNTER — Ambulatory Visit: Payer: Medicare Other | Admitting: Internal Medicine

## 2023-09-07 ENCOUNTER — Encounter: Payer: Self-pay | Admitting: Internal Medicine

## 2023-09-07 VITALS — BP 118/70 | HR 70 | Temp 97.1°F | Ht 70.5 in | Wt 164.0 lb

## 2023-09-07 DIAGNOSIS — Z79899 Other long term (current) drug therapy: Secondary | ICD-10-CM | POA: Diagnosis not present

## 2023-09-07 DIAGNOSIS — M21969 Unspecified acquired deformity of unspecified lower leg: Secondary | ICD-10-CM | POA: Diagnosis not present

## 2023-09-07 DIAGNOSIS — Z7901 Long term (current) use of anticoagulants: Secondary | ICD-10-CM | POA: Diagnosis not present

## 2023-09-07 DIAGNOSIS — I10 Essential (primary) hypertension: Secondary | ICD-10-CM | POA: Diagnosis not present

## 2023-09-07 DIAGNOSIS — G8929 Other chronic pain: Secondary | ICD-10-CM

## 2023-09-07 DIAGNOSIS — E119 Type 2 diabetes mellitus without complications: Secondary | ICD-10-CM

## 2023-09-07 DIAGNOSIS — I350 Nonrheumatic aortic (valve) stenosis: Secondary | ICD-10-CM | POA: Diagnosis not present

## 2023-09-07 DIAGNOSIS — M79671 Pain in right foot: Secondary | ICD-10-CM | POA: Diagnosis not present

## 2023-09-07 DIAGNOSIS — R52 Pain, unspecified: Secondary | ICD-10-CM | POA: Diagnosis not present

## 2023-09-07 DIAGNOSIS — N1832 Chronic kidney disease, stage 3b: Secondary | ICD-10-CM

## 2023-09-07 MED ORDER — HYDROCODONE-ACETAMINOPHEN 10-325 MG PO TABS: ORAL_TABLET | ORAL | 0 refills | Status: DC

## 2023-09-09 LAB — DRUG MONITORING, PANEL 8 WITH CONFIRMATION, URINE
6 Acetylmorphine: NEGATIVE ng/mL (ref <10)
Alcohol Metabolites: POSITIVE ng/mL — AB (ref <500)
Amphetamines: NEGATIVE ng/mL (ref <500)
Benzodiazepines: NEGATIVE ng/mL (ref <100)
Buprenorphine, Urine: NEGATIVE ng/mL (ref <5)
Cocaine Metabolite: NEGATIVE ng/mL (ref <150)
Codeine: NEGATIVE ng/mL (ref <50)
Creatinine: 100 mg/dL (ref >=20.0)
Ethyl Glucuronide (ETG): 27820 ng/mL — ABNORMAL HIGH (ref <500)
Ethyl Sulfate (ETS): 4897 ng/mL — ABNORMAL HIGH (ref <100)
Hydrocodone: 819 ng/mL — ABNORMAL HIGH (ref <50)
Hydromorphone: 770 ng/mL — ABNORMAL HIGH (ref <50)
MDMA: NEGATIVE ng/mL (ref <500)
Marijuana Metabolite: NEGATIVE ng/mL (ref <20)
Morphine: NEGATIVE ng/mL (ref <50)
Norhydrocodone: 1486 ng/mL — ABNORMAL HIGH (ref <50)
Opiates: POSITIVE ng/mL — AB (ref <100)
Oxidant: NEGATIVE ug/mL (ref <200)
Oxycodone: NEGATIVE ng/mL (ref <100)
pH: 5.9 (ref 4.5–9.0)

## 2023-09-09 LAB — DM TEMPLATE

## 2023-09-12 ENCOUNTER — Encounter: Payer: Self-pay | Admitting: Internal Medicine

## 2023-09-12 NOTE — Patient Instructions (Signed)
 It was good to you today. Pain in under reasonably good control with this regimen. Follow up in 3 months. Patient keeps appointments and takes med responsibly.

## 2023-09-13 ENCOUNTER — Encounter (HOSPITAL_COMMUNITY): Payer: Self-pay | Admitting: Cardiovascular Disease

## 2023-10-04 DIAGNOSIS — N5201 Erectile dysfunction due to arterial insufficiency: Secondary | ICD-10-CM | POA: Diagnosis not present

## 2023-10-04 DIAGNOSIS — E291 Testicular hypofunction: Secondary | ICD-10-CM | POA: Diagnosis not present

## 2023-10-06 ENCOUNTER — Other Ambulatory Visit: Payer: Self-pay

## 2023-10-06 MED ORDER — FOLIC ACID 1 MG PO TABS: 1.0000 mg | ORAL_TABLET | Freq: Every day | ORAL | 99 refills | Status: AC

## 2023-10-08 ENCOUNTER — Other Ambulatory Visit: Payer: Self-pay | Admitting: Internal Medicine

## 2023-10-08 MED ORDER — HYDROCODONE-ACETAMINOPHEN 10-325 MG PO TABS: ORAL_TABLET | ORAL | 0 refills | Status: DC

## 2023-10-08 NOTE — Telephone Encounter (Signed)
 Copied from CRM [id number]. Topic: Clinical - Medication Refill >> Oct 08, 2023 10:25 AM Jorie Newness J wrote: Medication:   HYDROcodone -acetaminophen  [facility]) 10-325 MG tablet Medication Date: 09/07/2023 Department: Galen Judge, MD Ordering/Authorizing: Sylvan Evener, MD  Order Providers  Prescribing Provider Encounter Provider [name], Jaynie Meyers, MD Sylvan Evener, MD  Outpatient Medication Detail   Disp Refills Start End  HYDROcodone -acetaminophen  [facility]) 10-325 MG tablet 90 tablet 0 09/07/2023 -  Sig: One tab by mouth every 6-8 hours  for chronic pain  Sent to pharmacy as: HYDROcodone -acetaminophen  (NORCO) 10-325 MG tablet  Earliest Fill Date: 09/07/2023  E-Prescribing Status: Receipt confirmed by pharmacy (09/07/2023  9:38 AM EDT)   Pharmacy  [facility] [id number] - [place], [place] - [address]  Additional Information  Associated Reports View Encounter Priority and Order Details    Has the patient contacted their pharmacy? Yes (Agent: If no, request that the patient contact the pharmacy for the refill. If patient does not wish to contact the pharmacy document the reason why and proceed with request.) (Agent: If yes, when and what did the pharmacy advise?)  This is the patient's preferred pharmacy:  [facility] [id number] [place], [place] - [address] [place] [address] Phone: [phone] Fax: [phone]    Is this the correct pharmacy for this prescription? Yes If no, delete pharmacy and type the correct one.   Has the prescription been filled recently? No  Is the patient out of the medication? Yes  Has the patient been seen for an appointment in the last year OR does the patient have an upcoming appointment? Yes  Can we respond through MyChart? No  Agent: Please be advised that Rx refills may take up to 3 business days. We ask that you follow-up with your pharmacy.

## 2023-10-08 NOTE — Telephone Encounter (Signed)
 Medication: Hydrocodone  Directions: One tab by mouth every 6-8 hours for chronic pain  Last given: 09/07/2023 Number refills: 0 Last o/v: 09/07/2023 Follow up:  Return in about 3 months (around 12/07/2023) for Pain Management Follow-up. Labs:  09/07/2023

## 2023-10-08 NOTE — Telephone Encounter (Signed)
 Copied from CRM [id number]. Topic: Clinical - Medication Refill >> Oct 08, 2023 10:25 AM Roberto Reid J wrote: Medication:   HYDROcodone -acetaminophen  [facility]) 10-325 MG tablet Medication Date: 09/07/2023 Department: Galen Judge, MD Ordering/Authorizing: Sylvan Evener, MD  Order Providers  Prescribing Provider Encounter Provider [name], Jaynie Meyers, MD Sylvan Evener, MD  Outpatient Medication Detail   Disp Refills Start End  HYDROcodone -acetaminophen  [facility]) 10-325 MG tablet 90 tablet 0 09/07/2023 -  Sig: One tab by mouth every 6-8 hours  for chronic pain  Sent to pharmacy as: HYDROcodone -acetaminophen  (NORCO) 10-325 MG tablet  Earliest Fill Date: 09/07/2023  E-Prescribing Status: Receipt confirmed by pharmacy (09/07/2023  9:38 AM EDT)   Pharmacy  [facility] [id number] - [place], [place] - [address]  Additional Information  Associated Reports View Encounter Priority and Order Details    Has the patient contacted their pharmacy? Yes (Agent: If no, request that the patient contact the pharmacy for the refill. If patient does not wish to contact the pharmacy document the reason why and proceed with request.) (Agent: If yes, when and what did the pharmacy advise?)  This is the patient's preferred pharmacy:  [facility] [id number] [place], [place] - [address] [place] [address] Phone: [phone] Fax: [phone]    Is this the correct pharmacy for this prescription? Yes If no, delete pharmacy and type the correct one.   Has the prescription been filled recently? No  Is the patient out of the medication? Yes  Has the patient been seen for an appointment in the last year OR does the patient have an upcoming appointment? Yes  Can we respond through MyChart? No  Agent: Please be advised that Rx refills may take up to 3 business days. We ask that you follow-up with your pharmacy.

## 2023-10-14 ENCOUNTER — Ambulatory Visit (HOSPITAL_COMMUNITY)
Admission: RE | Admit: 2023-10-14 | Discharge: 2023-10-14 | Disposition: A | Discharge status: Home / Self Care | Source: Ambulatory Visit | Attending: Cardiovascular Disease | Admitting: Cardiovascular Disease

## 2023-10-14 DIAGNOSIS — I350 Nonrheumatic aortic (valve) stenosis: Secondary | ICD-10-CM | POA: Insufficient documentation

## 2023-10-14 LAB — ECHOCARDIOGRAM COMPLETE
AR max vel: 1.25 cm2
AV Area VTI: 1.22 cm2
AV Area mean vel: 1.21 cm2
AV Mean grad: 20 mmHg
AV Peak grad: 34.8 mmHg
Ao pk vel: 2.95 m/s
Area-P 1/2: 3.58 cm2
S' Lateral: 2.5 cm

## 2023-10-18 ENCOUNTER — Other Ambulatory Visit: Payer: Self-pay

## 2023-10-18 MED ORDER — AMLODIPINE BESYLATE 5 MG PO TABS: 5.0000 mg | ORAL_TABLET | Freq: Every day | ORAL | 3 refills | Status: DC

## 2023-10-19 ENCOUNTER — Encounter: Payer: Self-pay | Admitting: Internal Medicine

## 2023-10-19 ENCOUNTER — Ambulatory Visit: Payer: Self-pay | Admitting: Cardiovascular Disease

## 2023-10-21 ENCOUNTER — Other Ambulatory Visit: Payer: Self-pay

## 2023-10-21 ENCOUNTER — Ambulatory Visit: Payer: Self-pay

## 2023-10-21 MED ORDER — LEVOFLOXACIN 500 MG PO TABS: 500.0000 mg | ORAL_TABLET | Freq: Every day | ORAL | 0 refills | Status: DC

## 2023-10-21 NOTE — Telephone Encounter (Signed)
 Levaquin  was sent in.

## 2023-10-21 NOTE — Addendum Note (Signed)
 Addended by: Elandra Powell P on: 10/21/2023 03:32 PM   Modules accepted: Orders

## 2023-10-21 NOTE — Telephone Encounter (Signed)
 Copied from CRM [id number]. Topic: Clinical - Medical Advice >> Oct 21, 2023 10:34 AM Roberto Reid wrote: Reason for CRM: Patient called states this is his 2nd request. Pharmacy originally requested. Would like Dr Liane Redman to prescribe antibiotic [place]. [employer]. Thank You

## 2023-10-21 NOTE — Telephone Encounter (Signed)
 Patient is calling asking for a prescription for antibiotic. Patient states he has gotten levofloxacin  in the past to take on trips with him. Patient denies any symptoms at this time. Patient is just wanting a prescription sent to his pharmacy. Call to follow up or message on MyChart is needed.  Reason for Disposition  Prescription request for new medicine (not a refill)  Answer Assessment - Initial Assessment Questions 1. NAME of MEDICINE: "What medicine(s) are you calling about?"     Patient is requesting levofloxacin  2. QUESTION: "What is your question?" (e.g., double dose of medicine, side effect)     Patient reports that he is going out of town and usually carries an antibiotic with him just in case. Reports no symptoms at this time. States he has gotten this in the past  Protocols used: Medication Question Call-A-AH

## 2023-10-21 NOTE — Telephone Encounter (Signed)
 This RN made the first attempt to contact the pt. Pt did not answer. RN LVM with a callback number.

## 2023-10-27 ENCOUNTER — Other Ambulatory Visit: Payer: Self-pay

## 2023-10-27 NOTE — Telephone Encounter (Signed)
 Copied from CRM [id number]. Topic: Clinical - Medication Refill >> Oct 27, 2023  1:39 PM Roberto Reid wrote: Medication: zolpidem  (AMBIEN ) 10 MG tablet  Has the patient contacted their pharmacy? Yes (Agent: If no, request that the patient contact the pharmacy for the refill. If patient does not wish to contact the pharmacy document the reason why and proceed with request.) (Agent: If yes, when and what did the pharmacy advise?)  This is the patient'Reid preferred pharmacy:  [facility] [id number] [place], [place] - [address] [place] [address] Phone: [phone] Fax: [phone]  Is this the correct pharmacy for this prescription? Yes If no, delete pharmacy and type the correct one.   Has the prescription been filled recently? No  Is the patient out of the medication? Yes  Has the patient been seen for an appointment in the last year OR does the patient have an upcoming appointment? Yes  Can we respond through MyChart? No  Agent: Please be advised that Rx refills may take up to 3 business days. We ask that you follow-up with your pharmacy.

## 2023-10-28 MED ORDER — ZOLPIDEM TARTRATE 10 MG PO TABS: 10.0000 mg | ORAL_TABLET | Freq: Every day | ORAL | 0 refills | Status: DC

## 2023-11-04 ENCOUNTER — Other Ambulatory Visit: Payer: Self-pay

## 2023-11-04 MED ORDER — AMLODIPINE BESYLATE 5 MG PO TABS: 5.0000 mg | ORAL_TABLET | Freq: Every day | ORAL | 3 refills | Status: DC

## 2023-11-09 ENCOUNTER — Other Ambulatory Visit: Payer: Self-pay | Admitting: Internal Medicine

## 2023-11-09 ENCOUNTER — Encounter: Payer: Self-pay | Admitting: Internal Medicine

## 2023-11-09 ENCOUNTER — Telehealth: Payer: Self-pay | Admitting: Internal Medicine

## 2023-11-09 MED ORDER — HYDROCODONE-ACETAMINOPHEN 10-325 MG PO TABS: ORAL_TABLET | ORAL | 0 refills | Status: DC

## 2023-11-09 NOTE — Telephone Encounter (Signed)
 Patient needs refill on chronic pain medication. He takes reliably and responsibly. He is seen here every 3 months and undergoes regular drug testing. Rx refilled. MJB, MD

## 2023-11-09 NOTE — Telephone Encounter (Signed)
 Patient called in request a refill on HYDROcodone -acetaminophen  (NORCO) 10-325 MG tablet  He asked for it to go to [facility] [id number] [place], [place] - [address]  Last visit 09/24/23 Next visit 12/07/23

## 2023-11-16 ENCOUNTER — Ambulatory Visit: Admitting: Cardiovascular Disease

## 2023-12-06 ENCOUNTER — Other Ambulatory Visit: Payer: Self-pay | Admitting: Internal Medicine

## 2023-12-06 NOTE — Telephone Encounter (Signed)
 11/09/23 90 tabs/0 RF

## 2023-12-06 NOTE — Telephone Encounter (Unsigned)
 Copied from CRM [id number]. Topic: Clinical - Medication Refill >> Dec 06, 2023  2:15 PM Jakyia R wrote: Medication: HYDROcodone -acetaminophen  (NORCO) 10-325 MG tablet  Has the patient contacted their pharmacy? Yes, advised pt to call pcp    This is the patient's preferred pharmacy:   [facility] [id number] [place], [place] - [address] [place] [address] Phone: [phone] Fax: [phone]   Is this the correct pharmacy for this prescription? Yes If no, delete pharmacy and type the correct one.   Has the prescription been filled recently? Yes  Is the patient out of the medication? No  Has the patient been seen for an appointment in the last year OR does the patient have an upcoming appointment? Yes  Can we respond through MyChart? Yes  Agent: Please be advised that Rx refills may take up to 3 business days. We ask that you follow-up with your pharmacy.

## 2023-12-07 ENCOUNTER — Ambulatory Visit: Admitting: Internal Medicine

## 2023-12-08 ENCOUNTER — Other Ambulatory Visit: Payer: Self-pay

## 2023-12-08 MED ORDER — HYDROCODONE-ACETAMINOPHEN 10-325 MG PO TABS: ORAL_TABLET | ORAL | 0 refills | Status: DC

## 2023-12-08 MED ORDER — QUETIAPINE FUMARATE 50 MG PO TABS: 50.0000 mg | ORAL_TABLET | Freq: Every day | ORAL | 1 refills | Status: DC

## 2023-12-08 NOTE — Telephone Encounter (Signed)
 I have changed the date to be filled tomorrow  12/09/2023

## 2023-12-09 ENCOUNTER — Ambulatory Visit: Admitting: Internal Medicine

## 2023-12-09 ENCOUNTER — Encounter: Payer: Self-pay | Admitting: Internal Medicine

## 2023-12-09 ENCOUNTER — Ambulatory Visit: Attending: Internal Medicine | Admitting: Internal Medicine

## 2023-12-09 VITALS — BP 120/60 | HR 62 | Ht 70.0 in | Wt 145.0 lb

## 2023-12-09 VITALS — BP 137/68 | HR 68 | Ht 70.0 in | Wt 155.4 lb

## 2023-12-09 DIAGNOSIS — Z7901 Long term (current) use of anticoagulants: Secondary | ICD-10-CM | POA: Diagnosis not present

## 2023-12-09 DIAGNOSIS — I4510 Unspecified right bundle-branch block: Secondary | ICD-10-CM | POA: Diagnosis not present

## 2023-12-09 DIAGNOSIS — I440 Atrioventricular block, first degree: Secondary | ICD-10-CM | POA: Diagnosis not present

## 2023-12-09 DIAGNOSIS — I10 Essential (primary) hypertension: Secondary | ICD-10-CM

## 2023-12-09 DIAGNOSIS — M21961 Unspecified acquired deformity of right lower leg: Secondary | ICD-10-CM

## 2023-12-09 DIAGNOSIS — I361 Nonrheumatic tricuspid (valve) insufficiency: Secondary | ICD-10-CM

## 2023-12-09 DIAGNOSIS — R52 Pain, unspecified: Secondary | ICD-10-CM

## 2023-12-09 DIAGNOSIS — I350 Nonrheumatic aortic (valve) stenosis: Secondary | ICD-10-CM

## 2023-12-09 DIAGNOSIS — I82409 Acute embolism and thrombosis of unspecified deep veins of unspecified lower extremity: Secondary | ICD-10-CM

## 2023-12-09 DIAGNOSIS — G8929 Other chronic pain: Secondary | ICD-10-CM

## 2023-12-09 DIAGNOSIS — M79671 Pain in right foot: Secondary | ICD-10-CM

## 2023-12-09 NOTE — Progress Notes (Addendum)
 Patient Care Team: Perri Ronal PARAS, MD as PCP - General (Internal Medicine) Burnard Debby LABOR, MD as PCP - Cardiology (Cardiology) Freddie Lynwood HERO, MD as Consulting Physician (Oncology) Gearline Norris, MD as Consulting Physician (Nephrology)  Visit Date: 12/09/23  Subjective:   Chief Complaint  Patient presents with   Pain Management   Chronic foot pain right    Patient PI:Wjuyjw Marvon, [place] DOB:1932-11-22,89 y.o. FMW:[id number]   89 y.o.Male presents today for 3 months follow-up for Pain Management. Patient has a past medical history of Chronic Pain secondary to Chronic Foot Deformity; Hypertension; Chronic Anti-Coagulation Use; Hx of DVT; Aortic Valve Stenosis.  History of Chronic Foot Deformity; Secondary Chronic Musculoskeletal/ Right Foot Pain treated with Norco 10-325 mg every 6-8 hours as needed.   History of Hypertension treated with Amlodipine  5 mg daily and Losartan  100 mg daily. Blood Pressure: normotensive today at 120/60. History of Non-Rheumatic Aortic Valve Stenosis; DVT treated with Eliquis  2.5 mg twice daily. Followed by Cardiologist, Dr. Burnard.  Past Medical History:  Diagnosis Date   Arthritis    right hand (06/15/2016)   Childhood asthma    Chronic anticoagulation 08/30/2017   DVT, lower extremity, proximal, acute, right (HCC) 08/24/2016   06/15/16   GERD (gastroesophageal reflux disease)    Hearing loss    History of kidney stones    Hyperlipidemia    Hypertension    Pneumonia    I've had it ~ 9 times as an adult; last time was 04/2016 (06/15/2016)    Allergies  Allergen Reactions   Oxycodone -Acetaminophen  Other (See Comments)    confusion   Percocet [Oxycodone -Acetaminophen ]     Wild hallucinations. He states he has tolerated plain oxycodone  in the past.    Sulfa Antibiotics    Sulfamethoxazole Rash    Family History  Problem Relation Age of Onset   Parkinson's disease Father    Social History   Social History Narrative   Lives at  home with wife   Right handed   Caffeine: 2 cups/day   Review of Systems  All other systems reviewed and are negative.    Objective:  Vitals: BP 120/60   Pulse 62   Ht 5' 10 (1.778 m)   Wt 145 lb (65.8 kg)   SpO2 98%   BMI 20.81 kg/m   Physical Exam Vitals and nursing note reviewed.  Constitutional:      General: He is not in acute distress.    Appearance: Normal appearance. He is not ill-appearing.  HENT:     Head: Normocephalic and atraumatic.  Pulmonary:     Effort: Pulmonary effort is normal.  Skin:    General: Skin is warm and dry.  Neurological:     Mental Status: He is alert and oriented to person, place, and time. Mental status is at baseline.  Psychiatric:        Mood and Affect: Mood normal.        Behavior: Behavior normal.        Thought Content: Thought content normal.        Judgment: Judgment normal.     Results:  Studies Obtained And Personally Reviewed By Me: Labs:     Component Value Date/Time   NA 139 09/03/2022 1147   NA 138 08/23/2017 1101   K 4.6 09/03/2022 1147   CL 105 09/03/2022 1147   CO2 25 09/03/2022 1147   GLUCOSE 93 09/03/2022 1147   BUN 34 (H) 09/03/2022 1147   BUN 34 (H) 08/23/2017  1101   CREATININE 1.74 (H) 09/03/2022 1147   CALCIUM  8.9 09/03/2022 1147   PROT 6.4 09/03/2022 1147   ALBUMIN  3.5 08/24/2016 1519   AST 9 (L) 09/03/2022 1147   ALT 6 (L) 09/03/2022 1147   ALKPHOS 33 (L) 08/24/2016 1519   BILITOT 1.1 09/03/2022 1147   GFRNONAA 44 (L) 05/07/2020 1635   GFRAA 51 (L) 05/07/2020 1635    Lab Results  Component Value Date   WBC 6.3 09/15/2022   HGB 11.6 (L) 09/15/2022   HCT 34.4 (L) 09/15/2022   MCV 98.0 09/15/2022   PLT 220 09/15/2022   Lab Results  Component Value Date   CHOL 140 03/03/2022   HDL 46 03/03/2022   LDLCALC 78 03/03/2022   TRIG 75 03/03/2022   CHOLHDL 3.0 03/03/2022   Lab Results  Component Value Date   HGBA1C 5.3 03/03/2022    Lab Results  Component Value Date   TSH 3.79 04/07/2022     Lab Results  Component Value Date   PSA 0.42 03/03/2022   PSA 0.44 02/21/2021   PSA 0.45 02/19/2020     Assessment & Plan:   Chronic Foot Deformity; Secondary Chronic Musculoskeletal/ Right Foot Pain treated with Norco 10-325 mg every 6-8 hours as needed - was refilled yesterday.  Hypertension treated with Amlodipine  5 mg daily and Losartan  100 mg daily. Blood Pressure: normotensive today at 120/60.  Non-Rheumatic Aortic Valve Stenosis; DVT treated with Eliquis  2.5 mg twice daily. Followed by Cardiologist, Dr. Burnard.   He is seen every     I,Emily Lagle,acting as a scribe for Ronal JINNY Hailstone, MD.,have documented all relevant documentation on the behalf of Ronal JINNY Hailstone, MD,as directed by  Ronal JINNY Hailstone, MD while in the presence of Ronal JINNY Hailstone, MD.   I, Ronal JINNY Hailstone, MD, have reviewed all documentation for this visit. The documentation on 12/12/23 for the exam, diagnosis, procedures, and orders are all accurate and complete.

## 2023-12-09 NOTE — Progress Notes (Signed)
 Cardiology Office Note:  .    Date:  12/09/2023  ID:  [name], DOB Dec 14, 1932, MRN [id number] PCP: Perri Ronal PARAS, MD  [facility] Providers Cardiologist:  Debby Sor, MD     CC: Transition to new cardiologist  History of Present Illness: .    Roberto Reid is a 89 y.o. male with a history of cardiovascular conditions who presents for a routine cardiovascular follow-up.  He has a long-standing history of right bundle branch block and hypertension. Over the past few years, he has been concerned about bradycardia, but there has been no indication for intervention. He has experienced sick sinus syndrome, first degree AV block with bifascicular block, and asymptomatic supraventricular tachycardia. He was evaluated by an electrophysiologist in 2024, and no symptoms warranted a pacemaker at that time.  In 2024, he was found to have moderate aortic stenosis and moderate tricuspid regurgitation. His last echocardiogram confirmed these findings. No symptoms such as chest pain, shortness of breath, or lightheadedness during activities like golf, which he enjoys regularly.  He has a history of hyperlipidemia with LDL levels under 100. There is a noted aortic atherosclerosis, but he is currently not experiencing any symptoms related to this condition.  He is a former [employer] manager, now retired, and spends his days engaging in activities such as golf and reading. He maintains a routine of waking up around 8 AM and enjoys bringing coffee to his wife.  Discussed the use of AI scribe software for clinical note transcription with the patient, who gave verbal consent to proceed.   Relevant histories: .  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 - Originally form [place] 2008: Dr. Morris patient 2024: TK Patient ROS: As per  HPI.   Studies Reviewed: .     Cardiac Studies & Procedures   ______________________________________________________________________________________________     ECHOCARDIOGRAM  ECHOCARDIOGRAM COMPLETE 10/14/2023  Narrative ECHOCARDIOGRAM REPORT    Patient Name:   Roberto Reid Date of Exam: 10/14/2023 Medical Rec #:  [id number]        Height:       70.5 in Accession #:    [id number]       Weight:       164.0 lb Date of Birth:  09/26/1932         BSA:          1.929 m Patient Age:    89 years         BP:           144/64 mmHg Patient Gender: M                HR:           82 bpm. Exam Location:  [address]  Procedure: 2D Echo and 3D Echo (Both Spectral and Color Flow Doppler were utilized during procedure).  Indications:    I35.0 Aortic Stenosis  History:        Patient has prior history of Echocardiogram examinations, most recent 10/07/2022. Arrythmias:RBBB; Risk Factors:Hypertension and HLD.  Sonographer:    Waldo Guadalajara RCS Referring Phys: [phone] THOMAS A KELLY  IMPRESSIONS   1. Left ventricular ejection fraction, by estimation, is 60 to 65%. Left ventricular ejection fraction by 3D volume is 68 %. The left ventricle has normal function. The left ventricle has no regional wall motion abnormalities.  There is mild concentric left ventricular hypertrophy. Left ventricular diastolic parameters were normal. The average left ventricular global longitudinal strain is -17.5 %. The global longitudinal strain is abnormal. 2. Right ventricular systolic function is normal. The right ventricular size is normal. There is normal pulmonary artery systolic pressure. The estimated right ventricular systolic pressure is 30.2 mmHg. 3. Left atrial size was mildly dilated. 4. The mitral valve is normal in structure. Mild mitral valve regurgitation. No evidence of mitral stenosis. 5. The tricuspid valve is abnormal. Tricuspid valve regurgitation is moderate. 6. The aortic valve was not  well visualized. There is severe calcifcation of the aortic valve. Aortic valve regurgitation is not visualized. Moderate aortic valve stenosis. Aortic valve area, by VTI measures 1.22 cm. Aortic valve mean gradient measures 20.0 mmHg. 7. The inferior vena cava is normal in size with greater than 50% respiratory variability, suggesting right atrial pressure of 3 mmHg.  FINDINGS Left Ventricle: Left ventricular ejection fraction, by estimation, is 60 to 65%. Left ventricular ejection fraction by 3D volume is 68 %. The left ventricle has normal function. The left ventricle has no regional wall motion abnormalities. The average left ventricular global longitudinal strain is -17.5 %. Strain was performed and the global longitudinal strain is abnormal. The left ventricular internal cavity size was normal in size. There is mild concentric left ventricular hypertrophy. Left ventricular diastolic parameters were normal.  Right Ventricle: The right ventricular size is normal. No increase in right ventricular wall thickness. Right ventricular systolic function is normal. There is normal pulmonary artery systolic pressure. The tricuspid regurgitant velocity is 2.61 m/s, and with an assumed right atrial pressure of 3 mmHg, the estimated right ventricular systolic pressure is 30.2 mmHg.  Left Atrium: Left atrial size was mildly dilated.  Right Atrium: Right atrial size was normal in size.  Pericardium: There is no evidence of pericardial effusion.  Mitral Valve: The mitral valve is normal in structure. Mild mitral annular calcification. Mild mitral valve regurgitation. No evidence of mitral valve stenosis.  Tricuspid Valve: The tricuspid valve is abnormal. Tricuspid valve regurgitation is moderate.  Aortic Valve: The aortic valve was not well visualized. There is severe calcifcation of the aortic valve. Aortic valve regurgitation is not visualized. Moderate aortic stenosis is present. Aortic valve mean  gradient measures 20.0 mmHg. Aortic valve peak gradient measures 34.8 mmHg. Aortic valve area, by VTI measures 1.22 cm.  Pulmonic Valve: The pulmonic valve was normal in structure. Pulmonic valve regurgitation is not visualized.  Aorta: The aortic root is normal in size and structure.  Venous: The inferior vena cava is normal in size with greater than 50% respiratory variability, suggesting right atrial pressure of 3 mmHg.  IAS/Shunts: No atrial level shunt detected by color flow Doppler.  Additional Comments: 3D was performed not requiring image post processing on an independent workstation and was normal.   LEFT VENTRICLE PLAX 2D LVIDd:         3.60 cm         Diastology LVIDs:         2.50 cm         LV e' medial:    9.68 cm/s LV PW:         1.00 cm         LV E/e' medial:  11.8 LV IVS:        1.40 cm         LV e' lateral:   19.60 cm/s LVOT diam:  1.90 cm         LV E/e' lateral: 5.8 LV SV:         71 LV SV Index:   37              2D Longitudinal LVOT Area:     2.84 cm        Strain 2D Strain GLS   -17.5 % Avg:  3D Volume EF LV 3D EF:    Left ventricul ar ejection fraction by 3D volume is 68 %.  3D Volume EF: 3D EF:        68 % LV EDV:       122 ml LV ESV:       39 ml LV SV:        83 ml  RIGHT VENTRICLE RV Basal diam:  3.30 cm RV S prime:     14.40 cm/s TAPSE (M-mode): 2.4 cm RVSP:           30.2 mmHg  LEFT ATRIUM           Index        RIGHT ATRIUM           Index LA diam:      5.00 cm 2.59 cm/m   RA Pressure: 3.00 mmHg LA Vol (A2C): 53.1 ml 27.53 ml/m  RA Area:     12.60 cm LA Vol (A4C): 46.3 ml 24.01 ml/m  RA Volume:   23.10 ml  11.98 ml/m AORTIC VALVE AV Area (Vmax):    1.25 cm AV Area (Vmean):   1.21 cm AV Area (VTI):     1.22 cm AV Vmax:           295.00 cm/s AV Vmean:          207.000 cm/s AV VTI:            0.588 m AV Peak Grad:      34.8 mmHg AV Mean Grad:      20.0 mmHg LVOT Vmax:         130.00 cm/s LVOT Vmean:        88.600  cm/s LVOT VTI:          0.252 m LVOT/AV VTI ratio: 0.43  AORTA Ao Root diam: 3.60 cm Ao Asc diam:  3.40 cm  MITRAL VALVE                TRICUSPID VALVE MV Area (PHT):              TR Peak grad:   27.2 mmHg MV Decel Time:              TR Vmax:        261.00 cm/s MV E velocity: 114.00 cm/s  Estimated RAP:  3.00 mmHg MV A velocity: 81.20 cm/s   RVSP:           30.2 mmHg MV E/A ratio:  1.40 SHUNTS Systemic VTI:  0.25 m Systemic Diam: 1.90 cm  Dalton McleanMD Electronically signed by Ezra Kanner Signature Date/Time: 10/14/2023/11:02:55 PM    Final    MONITORS  LONG TERM MONITOR (3-14 DAYS) 05/06/2022  Narrative Patch Wear Time:  13 days and 23 hours (2023-11-10T10:05:21-0500 to 2023-11-24T10:05:17-0500)  Patient had a min HR of 33 bpm, max HR of 164 bpm, and avg HR of 71 bpm. Predominant underlying rhythm was Sinus Rhythm. First Degree AV Block was present. Bundle Branch Block/IVCD was present. 1 run of Ventricular Tachycardia occurred lasting 6  beats with a max rate of 146 bpm (avg 103 bpm). 5 Supraventricular Tachycardia runs occurred, the run with the fastest interval lasting 4 beats with a max rate of 164 bpm, the longest lasting 5 beats with an avg rate of 116 bpm. 4150 episode(s) of AV Block (2nd Mobitz II) occurred, lasting a total of 10 hours 4 mins. AV Block (2nd Mobitz II) was detected within +/- 45 seconds of symptomatic patient event(s). Isolated SVEs were frequent (11.6%, [id number]), SVE Couplets were occasional (2.7%, [id number]), and SVE Triplets were rare (<1.0%, 4437). Isolated VEs were rare (<1.0%), and no VE Couplets or VE Triplets were present. MD notification criteria for Symptomatic Second Degree AV Block, Mobitz II met - report posted prior to notification per account request (JC).  The predominant rhythm was sinus with average rate 71, minimum sinus rhythm at 50 bpm and maximum sinus tachycardia at 112 bpm.  There was a 6 beat run of nonsustained VT at an  average rate of 103 (max 146.  There were 5 episodes of SVT.  The slowest heart rate was 33 bpm which occurred at 5:54 AM and was consistent with second-degree Mobitz 2 block.  There were numerous episodes of second-degree AV block lasting a total of 10 hours 4 minutes.  Isolated PACs were frequent with occasional atrial couplets.  There was rare ventricular ectopy.       ______________________________________________________________________________________________       Physical Exam:    VS:  BP 137/68 (BP Location: Left Arm)   Pulse 68   Ht 5' 10 (1.778 m)   Wt 155 lb 6.4 oz (70.5 kg)   SpO2 100%   BMI 22.30 kg/m    Wt Readings from Last 3 Encounters:  12/09/23 155 lb 6.4 oz (70.5 kg)  09/07/23 164 lb (74.4 kg)  06/07/23 167 lb (75.8 kg)    Gen: no distress, appears younger than stated age Neck: No JVD Cardiac: No Rubs or Gallops, systolic crescendo murmur, RRR +2 radial pulses Respiratory: Clear to auscultation bilaterally, normal effort, normal  respiratory rate GI: Soft, nontender, non-distended  MS: No  edema;  moves all extremities Integument: Skin feels warm Neuro:  At time of evaluation, alert and oriented to person/place/time/situation  Psych: Normal affect, patient feels ok   ASSESSMENT AND PLAN: .    An EKG was ordered for bradycardia and shows stable bifascicular block  Moderate aortic stenosis Moderate aortic stenosis diagnosed in 2024, confirmed by echocardiogram. Currently asymptomatic with no chest pain, shortness of breath, or fatigue during activities such as golf. Discussed the natural progression of aortic stenosis and the potential need for intervention if symptoms develop. Current guidelines suggest monitoring with echocardiograms annually or sooner if symptoms arise. Discussed the possibility of a TAVR procedure if severe stenosis develops and symptoms present. Mentioned an ongoing clinical trial for moderate aortic stenosis but did not strongly  recommend participation due to age. - Order echocardiogram in one year - Monitor for symptoms such as fatigue, chest pain, or shortness of breath - Consider TAVR procedure if severe stenosis and symptoms develop  Moderate tricuspid regurgitation Moderate tricuspid regurgitation noted on the last echocardiogram. Currently asymptomatic with no indication for intervention. - Monitor as part of routine cardiac care  Right bundle branch block and first degree heart block Chronic right bundle branch block and first degree heart block with some Mobitz type 1 heart block. Evaluated by electrophysiologist with no significant symptoms or high-grade AV block. No current indication for pacemaker placement. -  Monitor for any changes in symptoms or heart rhythm  Hyperlipidemia Hyperlipidemia with LDL levels under 100. Discussed the balance between aggressive cholesterol management and age-related considerations. Current management is conservative given his age of 9 years. Discussed the presence of aortic atherosclerosis but no strong indication to lower LDL further at this time. - Continue current cholesterol management   One year follow up Stanly Leavens, MD FASE [facility] Cardiologist [facility]  [address] [place], [address] [place], [place] [address] [phone]  10:43 AM

## 2023-12-09 NOTE — Patient Instructions (Signed)
 Medication Instructions:  Your physician recommends that you continue on your current medications as directed. Please refer to the Current Medication list given to you today.  *If you need a refill on your cardiac medications before your next appointment, please call your pharmacy*  Lab Work: NONE  If you have labs (blood work) drawn today and your tests are completely normal, you will receive your results only by: MyChart Message (if you have MyChart) OR A paper copy in the mail If you have any lab test that is abnormal or we need to change your treatment, we will call you to review the results.  Testing/Procedures: APRIL 2026- - -Your physician has requested that you have an echocardiogram. Echocardiography is a painless test that uses sound waves to create images of your heart. It provides your doctor with information about the size and shape of your heart and how well your heart's chambers and valves are working. This procedure takes approximately one hour. There are no restrictions for this procedure. Please do NOT wear cologne, perfume, aftershave, or lotions (deodorant is allowed). Please arrive 15 minutes prior to your appointment time.  Please note: We ask at that you not bring children with you during ultrasound (echo/ vascular) testing. Due to room size and safety concerns, children are not allowed in the ultrasound rooms during exams. Our front office staff cannot provide observation of children in our lobby area while testing is being conducted. An adult accompanying a patient to their appointment will only be allowed in the ultrasound room at the discretion of the ultrasound technician under special circumstances. We apologize for any inconvenience.   Follow-Up: At [facility], you and your health needs are our priority.  As part of our continuing mission to provide you with exceptional heart care, our providers are all part of one team.  This team includes your primary  Cardiologist (physician) and Advanced Practice Providers or APPs (Physician Assistants and Nurse Practitioners) who all work together to provide you with the care you need, when you need it.  Your next appointment:   1 year(s)  Provider:   Stanly Leavens, MD

## 2023-12-12 ENCOUNTER — Encounter: Payer: Self-pay | Admitting: Internal Medicine

## 2023-12-12 NOTE — Patient Instructions (Addendum)
 Visit today for chronic pain management. He takes his pain med reliably and responsibly.. CPE has been scheduled for December but he is seen every 3 months for pain management and will need follow up in October for pain management.Hypertension is stable. Is on chronic anticoagulation for hs of DVT.

## 2023-12-16 ENCOUNTER — Other Ambulatory Visit: Payer: Self-pay

## 2023-12-16 MED ORDER — QUETIAPINE FUMARATE 50 MG PO TABS: 50.0000 mg | ORAL_TABLET | Freq: Every day | ORAL | 1 refills | Status: AC

## 2024-01-06 ENCOUNTER — Encounter: Payer: Self-pay | Admitting: Internal Medicine

## 2024-01-06 ENCOUNTER — Other Ambulatory Visit: Payer: Self-pay | Admitting: Internal Medicine

## 2024-01-06 DIAGNOSIS — M21961 Unspecified acquired deformity of right lower leg: Secondary | ICD-10-CM

## 2024-01-06 DIAGNOSIS — G8929 Other chronic pain: Secondary | ICD-10-CM

## 2024-01-06 MED ORDER — HYDROCODONE-ACETAMINOPHEN 10-325 MG PO TABS: ORAL_TABLET | ORAL | 0 refills | Status: DC

## 2024-01-06 NOTE — Telephone Encounter (Signed)
 Next refill due on 01/09/24 I have pended it to be fill then.

## 2024-01-06 NOTE — Telephone Encounter (Signed)
 Patient takes pain med as prescribed and keeps regular office visit follow up appts. Refill Norco 10/325 #90 tabs to take one tab every 6-8 hours as needed for pain. He will need follow up pain management appt in October and CPE has been booked for December   MJB, MD

## 2024-01-11 ENCOUNTER — Other Ambulatory Visit: Payer: Self-pay

## 2024-01-11 DIAGNOSIS — I824Y1 Acute embolism and thrombosis of unspecified deep veins of right proximal lower extremity: Secondary | ICD-10-CM

## 2024-01-11 DIAGNOSIS — I2782 Chronic pulmonary embolism: Secondary | ICD-10-CM

## 2024-01-11 DIAGNOSIS — N189 Chronic kidney disease, unspecified: Secondary | ICD-10-CM

## 2024-01-11 MED ORDER — APIXABAN 2.5 MG PO TABS: 2.5000 mg | ORAL_TABLET | Freq: Two times a day (BID) | ORAL | 1 refills | Status: AC

## 2024-01-19 DIAGNOSIS — Z08 Encounter for follow-up examination after completed treatment for malignant neoplasm: Secondary | ICD-10-CM | POA: Diagnosis not present

## 2024-01-19 DIAGNOSIS — Z85828 Personal history of other malignant neoplasm of skin: Secondary | ICD-10-CM | POA: Diagnosis not present

## 2024-01-27 ENCOUNTER — Other Ambulatory Visit: Payer: Self-pay

## 2024-01-27 MED ORDER — ZOLPIDEM TARTRATE 10 MG PO TABS: 10.0000 mg | ORAL_TABLET | Freq: Every day | ORAL | 0 refills | Status: DC

## 2024-02-08 ENCOUNTER — Telehealth: Payer: Self-pay | Admitting: Internal Medicine

## 2024-02-08 DIAGNOSIS — M21961 Unspecified acquired deformity of right lower leg: Secondary | ICD-10-CM

## 2024-02-08 DIAGNOSIS — G8929 Other chronic pain: Secondary | ICD-10-CM

## 2024-02-11 ENCOUNTER — Encounter: Payer: Self-pay | Admitting: Internal Medicine

## 2024-02-11 ENCOUNTER — Telehealth: Payer: Self-pay | Admitting: Internal Medicine

## 2024-02-11 MED ORDER — HYDROCODONE-ACETAMINOPHEN 10-325 MG PO TABS: ORAL_TABLET | ORAL | 0 refills | Status: DC

## 2024-02-11 NOTE — Telephone Encounter (Signed)
 Refilled Hydrocodone  Rx as requested today. Has f/u October. MJB, MD

## 2024-02-21 ENCOUNTER — Ambulatory Visit: Attending: Internal Medicine

## 2024-02-21 ENCOUNTER — Other Ambulatory Visit: Payer: Self-pay | Admitting: Internal Medicine

## 2024-02-21 ENCOUNTER — Ambulatory Visit
Admission: RE | Admit: 2024-02-21 | Discharge: 2024-02-21 | Disposition: A | Discharge status: Home / Self Care | Source: Ambulatory Visit | Attending: Internal Medicine | Admitting: Internal Medicine

## 2024-02-21 ENCOUNTER — Other Ambulatory Visit: Payer: Self-pay

## 2024-02-21 ENCOUNTER — Ambulatory Visit: Payer: Self-pay | Admitting: Internal Medicine

## 2024-02-21 ENCOUNTER — Encounter: Payer: Self-pay | Admitting: Internal Medicine

## 2024-02-21 ENCOUNTER — Ambulatory Visit (INDEPENDENT_AMBULATORY_CARE_PROVIDER_SITE_OTHER): Admitting: Internal Medicine

## 2024-02-21 VITALS — BP 120/70 | HR 84 | Temp 98.0°F | Ht 70.0 in

## 2024-02-21 DIAGNOSIS — R4020 Unspecified coma: Secondary | ICD-10-CM

## 2024-02-21 DIAGNOSIS — R55 Syncope and collapse: Secondary | ICD-10-CM

## 2024-02-21 DIAGNOSIS — Z7901 Long term (current) use of anticoagulants: Secondary | ICD-10-CM

## 2024-02-21 DIAGNOSIS — I350 Nonrheumatic aortic (valve) stenosis: Secondary | ICD-10-CM

## 2024-02-21 DIAGNOSIS — G8929 Other chronic pain: Secondary | ICD-10-CM

## 2024-02-21 DIAGNOSIS — E119 Type 2 diabetes mellitus without complications: Secondary | ICD-10-CM | POA: Diagnosis not present

## 2024-02-21 DIAGNOSIS — M79671 Pain in right foot: Secondary | ICD-10-CM

## 2024-02-21 DIAGNOSIS — I10 Essential (primary) hypertension: Secondary | ICD-10-CM

## 2024-02-21 DIAGNOSIS — D649 Anemia, unspecified: Secondary | ICD-10-CM | POA: Diagnosis not present

## 2024-02-21 DIAGNOSIS — R42 Dizziness and giddiness: Secondary | ICD-10-CM | POA: Diagnosis not present

## 2024-02-21 MED ORDER — CLONAZEPAM 0.5 MG PO TABS
ORAL_TABLET | ORAL | 1 refills | Status: AC
Start: 2024-02-21 — End: ?

## 2024-02-21 NOTE — Progress Notes (Shared)
 Patient Care Team: Perri Ronal PARAS, MD as PCP - General (Internal Medicine) Burnard Debby LABOR, MD (Inactive) as PCP - Cardiology (Cardiology) Freddie Lynwood HERO, MD as Consulting Physician (Oncology) Gearline Norris, MD as Consulting Physician (Nephrology)  Visit Date: 02/21/24  Subjective:    Patient ID: Roberto Reid , Male   DOB: 08-Jan-1933, 89 y.o.    MRN: [id number]   89 y.o. Male presents today for an office visit for a loss of consciousness while sitting down . Patient has a past medical history of Chronic foot deformity, Hypertension, DVT, Aortic valve stenosis, Chronic anticoagulation use.   Last week he was on a visit with family in [place] he [place] a brief episode of syncope. He said it did not last more than a few seconds, there was no seizure symptoms. He called out during the episode and when he came to he was alert. He had no focal deficits, no speech impairments, knew who he was. His memory did not seam to be impaired, He did not seem to be confused,  and even went on to eat dinner after it had happened.  He has no evidence of MI in recent past. He has not had any recent episodes of syncope.  He has never had a stroke and he has no known arrhythmias.      History of Hypertension Treated with Amlodipine  5 mg daily and Losartan  100 mg daily. Blood pressure today is normal at 120/60.   History of Chronic foot deformity; Secondary Chronic musculoskeletal/ right foot pain treated with Norco 10-325 every 6-8 hours as needed.   History of Non-Rhumatic Aortic valve stenosis: treated with Eliquis  2.5 mg twice daily. Followed by cardiologist Dr. Burnard.   10/14/2023 Echocardiogram Moderate aortic valve stenosis present.     02/21/2024 DG Chest view No acute cardiopulmonary process.    Past Medical History:  Diagnosis Date   Arthritis    right hand (06/15/2016)   Childhood asthma    Chronic anticoagulation 08/30/2017   DVT, lower extremity, proximal,  acute, right (HCC) 08/24/2016   06/15/16   GERD (gastroesophageal reflux disease)    Hearing loss    History of kidney stones    Hyperlipidemia    Hypertension    Pneumonia    I've had it ~ 9 times as an adult; last time was 04/2016 (06/15/2016)     Family History  Problem Relation Age of Onset   Parkinson's disease Father     Social History   Social History Narrative   Lives at home with wife   Right handed   Caffeine: 2 cups/day      Review of Systems  Constitutional:  Negative for fever and malaise/fatigue.  HENT:  Negative for congestion.   Eyes:  Negative for blurred vision.  Respiratory:  Negative for cough and shortness of breath.   Cardiovascular:  Negative for chest pain, palpitations and leg swelling.  Gastrointestinal:  Negative for vomiting.  Musculoskeletal:  Negative for back pain.  Skin:  Negative for rash.  Neurological:  Negative for loss of consciousness and headaches.        Objective:   Vitals: BP 120/70   Pulse 84   Temp 98 F (36.7 C)   Ht 5' 10 (1.778 m)   SpO2 97%   BMI 20.81 kg/m    Physical Exam Neck:     Vascular: No carotid bruit.  Cardiovascular:     Rate and Rhythm: Normal rate and  regular rhythm.     Pulses:          Dorsalis pedis pulses are 2+ on the right side and 2+ on the left side.     Heart sounds: Murmur heard.     Systolic murmur is present with a grade of 2/6.  Musculoskeletal:     Right lower leg: No edema.     Left lower leg: No edema.  Neurological:     General: No focal deficit present.     Mental Status: He is alert and oriented to person, place, and time.       Results:    10/14/2023 Echocardiogram Moderate aortic valve stenosis present.     02/21/2024 DG Chest view No acute cardiopulmonary process.   Labs:       Component Value Date/Time   NA 139 09/03/2022 1147   NA 138 08/23/2017 1101   K 4.6 09/03/2022 1147   CL 105 09/03/2022 1147   CO2 25 09/03/2022 1147   GLUCOSE 93 09/03/2022  1147   BUN 34 (H) 09/03/2022 1147   BUN 34 (H) 08/23/2017 1101   CREATININE 1.74 (H) 09/03/2022 1147   CALCIUM  8.9 09/03/2022 1147   PROT 6.4 09/03/2022 1147   ALBUMIN  3.5 08/24/2016 1519   AST 9 (L) 09/03/2022 1147   ALT 6 (L) 09/03/2022 1147   ALKPHOS 33 (L) 08/24/2016 1519   BILITOT 1.1 09/03/2022 1147   GFRNONAA 44 (L) 05/07/2020 1635   GFRAA 51 (L) 05/07/2020 1635     Lab Results  Component Value Date   WBC 6.3 09/15/2022   HGB 11.6 (L) 09/15/2022   HCT 34.4 (L) 09/15/2022   MCV 98.0 09/15/2022   PLT 220 09/15/2022    Lab Results  Component Value Date   CHOL 140 03/03/2022   HDL 46 03/03/2022   LDLCALC 78 03/03/2022   TRIG 75 03/03/2022   CHOLHDL 3.0 03/03/2022    Lab Results  Component Value Date   HGBA1C 5.3 03/03/2022     Lab Results  Component Value Date   TSH 3.79 04/07/2022     Lab Results  Component Value Date   PSA 0.42 03/03/2022   PSA 0.44 02/21/2021   PSA 0.45 02/19/2020       Assessment & Plan:   Orders Placed This Encounter  Procedures   MR Brain W Wo Contrast    Standing Status:   Future    Expiration Date:   02/20/2025    If indicated for the ordered procedure, I authorize the administration of contrast media per Radiology protocol:   Yes    What is the patient's sedation requirement?:   No Sedation    Does the patient have a pacemaker or implanted devices?:   No    Preferred imaging location?:   [facility] (table limit-550lbs)   DG Chest 2 View    Reason for Exam (SYMPTOM  OR DIAGNOSIS REQUIRED):   syncope last week in [place]    Preferred imaging location?:   [facility]   CBC with Differential/Platelet   Comprehensive metabolic panel with GFR   Hemoglobin A1c   PSA   TSH   T4, free   Ambulatory referral to Cardiology    Referral Priority:   Urgent    Referral Type:   Consultation    Referral Reason:   Specialty Services Required    Number of Visits Requested:   1   EKG 12-Lead    Loss of consciousness:  Last week  he was on a visit with wife in [place] and  while on ferry, he experienced a brief episode of syncope. He said it did not last more than a few seconds, there was no seizure symptoms. He called out during the episode and when he came to he was alert. He had no focal deficits, no speech impairments, knew who he was. His memory did not seam to be impaired, He did not seem to be confused,  and even went on to eat dinner after it had happened.  He has no  hx of MI in recent past. He has not had any more episodes of syncope.  He has never had a stroke and He has no known arrhythmias.      MR brain without contrast ordered and referred to Cardiology . Labs drawn and are pending including CBC with diff, C-met,TSH, PSA, Hgb AIC. Has Hgb 10.2 down from 11.6 in April 2024. Checking anemia studies.   Hypertension: treated with Amlodipine  5 mg daily and Losartan  100 mg daily. Blood pressure today is normal at 120/60     Chronic foot deformity; Secondary Chronic musculoskeletal/ right foot pain: treated with Norco 10-325 every 6-8 hours. Takes medication responsibly and reliably.  Non-Rhumatic Aortic valve stenosis: treated with Eliquis  2.5 mg twice daily. Followed by Cardiologist, Dr. Burnard. who recently retired.Would like for Cardiology to see him very soon for evaluation.   10/14/2023 Echocardiogram Moderate aortic valve stenosis present.     02/21/2024 DG Chest view No acute cardiopulmonary process.    I,Roberto Reid,acting as a scribe for Ronal JINNY Hailstone, MD.,have documented all relevant documentation on the behalf of Ronal JINNY Hailstone, MD,as directed by  Ronal JINNY Hailstone, MD while in the presence of Ronal JINNY Hailstone, MD.

## 2024-02-21 NOTE — Progress Notes (Unsigned)
 EP to read.

## 2024-02-21 NOTE — Patient Instructions (Addendum)
 Patient had an unexplained syncopal episode in [place] while riding a ferry to [place]. Episode was brief and he regained consciousness spontaneously. Went on to dinner and other activities. Did not seek medical attention. He is now here with his wife. Labs drawn today and are pending. EKG today similar to EKG July 2025. Zio monitor has beenordered. Refer back to cardiology for evaluation. MRI of brain with contrast ordered.

## 2024-02-22 ENCOUNTER — Other Ambulatory Visit: Payer: Self-pay

## 2024-02-22 DIAGNOSIS — D649 Anemia, unspecified: Secondary | ICD-10-CM

## 2024-02-23 ENCOUNTER — Encounter: Payer: Self-pay | Admitting: Internal Medicine

## 2024-02-23 LAB — B12 AND FOLATE PANEL
Folate: >24 ng/mL
Vitamin B-12: 511 pg/mL (ref 200–1100)

## 2024-02-23 LAB — IRON,TIBC AND FERRITIN PANEL
%SAT: 53 % — ABNORMAL HIGH (ref 20–48)
Ferritin: 305 ng/mL (ref 24–380)
Iron: 135 ug/dL (ref 50–180)
TIBC: 256 ug/dL (ref 250–425)

## 2024-02-23 LAB — CBC WITH DIFFERENTIAL/PLATELET
Absolute Lymphocytes: 1300 {cells}/uL (ref 850–3900)
Absolute Monocytes: 258 {cells}/uL (ref 200–950)
Basophils Absolute: 8 {cells}/uL (ref 0–200)
Basophils Relative: 0.2 %
Eosinophils Absolute: 70 {cells}/uL (ref 15–500)
Eosinophils Relative: 1.7 %
HCT: 30.5 % — ABNORMAL LOW (ref 38.5–50.0)
Hemoglobin: 10.2 g/dL — ABNORMAL LOW (ref 13.2–17.1)
MCH: 34.3 pg — ABNORMAL HIGH (ref 27.0–33.0)
MCHC: 33.4 g/dL (ref 32.0–36.0)
MCV: 102.7 fL — ABNORMAL HIGH (ref 80.0–100.0)
MPV: 11 fL (ref 7.5–12.5)
Monocytes Relative: 6.3 %
Neutro Abs: 2464 {cells}/uL (ref 1500–7800)
Neutrophils Relative %: 60.1 %
Platelets: 140 Thousand/uL (ref 140–400)
RBC: 2.97 Million/uL — ABNORMAL LOW (ref 4.20–5.80)
RDW: 13 % (ref 11.0–15.0)
Total Lymphocyte: 31.7 %
WBC: 4.1 Thousand/uL (ref 3.8–10.8)

## 2024-02-23 LAB — COMPREHENSIVE METABOLIC PANEL WITH GFR
AG Ratio: 1.9 (calc) (ref 1.0–2.5)
ALT: 9 U/L (ref 9–46)
AST: 12 U/L (ref 10–35)
Albumin: 4.5 g/dL (ref 3.6–5.1)
Alkaline phosphatase (APISO): 35 U/L (ref 35–144)
BUN/Creatinine Ratio: 21 (calc) (ref 6–22)
BUN: 32 mg/dL — ABNORMAL HIGH (ref 7–25)
CO2: 28 mmol/L (ref 20–32)
Calcium: 9.2 mg/dL (ref 8.6–10.3)
Chloride: 107 mmol/L (ref 98–110)
Creat: 1.55 mg/dL — ABNORMAL HIGH (ref 0.70–1.22)
Globulin: 2.4 g/dL (ref 1.9–3.7)
Glucose, Bld: 99 mg/dL (ref 65–99)
Potassium: 5.2 mmol/L (ref 3.5–5.3)
Sodium: 141 mmol/L (ref 135–146)
Total Bilirubin: 1 mg/dL (ref 0.2–1.2)
Total Protein: 6.9 g/dL (ref 6.1–8.1)
eGFR: 43 mL/min/1.73m2 — ABNORMAL LOW (ref >=60)

## 2024-02-23 LAB — PSA: PSA: 0.51 ng/mL (ref <=4.00)

## 2024-02-23 LAB — T4, FREE: Free T4: 1.1 ng/dL (ref 0.8–1.8)

## 2024-02-23 LAB — HEMOGLOBIN A1C
Hgb A1c MFr Bld: 5.4 % (ref <5.7)
Mean Plasma Glucose: 108 mg/dL
eAG (mmol/L): 6 mmol/L

## 2024-02-23 LAB — RETICULOCYTES
ABS Retic: 36480 {cells}/uL (ref 25000–90000)
Retic Ct Pct: 1.2 %

## 2024-02-23 LAB — TSH: TSH: 3.78 m[IU]/L (ref 0.40–4.50)

## 2024-02-23 LAB — TEST AUTHORIZATION

## 2024-02-25 NOTE — Telephone Encounter (Signed)
 Hydrocodone  refilled as requested.

## 2024-02-28 ENCOUNTER — Encounter: Payer: Self-pay | Admitting: Internal Medicine

## 2024-02-28 ENCOUNTER — Other Ambulatory Visit: Payer: Self-pay

## 2024-02-28 ENCOUNTER — Telehealth: Payer: Self-pay | Admitting: Internal Medicine

## 2024-02-28 DIAGNOSIS — R4020 Unspecified coma: Secondary | ICD-10-CM

## 2024-02-28 DIAGNOSIS — R55 Syncope and collapse: Secondary | ICD-10-CM

## 2024-02-28 NOTE — Telephone Encounter (Signed)
 Pt states he did have LOC for a short period of time during this episode on 02/16/24. Denies hitting head, pt was sitting down at time of LOC. No BP taken at time of incident, pt was traveling. Pt BP on the phone was 144/75 P 78. Pt advised to keep a log of BP readings over the next week to see if there is a trend. Explained that I would be sending this information to Dr. Santo to review and see if there are any further recommendations. Pt verbalized understanding.

## 2024-02-28 NOTE — Telephone Encounter (Signed)
 Pt c/o Syncope: STAT if syncope occurred within 24 hours and pt complains of lightheadedness  Did you pass out today? no  When is the last time you passed out? 02/16/24   Has this occurred multiple times? no   Did you have any symptoms prior to passing out? no   5. Did you fall? If so, are you on a blood thinner? yes    Pt calling about his passing out accident and would like to follow up with Dr Santo

## 2024-03-07 ENCOUNTER — Encounter: Payer: Self-pay | Admitting: Internal Medicine

## 2024-03-07 ENCOUNTER — Ambulatory Visit: Admitting: Internal Medicine

## 2024-03-07 VITALS — BP 130/80 | HR 72 | Ht 70.0 in | Wt 160.0 lb

## 2024-03-07 DIAGNOSIS — R6889 Other general symptoms and signs: Secondary | ICD-10-CM

## 2024-03-07 DIAGNOSIS — N189 Chronic kidney disease, unspecified: Secondary | ICD-10-CM

## 2024-03-07 DIAGNOSIS — R4020 Unspecified coma: Secondary | ICD-10-CM | POA: Diagnosis not present

## 2024-03-07 DIAGNOSIS — D649 Anemia, unspecified: Secondary | ICD-10-CM

## 2024-03-07 DIAGNOSIS — I952 Hypotension due to drugs: Secondary | ICD-10-CM | POA: Diagnosis not present

## 2024-03-07 DIAGNOSIS — I10 Essential (primary) hypertension: Secondary | ICD-10-CM

## 2024-03-07 DIAGNOSIS — M79671 Pain in right foot: Secondary | ICD-10-CM

## 2024-03-07 DIAGNOSIS — R55 Syncope and collapse: Secondary | ICD-10-CM | POA: Diagnosis not present

## 2024-03-07 DIAGNOSIS — I440 Atrioventricular block, first degree: Secondary | ICD-10-CM

## 2024-03-07 DIAGNOSIS — I441 Atrioventricular block, second degree: Secondary | ICD-10-CM

## 2024-03-07 DIAGNOSIS — Z87898 Personal history of other specified conditions: Secondary | ICD-10-CM

## 2024-03-07 DIAGNOSIS — G8929 Other chronic pain: Secondary | ICD-10-CM

## 2024-03-07 DIAGNOSIS — I350 Nonrheumatic aortic (valve) stenosis: Secondary | ICD-10-CM

## 2024-03-07 MED ORDER — AMLODIPINE BESYLATE 2.5 MG PO TABS: 2.5000 mg | ORAL_TABLET | Freq: Every day | ORAL | 0 refills | Status: DC

## 2024-03-07 NOTE — Progress Notes (Addendum)
 Patient Care Team: Perri Roberto PARAS, MD as PCP - General (Internal Medicine) Burnard Debby LABOR, MD (Inactive) as PCP - Cardiology (Cardiology) Freddie Lynwood HERO, MD as Consulting Physician (Oncology) Gearline Norris, MD as Consulting Physician (Nephrology)  Visit Date: 03/07/24  Subjective:    Patient ID: Roberto Reid , Male   DOB: Sep 17, 1932, 89 y.o.    MRN: [id number]   89 y.o. Male presents today for pain medication management. Patient has a past medical history of Hypertension. Chronic foot deformity, non-rhumatic aortic valve stenosis.  He was last here 02/21/2024 for a brief episode of syncope while he was traveling. He said it only lasted a few seconds. His memory did not seem to be impaired, He did not seem to be confused, and even went on to eat dinner after it had happened.  He has no evidence of MI in recent past. He has not had any recent episodes of syncope.  He has never had a stroke and he has no known arrhythmias.  MRI is planned for October  History of Hypertension treated with Amlodipine  5 mg daily and losartan  100 mg daily. Blood pressure today was normal at 130/80. He has been running low blood pressures in the evening. On 9/29 at 6:30 pm it was 92/45, on 10/03 at 9:39 pm it was 89/45, on 10/4 at 8:11 pm it was 87/43, on 10/5 at 7:16 pm it was 94/46, on 10/6 at 7:18 pm it was 82/44.   History of Chronic foot deformity; Secondary Chronic musculoskeletal/ right foot pain treated with Norco 10-325 every 6-8 hours as needed.    History of Non-Rhumatic Aortic valve stenosis: treated with Eliquis  2.5 mg twice daily. Followed by cardiologist Dr. Burnard.   Labs 02/21/2024 RBC 2.97, Hemoglobin 10.2, HCT 30.5, MCV 102.7, MCH 34.3, BUN 32, Creatinine 1.55, eGFR 43, Otherwise WNL.   03/07/2024 Zio Long term Monitor Data Patient had a min HR of 37 bpm, max HR of 105 bpm, and avg HR of 73 bpm. Predominant underlying rhythm was Sinus Rhythm. First Degree AV Block was present.  Slight P wave morphology changes were noted. Bundle Branch Block/ IVCD was present. Second Degree AV Block- Mobitz I ( Wenckebach) was present. Isolated SVEs were occasional ( 4. 8% , 19211) , SVE Couplets were occasional ( 1. 5% , 3023) , and SVE Triplets were rare ( < 1. 0% , 946) . Isolated VEs were rare ( < 1. 0% ) , and no VE Couplets or VE Triplets were present.   10/14/2023 Echocardiogram Moderate aortic valve stenosis present.      02/21/2024 DG Chest view No acute cardiopulmonary process.  Past Medical History:  Diagnosis Date   Arthritis    right hand (06/15/2016)   Childhood asthma    Chronic anticoagulation 08/30/2017   DVT, lower extremity, proximal, acute, right (HCC) 08/24/2016   06/15/16   GERD (gastroesophageal reflux disease)    Hearing loss    History of kidney stones    Hyperlipidemia    Hypertension    Pneumonia    I've had it ~ 9 times as an adult; last time was 04/2016 (06/15/2016)     Family History  Problem Relation Age of Onset   Parkinson's disease Father     Social History   Social History Narrative   Lives at home with wife   Right handed   Caffeine: 2 cups/day      Review of Systems  All other systems reviewed and are negative.  Objective:   Vitals: BP 130/80   Pulse 72   Ht 5' 10 (1.778 m)   Wt 160 lb (72.6 kg)   SpO2 96%   BMI 22.96 kg/m    Physical Exam Vitals and nursing note reviewed.       Results:   03/07/2024 Zio Long term Monitor Data Patient had a min HR of 37 bpm, max HR of 105 bpm, and avg HR of 73 bpm. Predominant underlying rhythm was Sinus Rhythm. First Degree AV Block was present. Slight P wave morphology changes were noted. Bundle Branch Block/ IVCD was present. Second Degree AV Block- Mobitz I ( Wenckebach) was present. Isolated SVEs were occasional ( 4. 8% , 19211) , SVE Couplets were occasional ( 1. 5% , 3023) , and SVE Triplets were rare ( < 1. 0% , 946) . Isolated VEs were rare ( < 1. 0% ) , and no VE  Couplets or VE Triplets were present.   10/14/2023 Echocardiogram Moderate aortic valve stenosis present.      02/21/2024 DG Chest view No acute cardiopulmonary process.    Labs:       Component Value Date/Time   NA 141 02/21/2024 1230   NA 138 08/23/2017 1101   K 5.2 02/21/2024 1230   CL 107 02/21/2024 1230   CO2 28 02/21/2024 1230   GLUCOSE 99 02/21/2024 1230   BUN 32 (H) 02/21/2024 1230   BUN 34 (H) 08/23/2017 1101   CREATININE 1.55 (H) 02/21/2024 1230   CALCIUM  9.2 02/21/2024 1230   PROT 6.9 02/21/2024 1230   ALBUMIN  3.5 08/24/2016 1519   AST 12 02/21/2024 1230   ALT 9 02/21/2024 1230   ALKPHOS 33 (L) 08/24/2016 1519   BILITOT 1.0 02/21/2024 1230   GFRNONAA 44 (L) 05/07/2020 1635   GFRAA 51 (L) 05/07/2020 1635     Lab Results  Component Value Date   WBC 4.1 02/21/2024   HGB 10.2 (L) 02/21/2024   HCT 30.5 (L) 02/21/2024   MCV 102.7 (H) 02/21/2024   PLT 140 02/21/2024    Lab Results  Component Value Date   CHOL 140 03/03/2022   HDL 46 03/03/2022   LDLCALC 78 03/03/2022   TRIG 75 03/03/2022   CHOLHDL 3.0 03/03/2022    Lab Results  Component Value Date   HGBA1C 5.4 02/21/2024     Lab Results  Component Value Date   TSH 3.78 02/21/2024     Lab Results  Component Value Date   PSA 0.51 02/21/2024   PSA 0.42 03/03/2022   PSA 0.44 02/21/2021         Assessment & Plan:   He was last here 02/21/2024 for a brief episode of syncope while he was traveling. He said it only lasted a few seconds. His memory did not seem to be impaired, He did not seem to be confused, and even went on to eat dinner after it had happened.  He has no evidence of MI in recent past. He has not had any recent episodes of syncope.  He has never had a stroke and he has no known arrhythmias.  Zio monitor has been sent to Cardiology to read a few days ago now, No result yet. He asked about results.  Today he repeated himself several times which is unusual for him and asked me the same  questions over a few times which is not like him. B12 and folate have been checked.These are normal.   He has MRI of brain scheduled for  October 22. Free T4 and TSH were normal  He is on chronic pain management with Norco. May need to reduce dose to see if it is causing some memory issues.  Hypertension treated with Amlodipine  5 mg daily and Losartan  100 mg daily. Blood pressure today was normal at 130/80.   He has recently been hypotensive during the evenings. On 9/29 at 6:30 pm his blood pressure was 92/45, on 10/03 at 9:39 pm it was 89/45, on 10/4 at 8:11 pm it was 87/43, on 10/5 at 7:16 pm it was 94/46, on 10/6 at 7:18 pm it was 82/44.     Decreasing Amlodipine  from 5 mg daily to 2.5 mg daily.   Macrocytic anemia B12 and folate are normal despite elevated MCV. Iron level is normal. I asked him to do 2 hemoccult cards   Chronic foot deformity; Secondary Chronic musculoskeletal/ right foot pain: treated with Norco 10-325 every 6-8 hours as needed.    Non-Rhumatic Aortic valve stenosis: treated with Eliquis  2.5 mg twice daily. Followed by cardiologist Dr. Burnard.   03/07/2024 Zio Long term Monitor Data Patient had a min HR of 37 bpm, max HR of 105 bpm, and avg HR of 73 bpm. Predominant underlying rhythm was Sinus Rhythm. First Degree AV Block was present. Slight P wave morphology changes were noted. Bundle Branch Block/ IVCD was present. Second Degree AV Block- Mobitz I ( Wenckebach) was present. Isolated SVEs were occasional ( 4. 8% , 19211) , SVE Couplets were occasional ( 1. 5% , 3023) , and SVE Triplets were rare ( < 1. 0% , 946) . Isolated VEs were rare ( < 1. 0% ) , and no VE Couplets or VE Triplets were present.   10/14/2023 Echocardiogram Moderate aortic valve stenosis present.      02/21/2024 DG Chest view No acute cardiopulmonary process.     I,Makayla C Reid,acting as a scribe for Roberto JINNY Hailstone, MD.,have documented all relevant documentation on the behalf of Roberto JINNY Hailstone, MD,as  directed by  Roberto JINNY Hailstone, MD while in the presence of Roberto JINNY Hailstone, MD.

## 2024-03-08 ENCOUNTER — Other Ambulatory Visit: Payer: Self-pay

## 2024-03-08 DIAGNOSIS — I350 Nonrheumatic aortic (valve) stenosis: Secondary | ICD-10-CM

## 2024-03-08 DIAGNOSIS — R55 Syncope and collapse: Secondary | ICD-10-CM

## 2024-03-08 DIAGNOSIS — I10 Essential (primary) hypertension: Secondary | ICD-10-CM

## 2024-03-08 NOTE — Patient Instructions (Addendum)
 Complete hemoccult cards as requested. Take iron supplement twice daily with food. Have MRI of brain. Pain med refilled. Be careful not to take too much pain medication.

## 2024-03-08 NOTE — Progress Notes (Signed)
 Called pt advised of MD recommendations:  Roberto Reid LABOR, MD  [name], Roberto PARAS, MD; Roberto Hamp SAILOR, RN; Waddell Danelle ORN, MD Heart monitor looks ok.  Lets do this. - I will order a coronary artery calcium  score largely to look at hist AV calcium  score - if he syncope resolve with improvement in his BP won't make any changes - if not, I'll see him after this study to make sure there is not a cardiac cause of his syncope (monitor did not suggest electrical etiology)  Pt reports the following BP readings:  AM/PM 10/4  112/58    87/43 10/5   missed   94/46 10/6   139/75    82/44  Reports feels fine with PM BP readings.  Reports drinks plenty of fluids.  Denies lightheadedness, dizziness and feeling like will pass out.   Takes amlodipine  2.5 mg every day, losartan  100 mg every day denies taking sildenafil .     03/09/24-Left a message to call back.  Would like to advised of MD recommendation: I would stop the amlodipine ; I do think this dizziness PCP notes is probably hypotension mediated   03/09/24- Spoke with pt advised of MD recommendation.  Advised to continue to monitor BP and notify our office if BP continues to be low. Pt expresses understanding.

## 2024-03-09 LAB — DRUG MONITORING, PANEL 8 WITH CONFIRMATION, URINE
6 Acetylmorphine: NEGATIVE ng/mL (ref <10)
Alcohol Metabolites: POSITIVE ng/mL — AB (ref <500)
Amphetamines: NEGATIVE ng/mL (ref <500)
Benzodiazepines: NEGATIVE ng/mL (ref <100)
Buprenorphine, Urine: NEGATIVE ng/mL (ref <5)
Cocaine Metabolite: NEGATIVE ng/mL (ref <150)
Codeine: NEGATIVE ng/mL (ref <50)
Creatinine: 113.8 mg/dL (ref >=20.0)
Ethyl Glucuronide (ETG): 17270 ng/mL — ABNORMAL HIGH (ref <500)
Ethyl Sulfate (ETS): 5948 ng/mL — ABNORMAL HIGH (ref <100)
Hydrocodone: 2103 ng/mL — ABNORMAL HIGH (ref <50)
Hydromorphone: 396 ng/mL — ABNORMAL HIGH (ref <50)
MDMA: NEGATIVE ng/mL (ref <500)
Marijuana Metabolite: NEGATIVE ng/mL (ref <20)
Morphine: NEGATIVE ng/mL (ref <50)
Norhydrocodone: 1778 ng/mL — ABNORMAL HIGH (ref <50)
Opiates: POSITIVE ng/mL — AB (ref <100)
Oxidant: NEGATIVE ug/mL (ref <200)
Oxycodone: NEGATIVE ng/mL (ref <100)
pH: 5.3 (ref 4.5–9.0)

## 2024-03-09 LAB — DM TEMPLATE

## 2024-03-09 NOTE — Addendum Note (Signed)
 Addended by: RANDY HAMP SAILOR on: 03/09/2024 12:11 PM   Modules accepted: Orders

## 2024-03-10 ENCOUNTER — Ambulatory Visit
Admission: RE | Admit: 2024-03-10 | Discharge: 2024-03-10 | Disposition: A | Discharge status: Home / Self Care | Source: Ambulatory Visit | Attending: Internal Medicine | Admitting: Internal Medicine

## 2024-03-10 ENCOUNTER — Other Ambulatory Visit

## 2024-03-10 ENCOUNTER — Ambulatory Visit (INDEPENDENT_AMBULATORY_CARE_PROVIDER_SITE_OTHER)

## 2024-03-10 VITALS — BP 130/88 | HR 82 | Ht 70.0 in | Wt 160.0 lb

## 2024-03-10 DIAGNOSIS — R6889 Other general symptoms and signs: Secondary | ICD-10-CM | POA: Diagnosis not present

## 2024-03-10 DIAGNOSIS — D539 Nutritional anemia, unspecified: Secondary | ICD-10-CM

## 2024-03-10 DIAGNOSIS — G319 Degenerative disease of nervous system, unspecified: Secondary | ICD-10-CM | POA: Diagnosis not present

## 2024-03-10 DIAGNOSIS — D649 Anemia, unspecified: Secondary | ICD-10-CM

## 2024-03-10 DIAGNOSIS — Z7901 Long term (current) use of anticoagulants: Secondary | ICD-10-CM

## 2024-03-10 DIAGNOSIS — R55 Syncope and collapse: Secondary | ICD-10-CM

## 2024-03-10 DIAGNOSIS — R4020 Unspecified coma: Secondary | ICD-10-CM

## 2024-03-10 MED ORDER — GADOPICLENOL 0.5 MMOL/ML IV SOLN
7.5000 mL | Freq: Once | INTRAVENOUS | Status: AC | PRN
  Administered 2024-03-10: 7.5 mL via INTRAVENOUS

## 2024-03-10 MED ORDER — CYANOCOBALAMIN 1000 MCG/ML IJ SOLN
1000.0000 ug | Freq: Once | INTRAMUSCULAR | Status: AC
  Administered 2024-03-10: 1000 ug via INTRAMUSCULAR

## 2024-03-10 NOTE — Progress Notes (Signed)
 Lab only

## 2024-03-10 NOTE — Progress Notes (Signed)
 Patient presents to the clinic for a B12 Injection. Patient received a B12 injection, IM left upper quadrant. Patient tolerated well.

## 2024-03-11 LAB — CBC WITH DIFFERENTIAL/PLATELET
Absolute Lymphocytes: 1490 {cells}/uL (ref 850–3900)
Absolute Monocytes: 241 {cells}/uL (ref 200–950)
Basophils Absolute: 22 {cells}/uL (ref 0–200)
Basophils Relative: 0.6 %
Eosinophils Absolute: 112 {cells}/uL (ref 15–500)
Eosinophils Relative: 3.1 %
HCT: 30.1 % — ABNORMAL LOW (ref 38.5–50.0)
Hemoglobin: 10.1 g/dL — ABNORMAL LOW (ref 13.2–17.1)
MCH: 34.5 pg — ABNORMAL HIGH (ref 27.0–33.0)
MCHC: 33.6 g/dL (ref 32.0–36.0)
MCV: 102.7 fL — ABNORMAL HIGH (ref 80.0–100.0)
MPV: 11 fL (ref 7.5–12.5)
Monocytes Relative: 6.7 %
Neutro Abs: 1735 {cells}/uL (ref 1500–7800)
Neutrophils Relative %: 48.2 %
Platelets: 157 Thousand/uL (ref 140–400)
RBC: 2.93 Million/uL — ABNORMAL LOW (ref 4.20–5.80)
RDW: 13 % (ref 11.0–15.0)
Total Lymphocyte: 41.4 %
WBC: 3.6 Thousand/uL — ABNORMAL LOW (ref 3.8–10.8)

## 2024-03-11 LAB — MAGNESIUM: Magnesium: 2.2 mg/dL (ref 1.5–2.5)

## 2024-03-13 ENCOUNTER — Encounter: Payer: Self-pay | Admitting: Internal Medicine

## 2024-03-13 ENCOUNTER — Ambulatory Visit: Payer: Self-pay | Admitting: Internal Medicine

## 2024-03-13 ENCOUNTER — Telehealth: Payer: Self-pay | Admitting: Internal Medicine

## 2024-03-13 DIAGNOSIS — I69319 Unspecified symptoms and signs involving cognitive functions following cerebral infarction: Secondary | ICD-10-CM

## 2024-03-13 DIAGNOSIS — D539 Nutritional anemia, unspecified: Secondary | ICD-10-CM

## 2024-03-13 NOTE — Telephone Encounter (Signed)
 Have spoken with his wife about MRI results and anemia. Referring to Neurology to evaluate chronc infarcts and recent new finding of repeating himself a lot.  Also, Referring to hematology after doing anemia studies. Has low normal B12 and gave him one dose of B12 IM last week. Has macrocytosis.

## 2024-03-13 NOTE — Telephone Encounter (Signed)
 See notes.

## 2024-03-13 NOTE — Telephone Encounter (Signed)
 Referring to Hematology and Neurology to evaluate anemia with macrocytosis but normal B12 level and to neurology for multiple chronic infarcts. Recent Zio monitor did not show malignant arrythmias.

## 2024-03-14 ENCOUNTER — Telehealth: Payer: Self-pay | Admitting: Internal Medicine

## 2024-03-14 DIAGNOSIS — G8929 Other chronic pain: Secondary | ICD-10-CM

## 2024-03-14 MED ORDER — HYDROCODONE-ACETAMINOPHEN 10-325 MG PO TABS: ORAL_TABLET | ORAL | 0 refills | Status: DC

## 2024-03-14 NOTE — Telephone Encounter (Signed)
 Refill Norco 10/325 today for chronic foot pain. Pt recently seen here. MJB, MD

## 2024-04-04 ENCOUNTER — Encounter: Payer: Self-pay | Admitting: Internal Medicine

## 2024-04-04 ENCOUNTER — Ambulatory Visit: Admitting: Internal Medicine

## 2024-04-04 VITALS — Ht 70.0 in | Wt 160.0 lb

## 2024-04-04 DIAGNOSIS — M21961 Unspecified acquired deformity of right lower leg: Secondary | ICD-10-CM

## 2024-04-04 DIAGNOSIS — J22 Unspecified acute lower respiratory infection: Secondary | ICD-10-CM

## 2024-04-04 DIAGNOSIS — R059 Cough, unspecified: Secondary | ICD-10-CM | POA: Diagnosis not present

## 2024-04-04 DIAGNOSIS — Z7901 Long term (current) use of anticoagulants: Secondary | ICD-10-CM | POA: Diagnosis not present

## 2024-04-04 DIAGNOSIS — Z86711 Personal history of pulmonary embolism: Secondary | ICD-10-CM

## 2024-04-04 DIAGNOSIS — I10 Essential (primary) hypertension: Secondary | ICD-10-CM

## 2024-04-04 DIAGNOSIS — G8929 Other chronic pain: Secondary | ICD-10-CM

## 2024-04-04 MED ORDER — LEVOFLOXACIN 500 MG PO TABS: 500.0000 mg | ORAL_TABLET | Freq: Every day | ORAL | 0 refills | Status: DC

## 2024-04-04 MED ORDER — CEFTRIAXONE SODIUM 1 G IJ SOLR
1.0000 g | Freq: Once | INTRAMUSCULAR | Status: AC
  Administered 2024-04-04: 1 g via INTRAMUSCULAR

## 2024-04-04 NOTE — Progress Notes (Signed)
 Patient Care Team: Perri Ronal PARAS, MD as PCP - General (Internal Medicine) Burnard Debby LABOR, MD (Inactive) as PCP - Cardiology (Cardiology) Freddie Lynwood HERO, MD as Consulting Physician (Oncology) Gearline Norris, MD as Consulting Physician (Nephrology)  Visit Date: 04/04/24  Subjective:    Patient ID: Roberto Reid , Male   DOB: [birth year], 89 y.o.    MRN: [id number]   89 y.o. Male presents today for cough and respiratory infection symptoms.. Patient has a past medical history of Hypertension, Non-rhumatic aortic valve stenosis, hx of DVT in 2018, Hyperlipidemia, GERD,chronic foot pain requiring narcotic pain medication daily. Hx DVT involving RLE 2018. Hx RBBB, mild glucose intolerance, insomnia, hx of Barrett's esophagus,adenomatous colon polyp. Remote hx of kidney stone. 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  Hx chronic insomnia and anxiety.  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 Remote hx of ankle fx 1953 due to football injury. Hx cellulitis right leg related to apparent spider bite in 2008 and venous insufficiency. Hx of CKD.  Due for Medicare wellness visit December 2025. Takes Norco 10/325 for chronic pain due to foot deformity.  He recently traveled to [place]  to see his grandchildren. When he came back he became ill. He said he has been feeling unwell for a couple days. He is experiencing a cough and chest congestion. He denies having Covid-19.   Past Medical History:  Diagnosis Date   Arthritis    right hand (06/15/2016)   Childhood asthma    Chronic anticoagulation 08/30/2017   DVT, lower extremity, proximal, acute, right (HCC) 08/24/2016   06/15/16   GERD (gastroesophageal reflux disease)    Hearing loss    History of kidney stones    Hyperlipidemia     Hypertension    Pneumonia    I've had it ~ 9 times as an adult; last time was 04/2016 (06/15/2016)     Family History  Problem Relation Age of Onset   Parkinson's disease Father     Social History   Social History Narrative   Lives at home with wife   Right handed   Caffeine: 2 cups/day   Retired TEACHER, 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 Has continued with lab consulting. Married,currently nonsmoker. Has a Masters degree and is a former Counsellor.2 children, a son and a daughter in good health.  Is seen very 3 months for chronic pain management.  Family Hx: father died at age 75 from Parkinson's disease complications. Father had diabetes.Mother died at age 72 of unknown causes. 2 sisters.   Review of Systems  HENT:  Positive for congestion.   Respiratory:  Positive for cough.         Objective:   Vitals: Ht 5' 10 (1.778 m)   Wt 160 lb (72.6 kg)   BMI 22.96 kg/m    Physical Exam Vitals and nursing note reviewed.  Constitutional:      General: He is not in acute distress.    Appearance: Normal appearance. He is not ill-appearing.  HENT:     Head: Normocephalic and atraumatic.     Right Ear: Tympanic membrane, ear canal and external ear normal.     Left Ear: Tympanic membrane, ear canal and external ear normal.     Mouth/Throat:     Mouth: Mucous membranes are  moist.     Pharynx: Oropharynx is clear. No oropharyngeal exudate or posterior oropharyngeal erythema.     Comments: Pharynx slightly injected.  Pulmonary:     Effort: Pulmonary effort is normal.     Breath sounds: Normal breath sounds. No wheezing, rhonchi or rales.  Lymphadenopathy:     Cervical: No cervical adenopathy.  Skin:    General: Skin is warm and dry.  Neurological:     Mental Status: He is alert and oriented to person, place, and time. Mental status is at baseline.  Psychiatric:        Mood and Affect: Mood normal.        Behavior: Behavior normal.         Thought Content: Thought content normal.        Judgment: Judgment normal.       Results:     Labs:       Component Value Date/Time   NA 141 02/21/2024 1230   NA 138 08/23/2017 1101   K 5.2 02/21/2024 1230   CL 107 02/21/2024 1230   CO2 28 02/21/2024 1230   GLUCOSE 99 02/21/2024 1230   BUN 32 (H) 02/21/2024 1230   BUN 34 (H) 08/23/2017 1101   CREATININE 1.55 (H) 02/21/2024 1230   CALCIUM  9.2 02/21/2024 1230   PROT 6.9 02/21/2024 1230   ALBUMIN  3.5 08/24/2016 1519   AST 12 02/21/2024 1230   ALT 9 02/21/2024 1230   ALKPHOS 33 (L) 08/24/2016 1519   BILITOT 1.0 02/21/2024 1230   GFRNONAA 44 (L) 05/07/2020 1635   GFRAA 51 (L) 05/07/2020 1635     Lab Results  Component Value Date   WBC 3.6 (L) 03/10/2024   HGB 10.1 (L) 03/10/2024   HCT 30.1 (L) 03/10/2024   MCV 102.7 (H) 03/10/2024   PLT 157 03/10/2024    Lab Results  Component Value Date   CHOL 140 03/03/2022   HDL 46 03/03/2022   LDLCALC 78 03/03/2022   TRIG 75 03/03/2022   CHOLHDL 3.0 03/03/2022    Lab Results  Component Value Date   HGBA1C 5.4 02/21/2024     Lab Results  Component Value Date   TSH 3.78 02/21/2024     Lab Results  Component Value Date   PSA 0.51 02/21/2024   PSA 0.42 03/03/2022   PSA 0.44 02/21/2021     Assessment & Plan:   Meds ordered this encounter  Medications   levofloxacin  (LEVAQUIN ) 500 MG tablet    Sig: Take 1 tablet (500 mg total) by mouth daily.    Dispense:  10 tablet    Refill:  0    Discontinue Seroquel  while taking Levaquin    cefTRIAXone  (ROCEPHIN ) injection 1 g    Acute Lower Respiratory Infection: He recently traveled to [place]  to see his grandchildren. When he came back he became ill.He said he has been sick for a couple days. He is experiencing a cough and chest congestion. He denies having Covid-19.    Rocephin  1 g IM injection given in office.  Levaquin  500 mg daily prescribed for 10 days.    Rest at home and stay well hydrated. Call if not better  in 48 hours or sooner if worse.  I,Makayla C Reid,acting as a scribe for Ronal JINNY Hailstone, MD.,have documented all relevant documentation on the behalf of Ronal JINNY Hailstone, MD,as directed by  Ronal JINNY Hailstone, MD while in the presence of Ronal JINNY Hailstone, MD.  I, Ronal JINNY Hailstone, MD, have reviewed all  documentation for this visit. The documentation on 04/04/2024 for the exam, diagnosis, procedures, and orders are all accurate and complete.

## 2024-04-12 ENCOUNTER — Telehealth: Payer: Self-pay | Admitting: Internal Medicine

## 2024-04-12 ENCOUNTER — Encounter: Payer: Self-pay | Admitting: Internal Medicine

## 2024-04-12 DIAGNOSIS — G8929 Other chronic pain: Secondary | ICD-10-CM

## 2024-04-12 DIAGNOSIS — M21961 Unspecified acquired deformity of right lower leg: Secondary | ICD-10-CM

## 2024-04-12 MED ORDER — HYDROCODONE-ACETAMINOPHEN 10-325 MG PO TABS: ORAL_TABLET | ORAL | 0 refills | Status: DC

## 2024-04-12 NOTE — Telephone Encounter (Signed)
 Refill Norco 10/325 (#90 tabs) to take one tab every 6-8 hours as needed for chronic pain. Patient had recent OV here. Has visit scheduled in December for medicare wellness and CPE.  MJB, MD

## 2024-04-12 NOTE — Patient Instructions (Signed)
 Patient has an acute lower respiratory infection and was treated with Rocephin  1 g IM in the office and prescribed Levaquin  500 milligrams daily for 10 days.  Rest and stay well-hydrated .Call if not better in 48 hours or sooner if worse

## 2024-04-20 DIAGNOSIS — I361 Nonrheumatic tricuspid (valve) insufficiency: Secondary | ICD-10-CM | POA: Insufficient documentation

## 2024-04-20 DIAGNOSIS — I350 Nonrheumatic aortic (valve) stenosis: Secondary | ICD-10-CM | POA: Insufficient documentation

## 2024-04-20 NOTE — Progress Notes (Signed)
  Cardiology Office Note:   Date:  04/21/2024  ID:  Roberto Reid, DOB 04-05-1933, MRN [id number] PCP: Roberto Ronal PARAS, MD  [facility] Providers Cardiologist:  Roberto Schilling, MD {  History of Present Illness:   Roberto Reid is a 89 y.o. male with moderate aortic stenosis.  She has a history of RBBB.  He has moderate aortic stenosis he is a bradycardia.  He had first-degree AV block with bifascicular block and history of SVT and was evaluated previously by electrophysiologist in 2024 but not thought to require pacing.   He returns today for follow up.  This is my first visit with him.  Since he was last seen in the office .  He says he has been doing well.  He does a little bit of a stationary bicycle.  He grocery shops.  He says he has no chest pressure, neck or arm discomfort.  He has no shortness of breath, PND or orthopnea.  He did say he had a very brief episode of syncope.  He was traveling up in [place].  He sat down at a table after taking a ferry ride and he had just a few seconds of loss of consciousness.  He has not experienced this since or before.  He denies any orthostatic symptoms.   ROS: As stated in the HPI and negative for all other systems.  Studies Reviewed:    EKG:   EKG Interpretation Date/Time:  Friday April 21 2024 08:53:38 EST Ventricular Rate:  77 PR Interval:  244 QRS Duration:  146 QT Interval:  398 QTC Calculation: 450 R Axis:   87  Text Interpretation: Sinus rhythm with 1st degree A-V block with Premature atrial complexes Right bundle branch block When compared with ECG of 21-Apr-2024 08:53, Premature atrial complexes are now Present Confirmed by Reid Roberto ([id number]) on 04/21/2024 8:55:57 AM    Risk Assessment/Calculations:              Physical Exam:   VS:  BP 126/78   Pulse 100   Ht 5' 10 (1.778 m)   Wt 160 lb 9.6 oz (72.8 kg)   SpO2 97%   BMI 23.04 kg/m    Wt Readings from Last 3 Encounters:  04/21/24 160 lb 9.6 oz (72.8  kg)  04/04/24 160 lb (72.6 kg)  03/10/24 160 lb (72.6 kg)     GEN: Well nourished, well developed in no acute distress NECK: No JVD; No carotid bruits CARDIAC: RRR, 2 out of 6 apical systolic murmur radiating out the aortic outflow tract and mid peaking, no diastolic murmurs, rubs, gallops RESPIRATORY:  Clear to auscultation without rales, wheezing or rhonchi  ABDOMEN: Soft, non-tender, non-distended EXTREMITIES:  No edema; No deformity   ASSESSMENT AND PLAN:   Moderate aortic stenosis:   The patient needs echocardiogram.    Moderate tricuspid regurgitation: This will be evaluated as above.   Right bundle branch block and first degree heart block: He did have a brief episode of what sounds like a loss of consciousness.  This was a few months ago.  She has had no symptoms since then.  I am going to have him wear a 2-week monitor.  He was not orthostatic in the office today.   Hyperlipidemia: LDL was 78 with an HDL of 46.  No change in therapy.    Follow up with me in 6 months.  Signed, Roberto Schilling, MD

## 2024-04-21 ENCOUNTER — Ambulatory Visit: Attending: Cardiology | Admitting: Cardiology

## 2024-04-21 ENCOUNTER — Ambulatory Visit: Attending: Cardiology

## 2024-04-21 ENCOUNTER — Encounter: Payer: Self-pay | Admitting: Cardiology

## 2024-04-21 VITALS — BP 126/78 | HR 100 | Ht 70.0 in | Wt 160.6 lb

## 2024-04-21 DIAGNOSIS — E785 Hyperlipidemia, unspecified: Secondary | ICD-10-CM | POA: Diagnosis not present

## 2024-04-21 DIAGNOSIS — I4510 Unspecified right bundle-branch block: Secondary | ICD-10-CM

## 2024-04-21 DIAGNOSIS — I350 Nonrheumatic aortic (valve) stenosis: Secondary | ICD-10-CM

## 2024-04-21 DIAGNOSIS — I361 Nonrheumatic tricuspid (valve) insufficiency: Secondary | ICD-10-CM | POA: Diagnosis not present

## 2024-04-21 NOTE — Progress Notes (Unsigned)
 Enrolled patient for a 14 day Zio XT  monitor to be mailed to patients home

## 2024-04-21 NOTE — Patient Instructions (Addendum)
 Medication Instructions:  Your physician recommends that you continue on your current medications as directed. Please refer to the Current Medication list given to you today.  *If you need a refill on your cardiac medications before your next appointment, please call your pharmacy*  Testing/Procedures: Echocardiogram  Your physician has requested that you have an echocardiogram. Echocardiography is a painless test that uses sound waves to create images of your heart. It provides your doctor with information about the size and shape of your heart and how well your heart's chambers and valves are working. This procedure takes approximately one hour. There are no restrictions for this procedure. Please do NOT wear cologne, perfume, aftershave, or lotions (deodorant is allowed). Please arrive 15 minutes prior to your appointment time.  Please note: We ask at that you not bring children with you during ultrasound (echo/ vascular) testing. Due to room size and safety concerns, children are not allowed in the ultrasound rooms during exams. Our front office staff cannot provide observation of children in our lobby area while testing is being conducted. An adult accompanying a patient to their appointment will only be allowed in the ultrasound room at the discretion of the ultrasound technician under special circumstances. We apologize for any inconvenience.  Event Monitor  Your physician has recommended that you wear an event monitor. Event monitors are medical devices that record the heart's electrical activity. Doctors most often us  these monitors to diagnose arrhythmias. Arrhythmias are problems with the speed or rhythm of the heartbeat. The monitor is a small, portable device. You can wear one while you do your normal daily activities. This is usually used to diagnose what is causing palpitations/syncope (passing out).   Follow-Up: At [facility], you and your health needs are our priority.   As part of our continuing mission to provide you with exceptional heart care, our providers are all part of one team.  This team includes your primary Cardiologist (physician) and Advanced Practice Providers or APPs (Physician Assistants and Nurse Practitioners) who all work together to provide you with the care you need, when you need it.  Your next appointment:   6 month(s)  Provider:   Lynwood Schilling, MD     GEOFFRY HEWS- Long Term Monitor Instructions  Your physician has requested you wear a ZIO patch monitor for 14 days.  This is a single patch monitor. [employer] supplies one patch monitor per enrollment. Additional stickers are not available. Please do not apply patch if you will be having a Nuclear Stress Test,  Echocardiogram, Cardiac CT, MRI, or Chest Xray during the period you would be wearing the  monitor. The patch cannot be worn during these tests. You cannot remove and re-apply the  ZIO XT patch monitor.  Your ZIO patch monitor will be mailed 3 day USPS to your address on file. It may take 3-5 days  to receive your monitor after you have been enrolled.  Once you have received your monitor, please review the enclosed instructions. Your monitor  has already been registered assigning a specific monitor serial # to you.  Billing and Patient Assistance Program Information  We have supplied [employer] with any of your insurance information on file for billing purposes. [employer] offers a sliding scale Patient Assistance Program for patients that do not have  insurance, or whose insurance does not completely cover the cost of the ZIO monitor.  You must apply for the Patient Assistance Program to qualify for this discounted rate.  To apply, please  call [employer] at [phone], select option 4, select option 2, ask to apply for  Patient Assistance Program. Meredeth will ask your household income, and how many people  are in your household. They will quote your out-of-pocket cost based on that  information.  [employer] will also be able to set up a 23-month, interest-free payment plan if needed.  Applying the monitor   Shave hair from upper left chest.  Hold abrader disc by orange tab. Rub abrader in 40 strokes over the upper left chest as  indicated in your monitor instructions.  Clean area with 4 enclosed alcohol pads. Let dry.  Apply patch as indicated in monitor instructions. Patch will be placed under collarbone on left  side of chest with arrow pointing upward.  Rub patch adhesive wings for 2 minutes. Remove white label marked 1. Remove the white  label marked 2. Rub patch adhesive wings for 2 additional minutes.  While looking in a mirror, press and release button in center of patch. A small green light will  flash 3-4 times. This will be your only indicator that the monitor has been turned on.  Do not shower for the first 24 hours. You may shower after the first 24 hours.  Press the button if you feel a symptom. You will hear a small click. Record Date, Time and  Symptom in the Patient Logbook.  When you are ready to remove the patch, follow instructions on the last 2 pages of Patient  Logbook. Stick patch monitor onto the last page of Patient Logbook.  Place Patient Logbook in the blue and white box. Use locking tab on box and tape box closed  securely. The blue and white box has prepaid postage on it. Please place it in the mailbox as  soon as possible. Your physician should have your test results approximately 7 days after the  monitor has been mailed back to [facility].  Call [facility] Customer Care at [phone] if you have questions regarding  your ZIO XT patch monitor. Call them immediately if you see an orange light blinking on your  monitor.  If your monitor falls off in less than 4 days, contact our Monitor department at [phone].  If your monitor becomes loose or falls off after 4 days call [employer] at [phone] for  suggestions on  securing your monitor

## 2024-04-25 ENCOUNTER — Other Ambulatory Visit: Payer: Self-pay

## 2024-04-25 MED ORDER — ZOLPIDEM TARTRATE 10 MG PO TABS: 10.0000 mg | ORAL_TABLET | Freq: Every day | ORAL | 0 refills | Status: AC

## 2024-04-25 NOTE — Telephone Encounter (Signed)
 Last refill 01/27/24 #90

## 2024-05-01 NOTE — Progress Notes (Addendum)
 "  Annual Wellness Visit   Patient Care Team: Milia Warth, Ronal PARAS, MD as PCP - General (Internal Medicine) Lynwood Schilling, MD as PCP - Cardiology (Cardiology) Freddie Lynwood HERO, MD as Consulting Physician (Oncology) Gearline Norris, MD as Consulting Physician (Nephrology)  Visit Date: 05/15/2024   Chief Complaint  Patient presents with   Annual Exam   Medicare Wellness   Subjective:  Patient: Roberto Reid, Male DOB: Jul 20, 1932, 89 y.o. MRN: [id number] Vitals:   05/15/24 1022  BP: 102/70   Roberto Reid is a 89 y.o. Male who presents today for his Annual Wellness Visit. Patient has Hypertension; Hyperlipidemia; Osteoarthritis; GE reflux; Erectile dysfunction; Atopic dermatitis; Gonalgia; H/O total knee replacement; DVT, lower extremity, proximal, acute, right (HCC); Chronic anticoagulation; History of pulmonary embolism; Foot deformity, acquired, right; Recurrent cellulitis of lower extremity; Insomnia; Encounter for chronic pain management; Panic disorder; Chronic kidney disease, stage IV (severe) (HCC); Controlled type 2 diabetes mellitus without complication, without long-term current use of insulin  (HCC); Arterial embolism of upper extremity (HCC); Failure of joint fusion; Acquired pes planovalgus, right; Midfoot collapse, right; Mallet toe of right foot; Skin ulcer of second toe of right foot with fat layer exposed (HCC); Impotence; AV block, 2nd degree; Nonrheumatic aortic valve stenosis; and Nonrheumatic tricuspid valve regurgitation on their problem list.  He said that he is doing fine and has no complaints.   Was seen on 02/21/2024 after he had a loss of consciousness while on vacation. He said he has not had another loss of consciousness. He was seen at the cardiologist, who found nothing abnormal.     History of Hypertension treated losartan  100 mg daily. Blood pressure today was normal at  102/70.    History of Chronic foot deformity; Secondary Chronic musculoskeletal/  right foot pain treated with Norco 10-325 every 6-8 hours as needed. Takes pain med reliably and responsibly. Norco does not eliminate the foot pain but helps.   History of Non-Rheumatic Aortic valve stenosis: treated with Eliquis  2.5 mg twice daily. Followed by [facility].  History of chronic insomnia treated with Ambien  10 mg  and Seroquel  50 mg at bedtime.    He had left lower lobe pneumonia in 2017 and developed shortness of breath.  He was hospitalized and found to have pulmonary embolism which was treated with Eliquis .  Hematologist has advised him to continue with anticoagulation indefinitely.   History of hearing loss.   History of dyshidrotic hand eczema.   History of allergy testing done in 2010 positive to cats and some molds.  Minimal reactivity to dog.  Spirometry was normal.  Cataract extraction both eyes November 2010.  Left knee surgery done by Dr. Melita for torn cartilage November 2005.  Hospitalized in [place]  September 2011 with right leg cellulitis treated with IV antibiotics.    History of elevated serum creatinine consistent with chronic kidney disease but has declined nephrology consultation.  He had ultrasound of the kidneys in 2017.  There was suspicion for mass in the interpolar region of the right kidney.  He had an MRI of the right kidney showing small bilateral renal cyst and no worrisome renal lesions.  Reasons for chronic kidney disease include hypertension and diabetes. 05/11/2024 BUN 31, Creatinine 1.5, eGFR 44   In the Spring 2016 while on a trip to [place] he suffered a fall down some marble steps in [place].  He also had right knee torn medial meniscus and had complex repair by Dr. Dorian in 2016.   Labs  05/11/2024 WBC 3.6, RBC 3.01, Hemoglobin 10.03, HCT 31.2, MCV 103.7, MCH 34.2, Blood glucose 101, BUN 31, Creatinine 1.50, eGFR 44, ALT 7, Otherwise WNL.   Vaccine counseling: Influenza vaccine received today, declined shingles vaccine.   Getting B12  injection once a week for 4 weeks.   Health Maintenance  Topic Date Due   Zoster Vaccines- Shingrix (1 of 2) 08/13/2024 (Originally 06/01/1984)   FOOT EXAM  06/06/2024   OPHTHALMOLOGY EXAM  06/09/2024   HEMOGLOBIN A1C  11/09/2024   Medicare Annual Wellness (AWV)  05/15/2025   DTaP/Tdap/Td (3 - Td or Tdap) 01/21/2032   Pneumococcal Vaccine: 50+ Years  Completed   Influenza Vaccine  Completed   Meningococcal B Vaccine  Aged Out   COVID-19 Vaccine  Discontinued    Review of Systems  Constitutional:  Negative for fever and malaise/fatigue.  HENT:  Negative for congestion.   Eyes:  Negative for blurred vision.  Respiratory:  Negative for cough and shortness of breath.   Cardiovascular:  Negative for chest pain, palpitations and leg swelling.  Gastrointestinal:  Negative for melena and vomiting.  Musculoskeletal:  Negative for back pain.  Skin:  Negative for rash.  Neurological:  Negative for loss of consciousness and headaches.   Objective:  Vitals: body mass index is 23.04 kg/m. Today's Vitals   05/15/24 1022  BP: 102/70  Pulse: 80  SpO2: 93%  Height: 5' 10 (1.778 m)  PainSc: 0-No pain   Physical Exam Vitals and nursing note reviewed. Exam conducted with a chaperone present Regena Keas, CMA).  Constitutional:      General: He is awake. He is not in acute distress.    Appearance: Normal appearance. He is not ill-appearing or toxic-appearing.  HENT:     Head: Normocephalic and atraumatic.     Right Ear: Tympanic membrane, ear canal and external ear normal.     Left Ear: Tympanic membrane, ear canal and external ear normal.     Mouth/Throat:     Pharynx: Oropharynx is clear.  Eyes:     Extraocular Movements: Extraocular movements intact.     Pupils: Pupils are equal, round, and reactive to light.  Neck:     Thyroid : No thyroid  mass, thyromegaly or thyroid  tenderness.     Vascular: No carotid bruit.  Cardiovascular:     Rate and Rhythm: Normal rate and regular  rhythm. No extrasystoles are present.    Pulses:          Dorsalis pedis pulses are 2+ on the right side and 2+ on the left side.       Posterior tibial pulses are 2+ on the right side and 2+ on the left side.     Heart sounds: Normal heart sounds. No murmur heard.    No friction rub. No gallop.  Pulmonary:     Effort: Pulmonary effort is normal.     Breath sounds: Normal breath sounds. No decreased breath sounds, wheezing, rhonchi or rales.  Chest:     Chest wall: No mass.  Abdominal:     Palpations: Abdomen is soft. There is no hepatomegaly, splenomegaly or mass.     Tenderness: There is no abdominal tenderness.     Hernia: No hernia is present.  Genitourinary:    Prostate: Normal. Not enlarged, not tender and no nodules present.     Rectum: Normal. Guaiac result negative.  Musculoskeletal:     Cervical back: Normal range of motion.     Right lower leg: No edema.  Left lower leg: No edema.  Lymphadenopathy:     Cervical: No cervical adenopathy.     Upper Body:     Right upper body: No supraclavicular adenopathy.     Left upper body: No supraclavicular adenopathy.  Skin:    General: Skin is warm and dry.     Findings: Abrasion present.     Comments: Abrasion on right upper arm.   Neurological:     General: No focal deficit present.     Mental Status: He is alert and oriented to person, place, and time. Mental status is at baseline.     Cranial Nerves: Cranial nerves 2-12 are intact.     Sensory: Sensation is intact.     Motor: Motor function is intact.     Coordination: Coordination is intact.     Gait: Gait is intact.     Deep Tendon Reflexes: Reflexes are normal and symmetric.  Psychiatric:        Attention and Perception: Attention normal.        Mood and Affect: Mood normal.        Speech: Speech normal.        Behavior: Behavior normal. Behavior is cooperative.        Thought Content: Thought content normal.        Cognition and Memory: Cognition and memory  normal.        Judgment: Judgment normal.     Current Outpatient Medications  Medication Instructions   apixaban  (ELIQUIS ) 2.5 mg, Oral, 2 times daily   clonazePAM  (KLONOPIN ) 0.5 MG tablet TAKE 1 TO 2 TABLETS BY MOUTH DAILY AS NEEDED FOR ANXIETY   Coenzyme Q10 (CO Q-10 PO) 1 tablet, Daily   esomeprazole  (NEXIUM ) 40 mg, Oral, 2 times daily   folic acid  (FOLVITE ) 1 mg, Oral, Daily   HYDROcodone -acetaminophen  (NORCO) 10-325 MG tablet One tab by mouth every 6-8 hours  for chronic pain   levofloxacin  (LEVAQUIN ) 500 mg, Oral, Daily   loratadine (CLARITIN) 10 mg, Daily PRN   losartan  (COZAAR ) 100 mg, Oral, Daily   Multiple Vitamins-Minerals (MULTIVITAMIN WITH MINERALS) tablet 1 tablet, Daily   Omega-3 Fatty Acids (OMEGA 3 PO) Take by mouth.   QUEtiapine  (SEROQUEL ) 50 mg, Oral, Daily at bedtime   sildenafil  (VIAGRA ) 50-100 mg, Oral, Daily PRN   Testosterone  20.25 mg, Daily   zolpidem  (AMBIEN ) 10 mg, Oral, Daily at bedtime   Past Medical History:  Diagnosis Date   Arthritis    right hand (06/15/2016)   Childhood asthma    Chronic anticoagulation 08/30/2017   DVT, lower extremity, proximal, acute, right (HCC) 08/24/2016   06/15/16   GERD (gastroesophageal reflux disease)    Hearing loss    History of kidney stones    Hyperlipidemia    Hypertension    Pneumonia    I've had it ~ 9 times as an adult; last time was 04/2016 (06/15/2016)   Medical/Surgical History Narrative:  Allergic/Intolerant to:  Allergies  Allergen Reactions   Oxycodone -Acetaminophen  Other (See Comments)    confusion   Percocet [Oxycodone -Acetaminophen ]     Wild hallucinations. He states he has tolerated plain oxycodone  in the past.    Sulfa Antibiotics    Sulfamethoxazole Rash    Past Surgical History:  Procedure Laterality Date   AMPUTATION TOE Right 09/27/2018   Procedure: Right 2nd toe amputation;  Surgeon: Kit Rush, MD;  Location: [facility];  Service: Orthopedics;  Laterality: Right;     CARPAL TUNNEL RELEASE Left  08/16/2020   Procedure: LEFT CARPAL TUNNEL RELEASE;  Surgeon: Murrell Drivers, MD;  Location: [facility];  Service: Orthopedics;  Laterality: Left;   CATARACT EXTRACTION W/ INTRAOCULAR LENS  IMPLANT, BILATERAL Bilateral 04/2009   ELBOW BURSA SURGERY Right    FOOT SURGERY Right 2010   attempted reconstruction per pt   JOINT REPLACEMENT     KIDNEY STONE SURGERY     KNEE ARTHROSCOPY Right 10/08/2014   Procedure: ARTHROSCOPY KNEE;  Surgeon: Marcey Raman, MD;  Location: [facility];  Service: Orthopedics;  Laterality: Right;   KNEE ARTHROSCOPY Left    repair torn knee cartilage   QUADRICEPS TENDON REPAIR Right 10/08/2014   Procedure: REPAIR QUADRICEP TENDON;  Surgeon: Marcey Raman, MD;  Location: [facility] OR;  Service: Orthopedics;  Laterality: Right;   TONSILLECTOMY     TOTAL KNEE ARTHROPLASTY  12/14/2011   Procedure: TOTAL KNEE ARTHROPLASTY;  Surgeon: Garnette JONETTA Raman, MD;  Location: [facility] OR;  Service: Orthopedics;  Laterality: Left;   Family History  Problem Relation Age of Onset   Parkinson's disease Father    Social History   Social History Narrative   Lives at home with wife   Right handed   Caffeine: 2 cups/day   Most Recent Health Risks Assessment:   Most Recent Social Determinants of Health (Including Hx of Tobacco, Alcohol, and Drug Use) SDOH Screenings   Food Insecurity: No Food Insecurity (05/15/2024)  Housing: Low Risk (05/15/2024)  Transportation Needs: No Transportation Needs (05/15/2024)  Utilities: Not At Risk (05/15/2024)  Alcohol Screen: Low Risk (05/15/2024)  Depression (PHQ2-9): Low Risk (05/15/2024)  Financial Resource Strain: Low Risk (05/15/2024)  Physical Activity: Insufficiently Active (05/15/2024)  Social Connections: Socially Integrated (05/15/2024)  Stress: No Stress Concern Present (05/15/2024)  Tobacco Use: Medium Risk (05/15/2024)  Health Literacy: Adequate Health Literacy (05/15/2024)   Social History   Tobacco  Use   Smoking status: Former    Current packs/day: 0.00    Average packs/day: 0.8 packs/day for 37.0 years (27.8 ttl pk-yrs)    Types: Cigarettes    Start date: 12/14/1954    Quit date: 12/14/1991    Years since quitting: 32.4   Smokeless tobacco: Never  Vaping Use   Vaping status: Never Used  Substance Use Topics   Alcohol use: Yes    Alcohol/week: 7.0 - 10.0 standard drinks of alcohol    Types: 7 - 10 Shots of liquor per week    Comment: 2 cocktails daily   Drug use: No     Most Recent Fall Risk Assessment:    05/15/2024   10:50 AM  Fall Risk   Falls in the past year? 0  Number falls in past yr: 0  Injury with Fall? 0  Risk for fall due to : No Fall Risks  Follow up Falls evaluation completed;Education provided;Falls prevention discussed   Most Recent Anxiety/Depression Screenings:    05/15/2024   11:28 AM 09/07/2023   10:22 AM  PHQ 2/9 Scores  PHQ - 2 Score 0 0      09/07/2023   10:22 AM  GAD 7 : Generalized Anxiety Score  Nervous, Anxious, on Edge 0  Control/stop worrying 0  Worry too much - different things 0  Trouble relaxing 0  Restless 0  Easily annoyed or irritable 0  Afraid - awful might happen 0  Total GAD 7 Score 0   Most Recent Cognitive Screening:    05/15/2024   10:50 AM  6CIT Screen  What Year? 4 points  What month? 0 points  What time? 3 points  Count back from 20 0 points  Months in reverse 0 points  Repeat phrase 0 points  Total Score 7 points    Results:  Studies Obtained And Personally Reviewed By Me:  Labs:  CBC w/ Differential Lab Results  Component Value Date   WBC 3.6 (L) 05/11/2024   RBC 3.01 (L) 05/11/2024   HGB 10.3 (L) 05/11/2024   HCT 31.2 (L) 05/11/2024   PLT 143 05/11/2024   MCV 103.7 (H) 05/11/2024   MCH 34.2 (H) 05/11/2024   MCHC 33.0 05/11/2024   RDW 13.2 05/11/2024   MPV 11.9 05/11/2024   LYMPHSABS 1,405 09/15/2022   MONOABS 0.3 02/22/2017   BASOSABS 22 05/11/2024    Comprehensive Metabolic Panel Lab  Results  Component Value Date   NA 140 05/11/2024   K 5.0 05/11/2024   CL 105 05/11/2024   CO2 29 05/11/2024   GLUCOSE 101 (H) 05/11/2024   BUN 31 (H) 05/11/2024   CREATININE 1.50 (H) 05/11/2024   CALCIUM  9.2 05/11/2024   PROT 7.0 05/11/2024   ALBUMIN  3.5 08/24/2016   AST 10 05/11/2024   ALT 7 (L) 05/11/2024   ALKPHOS 33 (L) 08/24/2016   BILITOT 1.1 05/11/2024   EGFR 44 (L) 05/11/2024   GFRNONAA 44 (L) 05/07/2020   Lipid Panel  Lab Results  Component Value Date   CHOL 141 05/11/2024   HDL 60 05/11/2024   LDLCALC 67 05/11/2024   TRIG 62 05/11/2024   A1c Lab Results  Component Value Date   HGBA1C 5.1 05/11/2024    TSH Lab Results  Component Value Date   TSH 3.78 02/21/2024   PSA 0.08 Assessment & Plan:   Meds ordered this encounter  Medications   cyanocobalamin  (VITAMIN B12) injection 1,000 mcg   Orders Placed This Encounter  Procedures   Flu vaccine HIGH DOSE PF(Fluzone Trivalent)    Was seen on 02/21/2024 after he had a loss of consciousness while on vacation. He said he has not had another loss of consciousness. He was seen by cardiologist, who felt this was not a serious event such as TIA or arrythmia. A vagal reaction was favored.      MCV of 103.7: MCH  of 34.2. He is to have a B12 injection once a week for 4 weeks.   B12 and folate collected today.   B12 injection received today. Continue monthly B12 injections.  Hypertension: treated with losartan  100 mg daily. Blood pressure today was normal at 102/70   Chronic foot deformity; Secondary Chronic musculoskeletal/ right foot pain: treated with Norco 10-325 every 6-8 hours as needed. Takes pain med reliably and responsibly. Norco does not eliminate the foot pain but helps.   Non-Rhumatic Aortic valve stenosis: treated with Eliquis  2.5 mg twice daily. Followed by Cardiologist.  Chronic insomnia: treated with Ambien  10 mg  and Seroquel  50 mg at bedtime.   Recent syncopal episode while traveling out of  state. Has been evaluated by Cardiology and not thought to be due to arrythmia   Vaccine counseling: Influenza vaccine received today, declined shingles vaccine  Have ordered Ct of abdomen and pelvis with and without contrast. Have placed  urgent Urology referral and  he may need cystoscopy.   Urine has large occult blood.  Urine looks  turbid rust colored urine but he is asymptomatic. Refer to Urology.  I had expected culture to be positive but  urine culture had no growth. CT abd and pelvis ordered.  Urgent Urology referral placed to evaluate abnormal turbid looking urine.     Annual Wellness Visit done today including the all of the following: Reviewed patient's Family Medical History Reviewed patient's SDOH and reviewed tobacco, alcohol, and drug use.  Reviewed and updated list of patient's medical providers Assessment of cognitive impairment was done Assessed patient's functional ability Established a written schedule for health screening services Health Risk Assessent Completed and Reviewed  Discussed health benefits of physical activity, and encouraged him to engage in regular exercise appropriate for his age and condition.    I,Makayla C Reid,acting as a scribe for Ronal JINNY Hailstone, MD.,have documented all relevant documentation on the behalf of Ronal JINNY Hailstone, MD,as directed by  Ronal JINNY Hailstone, MD while in the presence of Ronal JINNY Hailstone, MD.    I, Ronal JINNY Hailstone, MD, have reviewed all documentation for and agree with the above Annual Wellness Visit documentation.  Ronal JINNY Hailstone, MD Internal Medicine 05/15/2024 "

## 2024-05-11 ENCOUNTER — Other Ambulatory Visit: Payer: Self-pay

## 2024-05-11 DIAGNOSIS — Z0000 Encounter for general adult medical examination without abnormal findings: Secondary | ICD-10-CM

## 2024-05-11 DIAGNOSIS — I952 Hypotension due to drugs: Secondary | ICD-10-CM

## 2024-05-11 DIAGNOSIS — I10 Essential (primary) hypertension: Secondary | ICD-10-CM

## 2024-05-11 DIAGNOSIS — N189 Chronic kidney disease, unspecified: Secondary | ICD-10-CM

## 2024-05-11 DIAGNOSIS — E119 Type 2 diabetes mellitus without complications: Secondary | ICD-10-CM

## 2024-05-11 DIAGNOSIS — D649 Anemia, unspecified: Secondary | ICD-10-CM

## 2024-05-11 DIAGNOSIS — G8929 Other chronic pain: Secondary | ICD-10-CM

## 2024-05-11 DIAGNOSIS — Z13220 Encounter for screening for lipoid disorders: Secondary | ICD-10-CM

## 2024-05-11 DIAGNOSIS — D539 Nutritional anemia, unspecified: Secondary | ICD-10-CM

## 2024-05-11 DIAGNOSIS — I441 Atrioventricular block, second degree: Secondary | ICD-10-CM

## 2024-05-12 LAB — HEMOGLOBIN A1C
Hgb A1c MFr Bld: 5.1 % (ref <5.7)
Mean Plasma Glucose: 100 mg/dL
eAG (mmol/L): 5.5 mmol/L

## 2024-05-12 LAB — COMPREHENSIVE METABOLIC PANEL WITH GFR
AG Ratio: 1.6 (calc) (ref 1.0–2.5)
ALT: 7 U/L — ABNORMAL LOW (ref 9–46)
AST: 10 U/L (ref 10–35)
Albumin: 4.3 g/dL (ref 3.6–5.1)
Alkaline phosphatase (APISO): 41 U/L (ref 35–144)
BUN/Creatinine Ratio: 21 (calc) (ref 6–22)
BUN: 31 mg/dL — ABNORMAL HIGH (ref 7–25)
CO2: 29 mmol/L (ref 20–32)
Calcium: 9.2 mg/dL (ref 8.6–10.3)
Chloride: 105 mmol/L (ref 98–110)
Creat: 1.5 mg/dL — ABNORMAL HIGH (ref 0.70–1.22)
Globulin: 2.7 g/dL (ref 1.9–3.7)
Glucose, Bld: 101 mg/dL — ABNORMAL HIGH (ref 65–99)
Potassium: 5 mmol/L (ref 3.5–5.3)
Sodium: 140 mmol/L (ref 135–146)
Total Bilirubin: 1.1 mg/dL (ref 0.2–1.2)
Total Protein: 7 g/dL (ref 6.1–8.1)
eGFR: 44 mL/min/1.73m2 — ABNORMAL LOW (ref >=60)

## 2024-05-12 LAB — LIPID PANEL
Cholesterol: 141 mg/dL (ref <200)
HDL: 60 mg/dL (ref >=40)
LDL Cholesterol (Calc): 67 mg/dL
Non-HDL Cholesterol (Calc): 81 mg/dL (ref <130)
Total CHOL/HDL Ratio: 2.4 (calc) (ref <5.0)
Triglycerides: 62 mg/dL (ref <150)

## 2024-05-12 LAB — CBC WITH DIFFERENTIAL/PLATELET
Absolute Lymphocytes: 1350 {cells}/uL (ref 850–3900)
Absolute Monocytes: 245 {cells}/uL (ref 200–950)
Basophils Absolute: 22 {cells}/uL (ref 0–200)
Basophils Relative: 0.6 %
Eosinophils Absolute: 90 {cells}/uL (ref 15–500)
Eosinophils Relative: 2.5 %
HCT: 31.2 % — ABNORMAL LOW (ref 39.4–51.1)
Hemoglobin: 10.3 g/dL — ABNORMAL LOW (ref 13.2–17.1)
MCH: 34.2 pg — ABNORMAL HIGH (ref 27.0–33.0)
MCHC: 33 g/dL (ref 31.6–35.4)
MCV: 103.7 fL — ABNORMAL HIGH (ref 81.4–101.7)
MPV: 11.9 fL (ref 7.5–12.5)
Monocytes Relative: 6.8 %
Neutro Abs: 1894 {cells}/uL (ref 1500–7800)
Neutrophils Relative %: 52.6 %
Platelets: 143 Thousand/uL (ref 140–400)
RBC: 3.01 Million/uL — ABNORMAL LOW (ref 4.20–5.80)
RDW: 13.2 % (ref 11.0–15.0)
Total Lymphocyte: 37.5 %
WBC: 3.6 Thousand/uL — ABNORMAL LOW (ref 3.8–10.8)

## 2024-05-12 LAB — PSA: PSA: 0.08 ng/mL (ref <=4.00)

## 2024-05-12 NOTE — Telephone Encounter (Signed)
 done

## 2024-05-12 NOTE — Progress Notes (Signed)
Lab Only.

## 2024-05-15 ENCOUNTER — Ambulatory Visit: Payer: Self-pay | Admitting: Internal Medicine

## 2024-05-15 ENCOUNTER — Encounter: Payer: Self-pay | Admitting: Internal Medicine

## 2024-05-15 VITALS — BP 102/70 | HR 80 | Ht 70.0 in | Wt 160.0 lb

## 2024-05-15 DIAGNOSIS — F5104 Psychophysiologic insomnia: Secondary | ICD-10-CM

## 2024-05-15 DIAGNOSIS — M21961 Unspecified acquired deformity of right lower leg: Secondary | ICD-10-CM | POA: Diagnosis not present

## 2024-05-15 DIAGNOSIS — I361 Nonrheumatic tricuspid (valve) insufficiency: Secondary | ICD-10-CM

## 2024-05-15 DIAGNOSIS — I350 Nonrheumatic aortic (valve) stenosis: Secondary | ICD-10-CM

## 2024-05-15 DIAGNOSIS — Z86711 Personal history of pulmonary embolism: Secondary | ICD-10-CM

## 2024-05-15 DIAGNOSIS — D539 Nutritional anemia, unspecified: Secondary | ICD-10-CM

## 2024-05-15 DIAGNOSIS — I10 Essential (primary) hypertension: Secondary | ICD-10-CM | POA: Diagnosis not present

## 2024-05-15 DIAGNOSIS — Z23 Encounter for immunization: Secondary | ICD-10-CM | POA: Diagnosis not present

## 2024-05-15 DIAGNOSIS — N1832 Chronic kidney disease, stage 3b: Secondary | ICD-10-CM

## 2024-05-15 DIAGNOSIS — L309 Dermatitis, unspecified: Secondary | ICD-10-CM

## 2024-05-15 DIAGNOSIS — H9113 Presbycusis, bilateral: Secondary | ICD-10-CM

## 2024-05-15 DIAGNOSIS — I5189 Other ill-defined heart diseases: Secondary | ICD-10-CM

## 2024-05-15 DIAGNOSIS — Z7901 Long term (current) use of anticoagulants: Secondary | ICD-10-CM

## 2024-05-15 DIAGNOSIS — Z0000 Encounter for general adult medical examination without abnormal findings: Secondary | ICD-10-CM

## 2024-05-15 DIAGNOSIS — R310 Gross hematuria: Secondary | ICD-10-CM

## 2024-05-15 DIAGNOSIS — R82998 Other abnormal findings in urine: Secondary | ICD-10-CM

## 2024-05-15 DIAGNOSIS — G8929 Other chronic pain: Secondary | ICD-10-CM

## 2024-05-15 DIAGNOSIS — R52 Pain, unspecified: Secondary | ICD-10-CM

## 2024-05-15 DIAGNOSIS — Z87898 Personal history of other specified conditions: Secondary | ICD-10-CM

## 2024-05-15 DIAGNOSIS — M79671 Pain in right foot: Secondary | ICD-10-CM

## 2024-05-15 DIAGNOSIS — D649 Anemia, unspecified: Secondary | ICD-10-CM

## 2024-05-15 DIAGNOSIS — R319 Hematuria, unspecified: Secondary | ICD-10-CM

## 2024-05-15 DIAGNOSIS — I440 Atrioventricular block, first degree: Secondary | ICD-10-CM

## 2024-05-15 DIAGNOSIS — H905 Unspecified sensorineural hearing loss: Secondary | ICD-10-CM

## 2024-05-15 LAB — POCT URINALYSIS DIP (CLINITEK)
Bilirubin, UA: NEGATIVE
Glucose, UA: NEGATIVE mg/dL
Ketones, POC UA: NEGATIVE mg/dL
Nitrite, UA: POSITIVE — AB
POC PROTEIN,UA: 30 — AB
Spec Grav, UA: 1.015 (ref 1.010–1.025)
Urobilinogen, UA: 0.2 U/dL
pH, UA: 6 (ref 5.0–8.0)

## 2024-05-15 MED ORDER — CYANOCOBALAMIN 1000 MCG/ML IJ SOLN
1000.0000 ug | Freq: Once | INTRAMUSCULAR | Status: AC
  Administered 2024-05-15: 12:00:00 1000 ug via INTRAMUSCULAR

## 2024-05-15 NOTE — Patient Instructions (Addendum)
 He will get a B12 injection weekly for 4 weeks then we will repeat CBC with diff.  He may need to see Hematologist about anemia if not improving.Continue losartan  and Eliquis . Mr. Magwood,  Thank you for taking the time for your Medicare Wellness Visit. I appreciate your continued commitment to your health goals. Please review the care plan we discussed, and feel free to reach out if I can assist you further.  Please note that Annual Wellness Visits do not include a physical exam. Some assessments may be limited, especially if the visit was conducted virtually. If needed, we may recommend an in-person follow-up with your provider.  Ongoing Care Seeing your primary care provider every 3 to 6 months helps us  monitor your health and provide consistent, personalized care.   Referrals If a referral was made during today's visit and you haven't received any updates within two weeks, please contact the referred provider directly to check on the status.        05/15/2024   10:50 AM  Advanced Directives  Does Patient Have a Medical Advance Directive? Yes  Type of Advance Directive Living will  Does patient want to make changes to medical advance directive? No - Patient declined    Vision: Annual vision screenings are recommended for early detection of glaucoma, cataracts, and diabetic retinopathy. These exams can also reveal signs of chronic conditions such as diabetes and high blood pressure.  Dental: Annual dental screenings help detect early signs of oral cancer, gum disease, and other conditions linked to overall health, including heart disease and diabetes.  Please see the attached documents for additional preventive care recommendations.

## 2024-05-15 NOTE — Progress Notes (Unsigned)
 Chief Complaint  Patient presents with   Annual Exam   Medicare Wellness     Subjective:   Roberto Reid is a 89 y.o. male who presents for a Medicare Annual Wellness Visit.  Visit info / Clinical Intake: Medicare Wellness Visit Type:: Subsequent Annual Wellness Visit Persons participating in visit and providing information:: patient Medicare Wellness Visit Mode:: In-person (required for WTM) Interpreter Needed?: No Pre-visit prep was completed: yes AWV questionnaire completed by patient prior to visit?: no Living arrangements:: lives with spouse/significant other Patient's Overall Health Status Rating: excellent Typical amount of pain: none Does pain affect daily life?: no Are you currently prescribed opioids?: (!) yes  Dietary Habits and Nutritional Risks How many meals a day?: 3 Eats fruit and vegetables daily?: yes Most meals are obtained by: having others provide food; eating out In the last 2 weeks, have you had any of the following?: none Diabetic:: (!) yes Any non-healing wounds?: no How often do you check your BS?: 0 Would you like to be referred to a Nutritionist or for Diabetic Management? : no  Functional Status Activities of Daily Living (to include ambulation/medication): Independent Ambulation: Independent Medication Administration: Independent Home Management (perform basic housework or laundry): Independent Manage your own finances?: yes Primary transportation is: driving Concerns about vision?: no *vision screening is required for WTM* Concerns about hearing?: no  Fall Screening Falls in the past year?: 0 Number of falls in past year: 0 Was there an injury with Fall?: 0 Fall Risk Category Calculator: 0 Patient Fall Risk Level: Low Fall Risk  Fall Risk Patient at Risk for Falls Due to: No Fall Risks Fall risk Follow up: Falls evaluation completed; Education provided; Falls prevention discussed  Home and Transportation Safety: All rugs have  non-skid backing?: yes All stairs or steps have railings?: yes Grab bars in the bathtub or shower?: yes Have non-skid surface in bathtub or shower?: yes Good home lighting?: yes Regular seat belt use?: yes Hospital stays in the last year:: no  Cognitive Assessment Difficulty concentrating, remembering, or making decisions? : no Will 6CIT or Mini Cog be Completed: yes What year is it?: 4 points What month is it?: 0 points About what time is it?: 3 points Count backwards from 20 to 1: 0 points Say the months of the year in reverse: 0 points Repeat the address phrase from earlier: 0 points 6 CIT Score: 7 points  Advance Directives (For Healthcare) Does Patient Have a Medical Advance Directive?: Yes Does patient want to make changes to medical advance directive?: No - Patient declined Type of Advance Directive: Living will Copy of Living Will in Chart?: No - copy requested  Reviewed/Updated  Reviewed/Updated: Reviewed All (Medical, Surgical, Family, Medications, Allergies, Care Teams, Patient Goals)    Allergies (verified) Oxycodone -acetaminophen , Percocet [oxycodone -acetaminophen ], Sulfa antibiotics, and Sulfamethoxazole   Current Medications (verified) Outpatient Encounter Medications as of 05/15/2024  Medication Sig   apixaban  (ELIQUIS ) 2.5 MG TABS tablet Take 1 tablet (2.5 mg total) by mouth 2 (two) times daily.   clonazePAM  (KLONOPIN ) 0.5 MG tablet TAKE 1 TO 2 TABLETS BY MOUTH DAILY AS NEEDED FOR ANXIETY   Coenzyme Q10 (CO Q-10 PO) Take 1 tablet by mouth daily. 300 mg   esomeprazole  (NEXIUM ) 40 MG capsule Take 1 capsule (40 mg total) by mouth 2 (two) times daily.   folic acid  (FOLVITE ) 1 MG tablet Take 1 tablet (1 mg total) by mouth daily.   HYDROcodone -acetaminophen  (NORCO) 10-325 MG tablet One tab by mouth every  6-8 hours  for chronic pain   levofloxacin  (LEVAQUIN ) 500 MG tablet Take 1 tablet (500 mg total) by mouth daily.   loratadine (CLARITIN) 10 MG tablet Take 10 mg  by mouth daily as needed for allergies.   losartan  (COZAAR ) 100 MG tablet Take 1 tablet (100 mg total) by mouth daily.   Multiple Vitamins-Minerals (MULTIVITAMIN WITH MINERALS) tablet Take 1 tablet by mouth daily.   Omega-3 Fatty Acids (OMEGA 3 PO) Take by mouth.   QUEtiapine  (SEROQUEL ) 50 MG tablet Take 1 tablet (50 mg total) by mouth at bedtime.   sildenafil  (VIAGRA ) 100 MG tablet Take 0.5-1 tablets (50-100 mg total) by mouth daily as needed for erectile dysfunction.   Testosterone  20.25 MG/ACT (1.62%) GEL 20.25 mg by Pump Prime route daily. Apply 6 pumps everyday as directed.   zolpidem  (AMBIEN ) 10 MG tablet Take 1 tablet (10 mg total) by mouth at bedtime.   [EXPIRED] cyanocobalamin  (VITAMIN B12) injection 1,000 mcg    No facility-administered encounter medications on file as of 05/15/2024.    History: Past Medical History:  Diagnosis Date   Arthritis    right hand (06/15/2016)   Childhood asthma    Chronic anticoagulation 08/30/2017   DVT, lower extremity, proximal, acute, right (HCC) 08/24/2016   06/15/16   GERD (gastroesophageal reflux disease)    Hearing loss    History of kidney stones    Hyperlipidemia    Hypertension    Pneumonia    I've had it ~ 9 times as an adult; last time was 04/2016 (06/15/2016)   Past Surgical History:  Procedure Laterality Date   AMPUTATION TOE Right 09/27/2018   Procedure: Right 2nd toe amputation;  Surgeon: Kit Rush, MD;  Location: [facility];  Service: Orthopedics;  Laterality: Right;    CARPAL TUNNEL RELEASE Left 08/16/2020   Procedure: LEFT CARPAL TUNNEL RELEASE;  Surgeon: Murrell Drivers, MD;  Location: [facility];  Service: Orthopedics;  Laterality: Left;   CATARACT EXTRACTION W/ INTRAOCULAR LENS  IMPLANT, BILATERAL Bilateral 04/2009   ELBOW BURSA SURGERY Right    FOOT SURGERY Right 2010   attempted reconstruction per pt   JOINT REPLACEMENT     KIDNEY STONE SURGERY     KNEE ARTHROSCOPY Right  10/08/2014   Procedure: ARTHROSCOPY KNEE;  Surgeon: Marcey Raman, MD;  Location[facility];  Service: Orthopedics;  Laterality: Right;   KNEE ARTHROSCOPY Left    repair torn knee cartilage   QUADRICEPS TENDON REPAIR Right 10/08/2014   Procedure: REPAIR QUADRICEP TENDON;  Surgeon: Marcey Raman, MD;  Location: [facility] OR;  Service: Orthopedics;  Laterality: Right;   TONSILLECTOMY     TOTAL KNEE ARTHROPLASTY  12/14/2011   Procedure: TOTAL KNEE ARTHROPLASTY;  Surgeon: Garnette JONETTA Raman, MD;  Location: [facility] OR;  Service: Orthopedics;  Laterality: Left;   Family History  Problem Relation Age of Onset   Parkinson's disease Father    Social History   Occupational History   Not on file  Tobacco Use   Smoking status: Former    Current packs/day: 0.00    Average packs/day: 0.8 packs/day for 37.0 years (27.8 ttl pk-yrs)    Types: Cigarettes    Start date: 12/14/1954    Quit date: 12/14/1991    Years since quitting: 32.4   Smokeless tobacco: Never  Vaping Use   Vaping status: Never Used  Substance and Sexual Activity   Alcohol use: Yes    Alcohol/week: 7.0 - 10.0 standard drinks of alcohol  Types: 7 - 10 Shots of liquor per week    Comment: 2 cocktails daily   Drug use: No   Sexual activity: Yes   Tobacco Counseling Counseling given: No  SDOH Screenings   Food Insecurity: No Food Insecurity (05/15/2024)  Housing: Low Risk (05/15/2024)  Transportation Needs: No Transportation Needs (05/15/2024)  Utilities: Not At Risk (05/15/2024)  Alcohol Screen: Low Risk (05/15/2024)  Depression (PHQ2-9): Low Risk (05/15/2024)  Financial Resource Strain: Low Risk (05/15/2024)  Physical Activity: Insufficiently Active (05/15/2024)  Social Connections: Socially Integrated (05/15/2024)  Stress: No Stress Concern Present (05/15/2024)  Tobacco Use: Medium Risk (05/15/2024)  Health Literacy: Adequate Health Literacy (05/15/2024)   See flowsheets for full screening details  Depression Screen PHQ 2 & 9 Depression  Scale- Over the past 2 weeks, how often have you been bothered by any of the following problems? Little interest or pleasure in doing things: 0 Feeling down, depressed, or hopeless (PHQ Adolescent also includes...irritable): 0 PHQ-2 Total Score: 0     Goals Addressed               This Visit's Progress     Activity and Exercise Increased (pt-stated)        Patient would like to exercise more.             Objective:    Today's Vitals   05/15/24 1022  BP: 102/70  Pulse: 80  SpO2: 93%  Height: 5' 10 (1.778 m)  PainSc: 0-No pain   Body mass index is 23.04 kg/m.  Hearing/Vision screen Vision Screening - Comments:: Patient states his last eye exam was about 6 months ago.  Immunizations and Health Maintenance Health Maintenance  Topic Date Due   Influenza Vaccine  12/31/2023   Zoster Vaccines- Shingrix (1 of 2) 08/13/2024 (Originally 06/01/1984)   FOOT EXAM  06/06/2024   OPHTHALMOLOGY EXAM  06/09/2024   HEMOGLOBIN A1C  11/09/2024   Medicare Annual Wellness (AWV)  05/15/2025   DTaP/Tdap/Td (3 - Td or Tdap) 01/21/2032   Pneumococcal Vaccine: 50+ Years  Completed   Meningococcal B Vaccine  Aged Out   COVID-19 Vaccine  Discontinued        Assessment/Plan:  This is a routine wellness examination for Roberto Reid.  Patient Care Team: Perri Ronal PARAS, MD as PCP - General (Internal Medicine) Lynwood Schilling, MD as PCP - Cardiology (Cardiology) Freddie Lynwood HERO, MD as Consulting Physician (Oncology) Gearline Norris, MD as Consulting Physician (Nephrology)  I have personally reviewed and noted the following in the patients chart:   Medical and social history Use of alcohol, tobacco or illicit drugs  Current medications and supplements including opioid prescriptions. Functional ability and status Nutritional status Physical activity Advanced directives List of other physicians Hospitalizations, surgeries, and ER visits in previous 12 months Vitals Screenings to  include cognitive, depression, and falls Referrals and appointments  No orders of the defined types were placed in this encounter.  In addition, I have reviewed and discussed with patient certain preventive protocols, quality metrics, and best practice recommendations. A written personalized care plan for preventive services as well as general preventive health recommendations were provided to patient.   Kavitha Lansdale, CMA   05/15/2024   Return in about 4 weeks (around 06/12/2024).  After Visit Summary: (In Person-Printed) AVS printed and given to the patient  Nurse Notes: none

## 2024-05-15 NOTE — Telephone Encounter (Signed)
 Done

## 2024-05-16 ENCOUNTER — Encounter: Payer: Self-pay | Admitting: Internal Medicine

## 2024-05-16 LAB — URINALYSIS, ROUTINE W REFLEX MICROSCOPIC
Bacteria, UA: NONE SEEN /HPF
Bilirubin Urine: NEGATIVE
Glucose, UA: NEGATIVE
Ketones, ur: NEGATIVE
Nitrite: NEGATIVE
Specific Gravity, Urine: 1.019 (ref 1.001–1.035)
pH: 5 (ref 5.0–8.0)

## 2024-05-16 LAB — URINE CULTURE
MICRO NUMBER:: 17355887
Result:: NO GROWTH
SPECIMEN QUALITY:: ADEQUATE

## 2024-05-16 LAB — MICROSCOPIC MESSAGE

## 2024-05-16 NOTE — Telephone Encounter (Signed)
 done

## 2024-05-19 ENCOUNTER — Encounter: Payer: Self-pay | Admitting: Internal Medicine

## 2024-05-19 ENCOUNTER — Telehealth: Payer: Self-pay | Admitting: Internal Medicine

## 2024-05-19 DIAGNOSIS — G8929 Other chronic pain: Secondary | ICD-10-CM

## 2024-05-19 MED ORDER — HYDROCODONE-ACETAMINOPHEN 10-325 MG PO TABS: ORAL_TABLET | ORAL | 0 refills | Status: DC

## 2024-05-19 NOTE — Telephone Encounter (Signed)
 Refill Norco 10/325 #90 tablets for chronic pain.

## 2024-05-19 NOTE — Addendum Note (Signed)
 Addended by: PERRI RONAL PARAS on: 05/19/2024 03:55 PM   Modules accepted: Orders

## 2024-05-20 DIAGNOSIS — I350 Nonrheumatic aortic (valve) stenosis: Secondary | ICD-10-CM | POA: Diagnosis not present

## 2024-05-20 DIAGNOSIS — I4510 Unspecified right bundle-branch block: Secondary | ICD-10-CM | POA: Diagnosis not present

## 2024-05-23 ENCOUNTER — Ambulatory Visit

## 2024-05-23 NOTE — Addendum Note (Signed)
 Addended by: PERRI RONAL PARAS on: 05/23/2024 05:21 PM   Modules accepted: Level of Service

## 2024-05-24 NOTE — Addendum Note (Signed)
 Addended by: PERRI RONAL PARAS on: 05/24/2024 04:36 PM   Modules accepted: Orders, Level of Service

## 2024-05-26 ENCOUNTER — Ambulatory Visit
Admission: RE | Admit: 2024-05-26 | Discharge: 2024-05-26 | Disposition: A | Discharge status: Home / Self Care | Source: Ambulatory Visit | Attending: Internal Medicine | Admitting: Internal Medicine

## 2024-05-26 MED ORDER — IOPAMIDOL (ISOVUE-300) INJECTION 61%
80.0000 mL | Freq: Once | INTRAVENOUS | Status: AC | PRN
  Administered 2024-05-26: 80 mL via INTRAVENOUS

## 2024-05-28 ENCOUNTER — Ambulatory Visit: Payer: Self-pay | Admitting: Cardiology

## 2024-05-28 DIAGNOSIS — I4510 Unspecified right bundle-branch block: Secondary | ICD-10-CM | POA: Diagnosis not present

## 2024-05-28 DIAGNOSIS — I350 Nonrheumatic aortic (valve) stenosis: Secondary | ICD-10-CM | POA: Diagnosis not present

## 2024-05-28 DIAGNOSIS — I361 Nonrheumatic tricuspid (valve) insufficiency: Secondary | ICD-10-CM | POA: Diagnosis not present

## 2024-05-31 ENCOUNTER — Ambulatory Visit (HOSPITAL_COMMUNITY)
Admission: RE | Admit: 2024-05-31 | Discharge: 2024-05-31 | Disposition: A | Discharge status: Home / Self Care | Source: Ambulatory Visit | Attending: Cardiology | Admitting: Cardiology

## 2024-05-31 DIAGNOSIS — I4510 Unspecified right bundle-branch block: Secondary | ICD-10-CM | POA: Insufficient documentation

## 2024-05-31 DIAGNOSIS — I350 Nonrheumatic aortic (valve) stenosis: Secondary | ICD-10-CM | POA: Diagnosis not present

## 2024-05-31 DIAGNOSIS — E785 Hyperlipidemia, unspecified: Secondary | ICD-10-CM | POA: Insufficient documentation

## 2024-05-31 DIAGNOSIS — I361 Nonrheumatic tricuspid (valve) insufficiency: Secondary | ICD-10-CM | POA: Diagnosis not present

## 2024-05-31 LAB — ECHOCARDIOGRAM COMPLETE
AR max vel: 1.55 cm2
AV Area VTI: 1.62 cm2
AV Area mean vel: 1.56 cm2
AV Mean grad: 15.3 mmHg
AV Peak grad: 28.2 mmHg
Ao pk vel: 2.65 m/s
Area-P 1/2: 2 cm2
S' Lateral: 2.51 cm

## 2024-06-05 ENCOUNTER — Other Ambulatory Visit: Payer: Self-pay

## 2024-06-05 MED ORDER — LOSARTAN POTASSIUM 100 MG PO TABS: 100.0000 mg | ORAL_TABLET | Freq: Every day | ORAL | 3 refills | Status: AC

## 2024-06-09 ENCOUNTER — Other Ambulatory Visit: Payer: Self-pay

## 2024-06-09 MED ORDER — AMLODIPINE BESYLATE 2.5 MG PO TABS: 2.5000 mg | ORAL_TABLET | Freq: Every day | ORAL | 1 refills | Status: DC

## 2024-06-12 ENCOUNTER — Other Ambulatory Visit: Payer: Self-pay | Admitting: Urology

## 2024-06-12 ENCOUNTER — Telehealth: Payer: Self-pay | Admitting: Cardiology

## 2024-06-12 NOTE — Telephone Encounter (Signed)
"  ° °  Pre-operative Risk Assessment    Patient Name: Roberto Reid  DOB: [birth year] MRN: [id number]   Date of last office visit: 04/21/24 Date of next office visit: 10/19/2024   Request for Surgical Clearance    Procedure:  Resection of Bladder Tumor  Date of Surgery:  Clearance 07/18/24                                Surgeon:  Dr. Norleen Seltzer Surgeon's Group or Practice Name:  [facility] Phone number:  [phone]  Fax number:  [phone]   Type of Clearance Requested:   - Medical  - Pharmacy:  Hold Apixaban  (Eliquis )     Type of Anesthesia:  General    Additional requests/questions:     SignedHerma KATHEE Blush   06/12/2024, 2:22 PM   "

## 2024-06-12 NOTE — Telephone Encounter (Signed)
 Called and left the patient a detailed message to call back to schedule pre-op clearance. 1st attempt.

## 2024-06-12 NOTE — Telephone Encounter (Signed)
" ° °  Name: Roberto Reid  DOB: February 23, 1933  MRN: [id number]  Primary Cardiologist: Lynwood Schilling, MD   Preoperative team, please contact this patient and set up a phone call appointment for further preoperative risk assessment. Please obtain consent and complete medication review. Thank you for your help.  I confirm that guidance regarding antiplatelet and oral anticoagulation therapy has been completed and, if necessary, noted below.  Eliquis  is managed by Dr. Ronal JINNY Hailstone  I also confirmed the patient resides in the state of [place] . As per [facility] Medical Board telemedicine laws, the patient must reside in the state in which the provider is licensed.   Lamarr Satterfield, NP 06/12/2024, 2:30 PM [facility]    "

## 2024-06-13 ENCOUNTER — Encounter: Payer: Self-pay | Admitting: Cardiology

## 2024-06-13 NOTE — Telephone Encounter (Signed)
 2nd attempt to reach pt regarding surgical clearance and the need for an TELE appointment.  Left pt a detailed message to call back and get that scheduled.

## 2024-06-14 NOTE — Telephone Encounter (Signed)
 Yes ma'am, that is fine. Thank you!

## 2024-06-14 NOTE — Telephone Encounter (Signed)
 I will send FYI to preop APP pt has scheduled in office appt with Dr. Lavona 06/29/24. Procedure is 07/18/24. Will confirm in office appt 06/29/24 suitable for preop.

## 2024-06-16 ENCOUNTER — Telehealth: Payer: Self-pay | Admitting: Internal Medicine

## 2024-06-16 ENCOUNTER — Encounter: Payer: Self-pay | Admitting: Internal Medicine

## 2024-06-16 DIAGNOSIS — R310 Gross hematuria: Secondary | ICD-10-CM

## 2024-06-16 DIAGNOSIS — N3289 Other specified disorders of bladder: Secondary | ICD-10-CM

## 2024-06-16 DIAGNOSIS — G8929 Other chronic pain: Secondary | ICD-10-CM

## 2024-06-16 MED ORDER — HYDROCODONE-ACETAMINOPHEN 10-325 MG PO TABS: ORAL_TABLET | ORAL | 0 refills | Status: AC

## 2024-06-16 NOTE — Telephone Encounter (Signed)
 He continues to take chronic pain medication responsibly for musculoskeletal pain. Seeing Urology for gross hematuria. Endoluminal mass right aspect of bladder found on CT of abdomen and pelvis. Seeing Dr. Norleen Seltzer. Refill pain med today.

## 2024-06-23 ENCOUNTER — Ambulatory Visit: Admitting: Internal Medicine

## 2024-06-23 ENCOUNTER — Encounter: Payer: Self-pay | Admitting: Internal Medicine

## 2024-06-23 VITALS — BP 120/80 | HR 74 | Temp 98.2°F | Ht 70.0 in | Wt 164.0 lb

## 2024-06-23 DIAGNOSIS — M216X1 Other acquired deformities of right foot: Secondary | ICD-10-CM | POA: Diagnosis not present

## 2024-06-23 DIAGNOSIS — F5104 Psychophysiologic insomnia: Secondary | ICD-10-CM | POA: Diagnosis not present

## 2024-06-23 DIAGNOSIS — E538 Deficiency of other specified B group vitamins: Secondary | ICD-10-CM

## 2024-06-23 DIAGNOSIS — K219 Gastro-esophageal reflux disease without esophagitis: Secondary | ICD-10-CM

## 2024-06-23 DIAGNOSIS — F411 Generalized anxiety disorder: Secondary | ICD-10-CM | POA: Diagnosis not present

## 2024-06-23 DIAGNOSIS — E785 Hyperlipidemia, unspecified: Secondary | ICD-10-CM

## 2024-06-23 DIAGNOSIS — D7589 Other specified diseases of blood and blood-forming organs: Secondary | ICD-10-CM

## 2024-06-23 DIAGNOSIS — I10 Essential (primary) hypertension: Secondary | ICD-10-CM

## 2024-06-23 DIAGNOSIS — L03115 Cellulitis of right lower limb: Secondary | ICD-10-CM | POA: Diagnosis not present

## 2024-06-23 DIAGNOSIS — D539 Nutritional anemia, unspecified: Secondary | ICD-10-CM

## 2024-06-23 DIAGNOSIS — N183 Chronic kidney disease, stage 3 unspecified: Secondary | ICD-10-CM | POA: Diagnosis not present

## 2024-06-23 DIAGNOSIS — D494 Neoplasm of unspecified behavior of bladder: Secondary | ICD-10-CM

## 2024-06-23 DIAGNOSIS — N1832 Chronic kidney disease, stage 3b: Secondary | ICD-10-CM

## 2024-06-23 DIAGNOSIS — Z7901 Long term (current) use of anticoagulants: Secondary | ICD-10-CM

## 2024-06-23 DIAGNOSIS — Z86711 Personal history of pulmonary embolism: Secondary | ICD-10-CM

## 2024-06-23 LAB — CBC WITH DIFFERENTIAL/PLATELET
Absolute Lymphocytes: 1810 {cells}/uL (ref 850–3900)
Absolute Monocytes: 322 {cells}/uL (ref 200–950)
Basophils Absolute: 10 {cells}/uL (ref 0–200)
Basophils Relative: 0.2 %
Eosinophils Absolute: 91 {cells}/uL (ref 15–500)
Eosinophils Relative: 1.9 %
HCT: 30.5 % — ABNORMAL LOW (ref 39.4–51.1)
Hemoglobin: 10.2 g/dL — ABNORMAL LOW (ref 13.2–17.1)
MCH: 34 pg — ABNORMAL HIGH (ref 27.0–33.0)
MCHC: 33.4 g/dL (ref 31.6–35.4)
MCV: 101.7 fL (ref 81.4–101.7)
MPV: 12.6 fL — ABNORMAL HIGH (ref 7.5–12.5)
Monocytes Relative: 6.7 %
Neutro Abs: 2568 {cells}/uL (ref 1500–7800)
Neutrophils Relative %: 53.5 %
Platelets: 130 Thousand/uL — ABNORMAL LOW (ref 140–400)
RBC: 3 Million/uL — ABNORMAL LOW (ref 4.20–5.80)
RDW: 13.2 % (ref 11.0–15.0)
Total Lymphocyte: 37.7 %
WBC: 4.8 Thousand/uL (ref 3.8–10.8)

## 2024-06-23 MED ORDER — CEFTRIAXONE SODIUM 1 G IJ SOLR
1.0000 g | Freq: Once | INTRAMUSCULAR | Status: AC
  Administered 2024-06-23: 1 g via INTRAMUSCULAR

## 2024-06-23 MED ORDER — LEVOFLOXACIN 500 MG PO TABS: 500.0000 mg | ORAL_TABLET | Freq: Every day | ORAL | 0 refills | Status: AC

## 2024-06-23 NOTE — Progress Notes (Addendum)
 "   Patient Care Team: Uel Davidow, Ronal PARAS, MD as PCP - General (Internal Medicine) Lavona Agent, MD as PCP - Cardiology (Cardiology) Freddie Agent HERO, MD as Consulting Physician (Oncology) Gearline Norris, MD as Consulting Physician (Nephrology)  Visit Date: 06/23/24  Subjective:    Patient ID: Roberto Reid , Male   DOB: [birth year], 89 y.o.    MRN: [id number]   89 y.o. Male presents today for sore on foot. Patient has a past medical history of Hypertension, Non-rheumatic valve stenosis, chronic insomnia.  He has a  painful right foot. I do not see an active ulcer. No draiange from foot. He said that it causing him pain and that pain medication is not helping pain. He said that his foot feels numb. Physical exam revealed it is warm to touch.   History of Chronic foot deformity; Secondary Chronic musculoskeletal/ right foot pain treated with Norco 10-325 every 6-8 hours as needed. He developed pes planovalgus and hallux valgus deformities. Takes pain med reliably and responsibly. Norco does not eliminate the foot pain but helps.     On 09/27/2018 he had a  severe bunion deformity of his right foot and had had an ulcer at the second toe for many months. He had an MRI of the forefoot showing osteomyelitis of the distal phalanx. He hd a right second toe amputation.   In January of 2018 he had a Right lower extremity DVT and low Volume Bilateral Pulmonary Emboli.   He developed gross hematuria recently.  And a  CT of abdomen and pelvis detected a small enhancing endoluminal mass right aspect of bladder measuring 1.0 x 0.8 cm suspicious for malignancy. Patient has seen Dr. Norleen Seltzer who will be performing surgery in the near future pending pre-op clearance from Dr. Lavona and me.  He has HTN and hyperlipemia. Remote hx of DVT and pneumonia.   Past Medical History:  Diagnosis Date   Arthritis    right hand (06/15/2016)   Childhood asthma    Chronic anticoagulation 08/30/2017   DVT,  lower extremity, proximal, acute, right (HCC) 08/24/2016   06/15/16   GERD (gastroesophageal reflux disease)    Hearing loss    History of kidney stones    Hyperlipidemia    Hypertension    Pneumonia    I've had it ~ 9 times as an adult; last time was 04/2016 (06/15/2016)     Family History  Problem Relation Age of Onset   Parkinson's disease Father     Social History   Social History Narrative   Lives at home with wife   Right handed   Caffeine: 2 cups/day      ROS: no fever, shaking chill, nausea or vomiting. Has  rightfoot pain.      Objective:   Vitals: BP 120/80   Pulse 74   Temp 98.2 F (36.8 C)   Ht 5' 10 (1.778 m)   Wt 164 lb (74.4 kg)   SpO2 96%   BMI 23.53 kg/m    Physical Exam Musculoskeletal:     Right foot: Deformity present.  Feet:     Right foot:     Skin integrity: Skin breakdown and warmth present.       Results:   Studies obtained and personally reviewed by me:   Labs: CBC today WBC is 4800, Hgb 10.2 grams and platelet count 130,000. Has had anemia since Fall 2025. WBC has been mostly normal but was 3600 in the Fall. Now 4800. MCV has  increased to 103.7 in December. B12 and folate were normal in September. B12 was low normal at 511 and folate was greater than 24. Needs to repeat these values with retic count. I am concerned about pancytopenia. I am recommending Hematology evaluation. I am treating him for cellulitis right foot today.      Component Value Date/Time   NA 140 05/11/2024 1248   NA 138 08/23/2017 1101   K 5.0 05/11/2024 1248   CL 105 05/11/2024 1248   CO2 29 05/11/2024 1248   GLUCOSE 101 (H) 05/11/2024 1248   BUN 31 (H) 05/11/2024 1248   BUN 34 (H) 08/23/2017 1101   CREATININE 1.50 (H) 05/11/2024 1248   CALCIUM  9.2 05/11/2024 1248   PROT 7.0 05/11/2024 1248   ALBUMIN  3.5 08/24/2016 1519   AST 10 05/11/2024 1248   ALT 7 (L) 05/11/2024 1248   ALKPHOS 33 (L) 08/24/2016 1519   BILITOT 1.1 05/11/2024 1248    GFRNONAA 44 (L) 05/07/2020 1635   GFRAA 51 (L) 05/07/2020 1635     Lab Results  Component Value Date   WBC 3.6 (L) 05/11/2024   HGB 10.3 (L) 05/11/2024   HCT 31.2 (L) 05/11/2024   MCV 103.7 (H) 05/11/2024   PLT 143 05/11/2024    Lab Results  Component Value Date   CHOL 141 05/11/2024   HDL 60 05/11/2024   LDLCALC 67 05/11/2024   TRIG 62 05/11/2024   CHOLHDL 2.4 05/11/2024    Lab Results  Component Value Date   HGBA1C 5.1 05/11/2024     Lab Results  Component Value Date   TSH 3.78 02/21/2024     Lab Results  Component Value Date   PSA 0.08 05/11/2024   PSA 0.51 02/21/2024   PSA 0.42 03/03/2022     Assessment & Plan:   Orders Placed This Encounter  Procedures   CBC with Differential/Platelet   Meds ordered this encounter  Medications   cefTRIAXone  (ROCEPHIN ) injection 1 g   levofloxacin  (LEVAQUIN ) 500 MG tablet    Sig: Take 1 tablet (500 mg total) by mouth daily for 7 days.    Dispense:  10 tablet    Refill:  0    Cellulitis of right foot: He has an ulcerative lesion on his right foot. He said that it causing him pain and that pain medication hasn't worked to improve it. He said that his foot feels numb. Physical exam revealed it is warm to touch.  He has hx of chronic foot deformity. Has chronic foot pain and is on chronic narcotic medication. Takes pain med reliably and responsibly.Has been evaluated   CBC w/ diff collected today. WBC is 4800, Hgb 10.2 grams, MCV 101.7 and platelets 130,000.  Rocephin  1 g IM injection received today.    Levaquin  500 mg daily for 7 days prescribed. Patient is to advise me if not improving in 48 hours or sooner if worse.  Chronic foot deformity; Secondary Chronic musculoskeletal/ right foot pain: treated with Norco 10-325 every 6-8 hours as needed. He developed pes planovalgus and hallux valgus deformities. Takes pain med reliably and responsibly. Norco does not eliminate the foot pain but helps. Had right second toe  amputation due to osteomyelitis. Severe bunion deformity of right foot. Dr. Kit performed amputation right second toe for osteomyelitis distal phalanx right second toe. He has an old Lisfranc fracture subluxation with instability of fist and second metatarsal joints causing chronic pain.  Hx of recurrent cellulitis of right leg. Hx spider bite right  leg in 2008 but likely has venous insufficiency and minor trauma to leg causes cellulitis. He has flare-ups from time to time requiring antibiotic treatment.  Essential HTN-stable on amlodipine  and losartan   GERD treated with Nexium   Folate deficiency treated with Folvite   Hyperlipidemia treated with Lipitor .  Chronic kidney disease stage 3  with recent creatinine in December  of 1.5  and est GFR 44 cc/min  Hx DVT treated with Eliquis  2.5 mg daily  Chronic insomnia treated with Ambien  and Seroquel . Longstanding hx of insomnia related to [place].  Anxiety treated with Klonopin .  Macrocytic anemia - B12 was 511  when checked 4 months ago and was 747  when checked 1 year ago. Folate was 2.9 one year ago and greater than 24 4 months ago.  Needs pre-op clearance from Cardiology. Also am Suggesting Hematology consult as soon as possible  .Also, patient  is scheduled for Cardiology evaluation.   I,Makayla C Reid,acting as a scribe for Ronal JINNY Hailstone, MD.,have documented all relevant documentation on the behalf of Ronal JINNY Hailstone, MD,as directed by  Ronal JINNY Hailstone, MD while in the presence of Ronal JINNY Hailstone, MD.  I, Ronal JINNY Hailstone, MD, have reviewed all documentation for this visit. The documentation on 06/23/2024 for the exam, diagnosis, procedures, and orders are all accurate and complete.     "

## 2024-06-24 ENCOUNTER — Ambulatory Visit: Payer: Self-pay | Admitting: Internal Medicine

## 2024-06-25 NOTE — Patient Instructions (Addendum)
 I am concerned about elevated MCV and Hgb 10.2 grams. B12 was 511 in September. Folate was low a year ago. Is taking Folvite  and MVI. Suggest Hematology consult

## 2024-06-27 NOTE — Progress Notes (Unsigned)
 " Cardiology Office Note:   Date:  06/29/2024  ID:  Roberto Reid, DOB 04/02/1933, MRN [id number] PCP: Perri Ronal PARAS, MD  [facility] Providers Cardiologist:  Lynwood Schilling, MD {  History of Present Illness:   Roberto Reid is a 89 y.o. male with moderate aortic stenosis.  She has a history of RBBB.  He has moderate aortic stenosis he is a bradycardia.  He had first-degree AV block with bifascicular block and history of SVT and was evaluated previously by electrophysiologist in 2024 but not thought to require pacing.   Since had an echo in Nov 2025 she had an echo with mild to mod TR.  There was moderate calcium  on the aortic valve.  There was mild stenosis with a gradient of 15.3.  The aorta was 39 mm.   He returns today for follow up.  He has done well.  He lives with his wife.  He is a former [employer].  He has 2 children 1 lives in [place] and 1 who lives in [place].  He does all his driving and grocery shopping.  He still travels his snow.  He denies any cardiovascular symptoms.  The patient denies any new symptoms such as chest discomfort, neck or arm discomfort. There has been no new shortness of breath, PND or orthopnea. There have been no reported palpitations, presyncope or syncope.   ROS: As stated in the HPI and negative for all other systems.  Studies Reviewed:    EKG:   EKG Interpretation Date/Time:  Thursday June 29 2024 14:37:42 EST Ventricular Rate:  76 PR Interval:  248 QRS Duration:  150 QT Interval:  414 QTC Calculation: 465 R Axis:   80  Text Interpretation: Sinus rhythm with 1st degree A-V block Right bundle branch block When compared with ECG of 21-Apr-2024 08:53, Premature atrial complexes are no longer Present Nonspecific T wave abnormality now evident in Inferior leads Confirmed by Schilling Rattan ([id number]) on 06/29/2024 3:06:47 PM   Risk Assessment/Calculations:              Physical Exam:   VS:  BP (!) 102/54 (BP Location: Left Arm,  Patient Position: Sitting, Cuff Size: Normal)   Pulse 75   Ht 5' 10 (1.778 m)   Wt 165 lb 6.4 oz (75 kg)   SpO2 97%   BMI 23.73 kg/m    Wt Readings from Last 3 Encounters:  06/29/24 165 lb 6.4 oz (75 kg)  06/23/24 164 lb (74.4 kg)  05/15/24 160 lb (72.6 kg)     GEN: Well nourished, well developed in no acute distress NECK: No JVD; No carotid bruits CARDIAC: RRR, 2 out of 6 apical systolic murmur radiating slightly at the aortic outflow tract, no diastolic murmurs, rubs, gallops RESPIRATORY:  Clear to auscultation without rales, wheezing or rhonchi  ABDOMEN: Soft, non-tender, non-distended EXTREMITIES:  No edema; No deformity   ASSESSMENT AND PLAN:   Moderate aortic stenosis: This is mild to moderate.  No change in therapy.  I will follow this clinically.  Moderate tricuspid regurgitation:   He has no symptoms related to this.  No change in therapy.  Right bundle branch block and first degree heart block:   He had some lightheadedness few years ago at [facility] but has not had any symptoms since then.  No change in therapy.  Hyperlipidemia: LDL was 67.  No change in therapy.    Follow up with me in one year.   Signed,  Lynwood Schilling, MD   "

## 2024-06-29 ENCOUNTER — Encounter: Payer: Self-pay | Admitting: Cardiology

## 2024-06-29 ENCOUNTER — Ambulatory Visit: Attending: Cardiology | Admitting: Cardiology

## 2024-06-29 VITALS — BP 102/54 | HR 75 | Ht 70.0 in | Wt 165.4 lb

## 2024-06-29 DIAGNOSIS — I361 Nonrheumatic tricuspid (valve) insufficiency: Secondary | ICD-10-CM | POA: Diagnosis not present

## 2024-06-29 DIAGNOSIS — I4510 Unspecified right bundle-branch block: Secondary | ICD-10-CM

## 2024-06-29 DIAGNOSIS — I350 Nonrheumatic aortic (valve) stenosis: Secondary | ICD-10-CM | POA: Diagnosis not present

## 2024-06-29 DIAGNOSIS — E785 Hyperlipidemia, unspecified: Secondary | ICD-10-CM

## 2024-06-29 NOTE — Patient Instructions (Signed)

## 2024-07-06 ENCOUNTER — Ambulatory Visit: Admitting: Internal Medicine

## 2024-07-06 ENCOUNTER — Encounter: Payer: Self-pay | Admitting: Internal Medicine

## 2024-07-06 VITALS — BP 100/60 | HR 74 | Temp 98.2°F | Ht 70.0 in | Wt 165.0 lb

## 2024-07-06 DIAGNOSIS — N1832 Chronic kidney disease, stage 3b: Secondary | ICD-10-CM

## 2024-07-06 DIAGNOSIS — N3289 Other specified disorders of bladder: Secondary | ICD-10-CM

## 2024-07-06 DIAGNOSIS — I440 Atrioventricular block, first degree: Secondary | ICD-10-CM

## 2024-07-06 DIAGNOSIS — I350 Nonrheumatic aortic (valve) stenosis: Secondary | ICD-10-CM

## 2024-07-06 DIAGNOSIS — D539 Nutritional anemia, unspecified: Secondary | ICD-10-CM

## 2024-07-06 DIAGNOSIS — Z01818 Encounter for other preprocedural examination: Secondary | ICD-10-CM

## 2024-07-06 DIAGNOSIS — I5189 Other ill-defined heart diseases: Secondary | ICD-10-CM

## 2024-07-06 DIAGNOSIS — H905 Unspecified sensorineural hearing loss: Secondary | ICD-10-CM

## 2024-07-06 DIAGNOSIS — Z86711 Personal history of pulmonary embolism: Secondary | ICD-10-CM

## 2024-07-06 DIAGNOSIS — M21961 Unspecified acquired deformity of right lower leg: Secondary | ICD-10-CM

## 2024-07-06 DIAGNOSIS — I10 Essential (primary) hypertension: Secondary | ICD-10-CM

## 2024-07-06 DIAGNOSIS — G8929 Other chronic pain: Secondary | ICD-10-CM

## 2024-07-06 DIAGNOSIS — Z7901 Long term (current) use of anticoagulants: Secondary | ICD-10-CM

## 2024-07-06 DIAGNOSIS — F5104 Psychophysiologic insomnia: Secondary | ICD-10-CM

## 2024-07-06 DIAGNOSIS — F1190 Opioid use, unspecified, uncomplicated: Secondary | ICD-10-CM

## 2024-07-07 ENCOUNTER — Encounter: Payer: Self-pay | Admitting: Internal Medicine

## 2024-07-07 NOTE — Patient Instructions (Addendum)
 Patient is cleared for Urology surgery. Detailed note prepared. Fax to [facility]. Has had Cardiac clearance with Dr. Lavona. Needs to stop  Eliquis  2 (TWO) days in advance of surgery.

## 2024-07-18 ENCOUNTER — Ambulatory Visit (HOSPITAL_COMMUNITY): Admit: 2024-07-18 | Admitting: Urology

## 2024-07-18 SURGERY — TURBT, WITH CHEMOTHERAPEUTIC AGENT INSTILLATION INTO BLADDER
Anesthesia: General

## 2024-10-19 ENCOUNTER — Ambulatory Visit: Admitting: Cardiology
